# Patient Record
Sex: Female | Born: 1970
Health system: Southern US, Community
[De-identification: ages and names within clinical notes are randomized; demographics above are authoritative.]

## PROBLEM LIST (undated history)

## (undated) DIAGNOSIS — I10 Essential (primary) hypertension: Secondary | ICD-10-CM

## (undated) DIAGNOSIS — F329 Major depressive disorder, single episode, unspecified: Secondary | ICD-10-CM

## (undated) DIAGNOSIS — D649 Anemia, unspecified: Secondary | ICD-10-CM

## (undated) DIAGNOSIS — L732 Hidradenitis suppurativa: Secondary | ICD-10-CM

## (undated) DIAGNOSIS — J45909 Unspecified asthma, uncomplicated: Secondary | ICD-10-CM

## (undated) DIAGNOSIS — E785 Hyperlipidemia, unspecified: Secondary | ICD-10-CM

## (undated) DIAGNOSIS — R7303 Prediabetes: Secondary | ICD-10-CM

## (undated) DIAGNOSIS — N946 Dysmenorrhea, unspecified: Secondary | ICD-10-CM

## (undated) DIAGNOSIS — M1990 Unspecified osteoarthritis, unspecified site: Secondary | ICD-10-CM

## (undated) DIAGNOSIS — F419 Anxiety disorder, unspecified: Secondary | ICD-10-CM

## (undated) DIAGNOSIS — F32A Depression, unspecified: Secondary | ICD-10-CM

## (undated) DIAGNOSIS — K5790 Diverticulosis of intestine, part unspecified, without perforation or abscess without bleeding: Secondary | ICD-10-CM

## (undated) DIAGNOSIS — F410 Panic disorder [episodic paroxysmal anxiety] without agoraphobia: Secondary | ICD-10-CM

## (undated) HISTORY — PX: WISDOM TOOTH EXTRACTION: SHX21

## (undated) HISTORY — DX: Anxiety disorder, unspecified: F41.9

## (undated) HISTORY — DX: Major depressive disorder, single episode, unspecified: F32.9

## (undated) HISTORY — DX: Hyperlipidemia, unspecified: E78.5

## (undated) HISTORY — PX: PELVIC LAPAROSCOPY: SHX162

## (undated) HISTORY — PX: TOE SURGERY: SHX1073

## (undated) HISTORY — PX: OTHER SURGICAL HISTORY: SHX169

## (undated) HISTORY — DX: Dysmenorrhea, unspecified: N94.6

## (undated) HISTORY — DX: Depression, unspecified: F32.A

## (undated) HISTORY — DX: Unspecified osteoarthritis, unspecified site: M19.90

## (undated) HISTORY — DX: Diverticulosis of intestine, part unspecified, without perforation or abscess without bleeding: K57.90

## (undated) HISTORY — PX: FOOT SURGERY: SHX648

## (undated) HISTORY — DX: Hidradenitis suppurativa: L73.2

---
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
## 1998-08-01 ENCOUNTER — Inpatient Hospital Stay (HOSPITAL_COMMUNITY): Admission: AD | Admit: 1998-08-01 | Discharge: 1998-08-01 | Payer: Self-pay | Admitting: Gynecology

## 1998-11-04 ENCOUNTER — Inpatient Hospital Stay (HOSPITAL_COMMUNITY): Admission: AD | Admit: 1998-11-04 | Discharge: 1998-11-04 | Payer: Self-pay | Admitting: Obstetrics and Gynecology

## 1998-12-07 ENCOUNTER — Encounter: Admission: RE | Admit: 1998-12-07 | Discharge: 1998-12-07 | Payer: Self-pay | Admitting: *Deleted

## 1999-05-24 ENCOUNTER — Inpatient Hospital Stay (HOSPITAL_COMMUNITY): Admission: AD | Admit: 1999-05-24 | Discharge: 1999-05-24 | Payer: Self-pay | Admitting: Gynecology

## 1999-12-24 ENCOUNTER — Inpatient Hospital Stay (HOSPITAL_COMMUNITY): Admission: AD | Admit: 1999-12-24 | Discharge: 1999-12-24 | Payer: Self-pay | Admitting: *Deleted

## 2002-01-07 ENCOUNTER — Inpatient Hospital Stay (HOSPITAL_COMMUNITY): Admission: AD | Admit: 2002-01-07 | Discharge: 2002-01-07 | Payer: Self-pay | Admitting: Gynecology

## 2002-03-15 ENCOUNTER — Encounter: Payer: Self-pay | Admitting: Emergency Medicine

## 2002-03-15 ENCOUNTER — Emergency Department (HOSPITAL_COMMUNITY): Admission: EM | Admit: 2002-03-15 | Discharge: 2002-03-15 | Payer: Self-pay | Admitting: Emergency Medicine

## 2003-04-18 ENCOUNTER — Ambulatory Visit (HOSPITAL_COMMUNITY): Admission: RE | Admit: 2003-04-18 | Discharge: 2003-04-18 | Payer: Self-pay | Admitting: Obstetrics and Gynecology

## 2003-10-04 ENCOUNTER — Emergency Department (HOSPITAL_COMMUNITY): Admission: EM | Admit: 2003-10-04 | Discharge: 2003-10-04 | Payer: Self-pay

## 2003-12-08 ENCOUNTER — Inpatient Hospital Stay (HOSPITAL_COMMUNITY): Admission: AD | Admit: 2003-12-08 | Discharge: 2003-12-08 | Payer: Self-pay | Admitting: Family Medicine

## 2004-01-16 ENCOUNTER — Emergency Department (HOSPITAL_COMMUNITY): Admission: EM | Admit: 2004-01-16 | Discharge: 2004-01-16 | Payer: Self-pay

## 2004-05-05 ENCOUNTER — Inpatient Hospital Stay (HOSPITAL_COMMUNITY): Admission: AD | Admit: 2004-05-05 | Discharge: 2004-05-05 | Payer: Self-pay | Admitting: Obstetrics and Gynecology

## 2004-05-06 ENCOUNTER — Inpatient Hospital Stay (HOSPITAL_COMMUNITY): Admission: AD | Admit: 2004-05-06 | Discharge: 2004-05-06 | Payer: Self-pay | Admitting: *Deleted

## 2004-06-20 ENCOUNTER — Emergency Department (HOSPITAL_COMMUNITY): Admission: EM | Admit: 2004-06-20 | Discharge: 2004-06-20 | Payer: Self-pay | Admitting: Emergency Medicine

## 2005-04-09 ENCOUNTER — Inpatient Hospital Stay (HOSPITAL_COMMUNITY): Admission: AD | Admit: 2005-04-09 | Discharge: 2005-04-09 | Payer: Self-pay | Admitting: Obstetrics and Gynecology

## 2005-04-11 ENCOUNTER — Inpatient Hospital Stay (HOSPITAL_COMMUNITY): Admission: AD | Admit: 2005-04-11 | Discharge: 2005-04-11 | Payer: Self-pay | Admitting: Obstetrics and Gynecology

## 2005-09-24 ENCOUNTER — Inpatient Hospital Stay (HOSPITAL_COMMUNITY): Admission: AD | Admit: 2005-09-24 | Discharge: 2005-09-24 | Payer: Self-pay | Admitting: *Deleted

## 2005-09-28 ENCOUNTER — Ambulatory Visit: Payer: Self-pay | Admitting: Nurse Practitioner

## 2005-10-11 ENCOUNTER — Ambulatory Visit: Payer: Self-pay | Admitting: Obstetrics & Gynecology

## 2006-03-06 ENCOUNTER — Ambulatory Visit: Payer: Self-pay | Admitting: Nurse Practitioner

## 2006-03-07 ENCOUNTER — Ambulatory Visit: Payer: Self-pay | Admitting: *Deleted

## 2006-06-13 ENCOUNTER — Emergency Department (HOSPITAL_COMMUNITY): Admission: EM | Admit: 2006-06-13 | Discharge: 2006-06-14 | Payer: Self-pay | Admitting: Emergency Medicine

## 2006-10-05 ENCOUNTER — Other Ambulatory Visit: Admission: RE | Admit: 2006-10-05 | Discharge: 2006-10-05 | Payer: Self-pay | Admitting: Obstetrics & Gynecology

## 2008-02-24 ENCOUNTER — Inpatient Hospital Stay (HOSPITAL_COMMUNITY): Admission: AD | Admit: 2008-02-24 | Discharge: 2008-02-24 | Payer: Self-pay | Admitting: Obstetrics & Gynecology

## 2008-04-09 ENCOUNTER — Ambulatory Visit: Payer: Self-pay | Admitting: Internal Medicine

## 2008-09-03 ENCOUNTER — Ambulatory Visit: Payer: Self-pay | Admitting: Internal Medicine

## 2008-11-20 ENCOUNTER — Inpatient Hospital Stay (HOSPITAL_COMMUNITY): Admission: AD | Admit: 2008-11-20 | Discharge: 2008-11-20 | Payer: Self-pay | Admitting: Obstetrics and Gynecology

## 2008-11-20 ENCOUNTER — Ambulatory Visit: Payer: Self-pay | Admitting: Obstetrics and Gynecology

## 2008-11-25 ENCOUNTER — Ambulatory Visit: Payer: Self-pay | Admitting: Family Medicine

## 2008-11-25 ENCOUNTER — Encounter (INDEPENDENT_AMBULATORY_CARE_PROVIDER_SITE_OTHER): Payer: Self-pay | Admitting: Family Medicine

## 2008-11-25 ENCOUNTER — Encounter (INDEPENDENT_AMBULATORY_CARE_PROVIDER_SITE_OTHER): Payer: Self-pay | Admitting: Internal Medicine

## 2008-11-25 LAB — CONVERTED CEMR LAB
CO2: 25 meq/L (ref 19–32)
Chlamydia, DNA Probe: NEGATIVE
Chloride: 106 meq/L (ref 96–112)
Creatinine, Ser: 0.94 mg/dL (ref 0.40–1.20)
Glucose, Bld: 86 mg/dL (ref 70–99)
Mumps IgG: 1.54 — ABNORMAL HIGH
Potassium: 4.4 meq/L (ref 3.5–5.3)
Rubella: 24.6 intl units/mL — ABNORMAL HIGH
Sodium: 139 meq/L (ref 135–145)
Total Bilirubin: 0.5 mg/dL (ref 0.3–1.2)

## 2008-11-28 ENCOUNTER — Ambulatory Visit: Payer: Self-pay | Admitting: Internal Medicine

## 2009-01-01 ENCOUNTER — Ambulatory Visit: Payer: Self-pay | Admitting: Internal Medicine

## 2009-03-16 ENCOUNTER — Ambulatory Visit: Payer: Self-pay | Admitting: Family Medicine

## 2009-03-24 ENCOUNTER — Ambulatory Visit: Payer: Self-pay | Admitting: Family Medicine

## 2009-05-18 ENCOUNTER — Ambulatory Visit: Payer: Self-pay | Admitting: Internal Medicine

## 2009-08-26 ENCOUNTER — Ambulatory Visit: Payer: Self-pay | Admitting: Internal Medicine

## 2009-08-26 ENCOUNTER — Encounter (INDEPENDENT_AMBULATORY_CARE_PROVIDER_SITE_OTHER): Payer: Self-pay | Admitting: Internal Medicine

## 2009-08-26 LAB — CONVERTED CEMR LAB
Basophils Absolute: 0 10*3/uL (ref 0.0–0.1)
Basophils Relative: 0 % (ref 0–1)
Chlamydia, DNA Probe: NEGATIVE
Eosinophils Absolute: 0.2 10*3/uL (ref 0.0–0.7)
Eosinophils Relative: 4 % (ref 0–5)
GC Probe Amp, Genital: NEGATIVE
HCT: 39.8 % (ref 36.0–46.0)
Hep B E Ab: NEGATIVE
Lymphocytes Relative: 45 % (ref 12–46)
MCHC: 31.9 g/dL (ref 30.0–36.0)
Monocytes Absolute: 0.4 10*3/uL (ref 0.1–1.0)
Monocytes Relative: 8 % (ref 3–12)
Platelets: 212 10*3/uL (ref 150–400)

## 2009-09-02 ENCOUNTER — Ambulatory Visit: Payer: Self-pay | Admitting: Internal Medicine

## 2009-12-21 ENCOUNTER — Emergency Department (HOSPITAL_COMMUNITY): Admission: EM | Admit: 2009-12-21 | Discharge: 2009-12-21 | Payer: Self-pay | Admitting: Emergency Medicine

## 2010-07-02 ENCOUNTER — Ambulatory Visit: Payer: Self-pay | Admitting: Physician Assistant

## 2010-07-02 ENCOUNTER — Inpatient Hospital Stay (HOSPITAL_COMMUNITY): Admission: AD | Admit: 2010-07-02 | Discharge: 2010-07-03 | Payer: Self-pay | Admitting: Obstetrics & Gynecology

## 2010-12-07 ENCOUNTER — Encounter (INDEPENDENT_AMBULATORY_CARE_PROVIDER_SITE_OTHER): Payer: Self-pay | Admitting: *Deleted

## 2010-12-09 ENCOUNTER — Ambulatory Visit (HOSPITAL_COMMUNITY)
Admission: RE | Admit: 2010-12-09 | Discharge: 2010-12-09 | Payer: Self-pay | Source: Home / Self Care | Attending: Family Medicine | Admitting: Family Medicine

## 2011-01-15 ENCOUNTER — Encounter: Payer: Self-pay | Admitting: Internal Medicine

## 2011-03-13 LAB — URINE CULTURE: Colony Count: 10000

## 2011-03-13 LAB — URINALYSIS, ROUTINE W REFLEX MICROSCOPIC
Hgb urine dipstick: NEGATIVE
Urobilinogen, UA: 0.2 mg/dL (ref 0.0–1.0)
pH: 6.5 (ref 5.0–8.0)

## 2011-03-13 LAB — WET PREP, GENITAL

## 2011-05-13 NOTE — Group Therapy Note (Signed)
NAMEKAYREN, HOLCK             ACCOUNT NO.:  [id number]   MEDICAL RECORD NO.:  [id number]          PATIENT TYPE:  WOC   LOCATION:  [facility]                   FACILITY:  [facility]   PHYSICIAN:  Elsie Lincoln, MD      DATE OF BIRTH:  1971-02-01   DATE OF SERVICE:                                    CLINIC NOTE   CHIEF COMPLAINT:  Followup for incision and drainage of a Bartholin's cyst  in [facility] on September 24, 2005.   SUBJECTIVE:  This is a 40 year old who was evaluated and diagnosed with a  sebaceous bulbar abscess on September 24, 2005 with I&D, and patient to  complete Keflex 500 mg q.i.d. prescribed for 5 days' treatment who comes in  today to check on the area, and also complaining of white vaginal discharge.  No itching.  No odor.  No burning sensation to voiding.   ALLERGIES:  Codeine gives her shortness of breath.   IMMUNIZATIONS:  The patient has had chickenpox, and tetanus has been  updated.   GYNECOLOGICAL HISTORY:  Last menstrual period on September 06, 2005.  Menarche at 40 years old with regular cycles.  The patient is not using any  contraceptives at this moment.  She is not sexually active.  Last Pap smear  in 2004.  Patient has a scheduled appointment ____________ tomorrow for a  Pap smear, GC and chlamydia.  Last mammogram in 2004.   OBSTETRICAL HISTORY:  G2, para 0-2-0 and 1 stillborn at 28 weeks'.   FAMILY HISTORY:  Mother with diabetes mellitus.  Father with high blood  pressure.  Grandfather with cancer of pancreas and liver, and grandmother  with a history of blood clots.   OBJECTIVE:  VITAL SIGNS:  Listed on the chart and within normal limits.  GENERAL:  In no acute distress.  Pleasant, well dressed and kept lady.  GENITOURINARY:  External genitalia within normal limits.  There is a small  area on the left thigh lateral to major labia with scar tissue and point of  inundated area without any discharge, slightly tender to palpation, but no  erythema or  discharge.  No drainage.  Wet prep obtained.   ASSESSMENT:  78.  A 40 year old African-American female status post incision and drainage      of bulbar abscess, completing antibiotic treatment.  2.  Bacterial vaginosis.   PLAN:  MetroGel for bacterial vaginosis prescribed since the patient is  intolerant of Flagyl.  Cream to be used 1 application at q.h.s. per vagina  for 5-day course.  Patient instructed to start application tomorrow at night  after Pap smear obtained.  No followup required for cyst.   DICTATED BY:  This is ____________ dictating an office visit for Dr.  Penne Lash.           ______________________________  Elsie Lincoln, MD     KL/MEDQ  D:  10/11/2005  T:  10/11/2005  Job:  [id number]

## 2011-06-17 ENCOUNTER — Inpatient Hospital Stay (HOSPITAL_COMMUNITY)
Admission: AD | Admit: 2011-06-17 | Discharge: 2011-06-17 | Disposition: A | Discharge status: Home / Self Care | Payer: Self-pay | Source: Ambulatory Visit | Attending: Obstetrics & Gynecology | Admitting: Obstetrics & Gynecology

## 2011-06-17 DIAGNOSIS — L732 Hidradenitis suppurativa: Secondary | ICD-10-CM

## 2011-06-17 DIAGNOSIS — A599 Trichomoniasis, unspecified: Secondary | ICD-10-CM | POA: Insufficient documentation

## 2011-06-17 LAB — URINALYSIS, ROUTINE W REFLEX MICROSCOPIC
Hgb urine dipstick: NEGATIVE
Nitrite: NEGATIVE
Urobilinogen, UA: 0.2 mg/dL (ref 0.0–1.0)

## 2011-06-17 LAB — URINE MICROSCOPIC-ADD ON

## 2011-06-17 LAB — POCT PREGNANCY, URINE: Preg Test, Ur: NEGATIVE

## 2011-09-19 LAB — DIFFERENTIAL
Basophils Relative: 0
Eosinophils Relative: 3
Monocytes Absolute: 0.6
Monocytes Relative: 9
Neutro Abs: 3.8

## 2011-09-19 LAB — WET PREP, GENITAL: Yeast Wet Prep HPF POC: NONE SEEN

## 2011-09-19 LAB — URINALYSIS, ROUTINE W REFLEX MICROSCOPIC
Bilirubin Urine: NEGATIVE
Glucose, UA: NEGATIVE
Hgb urine dipstick: NEGATIVE
Specific Gravity, Urine: 1.015
Urobilinogen, UA: 0.2

## 2011-09-19 LAB — CBC
HCT: 35.3 — ABNORMAL LOW
Platelets: 226
RBC: 3.94
RDW: 13.4
WBC: 6.5

## 2011-09-19 LAB — POCT PREGNANCY, URINE
Operator id: 28886
Preg Test, Ur: NEGATIVE

## 2011-11-22 ENCOUNTER — Encounter: Payer: Self-pay | Admitting: *Deleted

## 2011-11-22 ENCOUNTER — Emergency Department (HOSPITAL_COMMUNITY): Payer: Self-pay

## 2011-11-22 ENCOUNTER — Emergency Department (HOSPITAL_COMMUNITY)
Admission: EM | Admit: 2011-11-22 | Discharge: 2011-11-22 | Disposition: A | Discharge status: Home / Self Care | Payer: Self-pay | Attending: Emergency Medicine | Admitting: Emergency Medicine

## 2011-11-22 DIAGNOSIS — R079 Chest pain, unspecified: Secondary | ICD-10-CM | POA: Insufficient documentation

## 2011-11-22 DIAGNOSIS — R0789 Other chest pain: Secondary | ICD-10-CM

## 2011-11-22 DIAGNOSIS — R071 Chest pain on breathing: Secondary | ICD-10-CM | POA: Insufficient documentation

## 2011-11-22 MED ORDER — CYCLOBENZAPRINE HCL 10 MG PO TABS: 5.0000 mg | ORAL_TABLET | Freq: Three times a day (TID) | ORAL | Status: AC | PRN

## 2011-11-22 NOTE — ED Notes (Signed)
Pt reports L side cp on and off x 1 year, was never seen for it.  Pt reports pain started today while driving.  Pt denies any SOB or nausea at present.  Pt reports pain is worse with inhalation and with palpation.

## 2011-11-22 NOTE — ED Provider Notes (Signed)
History     CSN: [id number] Arrival date & time: 11/22/2011  1:18 PM   First MD Initiated Contact with Patient 11/22/11 1425      Chief Complaint  Patient presents with  . Chest Pain    (Consider location/radiation/quality/duration/timing/severity/associated sxs/prior treatment) Patient is a 40 y.o. female presenting with chest pain. The history is provided by the patient.  Chest Pain Episode onset: 1 year ago, with most recent episode lasting for last 2 weeks. Chest pain occurs intermittently. The chest pain is worsening. Associated with: movement, deep breathing. The severity of the pain is severe. The quality of the pain is described as sharp. The pain does not radiate. Chest pain is worsened by certain positions and deep breathing. Pertinent negatives for primary symptoms include no fever, no fatigue, no syncope, no shortness of breath, no cough, no palpitations and no abdominal pain. Primary symptoms comment: a few episodes of N/V when pain is severe but none today  Pertinent negatives for associated symptoms include no claudication, no diaphoresis, no lower extremity edema, no near-syncope, no numbness, no orthopnea, no paroxysmal nocturnal dyspnea and no weakness. Treatments tried: acetaminophen. Risk factors include no known risk factors.  Pertinent negatives for past medical history include no anxiety/panic attacks, no CAD, no diabetes, no DVT, no hyperlipidemia, no hypertension, no PE and no sickle cell disease.  Pertinent negatives for family medical history include: family history of aortic dissection, no early MI in family and no PE in family.   Pt with no recent prolonged road trip, no OCP use, no recent swelling or pain to LE.  History reviewed. No pertinent past medical history.  Past Surgical History  Procedure Date  . Abdominal exploration surgery     No family history on file.  History  Substance Use Topics  . Smoking status: Not on file  . Smokeless tobacco:  Not on file  . Alcohol Use: No     Review of Systems  Constitutional: Negative for fever, diaphoresis and fatigue.  Respiratory: Negative for cough and shortness of breath.   Cardiovascular: Positive for chest pain. Negative for palpitations, orthopnea, claudication, syncope and near-syncope.  Gastrointestinal: Negative for abdominal pain.  Neurological: Negative for weakness and numbness.  All other systems reviewed and are negative.    Allergies  Codeine  Home Medications   Current Outpatient Rx  Name Route Sig Dispense Refill  . ACETAMINOPHEN 650 MG RE SUPP Rectal Place 650 mg rectally every 8 (eight) hours as needed. Pain      . CEPHALEXIN 500 MG PO CAPS Oral Take 500 mg by mouth 4 (four) times daily.      Marland Kitchen VALACYCLOVIR HCL 500 MG PO TABS Oral Take 500 mg by mouth 2 (two) times daily.        BP 118/68  Pulse 66  Temp(Src) 98.4 F (36.9 C) (Oral)  Resp 18  Wt 130 lb (58.968 kg)  SpO2 100%  LMP 11/01/2011  Physical Exam  Nursing note and vitals reviewed. Constitutional: She is oriented to person, place, and time. She appears well-developed and well-nourished. No distress.  HENT:  Head: Normocephalic and atraumatic.  Right Ear: External ear normal.  Left Ear: External ear normal.  Mouth/Throat: Oropharynx is clear and moist.  Eyes: Conjunctivae and EOM are normal. Pupils are equal, round, and reactive to light.  Neck: Normal range of motion. Neck supple. No JVD present.  Cardiovascular: Normal rate, regular rhythm, normal heart sounds and intact distal pulses.   Pulmonary/Chest: Effort normal and  breath sounds normal. No respiratory distress. She exhibits tenderness.  Abdominal: Soft. Bowel sounds are normal. She exhibits no distension. There is no tenderness.  Musculoskeletal: Normal range of motion. She exhibits no edema.       Arms: Neurological: She is alert and oriented to person, place, and time. No cranial nerve deficit. Coordination normal.  Skin: Skin  is warm and dry. No rash noted. She is not diaphoretic.  Psychiatric: She has a normal mood and affect. Her behavior is normal.    ED Course  Procedures (including critical care time)  Labs Reviewed - No data to display Dg Chest 2 View  11/22/2011  *RADIOLOGY REPORT*  Clinical Data: Chest pain.  CHEST - 2 VIEW  Comparison: Chest x-ray 06/13/2006.  Findings: The cardiac silhouette, mediastinal and hilar contours are within normal limits and stable. The lungs are clear.  No pleural effusions.  The bony thorax is intact.  IMPRESSION: Normal chest x-ray.  Original Report Authenticated By: P. Loralie Champagne, M.D.     Date: 11/22/2011  Rate: 65  Rhythm: sinus  QRS Axis: normal  Intervals: normal  ST/T Wave abnormalities: normal  Conduction Disutrbances:Possible RVH with RSR' in V2  Narrative Interpretation:   Old EKG Reviewed: none available    1. Left-sided chest wall pain       MDM  39 year old F with 1 year intermittent sharp left-sided CP. No known CAD risk factors. Pain is reproducible. No hypoxia, tachycardia, recent prolonged trip, or other factors to suggest PE. Suspect musculoskeletal pain, will give rx for muscle relaxer and advise PCP follow-up.        Elwyn Reach [place], [place] 11/22/11 [phone]

## 2011-11-22 NOTE — ED Notes (Signed)
Pt verbalized understanding of plan of care and was walked to the d/c window.

## 2011-11-22 NOTE — ED Notes (Signed)
Pt reports L side upper chest pain x 1 hour today.  Pt reports having this same pain on and off x 1 year and was never seen.  Pt describes pain as sharp shooting pain.  Denies SOB or nausea at this time.  Pt reports pain started while driving.  Pain reports pain is worse when taking a deep breath and with palpation.

## 2011-12-03 NOTE — ED Provider Notes (Signed)
Medical screening examination/treatment/procedure(s) were performed by non-physician practitioner and as supervising physician I was immediately available for consultation/collaboration.   Suzi Roots, MD 12/03/11 [phone]

## 2012-02-23 ENCOUNTER — Other Ambulatory Visit: Payer: Self-pay

## 2012-02-23 ENCOUNTER — Emergency Department (HOSPITAL_COMMUNITY): Payer: Self-pay

## 2012-02-23 ENCOUNTER — Encounter (HOSPITAL_COMMUNITY): Payer: Self-pay | Admitting: Emergency Medicine

## 2012-02-23 ENCOUNTER — Emergency Department (HOSPITAL_COMMUNITY)
Admission: EM | Admit: 2012-02-23 | Discharge: 2012-02-24 | Disposition: A | Discharge status: Home / Self Care | Payer: Self-pay | Attending: Emergency Medicine | Admitting: Emergency Medicine

## 2012-02-23 DIAGNOSIS — J029 Acute pharyngitis, unspecified: Secondary | ICD-10-CM | POA: Insufficient documentation

## 2012-02-23 DIAGNOSIS — R062 Wheezing: Secondary | ICD-10-CM | POA: Insufficient documentation

## 2012-02-23 DIAGNOSIS — R509 Fever, unspecified: Secondary | ICD-10-CM | POA: Insufficient documentation

## 2012-02-23 DIAGNOSIS — J40 Bronchitis, not specified as acute or chronic: Secondary | ICD-10-CM | POA: Insufficient documentation

## 2012-02-23 DIAGNOSIS — R0602 Shortness of breath: Secondary | ICD-10-CM | POA: Insufficient documentation

## 2012-02-23 LAB — DIFFERENTIAL
Basophils Absolute: 0 10*3/uL (ref 0.0–0.1)
Basophils Relative: 0 % (ref 0–1)
Eosinophils Relative: 3 % (ref 0–5)
Lymphocytes Relative: 44 % (ref 12–46)
Neutro Abs: 2.8 10*3/uL (ref 1.7–7.7)

## 2012-02-23 LAB — POCT I-STAT TROPONIN I: Troponin i, poc: 0 ng/mL (ref 0.00–0.08)

## 2012-02-23 LAB — CBC
MCHC: 32 g/dL (ref 30.0–36.0)
Platelets: 284 10*3/uL (ref 150–400)
RDW: 13.2 % (ref 11.5–15.5)
WBC: 6.2 10*3/uL (ref 4.0–10.5)

## 2012-02-23 LAB — POCT I-STAT, CHEM 8
Calcium, Ion: 1.2 mmol/L (ref 1.12–1.32)
Chloride: 108 mEq/L (ref 96–112)
Glucose, Bld: 92 mg/dL (ref 70–99)
HCT: 36 % (ref 36.0–46.0)
Hemoglobin: 12.2 g/dL (ref 12.0–15.0)
Potassium: 3.8 mEq/L (ref 3.5–5.1)

## 2012-02-23 MED ORDER — ALBUTEROL SULFATE (5 MG/ML) 0.5% IN NEBU
2.5000 mg | INHALATION_SOLUTION | Freq: Once | RESPIRATORY_TRACT | Status: AC
  Administered 2012-02-24: 2.5 mg via RESPIRATORY_TRACT
  Filled 2012-02-23: qty 0.5

## 2012-02-23 MED ORDER — IPRATROPIUM BROMIDE 0.02 % IN SOLN
0.5000 mg | Freq: Once | RESPIRATORY_TRACT | Status: AC
  Administered 2012-02-24: 0.5 mg via RESPIRATORY_TRACT
  Filled 2012-02-23: qty 2.5

## 2012-02-23 NOTE — ED Notes (Signed)
Pt alert, nad, c/o cough, sob, onset last week, resp even, unlabored, dry npc noted, exp wheezes noted

## 2012-02-23 NOTE — ED Provider Notes (Addendum)
History     CSN: [id number]  Arrival date & time 02/23/12  2038   First MD Initiated Contact with Patient 02/23/12 2303      Chief Complaint  Patient presents with  . Shortness of Breath  . Cough    (Consider location/radiation/quality/duration/timing/severity/associated sxs/prior treatment) Patient is a 41 y.o. female presenting with shortness of breath, cough, and wheezing. The history is provided by the patient. No language interpreter was used.  Shortness of Breath  The current episode started more than 1 week ago. The onset was gradual. The problem occurs continuously. The problem has been unchanged. The problem is moderate. The symptoms are relieved by nothing. The symptoms are aggravated by nothing. Associated symptoms include a fever, sore throat, cough, shortness of breath and wheezing. Pertinent negatives include no chest pain, no chest pressure and no stridor. There was no intake of a foreign body. She has not inhaled smoke recently. She has had no prior steroid use. She has had no prior hospitalizations. She has had no prior ICU admissions. Her past medical history does not include asthma. She has been behaving normally. Urine output has been normal. The last void occurred less than 6 hours ago. There were sick contacts at home. She has received no recent medical care.  Cough Associated symptoms include sore throat, shortness of breath and wheezing. Pertinent negatives include no chest pain. Her past medical history does not include asthma.  Wheezing  The current episode started more than 1 week ago. The onset was gradual. The problem occurs continuously. The problem has been unchanged. The problem is moderate. The symptoms are relieved by nothing. The symptoms are aggravated by nothing. Associated symptoms include a fever, sore throat, cough, shortness of breath and wheezing. Pertinent negatives include no chest pain, no chest pressure and no stridor. The cough has no precipitants.  The cough is non-productive. There is no color change associated with the cough. Nothing relieves the cough. Nothing worsens the cough. She has been experiencing a severe sore throat. Neither side is more painful than the other. The sore throat is characterized by pain only. There was no intake of a foreign body. She has not inhaled smoke recently. She has had no prior steroid use. She has had no prior hospitalizations. She has had no prior ICU admissions. Her past medical history does not include asthma. She has been behaving normally. Urine output has been normal. The last void occurred less than 6 hours ago. There were sick contacts at home. She has received no recent medical care.    History reviewed. No pertinent past medical history.  Past Surgical History  Procedure Date  . Abdominal exploration surgery     No family history on file.  History  Substance Use Topics  . Smoking status: Not on file  . Smokeless tobacco: Not on file  . Alcohol Use: No    OB History    Grav Para Term Preterm Abortions TAB SAB Ect Mult Living                  Review of Systems  Constitutional: Positive for fever.  HENT: Positive for sore throat.   Eyes: Negative.   Respiratory: Positive for cough, shortness of breath and wheezing. Negative for stridor.   Cardiovascular: Negative for chest pain.  Gastrointestinal: Negative.   Genitourinary: Negative.   Musculoskeletal: Negative.   Neurological: Negative.   Hematological: Negative.   Psychiatric/Behavioral: Negative.     Allergies  Codeine  Home Medications  Current Outpatient Rx  Name Route Sig Dispense Refill  . VALACYCLOVIR HCL 500 MG PO TABS Oral Take 500 mg by mouth 2 (two) times daily.      . CEPHALEXIN 500 MG PO CAPS Oral Take 500 mg by mouth 4 (four) times daily.        BP 118/75  Pulse 72  Temp 98 F (36.7 C)  Resp 16  SpO2 97%  LMP 02/23/2012  Physical Exam  Constitutional: She is oriented to person, place, and  time. She appears well-developed and well-nourished.  HENT:  Head: Normocephalic and atraumatic.  Mouth/Throat: Oropharynx is clear and moist. No oropharyngeal exudate.  Eyes: Conjunctivae are normal. Pupils are equal, round, and reactive to light.  Neck: Normal range of motion. Neck supple. No JVD present. No tracheal deviation present.  Cardiovascular: Normal rate and regular rhythm.   Pulmonary/Chest: No stridor. She has wheezes.  Abdominal: Soft. Bowel sounds are normal. There is no tenderness. There is no rebound and no guarding.  Musculoskeletal: Normal range of motion. She exhibits no edema.  Lymphadenopathy:    She has no cervical adenopathy.  Neurological: She is alert and oriented to person, place, and time.  Skin: Skin is warm and dry. She is not diaphoretic.  Psychiatric: She has a normal mood and affect.  voice quality normal no plummy voice   ED Course  Procedures (including critical care time)   Labs Reviewed  CBC  DIFFERENTIAL  RAPID STREP SCREEN   No results found.   No diagnosis found.   PERC negative MDM   Date: 02/23/2012  Rate:58  Rhythm: normal sinus rhythm  QRS Axis: normal  Intervals: normal  ST/T Wave abnormalities: normal  Conduction Disutrbances:none  Narrative Interpretation:   Old EKG Reviewed: none available    One troponin sufficient as symptoms > 8 hours.  Second I stat troponin and I stat 8 were from a patient across the hall and were entered in error on the patient's chart.  They will be credited and removed.  The patient does not have an elevated glucose    Return for chest pain shortness of breath, difficulty or pain with swallowing change in voice or any concerns.  Patient verbalizes understanding and agrees to follow up Jordan Pardini K Mat Stuard-Rasch, MD 02/24/12 0210  Kasidy Gianino K Iriel Nason-Rasch, MD 02/24/12 [phone]

## 2012-02-24 LAB — POCT I-STAT, CHEM 8
Creatinine, Ser: 1 mg/dL (ref 0.50–1.10)
Glucose, Bld: 262 mg/dL — ABNORMAL HIGH (ref 70–99)
HCT: 35 % — ABNORMAL LOW (ref 36.0–46.0)
Hemoglobin: 11.9 g/dL — ABNORMAL LOW (ref 12.0–15.0)
Potassium: 3.5 mEq/L (ref 3.5–5.1)
TCO2: 27 mmol/L (ref 0–100)

## 2012-02-24 LAB — POCT PREGNANCY, URINE: Preg Test, Ur: NEGATIVE

## 2012-02-24 LAB — RAPID STREP SCREEN (MED CTR MEBANE ONLY): Streptococcus, Group A Screen (Direct): NEGATIVE

## 2012-02-24 MED ORDER — ALBUTEROL SULFATE HFA 108 (90 BASE) MCG/ACT IN AERS: 1.0000 | INHALATION_SPRAY | Freq: Four times a day (QID) | RESPIRATORY_TRACT | Status: DC | PRN

## 2012-02-24 MED ORDER — GI COCKTAIL ~~LOC~~
30.0000 mL | Freq: Once | ORAL | Status: AC
  Administered 2012-02-24: 30 mL via ORAL
  Filled 2012-02-24: qty 30

## 2012-02-24 MED ORDER — AZITHROMYCIN 250 MG PO TABS: 250.0000 mg | ORAL_TABLET | Freq: Every day | ORAL | Status: AC

## 2012-02-24 MED ORDER — KETOROLAC TROMETHAMINE 30 MG/ML IJ SOLN
30.0000 mg | Freq: Once | INTRAMUSCULAR | Status: AC
  Administered 2012-02-24: 30 mg via INTRAVENOUS
  Filled 2012-02-24: qty 1

## 2012-02-24 NOTE — ED Notes (Signed)
CBG 108. 

## 2012-02-24 NOTE — Discharge Instructions (Signed)
Bronchitis     Bronchitis is a problem of the air tubes leading to your lungs. This problem makes it hard for air to get in and out of the lungs. You may cough a lot because your air tubes are narrow. Going without care can cause lasting (chronic) bronchitis.  HOME CARE   · Drink enough fluids to keep your pee (urine) clear or pale yellow.   · Use a cool mist humidifier.   · Quit smoking if you smoke. If you keep smoking, the bronchitis might not get better.   · Only take medicine as told by your doctor.   GET HELP RIGHT AWAY IF:   · Coughing keeps you awake.   · You start to wheeze.   · You become more sick or weak.   · You have a hard time breathing or get short of breath.   · You cough up blood.   · Coughing lasts more than 2 weeks.   · You have a fever.   · Your baby is older than 3 months with a rectal temperature of 102° F (38.9° C) or higher.   · Your baby is 3 months old or younger with a rectal temperature of 100.4° F (38° C) or higher.   MAKE SURE YOU:  · Understand these instructions.   · Will watch your condition.   · Will get help right away if you are not doing well or get worse.   Document Released: 05/30/2008 Document Revised: 08/24/2011 Document Reviewed: 11/13/2009  ExitCare® Patient Information ©2012 [employer].

## 2012-03-01 ENCOUNTER — Inpatient Hospital Stay (HOSPITAL_COMMUNITY)
Admission: AD | Admit: 2012-03-01 | Discharge: 2012-03-01 | Disposition: A | Discharge status: Home / Self Care | Payer: Self-pay | Source: Ambulatory Visit | Attending: Obstetrics & Gynecology | Admitting: Obstetrics & Gynecology

## 2012-03-01 ENCOUNTER — Encounter (HOSPITAL_COMMUNITY): Payer: Self-pay | Admitting: *Deleted

## 2012-03-01 DIAGNOSIS — L732 Hidradenitis suppurativa: Secondary | ICD-10-CM | POA: Insufficient documentation

## 2012-03-01 DIAGNOSIS — L03317 Cellulitis of buttock: Secondary | ICD-10-CM | POA: Insufficient documentation

## 2012-03-01 DIAGNOSIS — L0231 Cutaneous abscess of buttock: Secondary | ICD-10-CM | POA: Insufficient documentation

## 2012-03-01 HISTORY — DX: Panic disorder (episodic paroxysmal anxiety): F41.0

## 2012-03-01 HISTORY — DX: Anemia, unspecified: D64.9

## 2012-03-01 MED ORDER — HYDROMORPHONE HCL PF 1 MG/ML IJ SOLN
1.0000 mg | Freq: Once | INTRAMUSCULAR | Status: AC
  Administered 2012-03-01: 1 mg via INTRAMUSCULAR
  Filled 2012-03-01: qty 1

## 2012-03-01 MED ORDER — HYDROMORPHONE HCL 2 MG PO TABS: 2.0000 mg | ORAL_TABLET | ORAL | Status: AC | PRN

## 2012-03-01 MED ORDER — SULFAMETHOXAZOLE-TRIMETHOPRIM 800-160 MG PO TABS: 1.0000 | ORAL_TABLET | Freq: Two times a day (BID) | ORAL | Status: AC

## 2012-03-01 NOTE — ED Provider Notes (Signed)
Misty L Phifer40 y.o.[id number] @Unknown  Chief Complaint  Patient presents with  . Abscess    SUBJECTIVE  HPI: 5 day history  Past Medical History  Diagnosis Date  . Boils   . Anemia   . Panic attacks    Past Surgical History  Procedure Date  . Abdominal exploration surgery    History   Social History  . Marital Status: Single    Spouse Name: N/A    Number of Children: N/A  . Years of Education: N/A   Occupational History  . Not on file.   Social History Main Topics  . Smoking status: Former Games developer  . Smokeless tobacco: Not on file  . Alcohol Use: No  . Drug Use: No  . Sexually Active:    Other Topics Concern  . Not on file   Social History Narrative  . No narrative on file   No current facility-administered medications on file prior to encounter.   Current Outpatient Prescriptions on File Prior to Encounter  Medication Sig Dispense Refill  . albuterol (PROVENTIL HFA;VENTOLIN HFA) 108 (90 BASE) MCG/ACT inhaler Inhale 1-2 puffs into the lungs every 6 (six) hours as needed for wheezing.  1 Inhaler  0  . azithromycin (ZITHROMAX) 250 MG tablet Take 1 tablet (250 mg total) by mouth daily. Take first 2 tablets together, then 1 every day until finished.  6 tablet  0   Allergies  Allergen Reactions  . Codeine Shortness Of Breath    ROS: Pertinent items in HPI  OBJECTIVE Blood pressure 129/84, pulse 87, temperature 98 F (36.7 C), temperature source Oral, resp. rate 18, height 5\' 1"  (1.549 m), weight 58.968 kg (130 lb), last menstrual period 02/23/2012. GENERAL: Well-developed, well-nourished female in no acute distress.   RESULTS   IMAGING   ASSESSMENT  Rt buttock abscess Suppurative hydranitis PLAN    C/W Dr. Debroah Loop who saw pt.  Rx Bactrim and Rx Dilaudid for breakthrough pain. Continue Tylenol and soaks.

## 2012-03-01 NOTE — Progress Notes (Signed)
Patient states she has a history of recurrent abscesses. Started getting one about 5 days ago that will no start to leak. States she frequently stays at home and waits until they pop and go away. Has taken E-mycin but is out.

## 2012-03-01 NOTE — Discharge Instructions (Signed)
Abscess An abscess (boil or furuncle) is an infected area under your skin. This area is filled with yellowish white fluid (pus). HOME CARE   Only take medicine as told by your doctor.   Keep the skin clean around your abscess. Keep clothes that may touch the abscess clean.   Change any bandages (dressings) as told by your doctor.   Avoid direct skin contact with other people. The infection can spread by skin contact with others.   Practice good hygiene and do not share personal care items.   Do not share athletic equipment, towels, or whirlpools. Shower after every practice or work out session.   If a draining area cannot be covered:   Do not play sports.   Children should not go to daycare until the wound has healed or until fluid (drainage) stops coming out of the wound.   See your doctor for a follow-up visit as told.  GET HELP RIGHT AWAY IF:   There is more pain, puffiness (swelling), and redness in the wound site.   There is fluid or bleeding from the wound site.   You have muscle aches, chills, fever, or feel sick.   You or your child has a temperature by mouth above 102 F (38.9 C), not controlled by medicine.   Your baby is older than 3 months with a rectal temperature of 102 F (38.9 C) or higher.  MAKE SURE YOU:   Understand these instructions.   Will watch your condition.   Will get help right away if you are not doing well or get worse.  Document Released: 05/30/2008 Document Revised: 12/01/2011 Document Reviewed: 05/30/2008 ExitCare Patient Information 2012 [employer]. 

## 2012-09-12 ENCOUNTER — Encounter (HOSPITAL_COMMUNITY): Payer: Self-pay | Admitting: *Deleted

## 2012-09-12 ENCOUNTER — Emergency Department (HOSPITAL_COMMUNITY)
Admission: EM | Admit: 2012-09-12 | Discharge: 2012-09-13 | Disposition: A | Discharge status: Home / Self Care | Payer: Self-pay | Attending: Emergency Medicine | Admitting: Emergency Medicine

## 2012-09-12 ENCOUNTER — Emergency Department (HOSPITAL_COMMUNITY): Payer: Self-pay

## 2012-09-12 DIAGNOSIS — H9209 Otalgia, unspecified ear: Secondary | ICD-10-CM | POA: Insufficient documentation

## 2012-09-12 DIAGNOSIS — R51 Headache: Secondary | ICD-10-CM | POA: Insufficient documentation

## 2012-09-12 DIAGNOSIS — R05 Cough: Secondary | ICD-10-CM | POA: Insufficient documentation

## 2012-09-12 DIAGNOSIS — J029 Acute pharyngitis, unspecified: Secondary | ICD-10-CM | POA: Insufficient documentation

## 2012-09-12 DIAGNOSIS — J069 Acute upper respiratory infection, unspecified: Secondary | ICD-10-CM | POA: Insufficient documentation

## 2012-09-12 DIAGNOSIS — J209 Acute bronchitis, unspecified: Secondary | ICD-10-CM

## 2012-09-12 DIAGNOSIS — R059 Cough, unspecified: Secondary | ICD-10-CM | POA: Insufficient documentation

## 2012-09-12 DIAGNOSIS — J3489 Other specified disorders of nose and nasal sinuses: Secondary | ICD-10-CM | POA: Insufficient documentation

## 2012-09-12 MED ORDER — ALBUTEROL SULFATE (5 MG/ML) 0.5% IN NEBU
5.0000 mg | INHALATION_SOLUTION | Freq: Once | RESPIRATORY_TRACT | Status: AC
  Administered 2012-09-12: 5 mg via RESPIRATORY_TRACT
  Filled 2012-09-12: qty 1

## 2012-09-12 MED ORDER — IPRATROPIUM BROMIDE 0.02 % IN SOLN
0.5000 mg | Freq: Once | RESPIRATORY_TRACT | Status: AC
  Administered 2012-09-12: 0.5 mg via RESPIRATORY_TRACT
  Filled 2012-09-12: qty 2.5

## 2012-09-12 NOTE — ED Notes (Signed)
Pt c/o sore throat; ear ache; cough x 6 days

## 2012-09-12 NOTE — ED Provider Notes (Signed)
History     CSN: [id number]  Arrival date & time 09/12/12  2006   First MD Initiated Contact with Patient 09/12/12 2308      Chief Complaint  Patient presents with  . URI    (Consider location/radiation/quality/duration/timing/severity/associated sxs/prior treatment) HPI Comments: Misty Bailey presents for evaluation of cough, rhinorrhea, earache, and a "scratchy throat" that has persisted over the last week.  She states she has several coworkers with similar symptoms during the same time period.  She became concerned because the symptoms have not improved.  Patient is a 41 y.o. female presenting with cough. The history is provided by the patient. No language interpreter was used.  Cough This is a new problem. Episode onset: 7 days. The problem occurs constantly. The problem has not changed since onset.The cough is non-productive. There has been no fever. Associated symptoms include chills, sweats, ear pain, headaches, rhinorrhea and sore throat. Pertinent negatives include no chest pain, no weight loss, no ear congestion, no myalgias, no shortness of breath, no wheezing and no eye redness. She has tried nothing for the symptoms. She is not a smoker. Her past medical history does not include bronchitis, pneumonia, bronchiectasis, COPD, emphysema or asthma.    Past Medical History  Diagnosis Date  . Boils   . Anemia   . Panic attacks     Past Surgical History  Procedure Date  . Abdominal exploration surgery     Family History  Problem Relation Age of Onset  . Diabetes Mother   . Hypertension Mother   . Hyperlipidemia Mother   . Hypertension Father   . Stroke Father   . Hyperlipidemia Father   . Hypertension Maternal Grandmother     History  Substance Use Topics  . Smoking status: Former Games developer  . Smokeless tobacco: Not on file  . Alcohol Use: No    OB History    Grav Para Term Preterm Abortions TAB SAB Ect Mult Living   2 1  1 1  1    0      Review of Systems    Constitutional: Positive for chills. Negative for fever, weight loss, diaphoresis, activity change, appetite change and fatigue.  HENT: Positive for ear pain, congestion, sore throat, rhinorrhea and postnasal drip. Negative for hearing loss, nosebleeds, facial swelling, sneezing, drooling, mouth sores, trouble swallowing, neck pain, neck stiffness, sinus pressure, tinnitus and ear discharge.   Eyes: Negative for pain, discharge and redness.  Respiratory: Positive for cough. Negative for chest tightness, shortness of breath and wheezing.   Cardiovascular: Negative for chest pain, palpitations and leg swelling.  Gastrointestinal: Negative for nausea, vomiting, abdominal pain, diarrhea and abdominal distention.  Genitourinary: Negative for dysuria, urgency, frequency, hematuria, flank pain, difficulty urinating and pelvic pain.  Musculoskeletal: Negative for myalgias, back pain and arthralgias.  Skin: Negative for color change, pallor, rash and wound.  Neurological: Positive for headaches. Negative for dizziness, tremors, syncope, weakness and light-headedness.  Hematological: Negative.   Psychiatric/Behavioral: Negative.     Allergies  Codeine  Home Medications   Current Outpatient Rx  Name Route Sig Dispense Refill  . ALBUTEROL SULFATE HFA 108 (90 BASE) MCG/ACT IN AERS Inhalation Inhale 1-2 puffs into the lungs every 6 (six) hours as needed for wheezing. 1 Inhaler 0  . NYQUIL COLD & FLU PO Oral Take 1 tablet by mouth daily as needed. cough      BP 119/88  Pulse 99  Temp 98.4 F (36.9 C)  Resp 20  SpO2 99%  LMP 08/11/2012  Physical Exam  Constitutional: She is oriented to person, place, and time. She appears well-developed and well-nourished. No distress. She is not intubated.  HENT:  Head: Normocephalic and atraumatic.  Right Ear: External ear normal.  Left Ear: External ear normal.  Nose: Nose normal.  Mouth/Throat: Oropharynx is clear and moist. No oropharyngeal exudate.   Eyes: Conjunctivae normal and EOM are normal. Pupils are equal, round, and reactive to light. Right eye exhibits no discharge. Left eye exhibits no discharge. No scleral icterus.  Neck: Normal range of motion. Neck supple. No JVD present. No tracheal deviation present.  Cardiovascular: Normal rate, regular rhythm, S1 normal, S2 normal, normal heart sounds, intact distal pulses and normal pulses.   No extrasystoles are present. PMI is not displaced.  Exam reveals no gallop, no distant heart sounds, no friction rub and no decreased pulses.   No murmur heard. Pulmonary/Chest: Effort normal. No accessory muscle usage or stridor. No apnea, not tachypneic and not bradypneic. She is not intubated. No respiratory distress. She has no decreased breath sounds. She has no wheezes. She has no rhonchi. She has no rales.       = dry constant cough, coarse breath sounds without rales, rhonchi, or focal consolidation.  Abdominal: Soft. Bowel sounds are normal. She exhibits no distension and no mass. There is no tenderness. There is no rebound and no guarding.  Musculoskeletal: Normal range of motion. She exhibits no edema and no tenderness.  Lymphadenopathy:    She has no cervical adenopathy.  Neurological: She is alert and oriented to person, place, and time.  Skin: Skin is warm and dry. No rash noted. She is not diaphoretic. No erythema. No pallor.  Psychiatric: She has a normal mood and affect. Her behavior is normal.    ED Course  Procedures (including critical care time)  Labs Reviewed - No data to display No results found.   No diagnosis found.    MDM  Pt presents for evaluation of a nonproductive cough, chills, runny nose, and earache x 1 week.  Pt coughing constantly during exam but no respiratory insufficiency or disetress noted.  She appears nontoxic, NAD.  Plan u-preg, CXR, duoneb, reassess.  0245.  Pt stable, NAD.  Neg rapid strep.  No consolidation observed on CXR.  Plan symptomatic care.   Hx and exam consistent with a viral URI and acute bronchitis with bronchospasm.  Plan prn albuterol, tessalon, mucinex, and outpt f/u      Tobin Chad, MD 09/13/12 [phone]

## 2012-09-12 NOTE — ED Notes (Signed)
Pt sts she has been sick for one week, c/o cough, congestion, left ear ache, back ache and sore throat since Thursday and chills. Patient sts she has small amounts of mucous at times, colored yellow and clear. Patient sts she has been taking nyquil at night and nothing during the day due to it making her sleepy.

## 2012-09-13 LAB — RAPID STREP SCREEN (MED CTR MEBANE ONLY): Streptococcus, Group A Screen (Direct): NEGATIVE

## 2012-09-13 MED ORDER — ALBUTEROL SULFATE HFA 108 (90 BASE) MCG/ACT IN AERS
2.0000 | INHALATION_SPRAY | Freq: Once | RESPIRATORY_TRACT | Status: AC
  Administered 2012-09-13: 2 via RESPIRATORY_TRACT
  Filled 2012-09-13: qty 6.7

## 2012-09-13 MED ORDER — BENZONATATE 100 MG PO CAPS: 200.0000 mg | ORAL_CAPSULE | Freq: Two times a day (BID) | ORAL | Status: DC | PRN

## 2012-09-13 MED ORDER — GUAIFENESIN ER 600 MG PO TB12: 600.0000 mg | ORAL_TABLET | Freq: Two times a day (BID) | ORAL | Status: DC

## 2012-12-23 ENCOUNTER — Emergency Department (HOSPITAL_COMMUNITY)
Admission: EM | Admit: 2012-12-23 | Discharge: 2012-12-23 | Disposition: A | Discharge status: Home / Self Care | Payer: Self-pay | Attending: Emergency Medicine | Admitting: Emergency Medicine

## 2012-12-23 DIAGNOSIS — J111 Influenza due to unidentified influenza virus with other respiratory manifestations: Secondary | ICD-10-CM | POA: Insufficient documentation

## 2012-12-23 DIAGNOSIS — Z862 Personal history of diseases of the blood and blood-forming organs and certain disorders involving the immune mechanism: Secondary | ICD-10-CM | POA: Insufficient documentation

## 2012-12-23 DIAGNOSIS — B349 Viral infection, unspecified: Secondary | ICD-10-CM

## 2012-12-23 DIAGNOSIS — B9789 Other viral agents as the cause of diseases classified elsewhere: Secondary | ICD-10-CM | POA: Insufficient documentation

## 2012-12-23 DIAGNOSIS — Z8659 Personal history of other mental and behavioral disorders: Secondary | ICD-10-CM | POA: Insufficient documentation

## 2012-12-23 DIAGNOSIS — Z87891 Personal history of nicotine dependence: Secondary | ICD-10-CM | POA: Insufficient documentation

## 2012-12-23 MED ORDER — BENZONATATE 100 MG PO CAPS
200.0000 mg | ORAL_CAPSULE | Freq: Once | ORAL | Status: AC
  Administered 2012-12-23: 200 mg via ORAL
  Filled 2012-12-23: qty 2

## 2012-12-23 MED ORDER — BENZONATATE 200 MG PO CAPS: 200.0000 mg | ORAL_CAPSULE | Freq: Three times a day (TID) | ORAL | Status: DC | PRN

## 2012-12-23 MED ORDER — GUAIFENESIN 100 MG/5ML PO SOLN
5.0000 mL | Freq: Once | ORAL | Status: AC
  Administered 2012-12-23: 100 mg via ORAL
  Filled 2012-12-23: qty 5

## 2012-12-23 MED ORDER — GUAIFENESIN 100 MG/5ML PO LIQD: 100.0000 mg | ORAL | Status: DC | PRN

## 2012-12-23 NOTE — ED Provider Notes (Signed)
History     CSN: [id number]  Arrival date & time 12/23/12  1352   First MD Initiated Contact with Patient 12/23/12 1435      Chief Complaint  Patient presents with  . Cough  . Influenza    (Consider location/radiation/quality/duration/timing/severity/associated sxs/prior treatment) HPI  41 year old female presents with uri sxs.  Pt reports gradual onset of fever, chills, headache, runny nose, sore throat, sneeze, cough, myalgias, not relieved with nyquil.  Onset has been ongoing for 1 week.  She reports having diarrhea of >10 bouts for 2 days but that has resolved for several days. Her cough is persistent, and her sxs has not improved.  Pt is not a smoker.  Report no trouble swallowing, n/v, hemoptysis, abd pain, back pain, dysuria or rash. No sick contact.    Past Medical History  Diagnosis Date  . Boils   . Anemia   . Panic attacks     Past Surgical History  Procedure Date  . Abdominal exploration surgery     Family History  Problem Relation Age of Onset  . Diabetes Mother   . Hypertension Mother   . Hyperlipidemia Mother   . Hypertension Father   . Stroke Father   . Hyperlipidemia Father   . Hypertension Maternal Grandmother     History  Substance Use Topics  . Smoking status: Former Games developer  . Smokeless tobacco: Not on file  . Alcohol Use: No    OB History    Grav Para Term Preterm Abortions TAB SAB Ect Mult Living   2 1  1 1  1    0      Review of Systems  Constitutional: Negative for fever.  HENT: Positive for congestion, sore throat, rhinorrhea, sneezing, postnasal drip and sinus pressure. Negative for ear pain and trouble swallowing.   Respiratory: Positive for cough and shortness of breath. Negative for chest tightness.   Cardiovascular: Negative for chest pain.  Gastrointestinal: Negative for abdominal pain.  Skin: Negative for rash.  Neurological: Positive for headaches.    Allergies  Codeine  Home Medications  No current outpatient  prescriptions on file.  BP 134/91  Pulse 99  Temp 98.7 F (37.1 C) (Oral)  Resp 18  SpO2 96%  Physical Exam  Nursing note and vitals reviewed. Constitutional: She is oriented to person, place, and time. She appears well-developed and well-nourished. She appears distressed (uncomfortable, coughing.  ).  HENT:  Head: Normocephalic and atraumatic.  Right Ear: Tympanic membrane and external ear normal.  Left Ear: Tympanic membrane and external ear normal.  Nose: Mucosal edema and rhinorrhea present.  Mouth/Throat: Uvula is midline and mucous membranes are normal. Posterior oropharyngeal erythema present.  Eyes: Conjunctivae normal are normal.  Cardiovascular: Normal rate and regular rhythm.   Pulmonary/Chest: Effort normal and breath sounds normal. No respiratory distress. She has no wheezes.  Abdominal: Soft. There is no tenderness.  Musculoskeletal: Normal range of motion. She exhibits no edema.  Neurological: She is alert and oriented to person, place, and time.  Skin: Skin is warm. No rash noted.  Psychiatric: She has a normal mood and affect.    ED Course  Procedures (including critical care time)  Labs Reviewed - No data to display No results found.   No diagnosis found.  1. URI  MDM  URI sxs x 1 week.  Lung CTAB.  Is afebrile, VSS.  Will treat symptomatically.    BP 134/91  Pulse 99  Temp 98.7 F (37.1 C) (Oral)  Resp 18  SpO2 96%         Fayrene Helper, PA-C 12/23/12 1457

## 2012-12-23 NOTE — ED Provider Notes (Signed)
Medical screening examination/treatment/procedure(s) were performed by non-physician practitioner and as supervising physician I was immediately available for consultation/collaboration.  Christopher J. Pollina, MD 12/23/12 1547 

## 2012-12-23 NOTE — ED Notes (Signed)
Pt reports flu like symptoms x7 days. Reports fever, chills, body aches, nausea, diarrhea, chest pain from coughing. Hx of panic attacks.

## 2013-03-28 ENCOUNTER — Other Ambulatory Visit (HOSPITAL_COMMUNITY)
Admission: RE | Admit: 2013-03-28 | Discharge: 2013-03-28 | Disposition: A | Discharge status: Home / Self Care | Payer: Self-pay | Source: Ambulatory Visit | Attending: Internal Medicine | Admitting: Internal Medicine

## 2013-03-28 ENCOUNTER — Encounter (HOSPITAL_COMMUNITY): Payer: Self-pay

## 2013-03-28 ENCOUNTER — Emergency Department (HOSPITAL_COMMUNITY)
Admission: EM | Admit: 2013-03-28 | Discharge: 2013-03-28 | Disposition: A | Discharge status: Home Health | Payer: Self-pay | Source: Home / Self Care

## 2013-03-28 DIAGNOSIS — N760 Acute vaginitis: Secondary | ICD-10-CM | POA: Insufficient documentation

## 2013-03-28 DIAGNOSIS — Z113 Encounter for screening for infections with a predominantly sexual mode of transmission: Secondary | ICD-10-CM | POA: Insufficient documentation

## 2013-03-28 DIAGNOSIS — B373 Candidiasis of vulva and vagina: Secondary | ICD-10-CM

## 2013-03-28 LAB — URINALYSIS, ROUTINE W REFLEX MICROSCOPIC
Ketones, ur: NEGATIVE mg/dL
Leukocytes, UA: NEGATIVE
Nitrite: NEGATIVE
Specific Gravity, Urine: 1.025 (ref 1.005–1.030)
pH: 6.5 (ref 5.0–8.0)

## 2013-03-28 LAB — PREGNANCY, URINE: Preg Test, Ur: NEGATIVE

## 2013-03-28 MED ORDER — FLUCONAZOLE 200 MG PO TABS: 200.0000 mg | ORAL_TABLET | Freq: Every day | ORAL | Status: DC

## 2013-03-28 NOTE — ED Notes (Signed)
Patient Demographics  Misty Bailey, is a 42 y.o. female  BMW:[id number]  UVO:[id number]  DOB - Aug 06, 1971  Chief Complaint  Patient presents with  . Vaginitis        Subjective:   Misty Bailey today he is to follow for 6 month history of thick white vaginal discharge, no itching are full smell, she sexually active only with her husband, does not wear protection, no pelvic or abdominal pain or discomfort, no fever chills.  Objective:   Past Medical History  Diagnosis Date  . Boils   . Anemia   . Panic attacks       Past Surgical History  Procedure Laterality Date  . Abdominal exploration surgery       Filed Vitals:   03/28/13 1657  BP: 137/78  Pulse: 68  Temp: 98 F (36.7 C)  TempSrc: Oral  SpO2: 100%     Exam  Awake Alert, Oriented X 3, No new F.N deficits, Normal affect [place].AT,PERRAL Supple Neck,No JVD, No cervical lymphadenopathy appriciated.  Symmetrical Chest wall movement, Good air movement bilaterally, CTAB RRR,No Gallops,Rubs or new Murmurs, No Parasternal Heave +ve B.Sounds, Abd Soft, Non tender, No organomegaly appriciated, No rebound - guarding or rigidity. No Cyanosis, Clubbing or edema, No new Rash or bruise  Pelvic exam reveals thick white cheesy discharge, no foul smell, no cervical motion tenderness on pelvic exam, no abnormal masses palpated on bimanual exam.    Data Review   CBC No results found for this basename: WBC, HGB, HCT, PLT, MCV, MCH, MCHC, RDW, NEUTRABS, LYMPHSABS, MONOABS, EOSABS, BASOSABS, BANDABS, BANDSABD,  in the last 168 hours  Chemistries   No results found for this basename: NA, K, CL, CO2, GLUCOSE, BUN, CREATININE, GFRCGP, CALCIUM, MG, AST, ALT, ALKPHOS, BILITOT,  in the last 168 hours ------------------------------------------------------------------------------------------------------------------ No results found for this basename: HGBA1C,  in the last 72  hours ------------------------------------------------------------------------------------------------------------------ No results found for this basename: CHOL, HDL, LDLCALC, TRIG, CHOLHDL, LDLDIRECT,  in the last 72 hours ------------------------------------------------------------------------------------------------------------------ No results found for this basename: TSH, T4TOTAL, FREET3, T3FREE, THYROIDAB,  in the last 72 hours ------------------------------------------------------------------------------------------------------------------ No results found for this basename: VITAMINB12, FOLATE, FERRITIN, TIBC, IRON, RETICCTPCT,  in the last 72 hours  Coagulation profile  No results found for this basename: INR, PROTIME,  in the last 168 hours     Prior to Admission medications   Medication Sig Start Date End Date Taking? Authorizing Provider  benzonatate (TESSALON) 200 MG capsule Take 1 capsule (200 mg total) by mouth 3 (three) times daily as needed for cough. 12/23/12   Fayrene Helper, PA-C  guaiFENesin (ROBITUSSIN) 100 MG/5ML liquid Take 5-10 mLs (100-200 mg total) by mouth every 4 (four) hours as needed for cough. 12/23/12   Fayrene Helper, PA-C     Assessment & Plan   Symptoms of vaginitis for the last 6 months. On exam white cheesy discharge noted, no foul smell, likely candida vaginitis. Patient given Diflucan. Urine pregnancy is negative, UA, urine culture, GC, chlamydia, trich and Gardnerella pending patient requested to call back in couple of days to get results. Also her Pap smear is due she will follow with the health department in a few weeks to get her Pap smear done.    Follow-up Information   Follow up with Health department for PAP smear. Schedule an appointment as soon as possible for a visit in 1 week. (please set the appointement for the patient)       Follow up with this clinic. Schedule an  appointment as soon as possible for a visit in 1 week. (Call back in 2 days  to get your urine culture, cervical culture results)        Leroy Sea M.D on 03/28/2013 at 5:29 PM   Leroy Sea, MD 03/28/13 [phone]

## 2013-03-28 NOTE — ED Notes (Signed)
Patient complains of vaginal discharge that has gotten worse past couple of weeks

## 2013-03-30 LAB — URINE CULTURE

## 2013-06-07 NOTE — Progress Notes (Signed)
Quick Note:  Please hava patient come back for follow up on ? Candida vaginal infection ______

## 2013-06-13 ENCOUNTER — Telehealth: Payer: Self-pay

## 2013-06-13 NOTE — Telephone Encounter (Signed)
Left message with her mom to have her call office so we can schedule her an appt

## 2013-08-01 ENCOUNTER — Ambulatory Visit: Payer: Self-pay | Admitting: Family Medicine

## 2013-08-01 ENCOUNTER — Ambulatory Visit: Payer: Self-pay

## 2013-08-08 ENCOUNTER — Ambulatory Visit: Payer: Self-pay | Attending: Family Medicine

## 2013-08-09 ENCOUNTER — Encounter: Payer: Self-pay | Admitting: Internal Medicine

## 2013-08-09 ENCOUNTER — Ambulatory Visit: Payer: Self-pay | Attending: Internal Medicine | Admitting: Internal Medicine

## 2013-08-09 VITALS — BP 118/77 | HR 65 | Temp 98.7°F | Ht 61.0 in | Wt 140.2 lb

## 2013-08-09 DIAGNOSIS — R350 Frequency of micturition: Secondary | ICD-10-CM | POA: Insufficient documentation

## 2013-08-09 DIAGNOSIS — Z124 Encounter for screening for malignant neoplasm of cervix: Secondary | ICD-10-CM

## 2013-08-09 DIAGNOSIS — R3915 Urgency of urination: Secondary | ICD-10-CM | POA: Insufficient documentation

## 2013-08-09 DIAGNOSIS — Z0000 Encounter for general adult medical examination without abnormal findings: Secondary | ICD-10-CM | POA: Insufficient documentation

## 2013-08-09 DIAGNOSIS — N898 Other specified noninflammatory disorders of vagina: Secondary | ICD-10-CM | POA: Insufficient documentation

## 2013-08-09 LAB — COMPLETE METABOLIC PANEL WITH GFR
AST: 12 U/L (ref 0–37)
Alkaline Phosphatase: 67 U/L (ref 39–117)
GFR, Est Non African American: 70 mL/min
Glucose, Bld: 90 mg/dL (ref 70–99)
Sodium: 137 mEq/L (ref 135–145)
Total Bilirubin: 0.7 mg/dL (ref 0.3–1.2)
Total Protein: 6.5 g/dL (ref 6.0–8.3)

## 2013-08-09 LAB — CBC WITH DIFFERENTIAL/PLATELET
HCT: 39.5 % (ref 36.0–46.0)
Hemoglobin: 12.7 g/dL (ref 12.0–15.0)
Lymphocytes Relative: 39 % (ref 12–46)
MCHC: 32.2 g/dL (ref 30.0–36.0)
Monocytes Absolute: 0.5 10*3/uL (ref 0.1–1.0)
Monocytes Relative: 9 % (ref 3–12)
Neutro Abs: 2.5 10*3/uL (ref 1.7–7.7)
WBC: 5.2 10*3/uL (ref 4.0–10.5)

## 2013-08-09 MED ORDER — METRONIDAZOLE 0.75 % VA GEL: 1.0000 | Freq: Two times a day (BID) | VAGINAL | Status: DC

## 2013-08-09 MED ORDER — AZITHROMYCIN 500 MG PO TABS: 2000.0000 mg | ORAL_TABLET | Freq: Once | ORAL | Status: DC

## 2013-08-09 MED ORDER — FLUCONAZOLE 150 MG PO TABS: 150.0000 mg | ORAL_TABLET | Freq: Once | ORAL | Status: DC

## 2013-08-09 NOTE — Progress Notes (Signed)
Patient ID: Misty Bailey, female   DOB: August 23, 1971, 42 y.o.   MRN: [id number]  CC: Vaginal discharge  HPI: Misty Bailey is a 42 year old African American female with history of anemia and panic attacks presents to the clinic with c/o of excessive vaginal "milky" discharge and lower abdominal pressure for the past week.  She has associated urinary urgency and frequency.  Nothing aggravates or alleviates symptoms.   Allergies  Allergen Reactions  . Codeine Shortness Of Breath   Past Medical History  Diagnosis Date  . Boils   . Anemia   . Panic attacks    No current outpatient prescriptions on file prior to visit.   No current facility-administered medications on file prior to visit.   Family History  Problem Relation Age of Onset  . Diabetes Mother   . Hypertension Mother   . Hyperlipidemia Mother   . Hypertension Father   . Stroke Father   . Hyperlipidemia Father   . Hypertension Maternal Grandmother    History   Social History  . Marital Status: Single    Spouse Name: N/A    Number of Children: N/A  . Years of Education: N/A   Occupational History  . Not on file.   Social History Main Topics  . Smoking status: Former Games developer  . Smokeless tobacco: Not on file  . Alcohol Use: No  . Drug Use: No  . Sexual Activity:    Other Topics Concern  . Not on file   Social History Narrative  . No narrative on file    Review of Systems  Constitutional: Negative for fever, chills, diaphoresis, activity change, appetite change and fatigue.  HENT: Negative for ear pain, nosebleeds, congestion, facial swelling, rhinorrhea, neck pain, neck stiffness and ear discharge.   Eyes: Negative for pain, discharge, redness, itching and visual disturbance.  Respiratory: Negative for cough, choking, chest tightness, shortness of breath, wheezing and stridor.  Cardiovascular: Negative for chest pain, palpitations and leg swelling.  Gastrointestinal: Negative for abdominal  distention.  Genitourinary: She has urgency, frequency. Negative for dysuria, hematuria, flank pain, decreased urine volume, difficulty urinating and dyspareunia.  Musculoskeletal: Negative for back pain, joint swelling, arthralgias and gait problem. Neurological: Negative for dizziness, tremors, seizures, syncope, facial asymmetry, speech difficulty, weakness, light-headedness, numbness and headaches.  Hematological: Negative for adenopathy. Does not bruise/bleed easily.  Psychiatric/Behavioral: Negative for hallucinations, behavioral problems, confusion, dysphoric mood, decreased concentration and agitation.   Objective:   Filed Vitals:   08/09/13 0957  BP: 118/77  Pulse: 65  Temp: 98.7 F (37.1 C)    Physical Exam  Constitutional: Appears well-developed and well-nourished. No distress.  HENT: Normocephalic. External right and left ear normal. Oropharynx is clear and moist.  Eyes: Conjunctivae and EOM are normal. PERRLA, no scleral icterus.  Neck: Normal ROM. Neck supple. No JVD. No tracheal deviation. No thyromegaly.  CVS: RRR, S1/S2 +, no murmurs, no gallops, no carotid bruit.  Pulmonary: Effort and breath sounds normal, no stridor, rhonchi, wheezes, rales.  Abdominal: Soft. BS +,  no distension, tenderness, rebound or guarding Pelvic exam:  Normal external genitalia, white milky discharge, no cervical lesions. Biman: no adnexal tenderness, no CMT, and no masses palpated Musculoskeletal: Normal range of motion. No edema and no tenderness.  Lymphadenopathy: No lymphadenopathy noted, cervical, inguinal. Neuro: Alert. Normal reflexes, muscle tone coordination. No cranial nerve deficit. Skin: Skin is warm and dry. No rash noted. Not diaphoretic. No erythema. No pallor.  Psychiatric: Normal mood and affect. Behavior,  judgment, thought content normal.  Lab Results  Component Value Date   WBC 6.2 02/23/2012   HGB 11.9* 02/24/2012   HCT 35.0* 02/24/2012   MCV 89.5 02/23/2012   PLT 284  02/23/2012   Lab Results  Component Value Date   CREATININE 1.00 02/24/2012   BUN 10 02/24/2012   NA 139 02/24/2012   K 3.5 02/24/2012   CL 100 02/24/2012   CO2 25 11/25/2008    No results found for this basename: HGBA1C   Lipid Panel  No results found for this basename: chol, trig, hdl, cholhdl, vldl, ldlcalc       Assessment and plan:    Vaginal Discharge Plan:  Papsmear to include STD testing and [employer] to include CBCD, CMP, TSH, HbA1C  Prescribed Metronidazole gel as patient doesn't tolerate well oral metronidazole  Zithromax 1 g by mouth at once  Fluconazole 150 mg tab by mouth once  Misty Bailey was given clear instructions to go to ER or return to the clinic if symptoms don't improve, worsen or new problems develop.  Misty Bailey verbalized understanding.  Misty Bailey was told to call to get lab results if hasn't heard anything in the next week.       Jeanann Lewandowsky, MD

## 2013-08-10 LAB — URINALYSIS W MICROSCOPIC + REFLEX CULTURE
Casts: NONE SEEN
Crystals: NONE SEEN
Leukocytes, UA: NEGATIVE
Nitrite: NEGATIVE
Specific Gravity, Urine: 1.022 (ref 1.005–1.030)
pH: 7.5 (ref 5.0–8.0)

## 2013-08-13 LAB — T4 AND TSH: T4, Total: 7.8 ug/dL (ref 4.5–12.0)

## 2014-01-03 IMAGING — CR DG CHEST 2V
2 series · 2 of 2 positions shown · non-contrast
Comparison: PA and lateral chest 02/23/2012.

CLINICAL DATA: Shortness of breath and cough.

CHEST - 2 VIEW

[w chest pa]
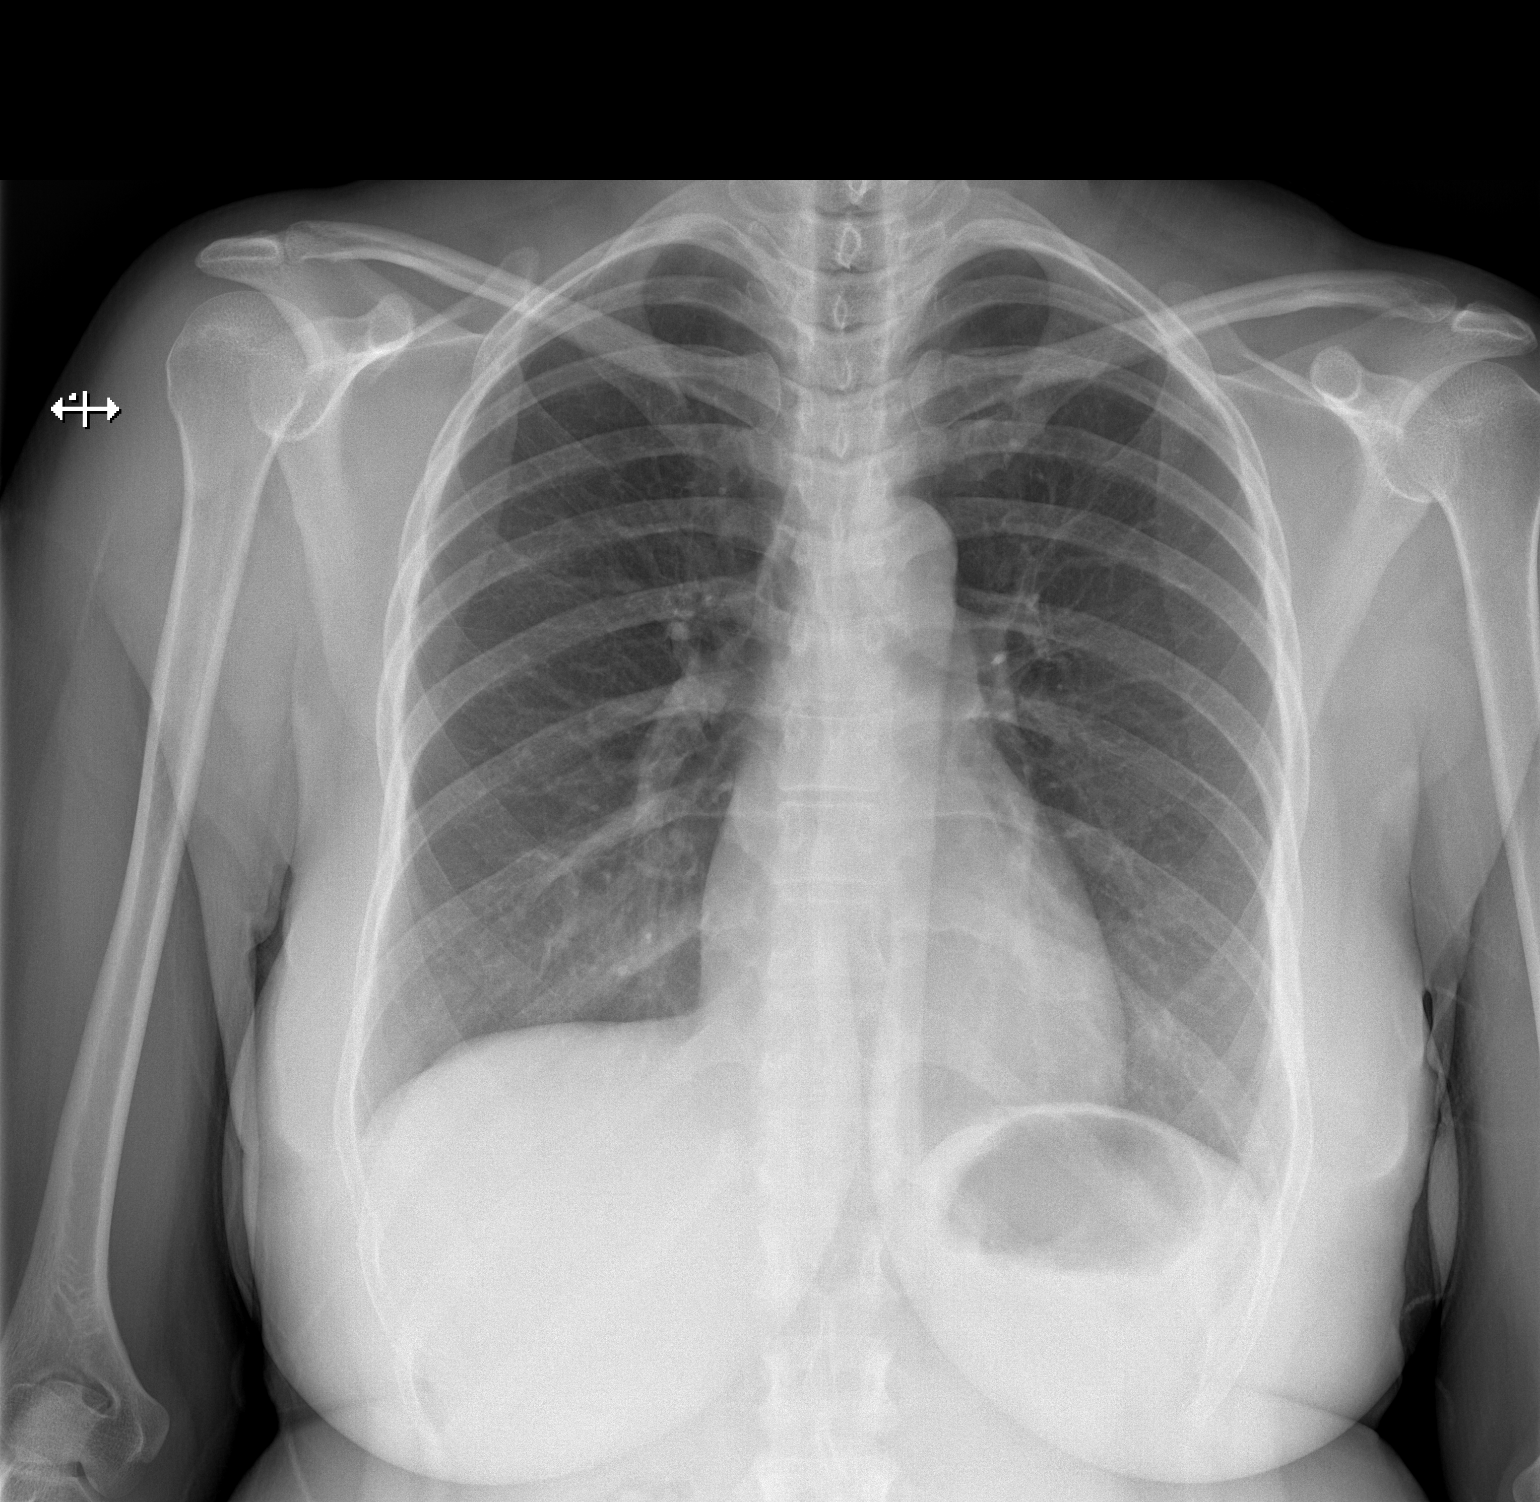

[w chest lat]
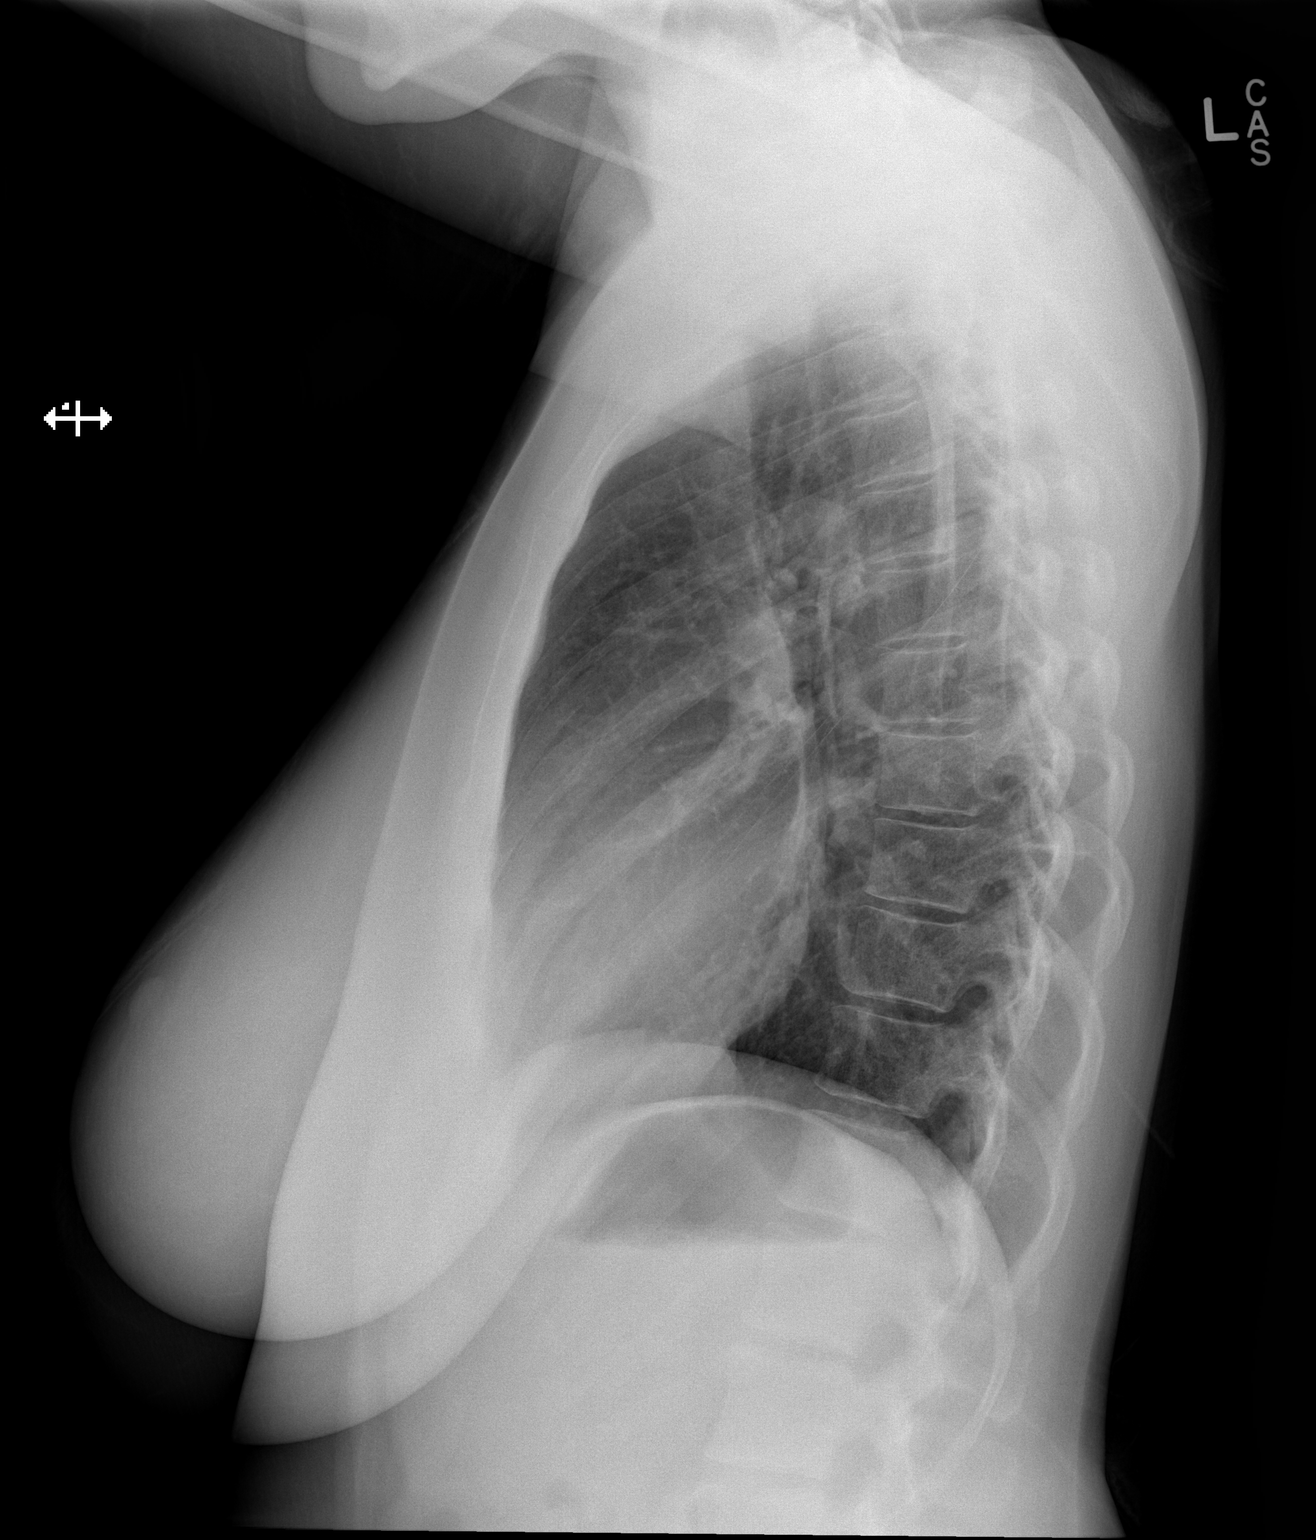

[2 of 2 positions shown; findings below may reference images not displayed]

FINDINGS: Lungs are clear.  Heart size is normal.  No pneumothorax
or pleural fluid.
IMPRESSION: Normal chest.

## 2014-02-20 ENCOUNTER — Encounter: Payer: Self-pay | Admitting: Internal Medicine

## 2014-02-21 ENCOUNTER — Encounter: Payer: Self-pay | Admitting: Gynecology

## 2014-02-23 DIAGNOSIS — M1990 Unspecified osteoarthritis, unspecified site: Secondary | ICD-10-CM

## 2014-02-23 HISTORY — DX: Unspecified osteoarthritis, unspecified site: M19.90

## 2014-03-20 ENCOUNTER — Encounter: Payer: Self-pay | Admitting: Family Medicine

## 2014-03-20 ENCOUNTER — Ambulatory Visit: Payer: BC Managed Care – PPO | Attending: Internal Medicine | Admitting: Family Medicine

## 2014-03-20 VITALS — BP 117/81 | HR 71 | Temp 98.7°F | Resp 16 | Ht 61.0 in | Wt 144.0 lb

## 2014-03-20 DIAGNOSIS — R51 Headache: Secondary | ICD-10-CM

## 2014-03-20 DIAGNOSIS — R079 Chest pain, unspecified: Secondary | ICD-10-CM

## 2014-03-20 LAB — COMPREHENSIVE METABOLIC PANEL
ALK PHOS: 61 U/L (ref 39–117)
ALT: 10 U/L (ref 0–35)
AST: 13 U/L (ref 0–37)
Albumin: 3.8 g/dL (ref 3.5–5.2)
BUN: 10 mg/dL (ref 6–23)
CO2: 26 mEq/L (ref 19–32)
Calcium: 8.8 mg/dL (ref 8.4–10.5)
Chloride: 108 mEq/L (ref 96–112)
Creat: 0.86 mg/dL (ref 0.50–1.10)
Glucose, Bld: 83 mg/dL (ref 70–99)
Potassium: 4.3 mEq/L (ref 3.5–5.3)
SODIUM: 140 meq/L (ref 135–145)
TOTAL PROTEIN: 6.3 g/dL (ref 6.0–8.3)
Total Bilirubin: 0.5 mg/dL (ref 0.2–1.2)

## 2014-03-20 LAB — POCT GLYCOSYLATED HEMOGLOBIN (HGB A1C): Hemoglobin A1C: 5.8

## 2014-03-20 LAB — TSH: TSH: 0.98 u[IU]/mL (ref 0.350–4.500)

## 2014-03-20 MED ORDER — BUTALBITAL-APAP-CAFFEINE 50-325-40 MG PO TABS: ORAL_TABLET | ORAL | Status: DC

## 2014-03-20 NOTE — Progress Notes (Signed)
Pt comes in with c/o left chest wall pain intermit radiating down left arm 5/10 pain. States she is experiencing dull,achy pain now EKG obtained No medical problems noted

## 2014-03-20 NOTE — Progress Notes (Signed)
Subjective:    Patient ID: Misty Bailey, female    DOB: [birth year], 43 y.o.   MRN: [id number]  HPI This patient is here today in followup.  She complains of history of chest pain today. She says this chest pain has been going on for several months up to possibly a year. It is intermittent. At times it gets to a 10 out of 10. It is left-sided. When she has the pain it hurts more to press on the area. The pain doesn't radiate to her left jaw as well as her left arm. She has experienced diaphoresis as well as nausea with this pain. This morning she had a mild chest discomfort at 5/10. There is no radiation, diaphoresis, nausea. She isn't sure if it's worse when she is exerting herself. Sometimes it is worse when she takes a deep breath or when she eats food. She's not sure what relieves the pain. She is a former smoker and does not have any close relatives with heart disease. Her father had a stroke later in life.  She complains of headaches that occur 2 times a week on average. They're occipital. They're associated with nausea without vomiting. She does have photophobia but not phonophobia. Tylenol and sleep help the headaches. She is worried about taking a little [id number] mg several times a week. She sleeps minimally, going to bed at 2 or 3 AM and sleeping only for a few hours. She also drinks a significant amount of caffeine. She drinks 2 cups of hot tea at work and between 4 and 6 cups of cold T. at home. She does not drink water. She does not do very much exercise.   Review of Systems A 12 point review of systems is negative except as per hpi.       Objective:   Physical Exam Nursing note and vitals reviewed. Constitutional: She is oriented to person, place, and time. She appears well-developed and well-nourished.  HENT:  Right Ear: External ear normal.  Left Ear: External ear normal.  Nose: Nose normal.  Mouth/Throat: Oropharynx is clear and moist. No oropharyngeal exudate.  Eyes:  Conjunctivae are normal. Pupils are equal, round, and reactive to light.  Neck: Normal range of motion. Neck supple. No thyromegaly present.  Cardiovascular: Normal rate, regular rhythm and normal heart sounds.   Pulmonary/Chest: Effort normal and breath sounds normal.  Abdominal: Soft. Bowel sounds are normal. She exhibits no distension. There is no tenderness. There is no rebound.  Lymphadenopathy:    She has no cervical adenopathy.  Neurological: She is alert and oriented to person, place, and time. She has normal reflexes.  Skin: Skin is warm and dry.  Psychiatric: She has a normal mood and affect. Her behavior is normal.         Assessment & Plan:  Markeeta was seen today for annual exam and chest pain.  Diagnoses and associated orders for this visit:  Chest pain, unspecified - EKG 12-Lead - Ambulatory referral to Cardiology - Comprehensive metabolic panel - TSH EKG today shows normal sinus rhythm. At this time she denies any pain. I am very concerned about her history and would like her to see cardiology, next week if at all possible. I have given the patient a very strict instructions that if she gets chest pain again for seeing cardiology she is to proceed to the emergency department for evaluation. She verbalizes understanding. Headache(784.0) - HgB A1c - butalbital-acetaminophen-caffeine (FIORICET) 50-325-40 MG per tablet; 1-2 tabs every 4  hours as needed for headache. Max 6 tabs in 24 hours. I've asked the patient to increase her water and slowly decreased the amount of caffeine that she drinks. I've cautioned her not to stop abruptly because she could get a caffeine withdrawal headache. I've also asked her to make sleep hygiene a priority and to work on getting 20 minutes of exercise at least 3 times a week. We will try Fioricet for her headaches for now is that has less Tylenol and. This has not been to be a long-term solution to her headaches.  We'll plan to see this  patient back in 3 months for followup, earlier if needed. With any unresolved or worsening symptoms the patient has been advised to go the emergency department. She will call with any concerns or questions.

## 2014-03-21 ENCOUNTER — Encounter: Payer: Self-pay | Admitting: Gynecology

## 2014-03-21 ENCOUNTER — Ambulatory Visit (INDEPENDENT_AMBULATORY_CARE_PROVIDER_SITE_OTHER): Payer: BC Managed Care – PPO | Admitting: Gynecology

## 2014-03-21 VITALS — BP 112/70 | HR 74 | Resp 16 | Ht 61.0 in | Wt 145.0 lb

## 2014-03-21 DIAGNOSIS — F3289 Other specified depressive episodes: Secondary | ICD-10-CM

## 2014-03-21 DIAGNOSIS — Z0000 Encounter for general adult medical examination without abnormal findings: Secondary | ICD-10-CM

## 2014-03-21 DIAGNOSIS — Z01419 Encounter for gynecological examination (general) (routine) without abnormal findings: Secondary | ICD-10-CM

## 2014-03-21 DIAGNOSIS — F32A Depression, unspecified: Secondary | ICD-10-CM

## 2014-03-21 DIAGNOSIS — F329 Major depressive disorder, single episode, unspecified: Secondary | ICD-10-CM

## 2014-03-21 DIAGNOSIS — B009 Herpesviral infection, unspecified: Secondary | ICD-10-CM

## 2014-03-21 DIAGNOSIS — L732 Hidradenitis suppurativa: Secondary | ICD-10-CM

## 2014-03-21 DIAGNOSIS — N898 Other specified noninflammatory disorders of vagina: Secondary | ICD-10-CM

## 2014-03-21 LAB — POCT WET PREP (WET MOUNT): Clue Cells Wet Prep Whiff POC: NEGATIVE

## 2014-03-21 LAB — POCT URINALYSIS DIPSTICK
Urobilinogen, UA: NEGATIVE
pH, UA: 5

## 2014-03-21 MED ORDER — VALACYCLOVIR HCL 500 MG PO TABS: 500.0000 mg | ORAL_TABLET | Freq: Two times a day (BID) | ORAL | Status: DC

## 2014-03-21 MED ORDER — DOXYCYCLINE HYCLATE 100 MG PO CAPS: 100.0000 mg | ORAL_CAPSULE | Freq: Two times a day (BID) | ORAL | Status: DC

## 2014-03-21 NOTE — Patient Instructions (Signed)
Hidradenitis Suppurativa, Sweat Gland Abscess Hidradenitis suppurativa is a long lasting (chronic), uncommon disease of the sweat glands. With this, boil-like lumps and scarring develop in the groin, some times under the arms (axillae), and under the breasts. It may also uncommonly occur behind the ears, in the crease of the buttocks, and around the genitals.  CAUSES  The cause is from a blocking of the sweat glands. They then become infected. It may cause drainage and odor. It is not contagious. So it cannot be given to someone else. It most often shows up in puberty (about 78 to 45 years of age). But it may happen much later. It is similar to acne which is a disease of the sweat glands. This condition is slightly more common in African-Americans and women. SYMPTOMS   Hidradenitis usually starts as one or more red, tender, swellings in the groin or under the arms (axilla).  Over a period of hours to days the lesions get larger. They often open to the skin surface, draining clear to yellow-colored fluid.  The infected area heals with scarring. DIAGNOSIS  Your caregiver makes this diagnosis by looking at you. Sometimes cultures (growing germs on plates in the lab) may be taken. This is to see what germ (bacterium) is causing the infection.  TREATMENT   Topical germ killing medicine applied to the skin (antibiotics) are the treatment of choice. Antibiotics taken by mouth (systemic) are sometimes needed when the condition is getting worse or is severe.  Avoid tight-fitting clothing which traps moisture in.  Dirt does not cause hidradenitis and it is not caused by poor hygiene.  Involved areas should be cleaned daily using an antibacterial soap. Some patients find that the liquid form of Lever 2000, applied to the involved areas as a lotion after bathing, can help reduce the odor related to this condition.  Sometimes surgery is needed to drain infected areas or remove scarred tissue. Removal of  large amounts of tissue is used only in severe cases.  Birth control pills may be helpful.  Oral retinoids (vitamin A derivatives) for 6 to 12 months which are effective for acne may also help this condition.  Weight loss will improve but not cure hidradenitis. It is made worse by being overweight. But the condition is not caused by being overweight.  This condition is more common in people who have had acne.  It may become worse under stress. There is no medical cure for hidradenitis. It can be controlled, but not cured. The condition usually continues for years with periods of getting worse and getting better (remission). Document Released: 07/26/2004 Document Revised: 03/05/2012 Document Reviewed: 08/11/2008 [facility] Patient Information 2014 [place].  hibiclens-dilute wash area of concern

## 2014-03-21 NOTE — Progress Notes (Signed)
43 y.o. Single African American female   [id number] here for annual exam. Pt is not currently sexually active, boyfriend incarcerated.  Cycles are now regular, were irregular in past.  Pt reports dysmenorrhea but cycles are getting shorter, now 3d, some clots.  Uses tylenol with good results.  Pt denies dyspareunia.  H/o recurrent vaginitis-yeast and Bv, no douching.   Pt with lifelong suppurative hidradenitis-may have tried ocp to control.  Never seen surgery.  Mostly in groin. Pt saw primary for annual and chest pain, normal EKG.   Patient's last menstrual period was 03/06/2014.          Sexually active: yes  The current method of family planning Partner has Vasectomy  Exercising: yes  walking, jumping jacks, squats, dance qod Last pap: 08/09/13 WNL Alcohol: Occasionally  Tobacco: Weed qod BSE: no  Mammogram: Age 25 Normal  Urine: Leuks Trace    Health Maintenance  Topic Date Due  . Tetanus/tdap  12/13/1988  . Influenza Vaccine  07/26/2013  . Pap Smear  08/09/2016    Family History  Problem Relation Age of Onset  . Diabetes Mother   . Hypertension Mother   . Hyperlipidemia Mother   . Hypertension Father   . Stroke Father   . Hyperlipidemia Father   . Hypertension Maternal Grandmother   . Diabetes Maternal Grandmother   . Heart murmur Mother   . Thyroid disease Paternal Aunt   . Osteoporosis Maternal Grandmother     Patient Active Problem List   Diagnosis Date Noted  . Pap smear for cervical cancer screening 08/09/2013  . PE (physical exam), annual 08/09/2013  . Vaginal discharge 08/09/2013    Past Medical History  Diagnosis Date  . Boils   . Anemia   . Panic attacks   . Depression     Past Surgical History  Procedure Laterality Date  . Abdominal exploration surgery  1998/1999 `    Allergies: Codeine  No current outpatient prescriptions on file.   No current facility-administered medications for this visit.    ROS: Pertinent items are noted in  HPI.  Exam:    BP 112/70  Pulse 74  Resp 16  Ht 5\' 1"  (1.549 m)  Wt 145 lb (65.772 kg)  BMI 27.41 kg/m2  LMP 03/06/2014 Weight change: @WEIGHTCHANGE @ Last 3 height recordings:  Ht Readings from Last 3 Encounters:  03/21/14 5\' 1"  (1.549 m)  03/20/14 5\' 1"  (1.549 m)  08/09/13 5\' 1"  (1.549 m)   General appearance: alert, cooperative and appears stated age Head: Normocephalic, without obvious abnormality, atraumatic Neck: no adenopathy, no carotid bruit, no JVD, supple, symmetrical, trachea midline and thyroid not enlarged, symmetric, no tenderness/mass/nodules Lungs: clear to auscultation bilaterally Breasts: normal appearance, no masses or tenderness Heart: regular rate and rhythm, S1, S2 normal, no murmur, click, rub or gallop Abdomen: soft, non-tender; bowel sounds normal; no masses,  no organomegaly Extremities: extremities normal, atraumatic, no cyanosis or edema Skin: Skin color, texture normal, multiple lesions c/w hidradenitis of various ages most at inguinal crease, buttock Lymph nodes: Cervical, supraclavicular, and axillary nodes normal. no inguinal nodes palpated Neurologic: Grossly normal   Pelvic: External genitalia:  no lesions              Urethra: not indicated and normal appearing urethra with no masses, tenderness or lesions              Bartholins and Skenes: Bartholin's, Urethra, Skene's normal  Vagina: normal appearing vagina with normal color, no lesions, white discharge noted              Cervix: normal appearance              Pap taken: no        Bimanual Exam:  Uterus:  uterus is normal size, shape, consistency and nontender                                      Adnexa:    no masses                                      Rectovaginal: Confirms                                      Anus:  normal sphincter tone, no lesions  A: well woman hidradenitis Vaginal dishcarge   P: mammogram pap smear next year Recommend avoiding undergarments  that cut in inguinal area, warm compress and trial of doxycycline, hibiclens, rto after 2w WP normal counseled on breast self exam, mammography screening, adequate intake of calcium and vitamin D, diet and exercise return annually or prn   An After Visit Summary was printed and given to the patient.

## 2014-03-26 ENCOUNTER — Ambulatory Visit: Payer: BC Managed Care – PPO | Attending: Cardiology | Admitting: Cardiology

## 2014-03-26 ENCOUNTER — Encounter: Payer: Self-pay | Admitting: Cardiology

## 2014-03-26 DIAGNOSIS — F17200 Nicotine dependence, unspecified, uncomplicated: Secondary | ICD-10-CM | POA: Insufficient documentation

## 2014-03-26 DIAGNOSIS — M940 Chondrocostal junction syndrome [Tietze]: Secondary | ICD-10-CM

## 2014-03-26 DIAGNOSIS — R079 Chest pain, unspecified: Secondary | ICD-10-CM | POA: Insufficient documentation

## 2014-03-26 MED ORDER — NAPROXEN 500 MG PO TABS: 500.0000 mg | ORAL_TABLET | Freq: Two times a day (BID) | ORAL | Status: DC

## 2014-03-26 NOTE — Assessment & Plan Note (Signed)
I have given her naproxen 500 mg twice a day for 2 weeks. She will use a heating pad at low to medium heat as much as possible. No further cardiac workup.

## 2014-03-26 NOTE — Progress Notes (Signed)
HPI Misty Bailey is a delightful 43 year old black female referred today for chest pain. Has been going off and on for several years. Has been particularly bad lately. Sometimes it can last all day. It is substernal and can radiate to her shoulder. It is worse with taking a deep breath or coughing. She denies any fever, chills, shortness of breath, hemoptysis.  She denies any chest trauma or injury.  EKG on March 26 was completely normal.  Past Medical History  Diagnosis Date  . Suppurative hidradenitis   . Anemia   . Panic attacks   . Depression     Current Outpatient Prescriptions  Medication Sig Dispense Refill  . doxycycline (VIBRAMYCIN) 100 MG capsule Take 1 capsule (100 mg total) by mouth 2 (two) times daily. Take BID for 14 days.  Take with food as can cause GI distress.  28 capsule  0  . naproxen (NAPROSYN) 500 MG tablet Take 1 tablet (500 mg total) by mouth 2 (two) times daily with a meal.  60 tablet  0  . valACYclovir (VALTREX) 500 MG tablet Take 1 tablet (500 mg total) by mouth 2 (two) times daily. Take one tablet BID at onset of symptoms for 3 days.  6 tablet  1   No current facility-administered medications for this visit.    Allergies  Allergen Reactions  . Codeine Shortness Of Breath    Family History  Problem Relation Age of Onset  . Diabetes Mother   . Hypertension Mother   . Hyperlipidemia Mother   . Hypertension Father   . Stroke Father   . Hyperlipidemia Father   . Hypertension Maternal Grandmother   . Diabetes Maternal Grandmother   . Heart murmur Mother   . Thyroid disease Paternal Aunt   . Osteoporosis Maternal Grandmother     History   Social History  . Marital Status: Single    Spouse Name: N/A    Number of Children: N/A  . Years of Education: N/A   Occupational History  . Not on file.   Social History Main Topics  . Smoking status: Current Some Day Smoker  . Smokeless tobacco: Current User     Comment: Weed  . Alcohol Use: No   Comment: Occassionally   . Drug Use: No  . Sexual Activity: Not Currently    Partners: Male    Birth Control/ Protection: Surgical     Comment: Partner has Vasectomy   Other Topics Concern  . Not on file   Social History Narrative   Boyfriend incarcerated 2014-2018    ROS ALL NEGATIVE EXCEPT THOSE NOTED IN HPI  PE  General Appearance: well developed, well nourished in no acute distress, obese HEENT: symmetrical face, PERRLA, good dentition  Neck: no JVD, thyromegaly, or adenopathy, trachea midline Chest: symmetric without deformity, exquisite costochondral tenderness at 2 and 3 particularly on the left side. Cardiac: PMI non-displaced, RRR, normal S1, S2, no gallop or murmur, no rub Lung: clear to ausculation and percussion Vascular: all pulses full without bruits  Abdominal: nondistended, nontender, good bowel sounds, no HSM, no bruits Extremities: no cyanosis, clubbing or edema, no sign of DVT, no varicosities  Skin: normal color, no rashes Neuro: alert and oriented x 3, non-focal Pysch: normal affect  EKG  BMET    Component Value Date/Time   NA 140 03/20/2014 1258   K 4.3 03/20/2014 1258   CL 108 03/20/2014 1258   CO2 26 03/20/2014 1258   GLUCOSE 83 03/20/2014 1258   BUN 10  03/20/2014 1258   CREATININE 0.86 03/20/2014 1258   CREATININE 1.00 02/24/2012 0043   CALCIUM 8.8 03/20/2014 1258   GFRNONAA 70 08/09/2013 1045   GFRAA 81 08/09/2013 1045    Lipid Panel  No results found for this basename: chol, trig, hdl, cholhdl, vldl, ldlcalc    CBC    Component Value Date/Time   WBC 5.2 08/09/2013 1045   RBC 4.44 08/09/2013 1045   HGB 12.7 08/09/2013 1045   HCT 39.5 08/09/2013 1045   PLT 300 08/09/2013 1045   MCV 89.0 08/09/2013 1045   MCH 28.6 08/09/2013 1045   MCHC 32.2 08/09/2013 1045   RDW 13.1 08/09/2013 1045   LYMPHSABS 2.0 08/09/2013 1045   MONOABS 0.5 08/09/2013 1045   EOSABS 0.2 08/09/2013 1045   BASOSABS 0.0 08/09/2013 1045

## 2014-03-26 NOTE — Patient Instructions (Signed)
Pt instructed to take prescribed Naprosyn 500 mg tab BID Apply heat to chest three times/day

## 2014-03-27 NOTE — Progress Notes (Signed)
Not a pt at this practice.  

## 2014-03-31 ENCOUNTER — Telehealth: Payer: Self-pay | Admitting: Gynecology

## 2014-03-31 NOTE — Telephone Encounter (Signed)
Routing to Dr. Lathrop.   

## 2014-03-31 NOTE — Telephone Encounter (Signed)
Patient called during lunch to see if there was an alternative medication for doxycycline (VIBRAMYCIN) 100 MG capsule daid it is going to cost her to much and cant afford it

## 2014-04-01 NOTE — Telephone Encounter (Signed)
Left message to call Kaitlyn at [phone]. 

## 2014-04-03 NOTE — Telephone Encounter (Signed)
Left message to call Misty Bailey at [phone]. 

## 2014-04-07 ENCOUNTER — Ambulatory Visit: Payer: BC Managed Care – PPO | Admitting: Gynecology

## 2014-04-07 NOTE — Telephone Encounter (Signed)
Per Dr.Lathrop no cancellation fee for patient due to not being able to take medication for 2 weeks prior to follow up.

## 2014-04-07 NOTE — Telephone Encounter (Signed)
Spoke with patient. Patient states that she was going to have to pay 65 dollars for her doxycycline prescription. Patient had 2 week follow up appointment today at 6:00 but had to cancel due to not being able to afford the medication which she was having the follow up for. New appointment made for 4/27 at 1800 with Dr.Lathrop. Patient agreeable to date and time. Advised would check with provider for further instructions for medication changes and call her back.

## 2014-04-17 NOTE — Telephone Encounter (Signed)
No fee charged to patient for cancelled appointment.

## 2014-04-21 ENCOUNTER — Ambulatory Visit (INDEPENDENT_AMBULATORY_CARE_PROVIDER_SITE_OTHER): Payer: BC Managed Care – PPO | Admitting: Gynecology

## 2014-04-21 ENCOUNTER — Encounter: Payer: Self-pay | Admitting: Gynecology

## 2014-04-21 VITALS — BP 138/80 | HR 74 | Resp 18 | Ht 61.0 in | Wt 144.0 lb

## 2014-04-21 DIAGNOSIS — N898 Other specified noninflammatory disorders of vagina: Secondary | ICD-10-CM

## 2014-04-21 DIAGNOSIS — L732 Hidradenitis suppurativa: Secondary | ICD-10-CM

## 2014-04-21 DIAGNOSIS — Z113 Encounter for screening for infections with a predominantly sexual mode of transmission: Secondary | ICD-10-CM

## 2014-04-21 DIAGNOSIS — Z309 Encounter for contraceptive management, unspecified: Secondary | ICD-10-CM

## 2014-04-21 LAB — POCT WET PREP (WET MOUNT): Clue Cells Wet Prep Whiff POC: NEGATIVE

## 2014-04-21 MED ORDER — LEVONORGESTREL-ETHINYL ESTRAD 0.1-20 MG-MCG PO TABS: 1.0000 | ORAL_TABLET | Freq: Every day | ORAL | Status: DC

## 2014-04-21 NOTE — Patient Instructions (Signed)
Start ocp the first day of upcoming cycle

## 2014-04-21 NOTE — Progress Notes (Signed)
Subjective:     Patient ID: Misty Bailey, female   DOB: Jan 14, 1971, 43 y.o.   MRN: [id number]  HPI Comments: Pt here interested in STD screen, her partner is currently incarcerated and will be there for another 4y, she has met with a former partner and would like to be assured she has no infection in case it becomes sexual. She states that her cycles are now normal.  She reports a regular discharge but no odor. She was not able to get the doxycycline due to cost    Review of Systems  Constitutional: Negative for fever and chills.  Genitourinary: Positive for vaginal discharge. Negative for vaginal bleeding and pelvic pain.       Objective:   Physical Exam  Nursing note and vitals reviewed. Constitutional: She is oriented to person, place, and time. She appears well-developed and well-nourished.  Neurological: She is alert and oriented to person, place, and time.   Pelvic: External genitalia:  Multiple lesions c/w various stages of supp. hydradenitis               Urethra:  normal appearing urethra with no masses, tenderness or lesions              Bartholins and Skenes: normal                 Vagina: normal appearing vagina with normal color, no lesions, whte thin discharge              Cervix: normal appearance                          Assessment:     STD screen   supp. Hydradenitis Contraceptive management  Plan:     WP negative GC/CTM from cervix Will rto for STD panel Will start ocp both for contraception and may benefit  supp. Hydradenitis Referral to dermatolgy

## 2014-04-24 LAB — IPS N GONORRHOEA AND CHLAMYDIA BY PCR

## 2014-04-25 ENCOUNTER — Telehealth: Payer: Self-pay | Admitting: Gynecology

## 2014-04-25 NOTE — Telephone Encounter (Signed)
#   listed as home/mobile is not a working #

## 2014-04-25 NOTE — Telephone Encounter (Signed)
Left message for patient to call back. Need to advised of dermatology appt 04/28/2014 @1030  w Rickard Rhymes PA, she need to arrive about 15 minutes early w/ insurance card, photo id, and copay/ded. [place]

## 2014-04-28 ENCOUNTER — Telehealth: Payer: Self-pay | Admitting: Gynecology

## 2014-04-28 NOTE — Telephone Encounter (Signed)
Patient did not keep dermatology referral appointment.

## 2014-04-28 NOTE — Telephone Encounter (Signed)
Error, will attach call to previous msg

## 2014-04-29 NOTE — Telephone Encounter (Signed)
Left message for patient to call back. Need to coordinate dermatologist appt.

## 2014-04-29 NOTE — Telephone Encounter (Signed)
Left message for patient to call back. Need to coordinate dermatologist appt.  °

## 2014-05-01 ENCOUNTER — Telehealth: Payer: Self-pay | Admitting: Orthopedic Surgery

## 2014-05-01 NOTE — Telephone Encounter (Signed)
LMTCB for results. 

## 2014-05-02 NOTE — Telephone Encounter (Signed)
PC to patient. Automated message at [phone] states pt is not accepting calls at this time. I will try again later. 

## 2014-05-08 NOTE — Telephone Encounter (Signed)
PC to patient. Automated message at [phone] states pt is not accepting calls at this time. I will try again later.

## 2014-05-13 ENCOUNTER — Encounter: Payer: Self-pay | Admitting: *Deleted

## 2014-05-14 NOTE — Telephone Encounter (Signed)
Letter mailed on 05/13/14.  Also see result note.

## 2014-06-19 ENCOUNTER — Encounter: Payer: Self-pay | Admitting: Gynecology

## 2014-08-13 ENCOUNTER — Ambulatory Visit: Payer: BC Managed Care – PPO | Attending: Internal Medicine | Admitting: Internal Medicine

## 2014-08-13 ENCOUNTER — Encounter: Payer: Self-pay | Admitting: Internal Medicine

## 2014-08-13 VITALS — BP 106/71 | HR 76 | Temp 98.0°F | Resp 16 | Ht 61.0 in | Wt 144.0 lb

## 2014-08-13 DIAGNOSIS — H9202 Otalgia, left ear: Secondary | ICD-10-CM

## 2014-08-13 DIAGNOSIS — F17200 Nicotine dependence, unspecified, uncomplicated: Secondary | ICD-10-CM

## 2014-08-13 DIAGNOSIS — H9209 Otalgia, unspecified ear: Secondary | ICD-10-CM

## 2014-08-13 DIAGNOSIS — M62838 Other muscle spasm: Secondary | ICD-10-CM | POA: Insufficient documentation

## 2014-08-13 DIAGNOSIS — R209 Unspecified disturbances of skin sensation: Secondary | ICD-10-CM | POA: Diagnosis not present

## 2014-08-13 MED ORDER — CIPROFLOXACIN-HYDROCORTISONE 0.2-1 % OT SUSP: 3.0000 [drp] | Freq: Two times a day (BID) | OTIC | Status: DC

## 2014-08-13 MED ORDER — CYCLOBENZAPRINE HCL 10 MG PO TABS: 10.0000 mg | ORAL_TABLET | Freq: Every day | ORAL | Status: DC

## 2014-08-13 NOTE — Progress Notes (Signed)
Patient ID: Misty Bailey, female   DOB: 04/11/1971, 43 y.o.   MRN: [id number]  CC: ear pain  HPI:  Patient presents today with left ear pain for one year.  Reports that last two weeks have been progressively worse. Feels as if something is stabbing the inside of her ear and painful to touch the tragus. She states that she noticed the pain [place]. C/o back pain below left shoulder blade.  She states that it has been causing her left arm to go numb, like a "muscle spasm".    Allergies  Allergen Reactions  . Codeine Shortness Of Breath   Past Medical History  Diagnosis Date  . Suppurative hidradenitis   . Anemia   . Panic attacks   . Depression   . Arthritis 02/2014    [place]   Current Outpatient Prescriptions on File Prior to Visit  Medication Sig Dispense Refill  . levonorgestrel-ethinyl estradiol (AVIANE,ALESSE,LESSINA) 0.1-20 MG-MCG tablet Take 1 tablet by mouth daily.  3 Package  3  . naproxen (NAPROSYN) 500 MG tablet Take 500 mg by mouth as needed.      . valACYclovir (VALTREX) 500 MG tablet Take 1 tablet (500 mg total) by mouth 2 (two) times daily. Take one tablet BID at onset of symptoms for 3 days.  6 tablet  1   No current facility-administered medications on file prior to visit.   Family History  Problem Relation Age of Onset  . Diabetes Mother   . Hypertension Mother   . Hyperlipidemia Mother   . Hypertension Father   . Stroke Father   . Hyperlipidemia Father   . Hypertension Maternal Grandmother   . Diabetes Maternal Grandmother   . Heart murmur Mother   . Thyroid disease Paternal Aunt   . Osteoporosis Maternal Grandmother    History   Social History  . Marital Status: Single    Spouse Name: N/A    Number of Children: N/A  . Years of Education: N/A   Occupational History  . Not on file.   Social History Main Topics  . Smoking status: Current Some Day Smoker  . Smokeless tobacco: Current User     Comment: Weed  .  Alcohol Use: No     Comment: Occassionally   . Drug Use: No  . Sexual Activity: Not Currently    Partners: Male    Birth Control/ Protection: Surgical     Comment: Partner has Vasectomy   Other Topics Concern  . Not on file   Social History Narrative   Boyfriend incarcerated 2014-2018    Review of Systems: See HPI   Objective:   Filed Vitals:   08/13/14 1204  BP: 106/71  Pulse: 76  Temp: 98 F (36.7 C)  Resp: 16   Physical Exam  Constitutional: She is oriented to person, place, and time.  HENT:  Mouth/Throat: Oropharynx is clear and moist.  Cerumen impaction of bilateral ears After irrigation, left ear slightly erythematous and tender to touch  Eyes: EOM are normal. Pupils are equal, round, and reactive to light.  Neck: Normal range of motion.  Cardiovascular: Normal rate, regular rhythm and normal heart sounds.   Pulmonary/Chest: Effort normal and breath sounds normal.  Abdominal: Soft. Bowel sounds are normal.  Musculoskeletal: She exhibits tenderness (mid upper back).  Neurological: She is alert and oriented to person, place, and time.     Lab Results  Component Value Date   WBC 5.2 08/09/2013  HGB 12.7 08/09/2013   HCT 39.5 08/09/2013   MCV 89.0 08/09/2013   PLT 300 08/09/2013   Lab Results  Component Value Date   CREATININE 0.86 03/20/2014   BUN 10 03/20/2014   NA 140 03/20/2014   K 4.3 03/20/2014   CL 108 03/20/2014   CO2 26 03/20/2014    Lab Results  Component Value Date   HGBA1C 5.8 03/20/2014   Lipid Panel  No results found for this basename: chol, trig, hdl, cholhdl, vldl, ldlcalc       Assessment and plan:   Mar was seen today for follow-up.  Diagnoses and associated orders for this visit:  Ear ache, left - ciprofloxacin-hydrocortisone (CIPRO HC) otic suspension; Place 3 drops into the left ear 2 (two) times daily.  Muscle spasm - cyclobenzaprine (FLEXERIL) 10 MG tablet; Take 1 tablet (10 mg total) by mouth at bedtime. May  continue to use Naproxen for inflammation  Tobacco use disorder Smoking cessation discussed  Return if symptoms worsen or fail to improve.        Chari Manning, NP-C [facility] [phone] 08/13/2014, 12:16 PM

## 2014-08-13 NOTE — Patient Instructions (Signed)
Smoking Cessation Quitting smoking is important to your health and has many advantages. However, it is not always easy to quit since nicotine is a very addictive drug. Oftentimes, people try 3 times or more before being able to quit. This document explains the best ways for you to prepare to quit smoking. Quitting takes hard work and a lot of effort, but you can do it. ADVANTAGES OF QUITTING SMOKING  You will live longer, feel better, and live better.  Your body will feel the impact of quitting smoking almost immediately.  Within 20 minutes, blood pressure decreases. Your pulse returns to its normal level.  After 8 hours, carbon monoxide levels in the blood return to normal. Your oxygen level increases.  After 24 hours, the chance of having a heart attack starts to decrease. Your breath, hair, and body stop smelling like smoke.  After 48 hours, damaged nerve endings begin to recover. Your sense of taste and smell improve.  After 72 hours, the body is virtually free of nicotine. Your bronchial tubes relax and breathing becomes easier.  After 2 to 12 weeks, lungs can hold more air. Exercise becomes easier and circulation improves.  The risk of having a heart attack, stroke, cancer, or lung disease is greatly reduced.  After 1 year, the risk of coronary heart disease is cut in half.  After 5 years, the risk of stroke falls to the same as a nonsmoker.  After 10 years, the risk of lung cancer is cut in half and the risk of other cancers decreases significantly.  After 15 years, the risk of coronary heart disease drops, usually to the level of a nonsmoker.  If you are pregnant, quitting smoking will improve your chances of having a healthy baby.  The people you live with, especially any children, will be healthier.  You will have extra money to spend on things other than cigarettes. QUESTIONS TO THINK ABOUT BEFORE ATTEMPTING TO QUIT You may want to talk about your answers with your  health care provider.  Why do you want to quit?  If you tried to quit in the past, what helped and what did not?  What will be the most difficult situations for you after you quit? How will you plan to handle them?  Who can help you through the tough times? Your family? Friends? A health care provider?  What pleasures do you get from smoking? What ways can you still get pleasure if you quit? Here are some questions to ask your health care provider:  How can you help me to be successful at quitting?  What medicine do you think would be best for me and how should I take it?  What should I do if I need more help?  What is smoking withdrawal like? How can I get information on withdrawal? GET READY  Set a quit date.  Change your environment by getting rid of all cigarettes, ashtrays, matches, and lighters in your home, car, or work. Do not let people smoke in your home.  Review your past attempts to quit. Think about what worked and what did not. GET SUPPORT AND ENCOURAGEMENT You have a better chance of being successful if you have help. You can get support in many ways.  Tell your family, friends, and coworkers that you are going to quit and need their support. Ask them not to smoke around you.  Get individual, group, or telephone counseling and support. Programs are available at local hospitals and health centers. Call   your local health department for information about programs in your area.  Spiritual beliefs and practices may help some smokers quit.  Download a "quit meter" on your computer to keep track of quit statistics, such as how long you have gone without smoking, cigarettes not smoked, and money saved.  Get a self-help book about quitting smoking and staying off tobacco. LEARN NEW SKILLS AND BEHAVIORS  Distract yourself from urges to smoke. Talk to someone, go for a walk, or occupy your time with a task.  Change your normal routine. Take a different route to work.  Drink tea instead of coffee. Eat breakfast in a different place.  Reduce your stress. Take a hot bath, exercise, or read a book.  Plan something enjoyable to do every day. Reward yourself for not smoking.  Explore interactive web-based programs that specialize in helping you quit. GET MEDICINE AND USE IT CORRECTLY Medicines can help you stop smoking and decrease the urge to smoke. Combining medicine with the above behavioral methods and support can greatly increase your chances of successfully quitting smoking.  Nicotine replacement therapy helps deliver nicotine to your body without the negative effects and risks of smoking. Nicotine replacement therapy includes nicotine gum, lozenges, inhalers, nasal sprays, and skin patches. Some may be available over-the-counter and others require a prescription.  Antidepressant medicine helps people abstain from smoking, but how this works is unknown. This medicine is available by prescription.  Nicotinic receptor partial agonist medicine simulates the effect of nicotine in your brain. This medicine is available by prescription. Ask your health care provider for advice about which medicines to use and how to use them based on your health history. Your health care provider will tell you what side effects to look out for if you choose to be on a medicine or therapy. Carefully read the information on the package. Do not use any other product containing nicotine while using a nicotine replacement product.  RELAPSE OR DIFFICULT SITUATIONS Most relapses occur within the first 3 months after quitting. Do not be discouraged if you start smoking again. Remember, most people try several times before finally quitting. You may have symptoms of withdrawal because your body is used to nicotine. You may crave cigarettes, be irritable, feel very hungry, cough often, get headaches, or have difficulty concentrating. The withdrawal symptoms are only temporary. They are strongest  when you first quit, but they will go away within 10-14 days. To reduce the chances of relapse, try to:  Avoid drinking alcohol. Drinking lowers your chances of successfully quitting.  Reduce the amount of caffeine you consume. Once you quit smoking, the amount of caffeine in your body increases and can give you symptoms, such as a rapid heartbeat, sweating, and anxiety.  Avoid smokers because they can make you want to smoke.  Do not let weight gain distract you. Many smokers will gain weight when they quit, usually less than 10 pounds. Eat a healthy diet and stay active. You can always lose the weight gained after you quit.  Find ways to improve your mood other than smoking. FOR MORE INFORMATION  www.smokefree.gov  Document Released: 12/06/2001 Document Revised: 04/28/2014 Document Reviewed: 03/22/2012 ExitCare Patient Information 2015 [employer]. This information is not intended to replace advice given to you by your health care provider. Make sure you discuss any questions you have with your health care provider.  

## 2014-08-13 NOTE — Progress Notes (Signed)
Pt is here today c/o mid back pain and pain in her left ear.

## 2014-09-03 ENCOUNTER — Telehealth: Payer: Self-pay | Admitting: Gynecology

## 2014-09-03 NOTE — Telephone Encounter (Signed)
Spoke with patient. She c/o increased white vaginal discharge x 3 days. Feels discharge is more than usual.  LMP 08/18/14.  Feels she has a yeast infection and is concerned.  Patient at work so is unable to discuss much further.Denies fevers and dysuria. She is off tomorrow and requests appointment tomorrow for evaluation. Dr. Charlies Constable not in office on Thursday, patient agreeable to first available provider.  Scheduled for Milford Cage, FNP, patient agreeable. 09/04/14.  Routing to provider for final review. Patient agreeable to disposition. Will close encounter

## 2014-09-03 NOTE — Telephone Encounter (Signed)
Pt is having excessive discharge and pain/pressure in abdomin has been going on for a few days

## 2014-09-04 ENCOUNTER — Ambulatory Visit (INDEPENDENT_AMBULATORY_CARE_PROVIDER_SITE_OTHER): Payer: BC Managed Care – PPO | Admitting: Nurse Practitioner

## 2014-09-04 ENCOUNTER — Encounter: Payer: Self-pay | Admitting: Nurse Practitioner

## 2014-09-04 VITALS — BP 122/76 | HR 64 | Resp 16 | Ht 61.0 in | Wt 140.6 lb

## 2014-09-04 DIAGNOSIS — N9489 Other specified conditions associated with female genital organs and menstrual cycle: Secondary | ICD-10-CM

## 2014-09-04 DIAGNOSIS — R102 Pelvic and perineal pain: Secondary | ICD-10-CM

## 2014-09-04 DIAGNOSIS — A499 Bacterial infection, unspecified: Secondary | ICD-10-CM

## 2014-09-04 DIAGNOSIS — N760 Acute vaginitis: Secondary | ICD-10-CM

## 2014-09-04 DIAGNOSIS — B9689 Other specified bacterial agents as the cause of diseases classified elsewhere: Secondary | ICD-10-CM

## 2014-09-04 LAB — POCT URINALYSIS DIPSTICK
Bilirubin, UA: NEGATIVE
Glucose, UA: NEGATIVE
Ketones, UA: NEGATIVE
Leukocytes, UA: NEGATIVE
NITRITE UA: NEGATIVE
PH UA: 5
Protein, UA: NEGATIVE
RBC UA: NEGATIVE
UROBILINOGEN UA: NEGATIVE

## 2014-09-04 MED ORDER — METRONIDAZOLE 0.75 % VA GEL: 1.0000 | Freq: Every day | VAGINAL | Status: DC

## 2014-09-04 NOTE — Progress Notes (Signed)
Subjective:     Patient ID: Misty Bailey, female   DOB: 1971-10-22, 43 y.o.   MRN: [id number]  HPI  This 43 yo AA Fe complains of vaginal discharge and lower abdominal pressure X 3 days. Mostly has vaginal discharge that is white and thin.  More than usual.  LMP 8/24  And uses condoms some times.  States she has no urinary symptoms or pelvic pain.  She has been with a new partner but both have been checked for STD's and was negative.  Last SA on Friday night without pain.  No fever or chills.   Review of Systems  Constitutional: Negative for fever, chills and fatigue.  Gastrointestinal: Negative for nausea, vomiting, abdominal pain, diarrhea, constipation and abdominal distention.  Genitourinary: Positive for vaginal discharge and pelvic pain. Negative for dysuria, urgency, frequency, hematuria, flank pain, vaginal bleeding, genital sores, vaginal pain and dyspareunia.       More like pelvic pressure  Musculoskeletal: Negative.   Skin: Negative.   Neurological: Negative.   Psychiatric/Behavioral: Negative.        Objective:   Physical Exam  Constitutional: She appears well-developed and well-nourished.  Abdominal: Soft. She exhibits no distension. There is no tenderness. There is no rebound and no guarding.  Genitourinary:  White thin vaginal discharge.  No pain or tenderness over the bladder, no adnexal pain.  Wet prep: PH: 5.5; KOH: negative. NSS: + clue cells.        Assessment:     BV R/O UTI secondary to pelvic pressure    Plan:     Metrogel Vaginal cream HS X 5 Will call with Urine C&S results If symptoms not improved to call back.

## 2014-09-04 NOTE — Patient Instructions (Signed)
Bacterial Vaginosis Bacterial vaginosis is a vaginal infection that occurs when the normal balance of bacteria in the vagina is disrupted. It results from an overgrowth of certain bacteria. This is the most common vaginal infection in women of childbearing age. Treatment is important to prevent complications, especially in pregnant women, as it can cause a premature delivery. CAUSES  Bacterial vaginosis is caused by an increase in harmful bacteria that are normally present in smaller amounts in the vagina. Several different kinds of bacteria can cause bacterial vaginosis. However, the reason that the condition develops is not fully understood. RISK FACTORS Certain activities or behaviors can put you at an increased risk of developing bacterial vaginosis, including:  Having a new sex partner or multiple sex partners.  Douching.  Using an intrauterine device (IUD) for contraception. Women do not get bacterial vaginosis from toilet seats, bedding, swimming pools, or contact with objects around them. SIGNS AND SYMPTOMS  Some women with bacterial vaginosis have no signs or symptoms. Common symptoms include:  Grey vaginal discharge.  A fishlike odor with discharge, especially after sexual intercourse.  Itching or burning of the vagina and vulva.  Burning or pain with urination. DIAGNOSIS  Your health care provider will take a medical history and examine the vagina for signs of bacterial vaginosis. A sample of vaginal fluid may be taken. Your health care provider will look at this sample under a microscope to check for bacteria and abnormal cells. A vaginal pH test may also be done.  TREATMENT  Bacterial vaginosis may be treated with antibiotic medicines. These may be given in the form of a pill or a vaginal cream. A second round of antibiotics may be prescribed if the condition comes back after treatment.  HOME CARE INSTRUCTIONS   Only take over-the-counter or prescription medicines as  directed by your health care provider.  If antibiotic medicine was prescribed, take it as directed. Make sure you finish it even if you start to feel better.  Do not have sex until treatment is completed.  Tell all sexual partners that you have a vaginal infection. They should see their health care provider and be treated if they have problems, such as a mild rash or itching.  Practice safe sex by using condoms and only having one sex partner. SEEK MEDICAL CARE IF:   Your symptoms are not improving after 3 days of treatment.  You have increased discharge or pain.  You have a fever. MAKE SURE YOU:   Understand these instructions.  Will watch your condition.  Will get help right away if you are not doing well or get worse. FOR MORE INFORMATION  Centers for Disease Control and Prevention, Division of STD Prevention: www.cdc.gov/std American Sexual Health Association (ASHA): www.ashastd.org  Document Released: 12/12/2005 Document Revised: 10/02/2013 Document Reviewed: 07/24/2013 ExitCare Patient Information 2015 [employer]. This information is not intended to replace advice given to you by your health care provider. Make sure you discuss any questions you have with your health care provider.  

## 2014-09-05 LAB — URINE CULTURE
Colony Count: NO GROWTH
Organism ID, Bacteria: NO GROWTH

## 2014-09-07 NOTE — Progress Notes (Signed)
Encounter reviewed by Dr. Brook Silva.  

## 2014-09-16 ENCOUNTER — Telehealth: Payer: Self-pay | Admitting: Nurse Practitioner

## 2014-09-16 NOTE — Telephone Encounter (Signed)
Patient is requesting a change in medication due to cost. Patient was seen recently by Edman Circle.

## 2014-09-16 NOTE — Telephone Encounter (Signed)
Left message to call Kaitlyn at [phone]. 

## 2014-09-17 NOTE — Telephone Encounter (Signed)
Left message to call office

## 2014-09-23 NOTE — Telephone Encounter (Signed)
Left message to call Lyle Niblett at [phone]. 

## 2014-09-24 ENCOUNTER — Encounter: Payer: Self-pay | Admitting: *Deleted

## 2014-09-25 NOTE — Telephone Encounter (Signed)
Misty Bailey, patient has been called x 3 with no response. Okay to close encounter?

## 2014-09-25 NOTE — Telephone Encounter (Signed)
Ok to close DL for [facility]

## 2014-10-27 ENCOUNTER — Encounter: Payer: Self-pay | Admitting: Nurse Practitioner

## 2014-11-25 ENCOUNTER — Telehealth: Payer: Self-pay

## 2014-11-25 NOTE — Telephone Encounter (Signed)
lmtcb to reschedule AEX with Dr. Lathrop 

## 2014-12-10 ENCOUNTER — Encounter: Payer: Self-pay | Admitting: Gynecology

## 2015-01-23 ENCOUNTER — Ambulatory Visit: Payer: Self-pay | Attending: Internal Medicine | Admitting: Internal Medicine

## 2015-01-23 ENCOUNTER — Encounter: Payer: Self-pay | Admitting: Internal Medicine

## 2015-01-23 VITALS — BP 138/84 | HR 62 | Temp 99.0°F | Resp 20 | Ht 61.0 in | Wt 142.0 lb

## 2015-01-23 DIAGNOSIS — Z79899 Other long term (current) drug therapy: Secondary | ICD-10-CM | POA: Insufficient documentation

## 2015-01-23 DIAGNOSIS — R110 Nausea: Secondary | ICD-10-CM | POA: Insufficient documentation

## 2015-01-23 DIAGNOSIS — F410 Panic disorder [episodic paroxysmal anxiety] without agoraphobia: Secondary | ICD-10-CM | POA: Insufficient documentation

## 2015-01-23 DIAGNOSIS — Z87891 Personal history of nicotine dependence: Secondary | ICD-10-CM | POA: Insufficient documentation

## 2015-01-23 DIAGNOSIS — K30 Functional dyspepsia: Secondary | ICD-10-CM

## 2015-01-23 DIAGNOSIS — R1013 Epigastric pain: Secondary | ICD-10-CM

## 2015-01-23 DIAGNOSIS — Z885 Allergy status to narcotic agent status: Secondary | ICD-10-CM | POA: Insufficient documentation

## 2015-01-23 DIAGNOSIS — F329 Major depressive disorder, single episode, unspecified: Secondary | ICD-10-CM | POA: Insufficient documentation

## 2015-01-23 MED ORDER — ONDANSETRON HCL 4 MG PO TABS: 4.0000 mg | ORAL_TABLET | Freq: Three times a day (TID) | ORAL | Status: DC | PRN

## 2015-01-23 MED ORDER — RANITIDINE HCL 150 MG PO TABS: 150.0000 mg | ORAL_TABLET | Freq: Two times a day (BID) | ORAL | Status: DC

## 2015-01-23 NOTE — Progress Notes (Signed)
Patient complains of indigestion x 2 months-using baking soda and water for treatment Patient also complains of diarrhea, nausea, and stomach pain-5/10 Patient not having normal bowel movements-indicates she either has diarrhea or soft, green stool Patient afraid to eat at regular intervals due to indigestion, diarrhea, nausea. Patient eating once every other day. Eating a soft, bland diet

## 2015-01-23 NOTE — Patient Instructions (Signed)
Gastroesophageal Reflux Disease, Adult Gastroesophageal reflux disease (GERD) happens when acid from your stomach flows up into the esophagus. When acid comes in contact with the esophagus, the acid causes soreness (inflammation) in the esophagus. Over time, GERD may create small holes (ulcers) in the lining of the esophagus. CAUSES   Increased body weight. This puts pressure on the stomach, making acid rise from the stomach into the esophagus.  Smoking. This increases acid production in the stomach.  Drinking alcohol. This causes decreased pressure in the lower esophageal sphincter (valve or ring of muscle between the esophagus and stomach), allowing acid from the stomach into the esophagus.  Late evening meals and a full stomach. This increases pressure and acid production in the stomach.  A malformed lower esophageal sphincter. Sometimes, no cause is found. SYMPTOMS   Burning pain in the lower part of the mid-chest behind the breastbone and in the mid-stomach area. This may occur twice a week or more often.  Trouble swallowing.  Sore throat.  Dry cough.  Asthma-like symptoms including chest tightness, shortness of breath, or wheezing. DIAGNOSIS  Your caregiver may be able to diagnose GERD based on your symptoms. In some cases, X-rays and other tests may be done to check for complications or to check the condition of your stomach and esophagus. TREATMENT  Your caregiver may recommend over-the-counter or prescription medicines to help decrease acid production. Ask your caregiver before starting or adding any new medicines.  HOME CARE INSTRUCTIONS   Change the factors that you can control. Ask your caregiver for guidance concerning weight loss, quitting smoking, and alcohol consumption.  Avoid foods and drinks that make your symptoms worse, such as:  Caffeine or alcoholic drinks.  Chocolate.  Peppermint or mint flavorings.  Garlic and onions.  Spicy foods.  Citrus fruits,  such as oranges, lemons, or limes.  Tomato-based foods such as sauce, chili, salsa, and pizza.  Fried and fatty foods.  Avoid lying down for the 3 hours prior to your bedtime or prior to taking a nap.  Eat small, frequent meals instead of large meals.  Wear loose-fitting clothing. Do not wear anything tight around your waist that causes pressure on your stomach.  Raise the head of your bed 6 to 8 inches with wood blocks to help you sleep. Extra pillows will not help.  Only take over-the-counter or prescription medicines for pain, discomfort, or fever as directed by your caregiver.  Do not take aspirin, ibuprofen, or other nonsteroidal anti-inflammatory drugs (NSAIDs). SEEK IMMEDIATE MEDICAL CARE IF:   You have pain in your arms, neck, jaw, teeth, or back.  Your pain increases or changes in intensity or duration.  You develop nausea, vomiting, or sweating (diaphoresis).  You develop shortness of breath, or you faint.  Your vomit is green, yellow, black, or looks like coffee grounds or blood.  Your stool is red, bloody, or black. These symptoms could be signs of other problems, such as heart disease, gastric bleeding, or esophageal bleeding. MAKE SURE YOU:   Understand these instructions.  Will watch your condition.  Will get help right away if you are not doing well or get worse. Document Released: 09/21/2005 Document Revised: 03/05/2012 Document Reviewed: 07/01/2011 ExitCare Patient Information 2015 [employer]. This information is not intended to replace advice given to you by your health care provider. Make sure you discuss any questions you have with your health care provider.  

## 2015-01-23 NOTE — Progress Notes (Signed)
Patient ID: Misty Bailey, female   DOB: [birth year], 44 y.o.   MRN: [id number]  CC: indigestion  HPI: Misty Bailey is a 45 y.o. female here today for a follow up visit.  Patient has past medical history of suppurative hidradenitis and depression.  Patient presents to clinic today with complaints of indigestion and upset stomach for the past two months.  She has been having symptoms of nausea and diarrhea.  Patient is concerned because she has only been able to eat every other day.  She reports that she has always had problems with indigestion but is becoming worse, sour burps, persistent nausea. Last normal BM was 2 months ago. Each time after using baking soda for indigestion she notices the diarrhea.   No blood in stool, vomit, fever, chills, dysuria, loss of weight. No past abdominal surgeries. LMP 1/28.    Allergies  Allergen Reactions  . Codeine Shortness Of Breath   Past Medical History  Diagnosis Date  . Suppurative hidradenitis   . Anemia   . Panic attacks   . Depression   . Arthritis 02/2014    Rib Cage   Current Outpatient Prescriptions on File Prior to Visit  Medication Sig Dispense Refill  . cyclobenzaprine (FLEXERIL) 10 MG tablet Take 1 tablet (10 mg total) by mouth at bedtime. 30 tablet 0  . metroNIDAZOLE (METROGEL) 0.75 % vaginal gel Place 1 Applicatorful vaginally at bedtime. 70 g 0  . naproxen (NAPROSYN) 500 MG tablet Take 500 mg by mouth as needed.     No current facility-administered medications on file prior to visit.   Family History  Problem Relation Age of Onset  . Diabetes Mother   . Hypertension Mother   . Hyperlipidemia Mother   . Hypertension Father   . Stroke Father   . Hyperlipidemia Father   . Hypertension Maternal Grandmother   . Diabetes Maternal Grandmother   . Heart murmur Mother   . Thyroid disease Paternal Aunt   . Osteoporosis Maternal Grandmother    History   Social History  . Marital Status: Single    Spouse Name: N/A   Number of Children: N/A  . Years of Education: N/A   Occupational History  . Not on file.   Social History Main Topics  . Smoking status: Former Research scientist (life sciences)  . Smokeless tobacco: Current User     Comment: Weed  . Alcohol Use: No     Comment: Occassionally   . Drug Use: No  . Sexual Activity:    Partners: Male    Birth Control/ Protection: None   Other Topics Concern  . Not on file   Social History Narrative   Boyfriend incarcerated 2014-2018    Review of Systems: See HPI   Objective:   Filed Vitals:   01/23/15 1539  BP: 150/89  Pulse: 77  Resp: 20    Physical Exam  Cardiovascular: Normal rate, regular rhythm and normal heart sounds.   Pulmonary/Chest: Effort normal and breath sounds normal.  Abdominal: Soft. Bowel sounds are normal. She exhibits no distension. There is no tenderness.     Lab Results  Component Value Date   WBC 5.2 08/09/2013   HGB 12.7 08/09/2013   HCT 39.5 08/09/2013   MCV 89.0 08/09/2013   PLT 300 08/09/2013   Lab Results  Component Value Date   CREATININE 0.86 03/20/2014   BUN 10 03/20/2014   NA 140 03/20/2014   K 4.3 03/20/2014   CL 108 03/20/2014   CO2 26 03/20/2014  Lab Results  Component Value Date   HGBA1C 5.8 03/20/2014   Lipid Panel  No results found for: CHOL, TRIG, HDL, CHOLHDL, VLDL, LDLCALC     Assessment and plan:   Misty Bailey was seen today for diarrhea and nausea.  Diagnoses and all orders for this visit:  Acid indigestion Orders: -     ranitidine (ZANTAC) 150 MG tablet; Take 1 tablet (150 mg total) by mouth 2 (two) times daily. -     ondansetron (ZOFRAN) 4 MG tablet; Take 1 tablet (4 mg total) by mouth every 8 (eight) hours as needed for nausea or vomiting.  Return if symptoms worsen or fail to improve.       Chari Manning, NP-C [facility] [phone] 01/23/2015, 3:46 PM

## 2015-02-03 ENCOUNTER — Encounter: Payer: Self-pay | Admitting: Nurse Practitioner

## 2015-03-06 ENCOUNTER — Ambulatory Visit: Payer: Self-pay | Attending: Internal Medicine | Admitting: Family Medicine

## 2015-03-06 VITALS — BP 130/83 | HR 104 | Temp 98.9°F | Resp 20

## 2015-03-06 DIAGNOSIS — R062 Wheezing: Secondary | ICD-10-CM

## 2015-03-06 DIAGNOSIS — J069 Acute upper respiratory infection, unspecified: Secondary | ICD-10-CM | POA: Insufficient documentation

## 2015-03-06 MED ORDER — ALBUTEROL SULFATE (2.5 MG/3ML) 0.083% IN NEBU
2.5000 mg | INHALATION_SOLUTION | Freq: Once | RESPIRATORY_TRACT | Status: AC
Start: 2015-03-06 — End: 2015-03-06
  Administered 2015-03-06: 2.5 mg via RESPIRATORY_TRACT

## 2015-03-06 MED ORDER — GUAIFENESIN-CODEINE 100-10 MG/5ML PO SYRP: 5.0000 mL | ORAL_SOLUTION | Freq: Three times a day (TID) | ORAL | Status: DC | PRN

## 2015-03-06 MED ORDER — ALBUTEROL SULFATE (2.5 MG/3ML) 0.083% IN NEBU: 2.5000 mg | INHALATION_SOLUTION | Freq: Once | RESPIRATORY_TRACT | Status: DC

## 2015-03-06 MED ORDER — ALBUTEROL SULFATE HFA 108 (90 BASE) MCG/ACT IN AERS: 2.0000 | INHALATION_SPRAY | Freq: Four times a day (QID) | RESPIRATORY_TRACT | Status: DC | PRN

## 2015-03-06 NOTE — Progress Notes (Signed)
Subjective:     Patient ID: Misty Bailey, female   DOB: 1971-12-11, 44 y.o.   MRN: [id number]  HPI  Patient with not history of asthma presents with wheezing and cough for 3 days. She also has ST and nasal congestion. She has had a history of bronchitis in the past and does have an inhaler but has not used. She has a history of suppurative hidandentis, costocondritis.She reports having difficult breathing especially with coughing.  Review of Systems Denies, fever, chills, others negative except for as in HPI     Objective:   Physical Exam   General:  Alert, oriented, appropriate, in no distress. HEENT:  TMS clear, nose minorly congested. Throat with mild erythema, neck is   Supple FROM w/o adenopathy or tenderness. Lungs:  With scattered faint wheezes worse over the upper airway anteriorly. Her pulse ox is 98%              Respiratory effort is slightly labored. Heart:   HS are regular w/o m,g,r.    Assessment:     1. Upper respiratory infection with cough and wheezing.    Plan:     1.  Nebulizer treatment here with slight improvement of wheezing. 2.  Albuterol inhaler for use qid as needed for next few days. 3.  Cheratussin for cough q 4-6 hours prn cough\ 4.  ED if necessary for respiratory distress.

## 2015-03-06 NOTE — Progress Notes (Signed)
Patient presents as a walk in this afternoon c/o sob,cough,sore throat,fever for two days.  Patient vitals entered into Epic and all wnl.  Patient has audible expiratory wheezes.

## 2015-03-06 NOTE — Patient Instructions (Signed)
Use cough syrup and inhaler as needed and prescribed. Drink plenty of fluids May need to sleep with head elevated Breath wam steam as needed.Acute Bronchitis Bronchitis is inflammation of the airways that extend from the windpipe into the lungs (bronchi). The inflammation often causes mucus to develop. This leads to a cough, which is the most common symptom of bronchitis.  In acute bronchitis, the condition usually develops suddenly and goes away over time, usually in a couple weeks. Smoking, allergies, and asthma can make bronchitis worse. Repeated episodes of bronchitis may cause further lung problems.  CAUSES Acute bronchitis is most often caused by the same virus that causes a cold. The virus can spread from person to person (contagious) through coughing, sneezing, and touching contaminated objects. SIGNS AND SYMPTOMS   Cough.   Fever.   Coughing up mucus.   Body aches.   Chest congestion.   Chills.   Shortness of breath.   Sore throat.  DIAGNOSIS  Acute bronchitis is usually diagnosed through a physical exam. Your health care provider will also ask you questions about your medical history. Tests, such as chest X-rays, are sometimes done to rule out other conditions.  TREATMENT  Acute bronchitis usually goes away in a couple weeks. Oftentimes, no medical treatment is necessary. Medicines are sometimes given for relief of fever or cough. Antibiotic medicines are usually not needed but may be prescribed in certain situations. In some cases, an inhaler may be recommended to help reduce shortness of breath and control the cough. A cool mist vaporizer may also be used to help thin bronchial secretions and make it easier to clear the chest.  HOME CARE INSTRUCTIONS  Get plenty of rest.   Drink enough fluids to keep your urine clear or pale yellow (unless you have a medical condition that requires fluid restriction). Increasing fluids may help thin your respiratory secretions  (sputum) and reduce chest congestion, and it will prevent dehydration.   Take medicines only as directed by your health care provider.  If you were prescribed an antibiotic medicine, finish it all even if you start to feel better.  Avoid smoking and secondhand smoke. Exposure to cigarette smoke or irritating chemicals will make bronchitis worse. If you are a smoker, consider using nicotine gum or skin patches to help control withdrawal symptoms. Quitting smoking will help your lungs heal faster.   Reduce the chances of another bout of acute bronchitis by washing your hands frequently, avoiding people with cold symptoms, and trying not to touch your hands to your mouth, nose, or eyes.   Keep all follow-up visits as directed by your health care provider.  SEEK MEDICAL CARE IF: Your symptoms do not improve after 1 week of treatment.  SEEK IMMEDIATE MEDICAL CARE IF:  You develop an increased fever or chills.   You have chest pain.   You have severe shortness of breath.  You have bloody sputum.   You develop dehydration.  You faint or repeatedly feel like you are going to pass out.  You develop repeated vomiting.  You develop a severe headache. MAKE SURE YOU:   Understand these instructions.  Will watch your condition.  Will get help right away if you are not doing well or get worse. Document Released: 01/19/2005 Document Revised: 04/28/2014 Document Reviewed: 06/04/2013 [facility] Patient Information 2015 [place], [place]. This information is not intended to replace advice given to you by your health care provider. Make sure you discuss any questions you have with your health care provider.

## 2015-03-25 ENCOUNTER — Ambulatory Visit: Payer: BC Managed Care – PPO | Admitting: Gynecology

## 2015-12-18 ENCOUNTER — Ambulatory Visit: Payer: Self-pay | Admitting: Internal Medicine

## 2016-02-23 ENCOUNTER — Other Ambulatory Visit: Payer: Self-pay | Admitting: Internal Medicine

## 2016-04-08 ENCOUNTER — Ambulatory Visit (HOSPITAL_COMMUNITY)
Admission: EM | Admit: 2016-04-08 | Discharge: 2016-04-08 | Disposition: A | Discharge status: Home / Self Care | Payer: 59 | Attending: Emergency Medicine | Admitting: Emergency Medicine

## 2016-04-08 ENCOUNTER — Encounter (HOSPITAL_COMMUNITY): Payer: Self-pay

## 2016-04-08 DIAGNOSIS — K0889 Other specified disorders of teeth and supporting structures: Secondary | ICD-10-CM

## 2016-04-08 DIAGNOSIS — K047 Periapical abscess without sinus: Secondary | ICD-10-CM | POA: Diagnosis not present

## 2016-04-08 MED ORDER — HYDROCODONE-ACETAMINOPHEN 5-325 MG PO TABS
1.0000 | ORAL_TABLET | Freq: Once | ORAL | Status: AC
  Administered 2016-04-08: 1 via ORAL

## 2016-04-08 MED ORDER — HYDROCODONE-ACETAMINOPHEN 5-325 MG PO TABS: 1.0000 | ORAL_TABLET | Freq: Four times a day (QID) | ORAL | Status: DC | PRN

## 2016-04-08 MED ORDER — HYDROCODONE-ACETAMINOPHEN 5-325 MG PO TABS
ORAL_TABLET | ORAL | Status: AC
  Filled 2016-04-08: qty 1

## 2016-04-08 MED ORDER — KETOROLAC TROMETHAMINE 60 MG/2ML IM SOLN
60.0000 mg | Freq: Once | INTRAMUSCULAR | Status: AC
  Administered 2016-04-08: 60 mg via INTRAMUSCULAR

## 2016-04-08 MED ORDER — KETOROLAC TROMETHAMINE 60 MG/2ML IM SOLN
INTRAMUSCULAR | Status: AC
  Filled 2016-04-08: qty 2

## 2016-04-08 MED ORDER — PENICILLIN V POTASSIUM 500 MG PO TABS: 500.0000 mg | ORAL_TABLET | Freq: Four times a day (QID) | ORAL | Status: AC

## 2016-04-08 MED ORDER — IBUPROFEN 600 MG PO TABS: 600.0000 mg | ORAL_TABLET | Freq: Four times a day (QID) | ORAL | Status: DC | PRN

## 2016-04-08 NOTE — ED Provider Notes (Signed)
CSN: [id number]     Arrival date & time 04/08/16  1348 History   First MD Initiated Contact with Patient 04/08/16 1557     Chief Complaint  Patient presents with  . Dental Pain   (Consider location/radiation/quality/duration/timing/severity/associated sxs/prior Treatment) HPI  She is a 45 year old woman here for evaluation of dental pain. Pain started acutely last night in the bottom right posterior molars. She states feelings have fallen out of these teeth due to worsening decay. She was told that the teeth would need to be pulled, but has been going back and forth with her insurance about this. She states they had cold and numb, but do not hurt. Last night she developed severe pain of these teeth. It is worse with chewing and cold. She has tried Tylenol and Orajel without improvement.  Past Medical History  Diagnosis Date  . Suppurative hidradenitis   . Anemia   . Panic attacks   . Depression   . Arthritis 02/2014    Rib Cage   Past Surgical History  Procedure Laterality Date  . Pelvic laparoscopy  1998/1999 `    pelvic adhesions and pain   Family History  Problem Relation Age of Onset  . Diabetes Mother   . Hypertension Mother   . Hyperlipidemia Mother   . Hypertension Father   . Stroke Father   . Hyperlipidemia Father   . Hypertension Maternal Grandmother   . Diabetes Maternal Grandmother   . Heart murmur Mother   . Thyroid disease Paternal Aunt   . Osteoporosis Maternal Grandmother    Social History  Substance Use Topics  . Smoking status: Former Research scientist (life sciences)  . Smokeless tobacco: Current User     Comment: Weed  . Alcohol Use: No     Comment: Occassionally    OB History    Gravida Para Term Preterm AB TAB SAB Ectopic Multiple Living   2 1  1 1  1    0     Review of Systems As in history of present illness Allergies  Codeine  Home Medications   Prior to Admission medications   Medication Sig Start Date End Date Taking? Authorizing Provider  albuterol  (PROVENTIL HFA;VENTOLIN HFA) 108 (90 BASE) MCG/ACT inhaler Inhale 2 puffs into the lungs every 6 (six) hours as needed for wheezing or shortness of breath. 03/06/15   Micheline Chapman, NP  cyclobenzaprine (FLEXERIL) 10 MG tablet Take 1 tablet (10 mg total) by mouth at bedtime. 08/13/14   Lance Bosch, NP  guaiFENesin-codeine (CHERATUSSIN AC) 100-10 MG/5ML syrup Take 5 mLs by mouth 3 (three) times daily as needed for cough. 03/06/15   Micheline Chapman, NP  HYDROcodone-acetaminophen (NORCO) 5-325 MG tablet Take 1 tablet by mouth every 6 (six) hours as needed for moderate pain. 04/08/16   Melony Overly, MD  ibuprofen (ADVIL,MOTRIN) 600 MG tablet Take 1 tablet (600 mg total) by mouth every 6 (six) hours as needed for moderate pain. 04/08/16   Melony Overly, MD  naproxen (NAPROSYN) 500 MG tablet Take 500 mg by mouth as needed. 03/26/14   Renella Cunas, MD  ondansetron (ZOFRAN) 4 MG tablet Take 1 tablet (4 mg total) by mouth every 8 (eight) hours as needed for nausea or vomiting. 01/23/15   Lance Bosch, NP  penicillin v potassium (VEETID) 500 MG tablet Take 1 tablet (500 mg total) by mouth 4 (four) times daily. 04/08/16 04/15/16  Melony Overly, MD  ranitidine (ZANTAC) 150 MG tablet Take 1 tablet (  150 mg total) by mouth 2 (two) times daily. Must have office visit for refills 02/24/16   Lance Bosch, NP   Meds Ordered and Administered this Visit   Medications  ketorolac (TORADOL) injection 60 mg (60 mg Intramuscular Given 04/08/16 1622)  HYDROcodone-acetaminophen (NORCO/VICODIN) 5-325 MG per tablet 1 tablet (1 tablet Oral Given 04/08/16 1622)    BP 164/87 mmHg  Pulse 75  Temp(Src) 98.3 F (36.8 C) (Oral)  Resp 16  SpO2 100%  LMP 03/26/2016 (Exact Date) No data found.   Physical Exam  Constitutional: She is oriented to person, place, and time. She appears well-developed and well-nourished. She appears distressed (tearful).  HENT:  Mouth/Throat:    Cardiovascular: Normal rate.   Pulmonary/Chest:  Effort normal.  Neurological: She is alert and oriented to person, place, and time.    ED Course  Procedures (including critical care time)  Labs Review Labs Reviewed - No data to display  Imaging Review No results found.    MDM   1. Pain, dental   2. Dental infection    We'll treat with penicillin, ibuprofen, and hydrocodone. Toradol and Norco given prior to discharge. She will call her dentist Monday morning for an appointment.    Melony Overly, MD 04/08/16 [phone]

## 2016-04-08 NOTE — Discharge Instructions (Signed)
You likely have an infection around her tooth. Take penicillin 4 times a day for 10 days. Take ibuprofen 600 mg every 6 hours for pain. Start this medicine tomorrow. Use hydrocodone every 4-6 hours as needed for severe pain. Do not drive while taking this medicine. Follow-up with your dentist as soon as possible.

## 2016-04-08 NOTE — ED Notes (Signed)
Patient presents with tooth pain right side bottom back two, she has taken tylenol 500 mg x4, has had pain since last night.

## 2017-01-13 ENCOUNTER — Other Ambulatory Visit: Payer: Self-pay | Admitting: Obstetrics & Gynecology

## 2017-01-13 DIAGNOSIS — Z1231 Encounter for screening mammogram for malignant neoplasm of breast: Secondary | ICD-10-CM

## 2017-01-27 ENCOUNTER — Ambulatory Visit: Payer: 59

## 2017-02-10 ENCOUNTER — Ambulatory Visit
Admission: RE | Admit: 2017-02-10 | Discharge: 2017-02-10 | Disposition: A | Discharge status: Home / Self Care | Payer: 59 | Source: Ambulatory Visit | Attending: Obstetrics & Gynecology | Admitting: Obstetrics & Gynecology

## 2017-02-10 DIAGNOSIS — Z1231 Encounter for screening mammogram for malignant neoplasm of breast: Secondary | ICD-10-CM

## 2017-05-26 ENCOUNTER — Ambulatory Visit (INDEPENDENT_AMBULATORY_CARE_PROVIDER_SITE_OTHER): Payer: 59 | Admitting: Podiatry

## 2017-05-26 ENCOUNTER — Encounter: Payer: Self-pay | Admitting: Podiatry

## 2017-05-26 ENCOUNTER — Ambulatory Visit (INDEPENDENT_AMBULATORY_CARE_PROVIDER_SITE_OTHER): Payer: 59

## 2017-05-26 VITALS — BP 139/82 | HR 67 | Resp 16 | Ht 61.0 in | Wt 140.0 lb

## 2017-05-26 DIAGNOSIS — M2022 Hallux rigidus, left foot: Secondary | ICD-10-CM | POA: Diagnosis not present

## 2017-05-26 DIAGNOSIS — M79672 Pain in left foot: Secondary | ICD-10-CM | POA: Diagnosis not present

## 2017-05-26 NOTE — Progress Notes (Signed)
   Subjective:    Patient ID: Misty Bailey, female    DOB: April 08, 1971, 46 y.o.   MRN: [id number]  HPI Chief Complaint  Patient presents with  . Foot Pain    Left foot; dorsal; x1 yr  . Toe Pain    Left foot; great toe-bottom of toe; pt stated, "Can't move toe; can't bend toe; throbs at night"; x1 yr      Review of Systems  All other systems reviewed and are negative.      Objective:   Physical Exam        Assessment & Plan:

## 2017-05-26 NOTE — Progress Notes (Signed)
Subjective:    Patient ID: Misty Bailey, female   DOB: 46 y.o.   MRN: [id number]   HPI patient states she's getting a lot of pain in the big toe joint left and that it's been going on for over a year getting worse and making increasingly difficult for her to walk. She's tried shoe gear modifications wider shoes soaks and anti-inflammatories without relief of symptoms    Review of Systems  All other systems reviewed and are negative.       Objective:  Physical Exam  Cardiovascular: Intact distal pulses.   Neurological: She is alert.  Skin: Skin is warm.  Nursing note and vitals reviewed.  neurovascular status intact muscle strength was adequate range of motion within normal limits with patient found to have significant inflammation pain around the first metatarsophalangeal joint left with fluid buildup diminished motion upon movement and mild to moderate crepitus within the joint. Patient's noted to have good digital perfusion and is well oriented 3 and had no other significant foot pathology     Assessment:    Inflammatory hallux limitus rigidus condition left with worsening of symptoms over a year and inability to walk on that side of her foot     Plan:    H&P conditions reviewed and discussed treatment options including injection with orthotics versus consideration for surgical intervention. Patient wants surgery and I've recommended a biplanar-type osteotomy with removal of spurring explained procedure and risk and she wants to get this done. She'll reappoint for consult in the next 2 weeks with surgery scheduled for 1 month  X-ray indicated there is narrowing of the joint surface first MPJ left with spur formation dorsally

## 2017-05-30 ENCOUNTER — Telehealth: Payer: Self-pay | Admitting: *Deleted

## 2017-05-30 NOTE — Telephone Encounter (Addendum)
Misty Bailey - [employer] is looking for the Attending Physicians Statement for short-term disability.06/01/2017-Wendy - [employer] request information concerning Attending Physician Statement.

## 2017-06-09 ENCOUNTER — Encounter: Payer: Self-pay | Admitting: Podiatry

## 2017-06-09 ENCOUNTER — Ambulatory Visit (INDEPENDENT_AMBULATORY_CARE_PROVIDER_SITE_OTHER): Payer: 59 | Admitting: Podiatry

## 2017-06-09 ENCOUNTER — Ambulatory Visit: Payer: 59 | Attending: Internal Medicine | Admitting: Internal Medicine

## 2017-06-09 ENCOUNTER — Ambulatory Visit (HOSPITAL_COMMUNITY)
Admission: RE | Admit: 2017-06-09 | Discharge: 2017-06-09 | Disposition: A | Discharge status: Home / Self Care | Payer: 59 | Source: Ambulatory Visit | Attending: Internal Medicine | Admitting: Internal Medicine

## 2017-06-09 ENCOUNTER — Other Ambulatory Visit: Payer: Self-pay

## 2017-06-09 ENCOUNTER — Encounter: Payer: Self-pay | Admitting: Internal Medicine

## 2017-06-09 VITALS — BP 126/83 | HR 73 | Temp 98.6°F | Resp 17 | Ht 62.75 in | Wt 136.0 lb

## 2017-06-09 DIAGNOSIS — N898 Other specified noninflammatory disorders of vagina: Secondary | ICD-10-CM | POA: Diagnosis not present

## 2017-06-09 DIAGNOSIS — R079 Chest pain, unspecified: Secondary | ICD-10-CM

## 2017-06-09 DIAGNOSIS — J302 Other seasonal allergic rhinitis: Secondary | ICD-10-CM | POA: Insufficient documentation

## 2017-06-09 DIAGNOSIS — L732 Hidradenitis suppurativa: Secondary | ICD-10-CM | POA: Insufficient documentation

## 2017-06-09 DIAGNOSIS — M2022 Hallux rigidus, left foot: Secondary | ICD-10-CM

## 2017-06-09 DIAGNOSIS — F329 Major depressive disorder, single episode, unspecified: Secondary | ICD-10-CM | POA: Diagnosis not present

## 2017-06-09 DIAGNOSIS — R05 Cough: Secondary | ICD-10-CM | POA: Insufficient documentation

## 2017-06-09 DIAGNOSIS — Z87891 Personal history of nicotine dependence: Secondary | ICD-10-CM | POA: Diagnosis not present

## 2017-06-09 DIAGNOSIS — H578 Other specified disorders of eye and adnexa: Secondary | ICD-10-CM | POA: Insufficient documentation

## 2017-06-09 DIAGNOSIS — M940 Chondrocostal junction syndrome [Tietze]: Secondary | ICD-10-CM | POA: Diagnosis not present

## 2017-06-09 DIAGNOSIS — B009 Herpesviral infection, unspecified: Secondary | ICD-10-CM | POA: Diagnosis not present

## 2017-06-09 DIAGNOSIS — R0602 Shortness of breath: Secondary | ICD-10-CM | POA: Insufficient documentation

## 2017-06-09 MED ORDER — ALBUTEROL SULFATE HFA 108 (90 BASE) MCG/ACT IN AERS
2.0000 | INHALATION_SPRAY | Freq: Four times a day (QID) | RESPIRATORY_TRACT | 4 refills | Status: DC | PRN
Start: 2017-06-09 — End: 2018-01-25

## 2017-06-09 MED ORDER — FLUTICASONE PROPIONATE 50 MCG/ACT NA SUSP: 2.0000 | Freq: Every day | NASAL | 6 refills | Status: DC

## 2017-06-09 MED ORDER — IBUPROFEN 800 MG PO TABS
800.0000 mg | ORAL_TABLET | Freq: Three times a day (TID) | ORAL | 1 refills | Status: DC | PRN
Start: 2017-06-09 — End: 2018-06-01

## 2017-06-09 NOTE — Patient Instructions (Addendum)
Use the Flonase Nasal spray with your allergy pill daily as discussed.  Use the Ibuprofen as needed for chest pain.  Take it with food.   I have referred you for lung function test to evaluate for asthma.   Allergic Rhinitis Allergic rhinitis is when the mucous membranes in the nose respond to allergens. Allergens are particles in the air that cause your body to have an allergic reaction. This causes you to release allergic antibodies. Through a chain of events, these eventually cause you to release histamine into the blood stream. Although meant to protect the body, it is this release of histamine that causes your discomfort, such as frequent sneezing, congestion, and an itchy, runny nose. What are the causes? Seasonal allergic rhinitis (hay fever) is caused by pollen allergens that may come from grasses, trees, and weeds. Year-round allergic rhinitis (perennial allergic rhinitis) is caused by allergens such as house dust mites, pet dander, and mold spores. What are the signs or symptoms?  Nasal stuffiness (congestion).  Itchy, runny nose with sneezing and tearing of the eyes. How is this diagnosed? Your health care provider can help you determine the allergen or allergens that trigger your symptoms. If you and your health care provider are unable to determine the allergen, skin or blood testing may be used. Your health care provider will diagnose your condition after taking your health history and performing a physical exam. Your health care provider may assess you for other related conditions, such as asthma, pink eye, or an ear infection. How is this treated? Allergic rhinitis does not have a cure, but it can be controlled by:  Medicines that block allergy symptoms. These may include allergy shots, nasal sprays, and oral antihistamines.  Avoiding the allergen.  Hay fever may often be treated with antihistamines in pill or nasal spray forms. Antihistamines block the effects of histamine.  There are over-the-counter medicines that may help with nasal congestion and swelling around the eyes. Check with your health care provider before taking or giving this medicine. If avoiding the allergen or the medicine prescribed do not work, there are many new medicines your health care provider can prescribe. Stronger medicine may be used if initial measures are ineffective. Desensitizing injections can be used if medicine and avoidance does not work. Desensitization is when a patient is given ongoing shots until the body becomes less sensitive to the allergen. Make sure you follow up with your health care provider if problems continue. Follow these instructions at home: It is not possible to completely avoid allergens, but you can reduce your symptoms by taking steps to limit your exposure to them. It helps to know exactly what you are allergic to so that you can avoid your specific triggers. Contact a health care provider if:  You have a fever.  You develop a cough that does not stop easily (persistent).  You have shortness of breath.  You start wheezing.  Symptoms interfere with normal daily activities. This information is not intended to replace advice given to you by your health care provider. Make sure you discuss any questions you have with your health care provider. Document Released: 09/06/2001 Document Revised: 08/12/2016 Document Reviewed: 08/19/2013 Elsevier Interactive Patient Education  2017 [employer].

## 2017-06-09 NOTE — Progress Notes (Signed)
Subjective:    Patient ID: Misty Bailey, female   DOB: 46 y.o.   MRN: [id number]   HPI patient presents for consult concerning correction of her left foot stating the big toe joint is increasingly sore    ROS      Objective:  Physical Exam neurovascular status intact negative Homans sign with patient noted to have exquisite discomfort first MPJ left with reduced range of motion and pain around the joint surface     Assessment:    Hallux limitus rigidus condition left that has not responded to shoe gear modification reduced activity     Plan:    H&P condition reviewed. I allowed her to read consent form going over alternative treatments complications and the fact that there is no long-term guarantees far success with procedure such as this. Patient wants surgery understanding all risk and at this time signs consent form and I also explained that there could be cartilage damage and that ultimately this could require joint implantation or fusion procedure. Air fracture walker dispensed and she'll begin wearing it preoperatively and was given instructions on how to balance with her right foot

## 2017-06-09 NOTE — Progress Notes (Signed)
Patient ID: Misty Bailey, female    DOB: 05/12/1971  MRN: [id number]  CC: Establish Care; Menstrual Problem; and Arthritis (In chest)   Subjective: Misty Bailey is a 46 y.o. female who presents to reestablish care. Her concerns today include:  1. C/o intermittent SOB and coughing  a few months.. Given albuterol inhaler 2 years ago at an urgent care visit for respiratory symptoms at which time she had notable wheezing on auscultation. She was given an albuterol inhaler which she has used intermittently over the past 2 years. She is currently out -No wheezing recently.  No fevers. -Reports she has shortness of breath mainly when going up the stairs at work. Takes Ventolin inhaler once she gets to the top of the stairs and "I can breath better." -has appt to see allergist end of this mth.  "Allergies really bad" - sneezing, face and eye itching and sinus congestion. taking an OTC 24 hr allergy pill with some improvement Took allergy shots as child  2. Reports being diagnosed with costochondritis several years ago. Feels costochodritis has flared up again -Chest feels sore and is very tender to touch on the left side. Soreness is worse at nights -Denies any chest pain on exertion   Patient Active Problem List   Diagnosis Date Noted  . Costochondritis 03/26/2014  . Herpes 03/21/2014  . Suppurative hidradenitis   . Depression   . Pap smear for cervical cancer screening 08/09/2013  . PE (physical exam), annual 08/09/2013  . Vaginal discharge 08/09/2013     No current outpatient prescriptions on file prior to visit.   No current facility-administered medications on file prior to visit.     Allergies  Allergen Reactions  . Codeine Shortness Of Breath    Social History   Social History  . Marital status: Single    Spouse name: N/A  . Number of children: N/A  . Years of education: N/A   Occupational History  . Not on file.   Social History Main Topics  . Smoking  status: Former Research scientist (life sciences)  . Smokeless tobacco: Current User     Comment: Weed  . Alcohol use No     Comment: Occassionally   . Drug use: No  . Sexual activity: Not Currently    Partners: Male    Birth control/ protection: None   Other Topics Concern  . Not on file   Social History Narrative   Boyfriend incarcerated 2014-2018    Family History  Problem Relation Age of Onset  . Diabetes Mother   . Hypertension Mother   . Hyperlipidemia Mother   . Heart murmur Mother   . Hypertension Father   . Stroke Father   . Hyperlipidemia Father   . Hypertension Maternal Grandmother   . Diabetes Maternal Grandmother   . Osteoporosis Maternal Grandmother   . Thyroid disease Paternal Aunt     Past Surgical History:  Procedure Laterality Date  . PELVIC LAPAROSCOPY  1998/1999 `   pelvic adhesions and pain    ROS: Review of Systems As stated above PHYSICAL EXAM: BP 126/83 (BP Location: Right Arm, Patient Position: Sitting, Cuff Size: Normal)   Pulse 73   Temp 98.6 F (37 C) (Oral)   Resp 17   Ht 5' 2.75" (1.594 m)   Wt 136 lb (61.7 kg)   LMP 05/20/2017   SpO2 99%   BMI 24.28 kg/m   Physical Exam  General appearance - alert, well appearing, and in no distress.  Patient  with noted nasal tone to her voice and sounds congested Mental status - alert, oriented to person, place, and time, normal mood, behavior, speech, dress, motor activity, and thought processes Nose - moderate to severe enlargement of nasal turbinates Mouth - mucous membranes moist, pharynx normal without lesions Neck - supple, no significant adenopathy Chest - clear to auscultation, no wheezes, rales or rhonchi, symmetric air entry Heart - normal rate, regular rhythm, normal S1, S2, no murmurs, rubs, clicks or gallops.  Moderate tenderness on palpation of the left anterior chest close to the sternum Extremities - peripheral pulses normal, no pedal edema, no clubbing or cyanosis  EKG: Normal EKG with sinus  rhythm Depression screen [facility] 2/9 06/09/2017  Decreased Interest 0  Down, Depressed, Hopeless 1  PHQ - 2 Score 1  Altered sleeping 1  Tired, decreased energy 1  Change in appetite 0  Feeling bad or failure about yourself  3  Trouble concentrating 0  Moving slowly or fidgety/restless 0  Suicidal thoughts 0  PHQ-9 Score 6    ASSESSMENT AND PLAN: 1. Chest pain, unspecified type -Consistent with costochondritis - ibuprofen (ADVIL,MOTRIN) 800 MG tablet; Take 1 tablet (800 mg total) by mouth every 8 (eight) hours as needed for moderate pain.  Dispense: 30 tablet; Refill: 1 - EKG 12-Lead  2. Seasonal allergic rhinitis, unspecified trigger -She will continue the over-the-counter allergy pill that she is taking. We'll add an allergy nasal spray. - fluticasone (FLONASE) 50 MCG/ACT nasal spray; Place 2 sprays into both nostrils daily.  Dispense: 16 g; Refill: 6  3. SOB (shortness of breath) -We will refill albuterol but will also get pulmonary function tests as she has not been formally diagnosed with asthma - albuterol (PROVENTIL HFA;VENTOLIN HFA) 108 (90 Base) MCG/ACT inhaler; Inhale 2 puffs into the lungs every 6 (six) hours as needed for wheezing or shortness of breath.  Dispense: 1 Inhaler; Refill: 4 - Pulmonary function test; Future  Patient was given the opportunity to ask questions.  Patient verbalized understanding of the plan and was able to repeat key elements of the plan.   Orders Placed This Encounter  Procedures  . EKG 12-Lead  . Pulmonary function test     Requested Prescriptions   Signed Prescriptions Disp Refills  . fluticasone (FLONASE) 50 MCG/ACT nasal spray 16 g 6    Sig: Place 2 sprays into both nostrils daily.  Marland Kitchen ibuprofen (ADVIL,MOTRIN) 800 MG tablet 30 tablet 1    Sig: Take 1 tablet (800 mg total) by mouth every 8 (eight) hours as needed for moderate pain.  Marland Kitchen albuterol (PROVENTIL HFA;VENTOLIN HFA) 108 (90 Base) MCG/ACT inhaler 1 Inhaler 4    Sig: Inhale 2  puffs into the lungs every 6 (six) hours as needed for wheezing or shortness of breath.    Return in about 6 weeks (around 07/21/2017) for physical/pap.  Karle Plumber, MD, FACP

## 2017-06-09 NOTE — Patient Instructions (Signed)
Pre-Operative Instructions  Congratulations, you have decided to take an important step to improving your quality of life.  You can be assured that the doctors of [facility] will be with you every step of the way.  1. Plan to be at the surgery center/hospital at least 1 (one) hour prior to your scheduled time unless otherwise directed by the surgical center/hospital staff.  You must have a responsible adult accompany you, remain during the surgery and drive you home.  Make sure you have directions to the surgical center/hospital and know how to get there on time. 2. For hospital based surgery you will need to obtain a history and physical form from your family physician within 1 month prior to the date of surgery- we will give you a form for you primary physician.  3. We make every effort to accommodate the date you request for surgery.  There are however, times where surgery dates or times have to be moved.  We will contact you as soon as possible if a change in schedule is required.   4. No Aspirin/Ibuprofen for one week before surgery.  If you are on aspirin, any non-steroidal anti-inflammatory medications (Mobic, Aleve, Ibuprofen) you should stop taking it 7 days prior to your surgery.  You make take Tylenol  For pain prior to surgery.  5. Medications- If you are taking daily heart and blood pressure medications, seizure, reflux, allergy, asthma, anxiety, pain or diabetes medications, make sure the surgery center/hospital is aware before the day of surgery so they may notify you which medications to take or avoid the day of surgery. 6. No food or drink after midnight the night before surgery unless directed otherwise by surgical center/hospital staff. 7. No alcoholic beverages 24 hours prior to surgery.  No smoking 24 hours prior to or 24 hours after surgery. 8. Wear loose pants or shorts- loose enough to fit over bandages, boots, and casts. 9. No slip on shoes, sneakers are best. 10. Bring  your boot with you to the surgery center/hospital.  Also bring crutches or a walker if your physician has prescribed it for you.  If you do not have this equipment, it will be provided for you after surgery. 11. If you have not been contracted by the surgery center/hospital by the day before your surgery, call to confirm the date and time of your surgery. 12. Leave-time from work may vary depending on the type of surgery you have.  Appropriate arrangements should be made prior to surgery with your employer. 13. Prescriptions will be provided immediately following surgery by your doctor.  Have these filled as soon as possible after surgery and take the medication as directed. 14. Remove nail polish on the operative foot. 15. Wash the night before surgery.  The night before surgery wash the foot and leg well with the antibacterial soap provided and water paying special attention to beneath the toenails and in between the toes.  Rinse thoroughly with water and dry well with a towel.  Perform this wash unless told not to do so by your physician.  Enclosed: 1 Ice pack (please put in freezer the night before surgery)   1 Hibiclens skin cleaner   Pre-op Instructions  If you have any questions regarding the instructions, do not hesitate to call our office.  [place]: [address] [place], [place] [address] [phone]  [place]: [address] [place], [place] [address] [phone]  [place]: [address]  [place], [place] [address] [phone]   Dr.   Norman Regal DPM, Dr. Matthew Wagoner DPM, Dr. M. Todd Hyatt DPM, Dr. Titorya Stover DPM 

## 2017-06-26 ENCOUNTER — Telehealth: Payer: Self-pay | Admitting: *Deleted

## 2017-06-26 NOTE — Telephone Encounter (Signed)
"  I am scheduled for surgery tomorrow, July 3rd.  I'm on my lunch break now.  I don't go back until 3 pm.  If you are available and can call me before 3 pm that would be great.  If not, my last break is at 4:30 pm.  Some of the answers you can leave on my answering machine.  Am I going to have stitches?  What's the exact name of the surgery?  Will I be able to take a shower?  What are the after care instructions?

## 2017-06-27 ENCOUNTER — Encounter: Payer: Self-pay | Admitting: Podiatry

## 2017-06-27 DIAGNOSIS — M2022 Hallux rigidus, left foot: Secondary | ICD-10-CM | POA: Diagnosis not present

## 2017-06-30 NOTE — Progress Notes (Signed)
DOS 07.03.2018 Bi-Planar [place] (cutting and moving bone) with pin fixation left.

## 2017-07-03 ENCOUNTER — Encounter: Payer: Self-pay | Admitting: Podiatry

## 2017-07-03 ENCOUNTER — Ambulatory Visit (INDEPENDENT_AMBULATORY_CARE_PROVIDER_SITE_OTHER): Payer: 59

## 2017-07-03 ENCOUNTER — Ambulatory Visit (INDEPENDENT_AMBULATORY_CARE_PROVIDER_SITE_OTHER): Payer: 59 | Admitting: Podiatry

## 2017-07-03 VITALS — Temp 98.0°F

## 2017-07-03 DIAGNOSIS — M2022 Hallux rigidus, left foot: Secondary | ICD-10-CM

## 2017-07-03 MED ORDER — OXYCODONE-ACETAMINOPHEN 5-325 MG PO TABS: 1.0000 | ORAL_TABLET | Freq: Three times a day (TID) | ORAL | 0 refills | Status: DC | PRN

## 2017-07-04 NOTE — Progress Notes (Signed)
Subjective:    Patient ID: Misty Bailey, female   DOB: 46 y.o.   MRN: [id number]   HPI patient states doing real well with left foot but states she still has some throbbing in hurting if she's on it a lot    ROS      Objective:  Physical Exam neurovascular status intact negative Homans sign was noted with well-healed surgical site first metatarsal left with wound edges well coapted and no drainage with good range of motion with approximate 2530 dorsiflexion 20 plantar flexion with no crepitus within the joint noted. Patient does still have moderate discomfort but gradually improving     Assessment:   Doing well post osteotomy left      Plan:    X-ray reviewed and patient was given instructions on continued physical therapy immobilization elevation and compression. Reappoint in 3 weeks and I did rewrite for Percocet 04/27/2024 and she will reduce this gradually over the next few days. Reappoint 3 weeks or earlier if needed  X-rays indicate osteotomy is healing excellent with joint open satisfactory position of the first metatarsal with good alignment noted. Convey to patient

## 2017-07-06 ENCOUNTER — Telehealth: Payer: Self-pay | Admitting: Podiatry

## 2017-07-06 NOTE — Telephone Encounter (Signed)
Left message informing pt she should contact [employer] [phone] concerning paperwork for her leave extension.

## 2017-07-06 NOTE — Telephone Encounter (Signed)
I just saw Dr. Paulla Dolly on Monday for my first post-op and he told me to call back to let him know how I am doing. I am still in a lot of pain and am still taking my percocet and ibuprofen. I am supposed to return to work on the 17 th (didn't specify which month) but I don't think I will be able to. I need to know who my job needs to send paperwork to in case I cannot return on the 17 th. Also, I think I will need a note saying that I need to keep my foot elevated when I do go back to work. If you could please call me back at [phone] and if I do not answer, you can leave a detailed message on the machine. Thank you.

## 2017-07-11 ENCOUNTER — Telehealth: Payer: Self-pay | Admitting: Podiatry

## 2017-07-11 MED ORDER — OXYCODONE-ACETAMINOPHEN 5-325 MG PO TABS
ORAL_TABLET | ORAL | 0 refills | Status: DC
Start: 2017-07-11 — End: 2017-09-14

## 2017-07-11 NOTE — Telephone Encounter (Signed)
I was wanting to know if I can get a refill of my pain medication. I ran out last night.

## 2017-07-11 NOTE — Telephone Encounter (Signed)
Dr. Mellody Drown LOV stated pt will be decreasing the use of the percocet 5/325. Dr. Paulla Dolly standing order to decrease percocet usage #14 one tablet bid prn severe pain. I informed pt and she states she is using a rx for Ibuprofen from another doctor in between the dosing of the percocet.

## 2017-07-24 ENCOUNTER — Ambulatory Visit (INDEPENDENT_AMBULATORY_CARE_PROVIDER_SITE_OTHER): Payer: 59 | Admitting: Podiatry

## 2017-07-24 ENCOUNTER — Encounter: Payer: Self-pay | Admitting: Podiatry

## 2017-07-24 ENCOUNTER — Ambulatory Visit (INDEPENDENT_AMBULATORY_CARE_PROVIDER_SITE_OTHER): Payer: 59

## 2017-07-24 VITALS — BP 126/79 | HR 69 | Temp 97.3°F

## 2017-07-24 DIAGNOSIS — M2022 Hallux rigidus, left foot: Secondary | ICD-10-CM

## 2017-07-24 MED ORDER — OXYCODONE-ACETAMINOPHEN 5-325 MG PO TABS: 1.0000 | ORAL_TABLET | Freq: Three times a day (TID) | ORAL | 0 refills | Status: DC | PRN

## 2017-07-25 NOTE — Progress Notes (Signed)
Subjective:    Patient ID: Misty Bailey, female   DOB: 46 y.o.   MRN: [id number]   HPI patient states she's doing well overall but she still gets some pain in the foot if she's been on it a lot and she still wears her boot most the time    ROS      Objective:  Physical Exam neurovascular status negative Homans sign was noted. There is some duskiness to the left foot but when questioned patient states that's fairly normal appearance that she has. Patient has good pulses have good digital perfusion and the wound edges are well coapted with range of motion showing currently 25 dorsiflexion 20 plantar flexion with no pain no crepitus within the joint but she does have moderate discomfort when palpated     Assessment:   I do believe patient is healing fine but she does have some duskiness to the foot and I want her to begin more aggressive mobilization exercises      Plan:    H&P condition reviewed and discussed warm water soaks at this time the aggressive beginning of range of motion exercises and gradual return soft shoe gear. Patient will continue oral anti-inflammatories and will take Vicodin at nighttime and will be seen back in 4 weeks for [place] to recheck  X-rays indicate the osteotomies healing well the pins are in place the joint congruence with good alignment

## 2017-07-30 ENCOUNTER — Emergency Department (HOSPITAL_COMMUNITY): Payer: 59

## 2017-07-30 ENCOUNTER — Emergency Department (HOSPITAL_COMMUNITY)
Admission: EM | Admit: 2017-07-30 | Discharge: 2017-07-30 | Disposition: A | Discharge status: Home / Self Care | Payer: 59 | Attending: Emergency Medicine | Admitting: Emergency Medicine

## 2017-07-30 ENCOUNTER — Encounter (HOSPITAL_COMMUNITY): Payer: Self-pay | Admitting: *Deleted

## 2017-07-30 DIAGNOSIS — Z79899 Other long term (current) drug therapy: Secondary | ICD-10-CM | POA: Diagnosis not present

## 2017-07-30 DIAGNOSIS — Z87891 Personal history of nicotine dependence: Secondary | ICD-10-CM | POA: Insufficient documentation

## 2017-07-30 DIAGNOSIS — R1031 Right lower quadrant pain: Secondary | ICD-10-CM | POA: Diagnosis present

## 2017-07-30 DIAGNOSIS — K529 Noninfective gastroenteritis and colitis, unspecified: Secondary | ICD-10-CM | POA: Diagnosis not present

## 2017-07-30 DIAGNOSIS — K5792 Diverticulitis of intestine, part unspecified, without perforation or abscess without bleeding: Secondary | ICD-10-CM | POA: Diagnosis not present

## 2017-07-30 LAB — URINALYSIS, ROUTINE W REFLEX MICROSCOPIC
Bilirubin Urine: NEGATIVE
Glucose, UA: NEGATIVE mg/dL
Hgb urine dipstick: NEGATIVE
KETONES UR: NEGATIVE mg/dL
Leukocytes, UA: NEGATIVE
Nitrite: NEGATIVE
PROTEIN: NEGATIVE mg/dL
Specific Gravity, Urine: 1.02 (ref 1.005–1.030)
pH: 7 (ref 5.0–8.0)

## 2017-07-30 LAB — I-STAT BETA HCG BLOOD, ED (MC, WL, AP ONLY): I-stat hCG, quantitative: <5 m[IU]/mL (ref <5)

## 2017-07-30 LAB — COMPREHENSIVE METABOLIC PANEL
ALK PHOS: 84 U/L (ref 38–126)
ALT: 10 U/L — AB (ref 14–54)
AST: 14 U/L — ABNORMAL LOW (ref 15–41)
Albumin: 3.6 g/dL (ref 3.5–5.0)
Anion gap: 7 (ref 5–15)
BUN: 6 mg/dL (ref 6–20)
CALCIUM: 9.1 mg/dL (ref 8.9–10.3)
CO2: 24 mmol/L (ref 22–32)
CREATININE: 1.07 mg/dL — AB (ref 0.44–1.00)
Chloride: 108 mmol/L (ref 101–111)
GFR calc non Af Amer: >60 mL/min (ref >60)
GLUCOSE: 114 mg/dL — AB (ref 65–99)
Potassium: 4.2 mmol/L (ref 3.5–5.1)
SODIUM: 139 mmol/L (ref 135–145)
Total Bilirubin: 0.5 mg/dL (ref 0.3–1.2)
Total Protein: 7.3 g/dL (ref 6.5–8.1)

## 2017-07-30 LAB — LIPASE, BLOOD: Lipase: 21 U/L (ref 11–51)

## 2017-07-30 LAB — WET PREP, GENITAL
Sperm: NONE SEEN
TRICH WET PREP: NONE SEEN
Yeast Wet Prep HPF POC: NONE SEEN

## 2017-07-30 LAB — I-STAT CG4 LACTIC ACID, ED: LACTIC ACID, VENOUS: 0.97 mmol/L (ref 0.5–1.9)

## 2017-07-30 LAB — CBC
HCT: 39 % (ref 36.0–46.0)
Hemoglobin: 12.6 g/dL (ref 12.0–15.0)
MCH: 28.6 pg (ref 26.0–34.0)
MCHC: 32.3 g/dL (ref 30.0–36.0)
MCV: 88.4 fL (ref 78.0–100.0)
PLATELETS: 400 10*3/uL (ref 150–400)
RBC: 4.41 MIL/uL (ref 3.87–5.11)
RDW: 13 % (ref 11.5–15.5)
WBC: 6.3 10*3/uL (ref 4.0–10.5)

## 2017-07-30 MED ORDER — ONDANSETRON HCL 4 MG/2ML IJ SOLN
4.0000 mg | Freq: Once | INTRAMUSCULAR | Status: AC
  Administered 2017-07-30: 4 mg via INTRAVENOUS
  Filled 2017-07-30: qty 2

## 2017-07-30 MED ORDER — CIPROFLOXACIN HCL 500 MG PO TABS: 500.0000 mg | ORAL_TABLET | Freq: Two times a day (BID) | ORAL | 0 refills | Status: AC

## 2017-07-30 MED ORDER — SODIUM CHLORIDE 0.9 % IV BOLUS (SEPSIS)
1000.0000 mL | Freq: Once | INTRAVENOUS | Status: AC
  Administered 2017-07-30: 1000 mL via INTRAVENOUS

## 2017-07-30 MED ORDER — METRONIDAZOLE 500 MG PO TABS: 500.0000 mg | ORAL_TABLET | Freq: Three times a day (TID) | ORAL | 0 refills | Status: DC

## 2017-07-30 MED ORDER — MORPHINE SULFATE (PF) 4 MG/ML IV SOLN
4.0000 mg | Freq: Once | INTRAVENOUS | Status: AC
  Administered 2017-07-30: 4 mg via INTRAVENOUS
  Filled 2017-07-30: qty 1

## 2017-07-30 MED ORDER — ONDANSETRON HCL 4 MG PO TABS: 4.0000 mg | ORAL_TABLET | Freq: Three times a day (TID) | ORAL | 0 refills | Status: DC | PRN

## 2017-07-30 MED ORDER — IOPAMIDOL (ISOVUE-300) INJECTION 61%
INTRAVENOUS | Status: AC
  Administered 2017-07-30: 100 mL via INTRAVENOUS
  Filled 2017-07-30: qty 100

## 2017-07-30 MED ORDER — METRONIDAZOLE 500 MG PO TABS: 500.0000 mg | ORAL_TABLET | Freq: Three times a day (TID) | ORAL | 0 refills | Status: AC

## 2017-07-30 MED ORDER — CIPROFLOXACIN HCL 500 MG PO TABS: 500.0000 mg | ORAL_TABLET | Freq: Two times a day (BID) | ORAL | 0 refills | Status: DC

## 2017-07-30 NOTE — ED Provider Notes (Signed)
I saw and evaluated the patient, reviewed the resident's note and I agree with the findings and plan.  Pertinent History: pain in the lower abdomen with diarrhea - she has had some whitish clay like stools.  No f/c and no urinary sx.    Pertinent Exam findings: mild tachycardia - equal bil lower abd ttp.  No RUQ ttp at all.  Mild guarding R > LLQ.  No CVA ttp, lungs clear.  No distress overall  Results for orders placed or performed during the hospital encounter of 07/30/17  Lipase, blood  Result Value Ref Range   Lipase 21 11 - 51 U/L  Comprehensive metabolic panel  Result Value Ref Range   Sodium 139 135 - 145 mmol/L   Potassium 4.2 3.5 - 5.1 mmol/L   Chloride 108 101 - 111 mmol/L   CO2 24 22 - 32 mmol/L   Glucose, Bld 114 (H) 65 - 99 mg/dL   BUN 6 6 - 20 mg/dL   Creatinine, Ser 1.07 (H) 0.44 - 1.00 mg/dL   Calcium 9.1 8.9 - 10.3 mg/dL   Total Protein 7.3 6.5 - 8.1 g/dL   Albumin 3.6 3.5 - 5.0 g/dL   AST 14 (L) 15 - 41 U/L   ALT 10 (L) 14 - 54 U/L   Alkaline Phosphatase 84 38 - 126 U/L   Total Bilirubin 0.5 0.3 - 1.2 mg/dL   GFR calc non Af Amer >60 >60 mL/min   GFR calc Af Amer >60 >60 mL/min   Anion gap 7 5 - 15  CBC  Result Value Ref Range   WBC 6.3 4.0 - 10.5 K/uL   RBC 4.41 3.87 - 5.11 MIL/uL   Hemoglobin 12.6 12.0 - 15.0 g/dL   HCT 39.0 36.0 - 46.0 %   MCV 88.4 78.0 - 100.0 fL   MCH 28.6 26.0 - 34.0 pg   MCHC 32.3 30.0 - 36.0 g/dL   RDW 13.0 11.5 - 15.5 %   Platelets 400 150 - 400 K/uL     Pelvic, CT, labs, UA, fluids and pain control    Final diagnoses:  Diverticulitis  Colitis      Noemi Chapel, MD 08/02/17 [phone]

## 2017-07-30 NOTE — ED Provider Notes (Signed)
[facility] DEPT Provider Note   CSN: [id number] Arrival date & time: 07/30/17  1302     History   Chief Complaint Chief Complaint  Patient presents with  . Abdominal Pain  . Diarrhea     HPI Misty Bailey is a 46 y.o. Female. History of arthritis and depression who presents with 2 weeks of intermittent right lower quadrant abdominal pain and diarrhea. She reports pain is described as dull, worse with diarrhea and passing gas. She has had up to 2-3 episodes of loose stools daily, the last 2 days she describes white colored loose stools. She denies any fevers or chills, nausea or vomiting, urinary complaints, or vaginal discharge. She denies any personal or family history of inflammatory bowel disease. She denies previous abdominal surgeries. She has not been seen by her primary care doctor for these symptoms.   Past Medical History:  Diagnosis Date  . Anemia   . Arthritis 02/2014   Rib Cage  . Depression   . Panic attacks   . Suppurative hidradenitis     Patient Active Problem List   Diagnosis Date Noted  . Costochondritis 03/26/2014  . Herpes 03/21/2014  . Suppurative hidradenitis   . Depression   . Pap smear for cervical cancer screening 08/09/2013  . PE (physical exam), annual 08/09/2013  . Vaginal discharge 08/09/2013    Past Surgical History:  Procedure Laterality Date  . PELVIC LAPAROSCOPY  1998/1999 `   pelvic adhesions and pain    OB History    Gravida Para Term Preterm AB Living   2 1   1 1  0   SAB TAB Ectopic Multiple Live Births   1               Home Medications    Prior to Admission medications   Medication Sig Start Date End Date Taking? Authorizing Provider  albuterol (PROVENTIL HFA;VENTOLIN HFA) 108 (90 Base) MCG/ACT inhaler Inhale 2 puffs into the lungs every 6 (six) hours as needed for wheezing or shortness of breath. 06/09/17   Ladell Pier, MD  ciprofloxacin (CIPRO) 500 MG tablet Take 1 tablet (500 mg total) by mouth 2 (two)  times daily. 07/30/17 08/06/17  Arnetha Massy, MD  fluticasone (FLONASE) 50 MCG/ACT nasal spray Place 2 sprays into both nostrils daily. 06/09/17   Ladell Pier, MD  ibuprofen (ADVIL,MOTRIN) 800 MG tablet Take 1 tablet (800 mg total) by mouth every 8 (eight) hours as needed for moderate pain. 06/09/17   Ladell Pier, MD  metroNIDAZOLE (FLAGYL) 500 MG tablet Take 1 tablet (500 mg total) by mouth 3 (three) times daily. 07/30/17 08/06/17  Arnetha Massy, MD  ondansetron (ZOFRAN) 4 MG tablet Take 4 mg by mouth every 8 (eight) hours as needed for nausea or vomiting (Take 1 tablet by mouth every eight hours as needed for nausea). 06/27/17   Wallene Huh, DPM  ondansetron (ZOFRAN) 4 MG tablet Take 1 tablet (4 mg total) by mouth every 8 (eight) hours as needed for nausea or vomiting. 07/30/17   Arnetha Massy, MD  oxyCODONE-acetaminophen (PERCOCET/ROXICET) 5-325 MG tablet Take by mouth every 4 (four) hours as needed for severe pain.    [provider]  oxyCODONE-acetaminophen (ROXICET) 5-325 MG tablet Take 1 tablet by mouth every 8 (eight) hours as needed for severe pain. 07/03/17   Wallene Huh, DPM  oxyCODONE-acetaminophen (ROXICET) 5-325 MG tablet Take one tablet every 12 hours prn severe pain. 07/11/17   Wallene Huh, DPM  oxyCODONE-acetaminophen (ROXICET) 5-325 MG tablet Take 1 tablet by mouth every 8 (eight) hours as needed for severe pain. 07/24/17   Wallene Huh, DPM  oxyCODONE-acetaminophen (ROXICET) 5-325 MG tablet Take 1 tablet by mouth every 8 (eight) hours as needed for severe pain. 07/24/17   Wallene Huh, DPM    Family History Family History  Problem Relation Age of Onset  . Diabetes Mother   . Hypertension Mother   . Hyperlipidemia Mother   . Heart murmur Mother   . Hypertension Father   . Stroke Father   . Hyperlipidemia Father   . Hypertension Maternal Grandmother   . Diabetes Maternal Grandmother   . Osteoporosis Maternal Grandmother   . Thyroid disease  Paternal Aunt     Social History Social History  Substance Use Topics  . Smoking status: Former Research scientist (life sciences)  . Smokeless tobacco: Current User     Comment: Weed  . Alcohol use No     Comment: Occassionally      Allergies   Codeine   Review of Systems Review of Systems  Constitutional: Negative for chills and fever.       Night sweats  HENT: Negative for ear pain and sore throat.   Eyes: Negative for pain and visual disturbance.  Respiratory: Negative for cough and shortness of breath.   Cardiovascular: Negative for chest pain and palpitations.  Gastrointestinal: Positive for abdominal pain, diarrhea and nausea. Negative for vomiting.  Genitourinary: Negative for dysuria and hematuria.  Musculoskeletal: Negative for arthralgias and back pain.  Skin: Negative for color change and rash.  Neurological: Negative for seizures and syncope.  All other systems reviewed and are negative.    Physical Exam Updated Vital Signs BP 138/75   Pulse (!) 54   Temp 98.8 F (37.1 C) (Oral)   Resp 16   SpO2 100%   Physical Exam  Constitutional: She appears well-developed and well-nourished. No distress.  HENT:  Head: Normocephalic and atraumatic.  Eyes: Conjunctivae are normal.  Neck: Neck supple.  Cardiovascular: Normal rate and regular rhythm.   No murmur heard. Pulmonary/Chest: Effort normal and breath sounds normal. No respiratory distress.  Abdominal: Soft. She exhibits no distension. There is tenderness (bilateral lower abdominal ttp). There is no rebound and no guarding.  Genitourinary: Cervix exhibits no motion tenderness. Right adnexum displays tenderness (mild right adnexal ttp). Right adnexum displays no mass and no fullness. Left adnexum displays no mass, no tenderness and no fullness. Vaginal discharge (minimal amount of white discharge) found.  Musculoskeletal: She exhibits no edema.  Neurological: She is alert.  Skin: Skin is warm and dry.  Psychiatric: She has a normal  mood and affect.  Nursing note and vitals reviewed.    ED Treatments / Results  Labs (all labs ordered are listed, but only abnormal results are displayed) Labs Reviewed  WET PREP, GENITAL - Abnormal; Notable for the following:       Result Value   Clue Cells Wet Prep HPF POC PRESENT (*)    WBC, Wet Prep HPF POC MANY (*)    All other components within normal limits  COMPREHENSIVE METABOLIC PANEL - Abnormal; Notable for the following:    Glucose, Bld 114 (*)    Creatinine, Ser 1.07 (*)    AST 14 (*)    ALT 10 (*)    All other components within normal limits  LIPASE, BLOOD  CBC  URINALYSIS, ROUTINE W REFLEX MICROSCOPIC  I-STAT BETA HCG BLOOD, ED ([facility], [facility], [facility] ONLY)  I-STAT CG4 LACTIC ACID, ED  GC/CHLAMYDIA PROBE AMP ([facility]) NOT AT [facility]    EKG  EKG Interpretation None       Radiology Ct Abdomen Pelvis W Contrast  Result Date: 07/30/2017 CLINICAL DATA:  46 year old female with right lower quadrant and suprapubic pain for 2 weeks. Diarrhea for 2 days. EXAM: CT ABDOMEN AND PELVIS WITH CONTRAST TECHNIQUE: Multidetector CT imaging of the abdomen and pelvis was performed using the standard protocol following bolus administration of intravenous contrast. CONTRAST:  167mL ISOVUE-300 IOPAMIDOL (ISOVUE-300) INJECTION 61% COMPARISON:  CT Abdomen and Pelvis 04/10/2005. FINDINGS: Lower chest: Visible lung bases are normal aside from mild dependent atelectasis. Stable cardiac size. No pericardial or pleural effusion. Hepatobiliary: Dependent Gallbladder sludge and/or cholelithiasis. No pericholecystic inflammation. 2 small hypodense lesions in the liver dome, the larger is 10 mm on series 3, image 3. These were not evident in 2006. No other discrete liver lesion. Pancreas: Negative. Spleen: Negative. Adrenals/Urinary Tract: Normal adrenal glands. Left lower pole 4 mm nephrolithiasis. Bilateral renal enhancement and contrast excretion is within normal limits. No other urologic calculus  identified. No left hydroureter. Diminutive and unremarkable urinary bladder. Stomach/Bowel: Decompressed rectum and distal sigmoid. Thickened and indistinct appearance of the proximal to mid sigmoid colon along a segment of about 4.5 cm. There are diverticula in the region. The lumen is distorted. Wall thickening is at least 12 mm. There is surrounding mesenteric stranding. See series 3, image 63 and coronal image 46. There is an increase conspicuity of small lymph nodes in the regional mesentery 8 (coronal image 43). Upstream of this finding mild to moderate diverticulosis continues into the distal left colon with no active inflammation. Splenic flexure, transverse colon, right colon, and appendix appear normal. Negative terminal ileum. No dilated small bowel. Stoma stomach and duodenum appear within normal limits. No abdominal free fluid or free air identified. Vascular/Lymphatic: There is mild soft plaque in the distal abdominal aorta. Major arterial structures in the abdomen and pelvis are patent. Portal venous system appears patent. New line no abnormally enlarged lymph nodes identified in the abdomen or pelvis. Small nodes in the sigmoid mesentery CED as stated above. Reproductive: Retroverted uterus. 2.6 cm low-density rim enhancing structure at the left adnexa is likely a physiologic cyst. There is a small volume of adjacent free fluid with mildly complex density. Other: Small volume of free fluid in the cul-de-sac and adjacent to the left adnexa. Musculoskeletal: No acute osseous abnormality identified. The pubic symphysis appears normal. IMPRESSION: 1. Recommend follow-up Colonoscopy: abnormal 4.5 cm segment of the sigmoid colon which may be due to an acute diverticulitis, but has a morphology such that a Colonic Adenocarcinoma is difficult to exclude. 2. Small volume of mildly complex appearing pelvic free fluid could be reactive due to #1 or physiologic (due to a left ovarian cyst). There is no abscess  or free air. 3. Small hypodense areas at the superior right hepatic lobe are new since 2006, and indeterminate in the setting of #1. If colon cancer is excluded by colonoscopy then these would most likely be benign hepatic cysts or hemangiomas. 4. Gallbladder sludge and/or stones. 5. Left nephrolithiasis. 6. Mild atherosclerosis of the distal abdominal aorta. 7. Salient findings on this exam discussed by telephone with Dr. Arnetha Massy on 07/30/2017 at 17:42 . Electronically Signed   By: Genevie Ann M.D.   On: 07/30/2017 17:45    Procedures Procedures (including critical care time)  Medications Ordered in ED Medications  sodium chloride 0.9 %  bolus 1,000 mL (1,000 mLs Intravenous New Bag/Given 07/30/17 1626)  morphine 4 MG/ML injection 4 mg (4 mg Intravenous Given 07/30/17 1626)  ondansetron (ZOFRAN) injection 4 mg (4 mg Intravenous Given 07/30/17 1626)  iopamidol (ISOVUE-300) 61 % injection (100 mLs Intravenous Contrast Given 07/30/17 1653)     Initial Impression / Assessment and Plan / ED Course  I have reviewed the triage vital signs and the nursing notes.  Pertinent labs & imaging results that were available during my care of the patient were reviewed by me and considered in my medical decision making (see chart for details).     Patient is a 46 year old female previously healthy presents with 2 weeks of lower abdominal pain and diarrhea. Patient arrived tachycardiac, otherwise hemodynamically stable, and no acute distress. Exam as above, significant for lower abdominal tenderness to palpation. No right upper quadrant tenderness.   Patient treated with IV fluids and pain control, with improvement in tachycardia. Labs significant for no leukocytosis or elevated lactate. CT abdomen and pelvis Significant for abnormal 4.5 cm segment of sigmoid colon which may be due to acute diverticulitis but cannot rule out colonic adenocarcinoma. Also noted small hypodense areas in the right hepatic lobe, could be  benign but would be more concerning if sigmoid mass is indeed colonic cancer. Additional findings include gallbladder sludge and/or stones, left nephrolithiasis.  Patient still able to tolerate po, and is hemodynamically stable and safe for outpatient management of diverticulitis. I discussed with patient at length bedside her CT findings of diverticulitis and possible colonic mass. I instructed her that she will need close follow-up once her diverticulitis is improved for colonoscopy to rule out colon cancer. I have provided her with gastroenterology contact information. I also advised one week follow-up with her primary care physician for abdominal check. Antibiotics and antiemetics prescribed. Return precautions discussed. Patient agreement with plan at time of discharge.  Patient and plan of care discussed with Attending physician, Dr. Sabra Heck.    Final Clinical Impressions(s) / ED Diagnoses   Final diagnoses:  Diverticulitis  Colitis    New Prescriptions New Prescriptions   CIPROFLOXACIN (CIPRO) 500 MG TABLET    Take 1 tablet (500 mg total) by mouth 2 (two) times daily.   METRONIDAZOLE (FLAGYL) 500 MG TABLET    Take 1 tablet (500 mg total) by mouth 3 (three) times daily.   ONDANSETRON (ZOFRAN) 4 MG TABLET    Take 1 tablet (4 mg total) by mouth every 8 (eight) hours as needed for nausea or vomiting.     Arnetha Massy, MD 07/30/17 Mariane Masters, MD 08/02/17 [phone]

## 2017-07-30 NOTE — ED Triage Notes (Signed)
Pt reports having right side lower abd pain with nausea, diarrhea and mucous in her stools x 2 weeks. Denies vomiting.

## 2017-07-30 NOTE — Discharge Instructions (Signed)
Your CT exam showed diverticulitis/colitis. Please take antibiotics as instructed. Once antibiotics are completed and your symptoms are improved, you will need a colonoscopy due to concern for possible mass in your sigmoid colon. This may be due to diverticulitis, but you will need a colonscopy to confirm this is not a mass.

## 2017-07-31 ENCOUNTER — Encounter: Payer: Self-pay | Admitting: Podiatry

## 2017-07-31 LAB — GC/CHLAMYDIA PROBE AMP (~~LOC~~) NOT AT ARMC
Chlamydia: NEGATIVE
NEISSERIA GONORRHEA: NEGATIVE

## 2017-08-01 ENCOUNTER — Telehealth: Payer: Self-pay | Admitting: Podiatry

## 2017-08-01 NOTE — Telephone Encounter (Signed)
I am calling to let Dr. Paulla Dolly know that I tried to make it through work today but I'm in so much pain I'm having to leave to go home and take one of my prescribed pain pills. I wanted to let him know that my employer will be faxing over paperwork to extend me out of work. I'm scheduled for a post-operative appointment on 14 August 2017 and as of right now, I will put that down as my return to work date. Please let Dr. Paulla Dolly know I tried really hard.

## 2017-08-14 ENCOUNTER — Ambulatory Visit (INDEPENDENT_AMBULATORY_CARE_PROVIDER_SITE_OTHER): Payer: 59 | Admitting: Podiatry

## 2017-08-14 ENCOUNTER — Ambulatory Visit (INDEPENDENT_AMBULATORY_CARE_PROVIDER_SITE_OTHER): Payer: 59

## 2017-08-14 DIAGNOSIS — M2022 Hallux rigidus, left foot: Secondary | ICD-10-CM | POA: Diagnosis not present

## 2017-08-14 NOTE — Progress Notes (Signed)
Subjective:    Patient ID: Misty Bailey, female   DOB: 46 y.o.   MRN: [id number]   HPI patient states she still has quite a bit of discomfort in the joint if she walks a lot and while it's improving it still present    ROS      Objective:  Physical Exam neurovascular status intact with negative Homans sign noted well coapted incision site right first metatarsal with skin that's and much better color at this time. The wound edges are coapted well and range of motion is moderately restricted with approximate 1520 dorsiflexion 15 of plantarflexion with no pain no crepitus within the joint surface.     Assessment:    Doing well but she's not working the joint as much as I was expecting     Plan:     H&P and x-ray reviewed with patient. I went ahead today and I advised this patient on range of motion exercises and the importance of this and showed her how to do them and to begin wearing soft shoe gear. Patient will be seen back for [place] to recheck in 5 weeks or earlier if needed  X-rays indicate the osteotomy is healing well pins are in place no signs of movement

## 2017-08-15 ENCOUNTER — Encounter: Payer: Self-pay | Admitting: Gastroenterology

## 2017-08-16 ENCOUNTER — Encounter: Payer: Self-pay | Admitting: Podiatry

## 2017-09-14 ENCOUNTER — Encounter: Payer: Self-pay | Admitting: Gastroenterology

## 2017-09-14 ENCOUNTER — Ambulatory Visit (INDEPENDENT_AMBULATORY_CARE_PROVIDER_SITE_OTHER): Payer: 59 | Admitting: Gastroenterology

## 2017-09-14 ENCOUNTER — Encounter (INDEPENDENT_AMBULATORY_CARE_PROVIDER_SITE_OTHER): Payer: Self-pay

## 2017-09-14 VITALS — BP 130/80 | HR 76 | Ht 61.75 in | Wt 130.0 lb

## 2017-09-14 DIAGNOSIS — K5732 Diverticulitis of large intestine without perforation or abscess without bleeding: Secondary | ICD-10-CM

## 2017-09-14 DIAGNOSIS — K8020 Calculus of gallbladder without cholecystitis without obstruction: Secondary | ICD-10-CM | POA: Diagnosis not present

## 2017-09-14 DIAGNOSIS — R1031 Right lower quadrant pain: Secondary | ICD-10-CM

## 2017-09-14 MED ORDER — CIPROFLOXACIN HCL 500 MG PO TABS: 500.0000 mg | ORAL_TABLET | Freq: Two times a day (BID) | ORAL | 0 refills | Status: DC

## 2017-09-14 MED ORDER — NA SULFATE-K SULFATE-MG SULF 17.5-3.13-1.6 GM/177ML PO SOLN: 1.0000 | Freq: Once | ORAL | 0 refills | Status: AC

## 2017-09-14 MED ORDER — METRONIDAZOLE 500 MG PO TABS: 500.0000 mg | ORAL_TABLET | Freq: Three times a day (TID) | ORAL | 0 refills | Status: DC

## 2017-09-14 NOTE — Progress Notes (Signed)
[facility] Consult Note:  History: Misty Bailey 09/14/2017  Referring physician: Ladell Pier, MD  Reason for consult/chief complaint: Diverticulitis (post ED; pt states she is feeling better, pain has improved)   Subjective  HPI:  This is a 46 year old woman referred by primary care after recent ED visit for lower abdominal pain and diverticulitis. She was seen in the ED in early August, and had had 2 weeks of bandlike lower abdominal pain that was slowly worsening. She had developed intermittent constipation and diarrhea during that time, but had no rectal bleeding. CT scan showed marked sigmoid inflammation with a rounded intraluminal density of uncertain significance. She was prescribed ciprofloxacin and Flagyl, but was only able to take the Flagyl. For some reason, her pharmacy would not dispense the ciprofloxacin out of concern that it would interact with her pain medication. She had been on Percocet since a recent foot surgery. She has steadily improved since then, but still has some intermittent bandlike pain with no clear triggers or relieving factors. Her bowel habits seem to have normalized and there is still no bleeding. She denies chronic upper digestive symptoms.  ROS:  Review of Systems  Constitutional: Negative for appetite change and unexpected weight change.  HENT: Negative for mouth sores and voice change.   Eyes: Negative for pain and redness.  Respiratory: Negative for cough and shortness of breath.   Cardiovascular: Positive for chest pain. Negative for palpitations.  Genitourinary: Negative for dysuria and hematuria.  Musculoskeletal: Negative for arthralgias and myalgias.       Foot pain after recent surgery   Skin: Negative for pallor and rash.  Neurological: Negative for weakness and headaches.  Hematological: Negative for adenopathy.   Arthritis chest pain (reports prior workup)  Past Medical History: Past Medical  History:  Diagnosis Date  . Anemia   . Arthritis 02/2014   Rib Cage  . Depression   . Panic attacks   . Suppurative hidradenitis      Past Surgical History: Past Surgical History:  Procedure Laterality Date  . FOOT SURGERY    . PELVIC LAPAROSCOPY  1998/1999 `   pelvic adhesions and pain     Family History: Family History  Problem Relation Age of Onset  . Diabetes Mother   . Hypertension Mother   . Hyperlipidemia Mother   . Heart murmur Mother   . Hypertension Father   . Stroke Father   . Hyperlipidemia Father   . Hypertension Maternal Grandmother   . Diabetes Maternal Grandmother   . Osteoporosis Maternal Grandmother   . Thyroid disease Paternal Aunt     Social History: Social History   Social History  . Marital status: Single    Spouse name: N/A  . Number of children: N/A  . Years of education: N/A   Social History Main Topics  . Smoking status: Former Research scientist (life sciences)  . Smokeless tobacco: Current User     Comment: Weed  . Alcohol use No     Comment: Occassionally   . Drug use: No  . Sexual activity: Not Currently    Partners: Male    Birth control/ protection: None   Other Topics Concern  . None   Social History Narrative   Boyfriend incarcerated 2014-2018    Allergies: Allergies  Allergen Reactions  . Codeine Shortness Of Breath    Outpatient Meds: Current Outpatient Prescriptions  Medication Sig Dispense Refill  . albuterol (PROVENTIL HFA;VENTOLIN HFA) 108 (90 Base) MCG/ACT inhaler Inhale 2 puffs into  the lungs every 6 (six) hours as needed for wheezing or shortness of breath. 1 Inhaler 4  . fluticasone (FLONASE) 50 MCG/ACT nasal spray Place 2 sprays into both nostrils daily. 16 g 6  . ibuprofen (ADVIL,MOTRIN) 800 MG tablet Take 1 tablet (800 mg total) by mouth every 8 (eight) hours as needed for moderate pain. 30 tablet 1  . ondansetron (ZOFRAN) 4 MG tablet Take 1 tablet (4 mg total) by mouth every 8 (eight) hours as needed for nausea or vomiting.  10 tablet 0  . oxyCODONE-acetaminophen (PERCOCET/ROXICET) 5-325 MG tablet Take by mouth every 4 (four) hours as needed for severe pain.    . ciprofloxacin (CIPRO) 500 MG tablet Take 1 tablet (500 mg total) by mouth 2 (two) times daily. 14 tablet 0  . metroNIDAZOLE (FLAGYL) 500 MG tablet Take 1 tablet (500 mg total) by mouth 3 (three) times daily. 21 tablet 0  . Na Sulfate-K Sulfate-Mg Sulf 17.5-3.13-1.6 GM/180ML SOLN Take 1 kit by mouth once. 354 mL 0   No current facility-administered medications for this visit.       ___________________________________________________________________ Objective   Exam:  BP 130/80   Pulse 76   Ht 5' 1.75" (1.568 m)   Wt 130 lb (59 kg)   BMI 23.97 kg/m    General: this is a(n) Well-appearing woman   Eyes: sclera anicteric, no redness  ENT: oral mucosa moist without lesions, no cervical or supraclavicular lymphadenopathy, good dentition  CV: RRR without murmur, S1/S2, no JVD, no peripheral edema. Left lower leg in an orthopedic boot  Resp: clear to auscultation bilaterally, normal RR and effort noted  GI: soft, moderate RLQ tenderness, with active bowel sounds. No hepatosplenomegaly noted. No palpable mass  Skin; warm and dry, no rash or jaundice noted  Neuro: awake, alert and oriented x 3. Normal gross motor function and fluent speech  Labs:  CBC Latest Ref Rng & Units 07/30/2017 08/09/2013 02/24/2012  WBC 4.0 - 10.5 K/uL 6.3 5.2 -  Hemoglobin 12.0 - 15.0 g/dL 12.6 12.7 11.9(L)  Hematocrit 36.0 - 46.0 % 39.0 39.5 35.0(L)  Platelets 150 - 400 K/uL 400 300 -   CMP Latest Ref Rng & Units 07/30/2017 03/20/2014 08/09/2013  Glucose 65 - 99 mg/dL 114(H) 83 90  BUN 6 - 20 mg/dL _0 Creatinine 0.44 - 1.00 mg/dL 1.07(H) 0.86 0.99  Sodium 135 - 145 mmol/L 139 140 137  Potassium 3.5 - 5.1 mmol/L 4.2 4.3 4.0  Chloride 101 - 111 mmol/L 108 108 104  CO2 22 - 32 mmol/L _1 Calcium 8.9 - 10.3 mg/dL 9.1 8.8 9.3  Total Protein 6.5 - 8.1 g/dL  7.3 6.3 6.5  Total Bilirubin 0.3 - 1.2 mg/dL 0.5 0.5 0.7  Alkaline Phos 38 - 126 U/L 84 61 67  AST 15 - 41 U/L 14(L) 13 12  ALT 14 - 54 U/L 10(L) 10 9     Radiologic Studies:  CTAP (personally reviewed): 07/30/17  Probable GB sludge or stones - definite dependent density Sig inflammation, rounded 4-5 cm density within it. Pelvic free fluid   Assessment: Encounter Diagnoses  Name Primary?  . Diverticulitis of colon Yes  . RLQ abdominal pain   . Gallstones     The gallstones appear to be asymptomatic. I described typical biliary colic so she can alert [place] if that develops. I wonder if she still may have some smoldering diverticulitis, even of the tenderness is on the right side. This was apparently  the case even during her ED visit. After personally reviewing the CT scan, I explained the uncertain significance of the rounded abnormality within the area of sigmoid inflammation. It is probably retained stool and/or inflammation, but given the persistent symptoms, I felt she should have a colonoscopy to rule out neoplasia. She is agreeable, the procedure is scheduled after discussion of risks and benefits.  The benefits and risks of the planned procedure were described in detail with the patient or (when appropriate) their health care proxy.  Risks were outlined as including, but not limited to, bleeding, infection, perforation, adverse medication reaction leading to cardiac or pulmonary decompensation, or pancreatitis (if ERCP).  The limitation of incomplete mucosal visualization was also discussed.  No guarantees or warranties were given.  I prescribed 7 more days of ciprofloxacin and metronidazole as well.  Thank you for the courtesy of this consult.  Please call me with any questions or concerns.  Nelida Meuse III  CC: Ladell Pier, MD

## 2017-09-14 NOTE — Patient Instructions (Signed)
If you are age 48 or older, your body mass index should be between 23-30. Your Body mass index is 23.97 kg/m. If this is out of the aforementioned range listed, please consider follow up with your Primary Care Provider.  If you are age 49 or younger, your body mass index should be between 19-25. Your Body mass index is 23.97 kg/m. If this is out of the aformentioned range listed, please consider follow up with your Primary Care Provider.   You have been scheduled for a colonoscopy. Please follow written instructions given to you at your visit today.  Please pick up your prep supplies at the pharmacy within the next 1-3 days. If you use inhalers (even only as needed), please bring them with you on the day of your procedure. Your physician has requested that you go to www.startemmi.com and enter the access code given to you at your visit today. This web site gives a general overview about your procedure. However, you should still follow specific instructions given to you by our office regarding your preparation for the procedure.  Thank you for choosing [facility]  Dr Wilfrid Lund III

## 2017-09-18 ENCOUNTER — Encounter: Payer: Self-pay | Admitting: Podiatry

## 2017-09-18 ENCOUNTER — Ambulatory Visit (INDEPENDENT_AMBULATORY_CARE_PROVIDER_SITE_OTHER): Payer: Self-pay | Admitting: Podiatry

## 2017-09-18 ENCOUNTER — Ambulatory Visit (INDEPENDENT_AMBULATORY_CARE_PROVIDER_SITE_OTHER): Payer: 59

## 2017-09-18 DIAGNOSIS — M2022 Hallux rigidus, left foot: Secondary | ICD-10-CM

## 2017-09-19 ENCOUNTER — Telehealth: Payer: Self-pay | Admitting: Gastroenterology

## 2017-09-19 NOTE — Progress Notes (Signed)
Subjective:    Patient ID: Misty Bailey, female   DOB: 46 y.o.   MRN: [id number]   HPI patient states she's doing better with her foot but she still not wearing shoes as we had instructed her    ROS      Objective:  Physical Exam neurovascular status intact negative Homans sign was noted with patient's left first MPJ improving as far as motion goes with approximate 25 of dorsiflexion 20 plantarflexion no crepitus within the joint with mild edema and moderate discomfort in the plantar aspect first metatarsal but no increased edema     Assessment:    Patient has been slow to get back into regular shoes that motion is improving with no crepitus     Plan:    Reviewed x-rays and instructed on the importance of return to shoe gear and I want her to take oral anti-inflammatories to help reduce any inflammation. I'm encouraging her to continue to do this and she promises me she will and she'll be seen back to recheck in 6 weeks or earlier if needed  X-rays indicate the osteotomy is healing well pins are in place joint congruence with no indications of spur formation

## 2017-09-20 ENCOUNTER — Other Ambulatory Visit: Payer: Self-pay

## 2017-09-20 MED ORDER — CIPROFLOXACIN HCL 500 MG PO TABS: 500.0000 mg | ORAL_TABLET | Freq: Two times a day (BID) | ORAL | 0 refills | Status: AC

## 2017-09-20 NOTE — Telephone Encounter (Signed)
Done

## 2017-09-20 NOTE — Telephone Encounter (Signed)
Yes, of course

## 2017-09-20 NOTE — Telephone Encounter (Signed)
OK to resend the cipro and flagy  to a new pharmacy?

## 2017-09-22 ENCOUNTER — Encounter: Payer: Self-pay | Admitting: Gastroenterology

## 2017-10-06 ENCOUNTER — Encounter: Payer: 59 | Admitting: Gastroenterology

## 2017-10-30 ENCOUNTER — Ambulatory Visit (INDEPENDENT_AMBULATORY_CARE_PROVIDER_SITE_OTHER): Payer: 59

## 2017-10-30 ENCOUNTER — Telehealth: Payer: Self-pay | Admitting: Gastroenterology

## 2017-10-30 ENCOUNTER — Encounter: Payer: Self-pay | Admitting: Podiatry

## 2017-10-30 ENCOUNTER — Ambulatory Visit: Payer: 59 | Admitting: Podiatry

## 2017-10-30 DIAGNOSIS — M2022 Hallux rigidus, left foot: Secondary | ICD-10-CM

## 2017-10-30 DIAGNOSIS — L309 Dermatitis, unspecified: Secondary | ICD-10-CM

## 2017-10-30 MED ORDER — TERBINAFINE HCL 250 MG PO TABS: 250.0000 mg | ORAL_TABLET | Freq: Every day | ORAL | 0 refills | Status: DC

## 2017-10-31 ENCOUNTER — Other Ambulatory Visit: Payer: Self-pay

## 2017-10-31 NOTE — Progress Notes (Signed)
Subjective:    Patient ID: Misty Bailey, female   DOB: 46 y.o.   MRN: [id number]   HPI patient states that she seems to be improving but she still has some discomfort in her big toe joint if she does a lot of walking and she's developed a significant irritation between her lesser digits left    ROS      Objective:  Physical Exam neurovascular status negative Homans sign and noted with patient found to have diminishment of issues around the first MPJ left with gradual increase in range of motion with no crepitus of the joint. Discoloration with damage to the lesser digits interspace third fourth fifth left     Assessment:    Doing well post osteotomy left with patient still needing work on range of motion with dermatitis with probable fungal infection left     Plan:   H&P x-ray reviewed discussed fungal infection at this time placed on Lamisil 250 mg daily for 60 days and she did have liver function studies done which were normal. She'll be seen back for me to recheck in 2 weeks or earlier if needed  X-rays indicate the ostomy site looks good joint congruence with no pathology

## 2017-10-31 NOTE — Telephone Encounter (Signed)
New instructions mailed to the pt as requested.

## 2017-11-07 ENCOUNTER — Other Ambulatory Visit (HOSPITAL_COMMUNITY)
Admission: RE | Admit: 2017-11-07 | Discharge: 2017-11-07 | Disposition: A | Discharge status: Home / Self Care | Payer: 59 | Source: Ambulatory Visit | Attending: Internal Medicine | Admitting: Internal Medicine

## 2017-11-07 ENCOUNTER — Encounter: Payer: Self-pay | Admitting: Internal Medicine

## 2017-11-07 ENCOUNTER — Ambulatory Visit: Payer: 59 | Attending: Internal Medicine | Admitting: Internal Medicine

## 2017-11-07 VITALS — BP 123/80 | HR 57 | Temp 98.6°F | Resp 16 | Wt 135.8 lb

## 2017-11-07 DIAGNOSIS — L0292 Furuncle, unspecified: Secondary | ICD-10-CM | POA: Diagnosis not present

## 2017-11-07 DIAGNOSIS — B372 Candidiasis of skin and nail: Secondary | ICD-10-CM | POA: Diagnosis not present

## 2017-11-07 DIAGNOSIS — Z124 Encounter for screening for malignant neoplasm of cervix: Secondary | ICD-10-CM

## 2017-11-07 DIAGNOSIS — L0293 Carbuncle, unspecified: Secondary | ICD-10-CM

## 2017-11-07 DIAGNOSIS — Z23 Encounter for immunization: Secondary | ICD-10-CM | POA: Diagnosis not present

## 2017-11-07 DIAGNOSIS — R935 Abnormal findings on diagnostic imaging of other abdominal regions, including retroperitoneum: Secondary | ICD-10-CM | POA: Diagnosis not present

## 2017-11-07 DIAGNOSIS — Z0000 Encounter for general adult medical examination without abnormal findings: Secondary | ICD-10-CM | POA: Diagnosis not present

## 2017-11-07 DIAGNOSIS — N946 Dysmenorrhea, unspecified: Secondary | ICD-10-CM

## 2017-11-07 DIAGNOSIS — Z01419 Encounter for gynecological examination (general) (routine) without abnormal findings: Secondary | ICD-10-CM | POA: Insufficient documentation

## 2017-11-07 MED ORDER — NYSTATIN 100000 UNIT/GM EX POWD: Freq: Four times a day (QID) | CUTANEOUS | 2 refills | Status: DC

## 2017-11-07 NOTE — Patient Instructions (Addendum)
Continue to use ibuprofen as needed for menstrual cramps.  Try to get in some exercise at least 3-4 days a week for 30 minutes.  Speak with your gastroenterologist about lesion seen on the liver.  Influenza Virus Vaccine injection (Fluarix) What is this medicine? INFLUENZA VIRUS VACCINE (in floo EN zuh VAHY ruhs vak SEEN) helps to reduce the risk of getting influenza also known as the flu. This medicine may be used for other purposes; ask your health care provider or pharmacist if you have questions. COMMON BRAND NAME(S): Fluarix, Fluzone What should I tell my health care provider before I take this medicine? They need to know if you have any of these conditions: -bleeding disorder like hemophilia -fever or infection -Guillain-Barre syndrome or other neurological problems -immune system problems -infection with the human immunodeficiency virus (HIV) or AIDS -low blood platelet counts -multiple sclerosis -an unusual or allergic reaction to influenza virus vaccine, eggs, chicken proteins, latex, gentamicin, other medicines, foods, dyes or preservatives -pregnant or trying to get pregnant -breast-feeding How should I use this medicine? This vaccine is for injection into a muscle. It is given by a health care professional. A copy of Vaccine Information Statements will be given before each vaccination. Read this sheet carefully each time. The sheet may change frequently. Talk to your pediatrician regarding the use of this medicine in children. Special care may be needed. Overdosage: If you think you have taken too much of this medicine contact a poison control center or emergency room at once. NOTE: This medicine is only for you. Do not share this medicine with others. What if I miss a dose? This does not apply. What may interact with this medicine? -chemotherapy or radiation therapy -medicines that lower your immune system like etanercept, anakinra, infliximab, and adalimumab -medicines  that treat or prevent blood clots like warfarin -phenytoin -steroid medicines like prednisone or cortisone -theophylline -vaccines This list may not describe all possible interactions. Give your health care provider a list of all the medicines, herbs, non-prescription drugs, or dietary supplements you use. Also tell them if you smoke, drink alcohol, or use illegal drugs. Some items may interact with your medicine. What should I watch for while using this medicine? Report any side effects that do not go away within 3 days to your doctor or health care professional. Call your health care provider if any unusual symptoms occur within 6 weeks of receiving this vaccine. You may still catch the flu, but the illness is not usually as bad. You cannot get the flu from the vaccine. The vaccine will not protect against colds or other illnesses that may cause fever. The vaccine is needed every year. What side effects may I notice from receiving this medicine? Side effects that you should report to your doctor or health care professional as soon as possible: -allergic reactions like skin rash, itching or hives, swelling of the face, lips, or tongue Side effects that usually do not require medical attention (report to your doctor or health care professional if they continue or are bothersome): -fever -headache -muscle aches and pains -pain, tenderness, redness, or swelling at site where injected -weak or tired This list may not describe all possible side effects. Call your doctor for medical advice about side effects. You may report side effects to FDA at 1-800-FDA-1088. Where should I keep my medicine? This vaccine is only given in a clinic, pharmacy, doctor's office, or other health care setting and will not be stored at home. NOTE: This sheet  is a summary. It may not cover all possible information. If you have questions about this medicine, talk to your doctor, pharmacist, or health care provider.  2018  Elsevier/Gold Standard (2008-07-09 09:30:40)   Td Vaccine (Tetanus and Diphtheria): What You Need to Know 1. Why get vaccinated? Tetanus  and diphtheria are very serious diseases. They are rare in the [place] today, but people who do become infected often have severe complications. Td vaccine is used to protect adolescents and adults from both of these diseases. Both tetanus and diphtheria are infections caused by bacteria. Diphtheria spreads from person to person through coughing or sneezing. Tetanus-causing bacteria enter the body through cuts, scratches, or wounds. TETANUS (lockjaw) causes painful muscle tightening and stiffness, usually all over the body.  It can lead to tightening of muscles in the head and neck so you can't open your mouth, swallow, or sometimes even breathe. Tetanus kills about 1 out of every 10 people who are infected even after receiving the best medical care.  DIPHTHERIA can cause a thick coating to form in the back of the throat.  It can lead to breathing problems, paralysis, heart failure, and death.  Before vaccines, as many as 200,000 cases of diphtheria and hundreds of cases of tetanus were reported in the [place] each year. Since vaccination began, reports of cases for both diseases have dropped by about 99%. 2. Td vaccine Td vaccine can protect adolescents and adults from tetanus and diphtheria. Td is usually given as a booster dose every 10 years but it can also be given earlier after a severe and dirty wound or burn. Another vaccine, called Tdap, which protects against pertussis in addition to tetanus and diphtheria, is sometimes recommended instead of Td vaccine. Your doctor or the person giving you the vaccine can give you more information. Td may safely be given at the same time as other vaccines. 3. Some people should not get this vaccine  A person who has ever had a life-threatening allergic reaction after a previous dose of any tetanus or  diphtheria containing vaccine, OR has a severe allergy to any part of this vaccine, should not get Td vaccine. Tell the person giving the vaccine about any severe allergies.  Talk to your doctor if you: ? had severe pain or swelling after any vaccine containing diphtheria or tetanus, ? ever had a condition called Guillain Barre Syndrome (GBS), ? aren't feeling well on the day the shot is scheduled. 4. What are the risks from Td vaccine? With any medicine, including vaccines, there is a chance of side effects. These are usually mild and go away on their own. Serious reactions are also possible but are rare. Most people who get Td vaccine do not have any problems with it. Mild problems following Td vaccine: (Did not interfere with activities)  Pain where the shot was given (about 8 people in 10)  Redness or swelling where the shot was given (about 1 person in 4)  Mild fever (rare)  Headache (about 1 person in 4)  Tiredness (about 1 person in 4)  Moderate problems following Td vaccine: (Interfered with activities, but did not require medical attention)  Fever over 102F (rare)  Severe problems following Td vaccine: (Unable to perform usual activities; required medical attention)  Swelling, severe pain, bleeding and/or redness in the arm where the shot was given (rare).  Problems that could happen after any vaccine:  People sometimes faint after a medical procedure, including vaccination. Sitting or lying  down for about 15 minutes can help prevent fainting, and injuries caused by a fall. Tell your doctor if you feel dizzy, or have vision changes or ringing in the ears.  Some people get severe pain in the shoulder and have difficulty moving the arm where a shot was given. This happens very rarely.  Any medication can cause a severe allergic reaction. Such reactions from a vaccine are very rare, estimated at fewer than 1 in a million doses, and would happen within a few minutes to a  few hours after the vaccination. As with any medicine, there is a very remote chance of a vaccine causing a serious injury or death. The safety of vaccines is always being monitored. For more information, visit: http://www.aguilar.org/ 5. What if there is a serious reaction? What should I look for? Look for anything that concerns you, such as signs of a severe allergic reaction, very high fever, or unusual behavior. Signs of a severe allergic reaction can include hives, swelling of the face and throat, difficulty breathing, a fast heartbeat, dizziness, and weakness. These would usually start a few minutes to a few hours after the vaccination. What should I do?  If you think it is a severe allergic reaction or other emergency that can't wait, call 9-1-1 or get the person to the nearest hospital. Otherwise, call your doctor.  Afterward, the reaction should be reported to the Vaccine Adverse Event Reporting System (VAERS). Your doctor might file this report, or you can do it yourself through the VAERS web site at www.vaers.SamedayNews.es, or by calling 2315167386. ? VAERS does not give medical advice. 6. The National Vaccine Injury Compensation Program The [employer] Vaccine Injury Compensation Program (VICP) is a federal program that was created to compensate people who may have been injured by certain vaccines. Persons who believe they may have been injured by a vaccine can learn about the program and about filing a claim by calling 361-042-7951 or visiting the [place] website at GoldCloset.com.ee. There is a time limit to file a claim for compensation. 7. How can I learn more?  Ask your doctor. He or she can give you the vaccine package insert or suggest other sources of information.  Call your local or state health department.  Contact the Centers for Disease Control and Prevention (CDC): ? Call (320) 342-5095 (1-800-CDC-INFO) ? Visit CDC's website at http://hunter.com/ CDC Td  Vaccine VIS (04/05/16) This information is not intended to replace advice given to you by your health care provider. Make sure you discuss any questions you have with your health care provider. Document Released: 10/09/2006 Document Revised: 09/01/2016 Document Reviewed: 09/01/2016 Elsevier Interactive Patient Education  2017 [employer].

## 2017-11-07 NOTE — Progress Notes (Signed)
Patient ID: Misty Bailey, female    DOB: [birth year]  MRN: [id number]  CC: Annual Exam and Gynecologic Exam   Subjective: Misty Bailey is a 46 y.o. female who presents for physical. Last seen 05/2017 Her concerns today include:  Hx of seasonal allergies  1. GYN: Pt is G2P0 (miscarriage and still born) -no abnormal paps -always has a slight discgh but nothing abnormal. -not sexually active -menses regular lasting 4-5 days. Heavy first 3 days. Clotting more with age. Bad cramps to the extent that she has to leave work every mth for 1 day. Takes Ibuprofen 800 mg, makes her sleepy and dizzy which is why she does not take it at work. Needs FMLA  HM: due for HIV, Tdap, Flu, lipid   Patient Active Problem List   Diagnosis Date Noted  . Costochondritis 03/26/2014  . Herpes 03/21/2014  . Suppurative hidradenitis   . Depression   . Pap smear for cervical cancer screening 08/09/2013  . PE (physical exam), annual 08/09/2013  . Vaginal discharge 08/09/2013     Current Outpatient Medications on File Prior to Visit  Medication Sig Dispense Refill  . albuterol (PROVENTIL HFA;VENTOLIN HFA) 108 (90 Base) MCG/ACT inhaler Inhale 2 puffs into the lungs every 6 (six) hours as needed for wheezing or shortness of breath. 1 Inhaler 4  . ciprofloxacin (CIPRO) 500 MG tablet     . fluticasone (FLONASE) 50 MCG/ACT nasal spray Place 2 sprays into both nostrils daily. 16 g 6  . ibuprofen (ADVIL,MOTRIN) 800 MG tablet Take 1 tablet (800 mg total) by mouth every 8 (eight) hours as needed for moderate pain. 30 tablet 1  . terbinafine (LAMISIL) 250 MG tablet Take 1 tablet (250 mg total) daily by mouth. (Patient not taking: Reported on 11/07/2017) 60 tablet 0   No current facility-administered medications on file prior to visit.     Allergies  Allergen Reactions  . Codeine Shortness Of Breath    Social History   Socioeconomic History  . Marital status: Single    Spouse name: Not on file  .  Number of children: 0  . Years of education: Associates  . Highest education level: Not on file  Social Needs  . Financial resource strain: Not on file  . Food insecurity - worry: Not on file  . Food insecurity - inability: Not on file  . Transportation needs - medical: Not on file  . Transportation needs - non-medical: Not on file  Occupational History  . Occupation: Animal nutritionist: [employer]  Tobacco Use  . Smoking status: Former Research scientist (life sciences)  . Smokeless tobacco: Current User  . Tobacco comment: Weed  Substance and Sexual Activity  . Alcohol use: Yes    Alcohol/week: 0.0 oz    Comment: Occassionally   . Drug use: No  . Sexual activity: Not Currently    Partners: Male    Birth control/protection: None  Other Topics Concern  . Not on file  Social History Narrative   Boyfriend incarcerated 2014-2018    Family History  Problem Relation Age of Onset  . Diabetes Mother   . Hypertension Mother   . Hyperlipidemia Mother   . Heart murmur Mother   . Hypertension Father   . Stroke Father   . Hyperlipidemia Father   . Hypertension Maternal Grandmother   . Diabetes Maternal Grandmother   . Osteoporosis Maternal Grandmother   . Thyroid disease Paternal Aunt     Past Surgical History:  Procedure  Laterality Date  . FOOT SURGERY    . PELVIC LAPAROSCOPY  1998/1999 `   pelvic adhesions and pain    ROS: Review of Systems  Constitutional: Positive for activity change (Had surgery on LT foot recently . So has not been as active since then). Negative for appetite change.  HENT: Negative for hearing loss and postnasal drip.   Eyes: Negative for visual disturbance.  Respiratory: Negative for chest tightness and shortness of breath.   Cardiovascular: Negative for chest pain.  Gastrointestinal:       Seen in ER for abdominal pain end of July. Told she has diverticulitis by CT. Saw GI. Has colonoscopy schedule.  Skin: Positive for rash (under breast x 2 wks. Itchy.  ).        Gets frequent boils in genital area or on buttock. Use to get as child but not as often as she gets them now. Wears cotton underwear.     PHYSICAL EXAM: BP 123/80   Pulse (!) 57   Temp 98.6 F (37 C) (Oral)   Resp 16   Wt 135 lb 12.8 oz (61.6 kg)   SpO2 97%   BMI 25.04 kg/m   Physical Exam General appearance - alert, well appearing, and in no distress Mental status - alert, oriented to person, place, and time, normal mood, behavior, speech, dress, motor activity, and thought processes Eyes - pupils equal and reactive, extraocular eye movements intact Ears -small amount of soft wax buildup in both ears  nose - normal and patent, no erythema, discharge or polyps Mouth - mucous membranes moist, pharynx normal without lesions.  Bony overgrowth of hard palate Neck - supple, no significant adenopathy Lymphatics - no palpable lymphadenopathy, no hepatosplenomegaly Chest - clear to auscultation, no wheezes, rales or rhonchi, symmetric air entry Heart - normal rate, regular rhythm, normal S1, S2, no murmurs, rubs, clicks or gallops Abdomen -soft.  Mild bilateral lower quadrant tenderness.  No organomegaly Breasts - breasts appear normal, no suspicious masses, no skin or nipple changes or axillary nodes Pelvic - normal external genitalia, vulva, vagina, cervix, uterus and adnexa Extremities - peripheral pulses normal, no pedal edema, no clubbing or cyanosis Skin -hyperpigmented shiny rash noted below both breasts.  Noted to have some scarring upper inner buttocks and and perineal area.  Small resolving boil right perineal area      Chemistry      Component Value Date/Time   NA 139 07/30/2017 1308   K 4.2 07/30/2017 1308   CL 108 07/30/2017 1308   CO2 24 07/30/2017 1308   BUN 6 07/30/2017 1308   CREATININE 1.07 (H) 07/30/2017 1308   CREATININE 0.86 03/20/2014 1258      Component Value Date/Time   CALCIUM 9.1 07/30/2017 1308   ALKPHOS 84 07/30/2017 1308   AST 14 (L) 07/30/2017  1308   ALT 10 (L) 07/30/2017 1308   BILITOT 0.5 07/30/2017 1308      Lab Results  Component Value Date   WBC 6.3 07/30/2017   HGB 12.6 07/30/2017   HCT 39.0 07/30/2017   MCV 88.4 07/30/2017   PLT 400 07/30/2017   CT scan abdomen 07/2017 IMPRESSION: 1. Recommend follow-up Colonoscopy: abnormal 4.5 cm segment of the sigmoid colon which may be due to an acute diverticulitis, but has a morphology such that a Colonic Adenocarcinoma is difficult to exclude. 2. Small volume of mildly complex appearing pelvic free fluid could be reactive due to #1 or physiologic (due to a left ovarian cyst).  There is no abscess or free air. 3. Small hypodense areas at the superior right hepatic lobe are new since 2006, and indeterminate in the setting of #1. If colon cancer is excluded by colonoscopy then these would most likely be benign hepatic cysts or hemangiomas. 4. Gallbladder sludge and/or stones. 5. Left nephrolithiasis. 6. Mild atherosclerosis of the distal abdominal aorta. 7. Salient findings on this exam discussed by telephone with Dr. Arnetha Massy on 07/30/2017 at 17:42 .  ASSESSMENT AND PLAN: 1. Annual physical exam - Lipid panel - Cytology - PAP  2. Pap smear for cervical cancer screening  3. Need for influenza vaccination given  4. Need for Tdap vaccination - Tdap vaccine greater than or equal to 7yo IM - Flu Vaccine QUAD 6+ mos PF IM (Fluarix Quad PF)  5. Intertriginous candidiasis Advised to keep skin under the breasts dry and clean.  Prescription given for nystatin powder  6. Recurrent boils -Patient told to avoid shaving in the groin area.  Wear cotton underwear.  7. Abnormal abdominal CT scan -Followed by GI.  Patient reports that she had spoken with the gastroenterologist about the lesion seen in the right lobe of the liver.  States she was told by him that these are most likely hepatic cysts.  She has a colonoscopy scheduled  8. Dysmenorrhea Continue ibuprofen as  needed. She will bring in FMLA form at a later date   Patient was given the opportunity to ask questions.  Patient verbalized understanding of the plan and was able to repeat key elements of the plan.   Orders Placed This Encounter  Procedures  . Tdap vaccine greater than or equal to 7yo IM  . Flu Vaccine QUAD 6+ mos PF IM (Fluarix Quad PF)  . Lipid panel     Requested Prescriptions   Signed Prescriptions Disp Refills  . nystatin (NYSTATIN) powder 15 g 2    Sig: Apply 4 (four) times daily topically.    Return in about 4 months (around 03/07/2018).  Karle Plumber, MD, FACP

## 2017-11-08 LAB — CYTOLOGY - PAP
DIAGNOSIS: NEGATIVE
HPV: NOT DETECTED

## 2017-11-08 LAB — LIPID PANEL
Chol/HDL Ratio: 4.4 ratio (ref 0.0–4.4)
Cholesterol, Total: 244 mg/dL — ABNORMAL HIGH (ref 100–199)
HDL: 56 mg/dL (ref >39)
LDL Calculated: 163 mg/dL — ABNORMAL HIGH (ref 0–99)
Triglycerides: 124 mg/dL (ref 0–149)
VLDL Cholesterol Cal: 25 mg/dL (ref 5–40)

## 2017-11-21 ENCOUNTER — Ambulatory Visit (AMBULATORY_SURGERY_CENTER): Payer: 59 | Admitting: Gastroenterology

## 2017-11-21 ENCOUNTER — Encounter: Payer: Self-pay | Admitting: Gastroenterology

## 2017-11-21 VITALS — BP 110/77 | HR 55 | Temp 97.1°F | Resp 14 | Ht 61.0 in | Wt 134.0 lb

## 2017-11-21 DIAGNOSIS — K5732 Diverticulitis of large intestine without perforation or abscess without bleeding: Secondary | ICD-10-CM | POA: Diagnosis present

## 2017-11-21 DIAGNOSIS — R933 Abnormal findings on diagnostic imaging of other parts of digestive tract: Secondary | ICD-10-CM

## 2017-11-21 MED ORDER — SODIUM CHLORIDE 0.9 % IV SOLN: 500.0000 mL | INTRAVENOUS | Status: DC

## 2017-11-21 NOTE — Progress Notes (Signed)
No changes in health history 

## 2017-11-21 NOTE — Progress Notes (Signed)
Report given to PACU, vss 

## 2017-11-21 NOTE — Op Note (Signed)
[place] Patient Name: Misty Bailey Procedure Date: 11/21/2017 9:47 AM MRN: [id number] Endoscopist: Crane. Loletha Carrow , MD Age: 46 Referring MD:  Date of Birth: July 11, 1971 Gender: Female Account #: [id number] Procedure:                Colonoscopy Indications:              Abnormal CT of the GI tract (sigmoid colon),                            Follow-up of diverticulitis Medicines:                Monitored Anesthesia Care Procedure:                Pre-Anesthesia Assessment:                           - Prior to the procedure, a History and Physical                            was performed, and patient medications and                            allergies were reviewed. The patient's tolerance of                            previous anesthesia was also reviewed. The risks                            and benefits of the procedure and the sedation                            options and risks were discussed with the patient.                            All questions were answered, and informed consent                            was obtained. Prior Anticoagulants: The patient has                            taken no previous anticoagulant or antiplatelet                            agents. ASA Grade Assessment: II - A patient with                            mild systemic disease. After reviewing the risks                            and benefits, the patient was deemed in                            satisfactory condition to undergo the procedure.  After obtaining informed consent, the colonoscope                            was passed under direct vision. Throughout the                            procedure, the patient's blood pressure, pulse, and                            oxygen saturations were monitored continuously. The                            Colonoscope was introduced through the anus and                            advanced to the the terminal ileum.  The colonoscopy                            was performed without difficulty. The patient                            tolerated the procedure well. The quality of the                            bowel preparation was excellent. The terminal                            ileum, ileocecal valve, appendiceal orifice, and                            rectum were photographed. The quality of the bowel                            preparation was evaluated using the BBPS [facility]                            Bowel Preparation Scale) with scores of: Right                            Colon = 3, Transverse Colon = 3 and Left Colon = 3                            (entire mucosa seen well with no residual staining,                            small fragments of stool or opaque liquid). The                            total BBPS score equals 9. The bowel preparation                            used was SUPREP. Scope In: 9:51:59 AM Scope Out: 10:03:19 AM Scope Withdrawal Time: 0  hours 7 minutes 16 seconds  Total Procedure Duration: 0 hours 11 minutes 20 seconds  Findings:                 The perianal and digital rectal examinations were                            normal.                           The terminal ileum appeared normal.                           Several diminutive erosions were found in the cecum                            and appendiceal orifice.                           Many small-mouthed diverticula were found in the                            left colon with associated tortuosity.                           Retroflexion in the rectum was not performed due to                            anatomy.                           The exam was otherwise without abnormality. Complications:            No immediate complications. Estimated Blood Loss:     Estimated blood loss: none. Impression:               - The examined portion of the ileum was normal.                           - Several erosions in the cecum and  at the                            appendiceal orifice. These do not appear to be of                            clinical significance, and are commonly the result                            of aspirin or NSAID use.                           - Diverticulosis in the left colon.                           - The examination was otherwise normal.                           -  No specimens collected. Recommendation:           - Patient has a contact number available for                            emergencies. The signs and symptoms of potential                            delayed complications were discussed with the                            patient. Return to normal activities tomorrow.                            Written discharge instructions were provided to the                            patient.                           - Resume previous diet.                           - Continue present medications.                           - Repeat colonoscopy in 10 years for screening                            purposes.  L. Loletha Carrow, MD 11/21/2017 10:13:46 AM This report has been signed electronically.

## 2017-11-21 NOTE — Patient Instructions (Signed)
YOU HAD AN ENDOSCOPIC PROCEDURE TODAY AT [facility]:   Refer to the procedure report that was given to you for any specific questions about what was found during the examination.  If the procedure report does not answer your questions, please call your gastroenterologist to clarify.  If you requested that your care partner not be given the details of your procedure findings, then the procedure report has been included in a sealed envelope for you to review at your convenience later.  YOU SHOULD EXPECT: Some feelings of bloating in the abdomen. Passage of more gas than usual.  Walking can help get rid of the air that was put into your GI tract during the procedure and reduce the bloating. If you had a lower endoscopy (such as a colonoscopy or flexible sigmoidoscopy) you may notice spotting of blood in your stool or on the toilet paper. If you underwent a bowel prep for your procedure, you may not have a normal bowel movement for a few days.  Please Note:  You might notice some irritation and congestion in your nose or some drainage.  This is from the oxygen used during your procedure.  There is no need for concern and it should clear up in a day or so.  SYMPTOMS TO REPORT IMMEDIATELY:   Following lower endoscopy (colonoscopy or flexible sigmoidoscopy):  Excessive amounts of blood in the stool  Significant tenderness or worsening of abdominal pains  Swelling of the abdomen that is new, acute  Fever of 100F or higher  For urgent or emergent issues, a gastroenterologist can be reached at any hour by calling [phone].   DIET:  We do recommend a small meal at first, but then you may proceed to your regular diet.  Drink plenty of fluids but you should avoid alcoholic beverages for 24 hours.  ACTIVITY:  You should plan to take it easy for the rest of today and you should NOT DRIVE or use heavy machinery until tomorrow (because of the sedation medicines used during the test).     FOLLOW UP: Our staff will call the number listed on your records the next business day following your procedure to check on you and address any questions or concerns that you may have regarding the information given to you following your procedure. If we do not reach you, we will leave a message.  However, if you are feeling well and you are not experiencing any problems, there is no need to return our call.  We will assume that you have returned to your regular daily activities without incident.  SIGNATURES/CONFIDENTIALITY: You and/or your care partner have signed paperwork which will be entered into your electronic medical record.  These signatures attest to the fact that that the information above on your After Visit Summary has been reviewed and is understood.  Full responsibility of the confidentiality of this discharge information lies with you and/or your care-partner.  Next colonoscopy- 10 years  Continue your normal medications  Please read over handout about diverticulosis

## 2017-11-22 ENCOUNTER — Telehealth: Payer: Self-pay

## 2017-11-22 NOTE — Telephone Encounter (Signed)
  Follow up Call-  Call back number 11/21/2017  Post procedure Call Back phone  # [phone]  Permission to leave phone message Yes  Some recent data might be hidden     Patient questions:  Do you have a fever, pain , or abdominal swelling? No. Pain Score  0 *  Have you tolerated food without any problems? Yes.    Have you been able to return to your normal activities? Yes.    Do you have any questions about your discharge instructions: Diet   No. Medications  No. Follow up visit  No.  Do you have questions or concerns about your Care? No.  Actions: * If pain score is 4 or above: No action needed, pain <4.

## 2017-11-29 ENCOUNTER — Encounter: Payer: 59 | Admitting: Podiatry

## 2017-12-06 ENCOUNTER — Ambulatory Visit: Payer: 59 | Admitting: Podiatry

## 2017-12-28 ENCOUNTER — Encounter: Payer: Self-pay | Admitting: Internal Medicine

## 2017-12-28 NOTE — Telephone Encounter (Signed)
Patient mychart response

## 2018-01-03 ENCOUNTER — Encounter: Payer: Self-pay | Admitting: Internal Medicine

## 2018-01-15 ENCOUNTER — Encounter: Payer: Self-pay | Admitting: Internal Medicine

## 2018-01-18 ENCOUNTER — Encounter: Payer: Self-pay | Admitting: Internal Medicine

## 2018-01-19 NOTE — Telephone Encounter (Signed)
Patient mychart response

## 2018-01-24 ENCOUNTER — Ambulatory Visit (INDEPENDENT_AMBULATORY_CARE_PROVIDER_SITE_OTHER): Payer: 59 | Admitting: Podiatry

## 2018-01-24 ENCOUNTER — Ambulatory Visit (INDEPENDENT_AMBULATORY_CARE_PROVIDER_SITE_OTHER): Payer: 59

## 2018-01-24 ENCOUNTER — Encounter: Payer: Self-pay | Admitting: Podiatry

## 2018-01-24 DIAGNOSIS — L309 Dermatitis, unspecified: Secondary | ICD-10-CM | POA: Diagnosis not present

## 2018-01-24 DIAGNOSIS — M2022 Hallux rigidus, left foot: Secondary | ICD-10-CM

## 2018-01-24 DIAGNOSIS — M779 Enthesopathy, unspecified: Secondary | ICD-10-CM

## 2018-01-24 NOTE — Progress Notes (Signed)
Subjective:   Patient ID: Misty Bailey, female   DOB: 47 y.o.   MRN: [id number]   HPI Patient presents stating the big toe joint seems better and she is concerned because she still has discoloration of the nails that she has questions about   ROS      Objective:  Physical Exam  Neurovascular status intact with improvement of motion of the first MPJ left with no crepitus of the joint and nails which are improving but are still present with mild discomfort still of the first MPJ upon deep palpation.     Assessment:  The motion has improved quite a bit with no crepitus of the joint but there is some low-grade inflammation of the first MPJ along with continued fungal infection more distal in nature     Plan:  X-rays reviewed with patient and at this point discussed careful injection which may be necessary in future for the big toe joint.  I did go ahead and discussed the antifungal and I do think it will be another 6 months before we know entirely how it has progressed  X-ray indicates that the osteotomy is healing well joint congruous pins in place

## 2018-01-24 NOTE — Progress Notes (Signed)
   Subjective:    Patient ID: Misty Bailey, female    DOB: 09-May-1971, 47 y.o.   MRN: [id number]  HPI    Review of Systems  All other systems reviewed and are negative.      Objective:   Physical Exam        Assessment & Plan:

## 2018-01-25 ENCOUNTER — Ambulatory Visit: Payer: 59 | Attending: Internal Medicine | Admitting: Physician Assistant

## 2018-01-25 ENCOUNTER — Ambulatory Visit (HOSPITAL_COMMUNITY)
Admission: RE | Admit: 2018-01-25 | Discharge: 2018-01-25 | Disposition: A | Discharge status: Home / Self Care | Payer: 59 | Source: Ambulatory Visit | Attending: Physician Assistant | Admitting: Physician Assistant

## 2018-01-25 VITALS — BP 122/82 | HR 68 | Temp 98.6°F | Resp 18 | Ht 61.0 in | Wt 131.2 lb

## 2018-01-25 DIAGNOSIS — J40 Bronchitis, not specified as acute or chronic: Secondary | ICD-10-CM | POA: Diagnosis not present

## 2018-01-25 DIAGNOSIS — R05 Cough: Secondary | ICD-10-CM | POA: Insufficient documentation

## 2018-01-25 DIAGNOSIS — R531 Weakness: Secondary | ICD-10-CM | POA: Diagnosis present

## 2018-01-25 DIAGNOSIS — R509 Fever, unspecified: Secondary | ICD-10-CM | POA: Insufficient documentation

## 2018-01-25 DIAGNOSIS — Z885 Allergy status to narcotic agent status: Secondary | ICD-10-CM | POA: Diagnosis not present

## 2018-01-25 DIAGNOSIS — J9801 Acute bronchospasm: Secondary | ICD-10-CM | POA: Diagnosis not present

## 2018-01-25 DIAGNOSIS — M1990 Unspecified osteoarthritis, unspecified site: Secondary | ICD-10-CM | POA: Diagnosis not present

## 2018-01-25 DIAGNOSIS — Z79899 Other long term (current) drug therapy: Secondary | ICD-10-CM | POA: Diagnosis not present

## 2018-01-25 MED ORDER — AZITHROMYCIN 250 MG PO TABS: ORAL_TABLET | ORAL | 0 refills | Status: DC

## 2018-01-25 MED ORDER — PREDNISONE 10 MG PO TABS: ORAL_TABLET | ORAL | 0 refills | Status: DC

## 2018-01-25 MED ORDER — ALBUTEROL SULFATE HFA 108 (90 BASE) MCG/ACT IN AERS: 2.0000 | INHALATION_SPRAY | Freq: Four times a day (QID) | RESPIRATORY_TRACT | 4 refills | Status: DC | PRN

## 2018-01-25 MED ORDER — BENZONATATE 100 MG PO CAPS: 200.0000 mg | ORAL_CAPSULE | Freq: Three times a day (TID) | ORAL | 0 refills | Status: DC | PRN

## 2018-01-25 MED ORDER — ALBUTEROL SULFATE (2.5 MG/3ML) 0.083% IN NEBU
2.5000 mg | INHALATION_SOLUTION | Freq: Once | RESPIRATORY_TRACT | Status: AC
  Administered 2018-01-25: 2.5 mg via RESPIRATORY_TRACT

## 2018-01-25 NOTE — Progress Notes (Signed)
Patient is here for cough with back pain, chills, and sore throat.   Patient stated she been sick for two weeks and tried OTC medication, but no help.   Patient stated she tried Nyquil and Severe cold and flu.

## 2018-01-25 NOTE — Progress Notes (Signed)
Patient ID: Misty Bailey, female   DOB: [birth year], 47 y.o.   MRN: [id number]     Misty Bailey, is a 47 y.o. female  SJG:[id number]  [id number]  DOB - 06-05-1971  Subjective:  Chief Complaint and HPI: Misty Bailey is a 47 y.o. female here today feeling sick. 2 week h/o [name], cold, chills, weakness that is getting progressively worse.  Mucus is thick and gree.  She thinks she has had low-grade fever vut hasn't been higher than 99 when she checked.  Mid-upper back pain started yesterday.  No N/V/D.  Feels like she is wheezing and is out of her inhaler.     ROS:   Constitutional:  No f/c, No night sweats, No unexplained weight loss. EENT:  No vision changes, No blurry vision, No hearing changes. +ST and nasal congestion, runny nose at times.   Respiratory: + cough, + SOB Cardiac: No CP, no palpitations GI:  No abd pain, No N/V/D. GU: No Urinary s/sx Musculoskeletal: +mid-upper back pain Neuro: No headache, no dizziness, no motor weakness.  Skin: No rash Endocrine:  No polydipsia. No polyuria.  Psych: Denies SI/HI  No problems updated.  ALLERGIES: Allergies  Allergen Reactions  . Codeine Shortness Of Breath    PAST MEDICAL HISTORY: Past Medical History:  Diagnosis Date  . Anemia   . Arthritis 02/2014   Rib Cage  . Depression   . Panic attacks   . Suppurative hidradenitis     MEDICATIONS AT HOME: Prior to Admission medications   Medication Sig Start Date End Date Taking? Authorizing Provider  albuterol (PROVENTIL HFA;VENTOLIN HFA) 108 (90 Base) MCG/ACT inhaler Inhale 2 puffs into the lungs every 6 (six) hours as needed for wheezing or shortness of breath. 01/25/18   Argentina Donovan, PA-C  azithromycin (ZITHROMAX) 250 MG tablet Take 2 today then 2 daily 01/25/18   Argentina Donovan, PA-C  benzonatate (TESSALON) 100 MG capsule Take 2 capsules (200 mg total) by mouth 3 (three) times daily as needed for cough. 01/25/18   Argentina Donovan, PA-C    fluticasone (FLONASE) 50 MCG/ACT nasal spray Place 2 sprays into both nostrils daily. Patient not taking: Reported on 01/25/2018 06/09/17   Ladell Pier, MD  ibuprofen (ADVIL,MOTRIN) 800 MG tablet Take 1 tablet (800 mg total) by mouth every 8 (eight) hours as needed for moderate pain. Patient not taking: Reported on 01/25/2018 06/09/17   Ladell Pier, MD  nystatin (NYSTATIN) powder Apply 4 (four) times daily topically. Patient not taking: Reported on 01/25/2018 11/07/17   Ladell Pier, MD  predniSONE (DELTASONE) 10 MG tablet 6,5,4,3,2,1 take each days dos in am with food 01/25/18   Freeman Caldron M, PA-C  terbinafine (LAMISIL) 250 MG tablet Take 1 tablet (250 mg total) daily by mouth. Patient not taking: Reported on 11/07/2017 10/30/17   Wallene Huh, DPM     Objective:  EXAM:   Vitals:   01/25/18 1005  BP: 122/82  Pulse: 68  Resp: 18  Temp: 98.6 F (37 C)  TempSrc: Oral  SpO2: 98%  Weight: 131 lb 3.2 oz (59.5 kg)  Height: 5\' 1"  (1.549 m)    General appearance : A&OX3. NAD. Non-toxic-appearing HEENT: Atraumatic and Normocephalic.  PERRLA. EOM intact.  TM full B. Mouth-MMM, post pharynx w/ erythema, + PND. Neck: supple, no JVD. No cervical lymphadenopathy. No thyromegaly Chest/Lungs:  Breathing-non-labored, fair air entry B, there is mild to moderate wheezing diffusely throughout.  No rales.  No rhonchi.  Extremities: Bilateral Lower Ext shows no edema, both legs are warm to touch with = pulse throughout Neurology:  CN II-XII grossly intact, Non focal.   Psych:  TP linear. J/I WNL. Normal speech. Appropriate eye contact and affect.  Skin:  No Rash  Data Review Lab Results  Component Value Date   HGBA1C 5.8 03/20/2014     Assessment & Plan   1. Bronchitis R/o pneumonia - DG Chest 2 View; Future - azithromycin (ZITHROMAX) 250 MG tablet; Take 2 today then 2 daily  Dispense: 6 tablet; Refill: 0 - CBC with Differential/Platelet - benzonatate (TESSALON)  100 MG capsule; Take 2 capsules (200 mg total) by mouth 3 (three) times daily as needed for cough.  Dispense: 40 capsule; Refill: 0  2. Bronchospasm - albuterol (PROVENTIL) (2.5 MG/3ML) 0.083% nebulizer solution 2.5 mg.  Some improvement after in office breathing treatment.   - predniSONE (DELTASONE) 10 MG tablet; 6,5,4,3,2,1 take each days dos in am with food  Dispense: 21 tablet; Refill: 0 - albuterol (PROVENTIL HFA;VENTOLIN HFA) 108 (90 Base) MCG/ACT inhaler; Inhale 2 puffs into the lungs every 6 (six) hours as needed for wheezing or shortness of breath.  Dispense: 1 Inhaler; Refill: 4   Patient have been counseled extensively about nutrition and exercise  Return in about 2 months (around 03/25/2018) for Dr Wynetta Emery for medical issues.  The patient was given clear instructions to go to ER or return to medical center if symptoms don't improve, worsen or new problems develop. The patient verbalized understanding. The patient was told to call to get lab results if they haven't heard anything in the next week.     Freeman Caldron, PA-C [facility] [place], [place]   01/25/2018, 10:32 AM

## 2018-01-26 ENCOUNTER — Telehealth: Payer: Self-pay

## 2018-01-26 LAB — CBC WITH DIFFERENTIAL/PLATELET
BASOS: 0 %
Basophils Absolute: 0 10*3/uL (ref 0.0–0.2)
EOS (ABSOLUTE): 0.2 10*3/uL (ref 0.0–0.4)
EOS: 4 %
HEMATOCRIT: 42.4 % (ref 34.0–46.6)
Hemoglobin: 13.4 g/dL (ref 11.1–15.9)
IMMATURE GRANULOCYTES: 0 %
Immature Grans (Abs): 0 10*3/uL (ref 0.0–0.1)
LYMPHS ABS: 2.7 10*3/uL (ref 0.7–3.1)
Lymphs: 48 %
MCH: 28.9 pg (ref 26.6–33.0)
MCHC: 31.6 g/dL (ref 31.5–35.7)
MCV: 91 fL (ref 79–97)
MONOS ABS: 0.3 10*3/uL (ref 0.1–0.9)
Monocytes: 6 %
NEUTROS ABS: 2.3 10*3/uL (ref 1.4–7.0)
NEUTROS PCT: 42 %
Platelets: 279 10*3/uL (ref 150–379)
RBC: 4.64 x10E6/uL (ref 3.77–5.28)
RDW: 13.9 % (ref 12.3–15.4)
WBC: 5.6 10*3/uL (ref 3.4–10.8)

## 2018-01-26 NOTE — Telephone Encounter (Signed)
-----   Message from Argentina Donovan, [place] sent at 01/25/2018  4:22 PM EST ----- Your chest xray was negative.  Take the medications as prescribed and return to the clinic if your do not start to improve in the next few days.  Go to the emergency room if your symptoms worsen. Thanks,  Freeman Caldron, PA-C

## 2018-01-26 NOTE — Telephone Encounter (Signed)
-----   Message from Argentina Donovan, [place] sent at 01/26/2018  4:07 PM EST ----- Your blood count was normal.  Take meds as prescribed. Thanks, Freeman Caldron, PA-C

## 2018-01-26 NOTE — Telephone Encounter (Signed)
CMA attempt to reach patient regarding results and advising.  Patient did not answer. Left a VM and callback number for patient.  If patient call please let her know:  Your blood count was normal.  Take meds as prescribed. Thanks, Freeman Caldron, PA-C

## 2018-01-26 NOTE — Telephone Encounter (Signed)
CMA attempt to call patient regarding lab result.  Patient did not answer.  Letter will be send out.   CMA left a voicemail on home phone and call back number.  If patient call please let her know:  Your chest xray was negative.  Take the medications as prescribed and return to the clinic if your do not start to improve in the next few days.  Go to the emergency room if your symptoms worsen. Thanks,  Freeman Caldron, PA-C

## 2018-01-29 ENCOUNTER — Telehealth: Payer: Self-pay

## 2018-01-29 NOTE — Telephone Encounter (Signed)
CMA attempt to call patient to inform on lab result and advising.   Patient did not answer and the line was busy.   If patient please let her know:  Your blood count was normal.  Take meds as prescribed. Thanks, Freeman Caldron, PA-C

## 2018-01-29 NOTE — Telephone Encounter (Signed)
-----   Message from Argentina Donovan, [place] sent at 01/26/2018  4:07 PM EST ----- Your blood count was normal.  Take meds as prescribed. Thanks, Freeman Caldron, PA-C

## 2018-01-30 ENCOUNTER — Telehealth: Payer: Self-pay

## 2018-01-30 ENCOUNTER — Encounter: Payer: Self-pay | Admitting: Internal Medicine

## 2018-01-30 NOTE — Telephone Encounter (Signed)
Patient came in for a work note revised.  CMA informed her lab result, and xrays.   Patient understood.

## 2018-03-22 ENCOUNTER — Telehealth: Payer: Self-pay | Admitting: *Deleted

## 2018-03-22 NOTE — Telephone Encounter (Signed)
Pt request handicap sticker due to continued pain in her big toe. Routed message to scheduler to complete handicap sticker for 6 months and contact pt to pick up.

## 2018-03-30 ENCOUNTER — Ambulatory Visit: Payer: 59 | Admitting: Internal Medicine

## 2018-03-30 ENCOUNTER — Telehealth: Payer: Self-pay | Admitting: Podiatry

## 2018-03-30 NOTE — Telephone Encounter (Signed)
Pt called and wanted to get another handicap sign because the one she has now has expired. Pt states she's still in a lot of pain. She always wanted to know if she needed to come and fill out any paperwork for the new handicap sign. Her phone number is [phone]

## 2018-03-30 NOTE — Telephone Encounter (Signed)
Misty Bailey, I saw Valery's previous message to complete handicap placard, can you please follow up on this and call the patient to let her know it is done

## 2018-04-04 ENCOUNTER — Other Ambulatory Visit: Payer: Self-pay | Admitting: Internal Medicine

## 2018-04-04 ENCOUNTER — Other Ambulatory Visit: Payer: Self-pay | Admitting: Obstetrics & Gynecology

## 2018-04-04 DIAGNOSIS — Z1231 Encounter for screening mammogram for malignant neoplasm of breast: Secondary | ICD-10-CM

## 2018-04-05 ENCOUNTER — Telehealth: Payer: Self-pay | Admitting: Podiatry

## 2018-04-05 NOTE — Telephone Encounter (Signed)
I've been trying to call for a while now. I'm needing another temporary handicap placard as the one I currently have by Dr. Paulla Dolly is expired. Can I just come in to get the paperwork or do I have to make an appointment? No ma'am you can just walk in. We are open until 5:00 pm today and then tomorrow we will be open from 7:30 am - 4:00 pm. I will be in around 8:00 am tomorrow to pick up the paperwork.

## 2018-04-27 ENCOUNTER — Ambulatory Visit: Payer: 59

## 2018-05-08 ENCOUNTER — Ambulatory Visit: Payer: 59 | Attending: Internal Medicine | Admitting: Internal Medicine

## 2018-05-08 ENCOUNTER — Encounter: Payer: Self-pay | Admitting: Internal Medicine

## 2018-05-08 VITALS — BP 137/86 | HR 61 | Temp 98.3°F | Resp 16 | Wt 139.2 lb

## 2018-05-08 DIAGNOSIS — N946 Dysmenorrhea, unspecified: Secondary | ICD-10-CM | POA: Diagnosis present

## 2018-05-08 DIAGNOSIS — Z79899 Other long term (current) drug therapy: Secondary | ICD-10-CM | POA: Insufficient documentation

## 2018-05-08 DIAGNOSIS — Z6826 Body mass index (BMI) 26.0-26.9, adult: Secondary | ICD-10-CM | POA: Diagnosis not present

## 2018-05-08 DIAGNOSIS — Z823 Family history of stroke: Secondary | ICD-10-CM | POA: Diagnosis not present

## 2018-05-08 DIAGNOSIS — Z8249 Family history of ischemic heart disease and other diseases of the circulatory system: Secondary | ICD-10-CM | POA: Insufficient documentation

## 2018-05-08 DIAGNOSIS — R030 Elevated blood-pressure reading, without diagnosis of hypertension: Secondary | ICD-10-CM | POA: Insufficient documentation

## 2018-05-08 DIAGNOSIS — Z833 Family history of diabetes mellitus: Secondary | ICD-10-CM | POA: Diagnosis not present

## 2018-05-08 DIAGNOSIS — Z87891 Personal history of nicotine dependence: Secondary | ICD-10-CM | POA: Diagnosis not present

## 2018-05-08 DIAGNOSIS — E663 Overweight: Secondary | ICD-10-CM

## 2018-05-08 HISTORY — DX: Dysmenorrhea, unspecified: N94.6

## 2018-05-08 NOTE — Progress Notes (Signed)
Patient ID: Misty Bailey, female    DOB: June 19, 1971  MRN: [id number]  CC: Follow-up   Subjective: Misty Bailey is a 47 y.o. female who presents for routine f/u visit Her concerns today include:   Reports menses have been a little more regular occurring once a mth except last mth when it came on twice and she had to use two FMLA days.  Last 2 menses were 4/3-05/2018 and 4/18-21/2019.  Menses usually heavy with severe cramps. Has to take Ibuprofen the first two days. -she plans to reschedule MMG  Since last visit, saw Dr. Loletha Carrow with [facility] and had colonoscopy.  Showed diverticulosis LT colon.   BP noted to be elevated on check today.  "I've been eating out a lot due to lack of time management."  Gained 8 lbs since last visit.  Not exercising.   Patient Active Problem List   Diagnosis Date Noted  . Costochondritis 03/26/2014  . Herpes 03/21/2014  . Suppurative hidradenitis   . Depression   . Pap smear for cervical cancer screening 08/09/2013  . PE (physical exam), annual 08/09/2013  . Vaginal discharge 08/09/2013     Current Outpatient Medications on File Prior to Visit  Medication Sig Dispense Refill  . albuterol (PROVENTIL HFA;VENTOLIN HFA) 108 (90 Base) MCG/ACT inhaler Inhale 2 puffs into the lungs every 6 (six) hours as needed for wheezing or shortness of breath. 1 Inhaler 4  . fluticasone (FLONASE) 50 MCG/ACT nasal spray Place 2 sprays into both nostrils daily. (Patient not taking: Reported on 01/25/2018) 16 g 6  . ibuprofen (ADVIL,MOTRIN) 800 MG tablet Take 1 tablet (800 mg total) by mouth every 8 (eight) hours as needed for moderate pain. (Patient not taking: Reported on 01/25/2018) 30 tablet 1   No current facility-administered medications on file prior to visit.     Allergies  Allergen Reactions  . Codeine Shortness Of Breath    Social History   Socioeconomic History  . Marital status: Single    Spouse name: Not on file  . Number of children: 0  .  Years of education: Associates  . Highest education level: Not on file  Occupational History  . Occupation: Animal nutritionist: [employer]  Social Needs  . Financial resource strain: Not on file  . Food insecurity:    Worry: Not on file    Inability: Not on file  . Transportation needs:    Medical: Not on file    Non-medical: Not on file  Tobacco Use  . Smoking status: Former Research scientist (life sciences)  . Smokeless tobacco: Current User  . Tobacco comment: Weed  Substance and Sexual Activity  . Alcohol use: Yes    Alcohol/week: 0.0 oz    Comment: Occassionally   . Drug use: No  . Sexual activity: Not Currently    Partners: Male    Birth control/protection: None  Lifestyle  . Physical activity:    Days per week: Not on file    Minutes per session: Not on file  . Stress: Not on file  Relationships  . Social connections:    Talks on phone: Not on file    Gets together: Not on file    Attends religious service: Not on file    Active member of club or organization: Not on file    Attends meetings of clubs or organizations: Not on file    Relationship status: Not on file  . Intimate partner violence:    Fear of  current or ex partner: Not on file    Emotionally abused: Not on file    Physically abused: Not on file    Forced sexual activity: Not on file  Other Topics Concern  . Not on file  Social History Narrative   Boyfriend incarcerated 2014-2018    Family History  Problem Relation Age of Onset  . Diabetes Mother   . Hypertension Mother   . Hyperlipidemia Mother   . Heart murmur Mother   . Hypertension Father   . Stroke Father   . Hyperlipidemia Father   . Hypertension Maternal Grandmother   . Diabetes Maternal Grandmother   . Osteoporosis Maternal Grandmother   . Thyroid disease Paternal Aunt     Past Surgical History:  Procedure Laterality Date  . FOOT SURGERY    . PELVIC LAPAROSCOPY  1998/1999 `   pelvic adhesions and pain    ROS: Review of Systems Neg  except as above PHYSICAL EXAM: BP 137/86   Pulse 61   Temp 98.3 F (36.8 C) (Oral)   Resp 16   Wt 139 lb 3.2 oz (63.1 kg)   SpO2 97%   BMI 26.30 kg/m   Wt Readings from Last 3 Encounters:  05/08/18 139 lb 3.2 oz (63.1 kg)  01/25/18 131 lb 3.2 oz (59.5 kg)  11/21/17 134 lb (60.8 kg)    Physical Exam  General appearance - alert, well appearing, and in no distress Mental status - normal mood, behavior, speech, dress, motor activity, and thought processes Neck - supple, no significant adenopathy Chest - clear to auscultation, no wheezes, rales or rhonchi, symmetric air entry Heart - normal rate, regular rhythm, normal S1, S2, no murmurs, rubs, clicks or gallops Extremities - peripheral pulses normal, no pedal edema, no clubbing or cyanosis   ASSESSMENT AND PLAN: 1. Dysmenorrhea -continue Ibuprofen PRN.  Continue to keep log of menses.  If she has another episode of two or more menstrual bleeding in 1 mth, will get pelvic U/S and refer to GYN.  2. Elevated blood pressure reading Discussed DASH diet and encouraged to follow Encourage regular exercise at least 4 times a week or more  for 30 mins Return in 4-5 wks for repeat BP check  3. Over weight -Dietary counseling given.  Encouraged to cut back on eating out and choosing wisely when she does.   Patient was given the opportunity to ask questions.  Patient verbalized understanding of the plan and was able to repeat key elements of the plan.   No orders of the defined types were placed in this encounter.    Requested Prescriptions    No prescriptions requested or ordered in this encounter    Return in about 5 weeks (around 06/12/2018) for BP check.  Karle Plumber, MD, FACP

## 2018-05-08 NOTE — Patient Instructions (Signed)
Please remember to schedule your mammogram. Cut back on the amount of salt in your foods.    Follow a Healthy Eating Plan - You can do it! Limit sugary drinks.  Avoid sodas, sweet tea, sport or energy drinks, or fruit drinks.  Drink water, lo-fat milk, or diet drinks. Limit snack foods.   Cut back on candy, cake, cookies, chips, ice cream.  These are a special treat, only in small amounts. Eat plenty of vegetables.  Especially dark green, red, and orange vegetables. Aim for at least 3 servings a day. More is better! Include fruit in your daily diet.  Whole fruit is much healthier than fruit juice! Limit "white" bread, "white" pasta, "white" rice.   Choose "100% whole grain" products, brown or wild rice. Avoid fatty meats. Try "Meatless Monday" and choose eggs or beans one day a week.  When eating meat, choose lean meats like chicken, [place], and fish.  Grill, broil, or bake meats instead of frying, and eat poultry without the skin. Eat less salt.  Avoid frozen pizzas, frozen dinners and salty foods.  Use seasonings other than salt in cooking.  This can help blood pressure and keep you from swelling Beer, wine and liquor have calories.  If you can safely drink alcohol, limit to 1 drink per day for women, 2 drinks for men

## 2018-05-10 NOTE — Progress Notes (Signed)
This encounter was created in error - please disregard.

## 2018-05-29 ENCOUNTER — Encounter: Payer: Self-pay | Admitting: Internal Medicine

## 2018-06-01 ENCOUNTER — Ambulatory Visit (HOSPITAL_COMMUNITY)
Admission: EM | Admit: 2018-06-01 | Discharge: 2018-06-01 | Disposition: A | Discharge status: Home / Self Care | Payer: 59 | Attending: Family Medicine | Admitting: Family Medicine

## 2018-06-01 ENCOUNTER — Encounter (HOSPITAL_COMMUNITY): Payer: Self-pay

## 2018-06-01 ENCOUNTER — Other Ambulatory Visit: Payer: Self-pay

## 2018-06-01 DIAGNOSIS — H6123 Impacted cerumen, bilateral: Secondary | ICD-10-CM | POA: Diagnosis not present

## 2018-06-01 DIAGNOSIS — J4521 Mild intermittent asthma with (acute) exacerbation: Secondary | ICD-10-CM

## 2018-06-01 DIAGNOSIS — J069 Acute upper respiratory infection, unspecified: Secondary | ICD-10-CM

## 2018-06-01 DIAGNOSIS — B9789 Other viral agents as the cause of diseases classified elsewhere: Secondary | ICD-10-CM

## 2018-06-01 MED ORDER — PREDNISONE 20 MG PO TABS: 20.0000 mg | ORAL_TABLET | Freq: Two times a day (BID) | ORAL | 0 refills | Status: DC

## 2018-06-01 MED ORDER — DOXYCYCLINE HYCLATE 100 MG PO CAPS: 100.0000 mg | ORAL_CAPSULE | Freq: Two times a day (BID) | ORAL | 0 refills | Status: DC

## 2018-06-01 MED ORDER — BENZONATATE 100 MG PO CAPS: 100.0000 mg | ORAL_CAPSULE | Freq: Three times a day (TID) | ORAL | 0 refills | Status: DC

## 2018-06-01 MED ORDER — IPRATROPIUM-ALBUTEROL 0.5-2.5 (3) MG/3ML IN SOLN
RESPIRATORY_TRACT | Status: AC
  Filled 2018-06-01: qty 3

## 2018-06-01 MED ORDER — IPRATROPIUM-ALBUTEROL 0.5-2.5 (3) MG/3ML IN SOLN
3.0000 mL | Freq: Once | RESPIRATORY_TRACT | Status: AC
  Administered 2018-06-01: 3 mL via RESPIRATORY_TRACT

## 2018-06-01 NOTE — Discharge Instructions (Addendum)
Push fluids Rest Use a humidifier if you have Take Tessalon 2-3 times a day for cough Continue inhaler as needed Take both the antibiotic and the prednisone twice a day for 5 days Return if not better in a few days, or follow-up with PCP

## 2018-06-01 NOTE — ED Provider Notes (Signed)
[facility]    CSN: [id number] Arrival date & time: 06/01/18  1000     History   Chief Complaint Chief Complaint  Patient presents with  . Sore Throat    HPI Misty Bailey is a 47 y.o. female.   HPI  Patient with underlying asthma and allergies.  She had a difficult time is here with her allergies.  She states currently she has had a cough for 1 week.  She is short of breath.  Her inhaler does not feel like it is working.  She is producing some sputum.  It is white with some yellow streaks.  She is very tired.  She is having difficulty sleeping because of the cough.  She also has some runny stuffy nose and sore throat.  Recent exposure to strep.  No fever.  She has chest pain from all the coughing.  Decreased appetite.  No nausea vomiting or diarrhea.  She states that both her ears have been having some pressure and pain.  Hearing is normal.  Past Medical History:  Diagnosis Date  . Anemia   . Arthritis 02/2014   [place]  . Depression   . Dysmenorrhea 05/08/2018  . Panic attacks   . Suppurative hidradenitis     Patient Active Problem List   Diagnosis Date Noted  . Elevated blood pressure reading 05/08/2018  . Dysmenorrhea 05/08/2018  . Herpes 03/21/2014  . Suppurative hidradenitis   . Depression   . Pap smear for cervical cancer screening 08/09/2013  . PE (physical exam), annual 08/09/2013    Past Surgical History:  Procedure Laterality Date  . FOOT SURGERY    . PELVIC LAPAROSCOPY  1998/1999 `   pelvic adhesions and pain    OB History    Gravida  2   Para  1   Term      Preterm  1   AB  1   Living  0     SAB  1   TAB      Ectopic      Multiple      Live Births               Home Medications    Prior to Admission medications   Medication Sig Start Date End Date Taking? Authorizing Provider  albuterol (PROVENTIL HFA;VENTOLIN HFA) 108 (90 Base) MCG/ACT inhaler Inhale 2 puffs into the lungs every 6 (six) hours as needed for  wheezing or shortness of breath. 01/25/18   Argentina Donovan, PA-C  benzonatate (TESSALON) 100 MG capsule Take 1 capsule (100 mg total) by mouth every 8 (eight) hours. 06/01/18   Raylene Everts, MD  doxycycline (VIBRAMYCIN) 100 MG capsule Take 1 capsule (100 mg total) by mouth 2 (two) times daily. 06/01/18   Raylene Everts, MD  predniSONE (DELTASONE) 20 MG tablet Take 1 tablet (20 mg total) by mouth 2 (two) times daily with a meal. 06/01/18   Raylene Everts, MD    Family History Family History  Problem Relation Age of Onset  . Diabetes Mother   . Hypertension Mother   . Hyperlipidemia Mother   . Heart murmur Mother   . Hypertension Father   . Stroke Father   . Hyperlipidemia Father   . Hypertension Maternal Grandmother   . Diabetes Maternal Grandmother   . Osteoporosis Maternal Grandmother   . Thyroid disease Paternal Aunt     Social History Social History   Tobacco Use  . Smoking status:  Former Smoker  . Smokeless tobacco: Current User  . Tobacco comment: Weed  Substance Use Topics  . Alcohol use: Yes    Alcohol/week: 0.0 oz    Comment: Occassionally   . Drug use: No     Allergies   Codeine   Review of Systems Review of Systems  Constitutional: Positive for appetite change. Negative for chills and fever.  HENT: Positive for congestion, ear pain, postnasal drip and rhinorrhea. Negative for sore throat.   Eyes: Negative for pain and visual disturbance.  Respiratory: Positive for cough, shortness of breath and wheezing.   Cardiovascular: Negative for chest pain and palpitations.  Gastrointestinal: Negative for abdominal pain and vomiting.  Genitourinary: Negative for dysuria and hematuria.  Musculoskeletal: Negative for arthralgias and back pain.  Skin: Negative for color change and rash.  Neurological: Negative for seizures and syncope.  All other systems reviewed and are negative.    Physical Exam Triage Vital Signs ED Triage Vitals [06/01/18 1017]    Enc Vitals Group     BP 136/81     Pulse Rate (!) 107     Resp 18     Temp 98.6 F (37 C)     Temp src      SpO2 94 %     Weight      Height      Head Circumference      Peak Flow      Pain Score      Pain Loc      Pain Edu?      Excl. in [place]?    No data found.  Updated Vital Signs BP 136/81   Pulse (!) 107   Temp 98.6 F (37 C)   Resp 18   SpO2 94%   Visual Acuity Right Eye Distance:   Left Eye Distance:   Bilateral Distance:    Right Eye Near:   Left Eye Near:    Bilateral Near:     Physical Exam  Constitutional: She appears well-developed and well-nourished. She appears ill. No distress.  HENT:  Head: Normocephalic and atraumatic.  Right Ear: Hearing normal.  Left Ear: Hearing normal.  Mouth/Throat: Oropharynx is clear and moist and mucous membranes are normal. No posterior oropharyngeal edema or posterior oropharyngeal erythema. Tonsils are 0 on the right. Tonsils are 0 on the left.  Both ear canals blocked by cerumen  Eyes: Pupils are equal, round, and reactive to light. Conjunctivae are normal.  Neck: Normal range of motion.  Cardiovascular: Regular rhythm and normal heart sounds.  Tachycardia.  Regular  Pulmonary/Chest: Effort normal. No respiratory distress. She has wheezes.  Abdominal: Soft. She exhibits no distension.  Musculoskeletal: Normal range of motion. She exhibits no edema.  Lymphadenopathy:    She has no cervical adenopathy.  Neurological: She is alert.  Skin: Skin is warm and dry.     UC Treatments / Results  Labs (all labs ordered are listed, but only abnormal results are displayed) Labs Reviewed - No data to display  EKG None  Radiology No results found.  Procedures Procedures (including critical care time)  Medications Ordered in UC Medications  ipratropium-albuterol (DUONEB) 0.5-2.5 (3) MG/3ML nebulizer solution 3 mL (3 mLs Nebulization Given 06/01/18 1032)    Initial Impression / Assessment and Plan / UC Course  I  have reviewed the triage vital signs and the nursing notes.  Pertinent labs & imaging results that were available during my care of the patient were reviewed by me and considered  in my medical decision making (see chart for details).      After the DuoNeb treatment the patient states her cough is improved and breathing was better.  Her wheezing was diminished. Final Clinical Impressions(s) / UC Diagnoses   Final diagnoses:  Mild intermittent asthma with acute exacerbation  Viral upper respiratory tract infection     Discharge Instructions     Push fluids Rest Use a humidifier if you have Take Tessalon 2-3 times a day for cough Continue inhaler as needed Take both the antibiotic and the prednisone twice a day for 5 days Return if not better in a few days, or follow-up with PCP    ED Prescriptions    Medication Sig Dispense Auth. Provider   benzonatate (TESSALON) 100 MG capsule Take 1 capsule (100 mg total) by mouth every 8 (eight) hours. 21 capsule Raylene Everts, MD   predniSONE (DELTASONE) 20 MG tablet Take 1 tablet (20 mg total) by mouth 2 (two) times daily with a meal. 10 tablet Raylene Everts, MD   doxycycline (VIBRAMYCIN) 100 MG capsule Take 1 capsule (100 mg total) by mouth 2 (two) times daily. 10 capsule Raylene Everts, MD     Controlled Substance Prescriptions [place] Controlled Substance Registry consulted? Not Applicable   Raylene Everts, MD 06/01/18 [phone]

## 2018-06-01 NOTE — ED Triage Notes (Signed)
Pt presents with a cough, sore throat and earache x 1 week.

## 2018-06-12 ENCOUNTER — Ambulatory Visit: Payer: 59 | Admitting: Internal Medicine

## 2018-06-22 ENCOUNTER — Ambulatory Visit: Payer: 59 | Admitting: Allergy

## 2018-06-25 ENCOUNTER — Ambulatory Visit: Payer: 59 | Attending: Internal Medicine | Admitting: *Deleted

## 2018-06-25 VITALS — BP 108/72 | HR 76

## 2018-06-25 DIAGNOSIS — I10 Essential (primary) hypertension: Secondary | ICD-10-CM | POA: Diagnosis present

## 2018-06-25 DIAGNOSIS — R030 Elevated blood-pressure reading, without diagnosis of hypertension: Secondary | ICD-10-CM

## 2018-06-25 DIAGNOSIS — Z79899 Other long term (current) drug therapy: Secondary | ICD-10-CM | POA: Diagnosis not present

## 2018-06-25 DIAGNOSIS — Z7689 Persons encountering health services in other specified circumstances: Secondary | ICD-10-CM | POA: Insufficient documentation

## 2018-06-25 NOTE — Progress Notes (Signed)
Misty Bailey arrived to [facility] alert and oriented in good spirits. Last OV 05/08/18 with Dr.Johnson she was advised to return in 4-5 weeks for blood pressure check.  Pt denies chest pain, SOB, HA, dizziness, or blurred vision.  Verified medication with patient. Blood pressure reading:108/72. She states she has been taking measures to discussed at last OV: DASH diet and encouraged regular exercise at least 4 times a week or more  for 30 mins.  Pt has not other concerns today.

## 2018-06-26 ENCOUNTER — Encounter: Payer: Self-pay | Admitting: Internal Medicine

## 2018-06-26 DIAGNOSIS — N921 Excessive and frequent menstruation with irregular cycle: Secondary | ICD-10-CM

## 2018-06-26 DIAGNOSIS — N946 Dysmenorrhea, unspecified: Secondary | ICD-10-CM

## 2018-06-27 ENCOUNTER — Telehealth: Payer: Self-pay

## 2018-06-27 NOTE — Telephone Encounter (Signed)
Contacted [employer] to make sure I was doing the prior auth right because I got a message that the cpt code I put in for imaging was not pulling up. I spoke with Truddie Crumble D an intake rep and she informed me that pt doesn't need a prior auth.  Contacted pt to see what day and time was best for her to get the [place] done pt states early morning because she has to be work at [employer].   Contacted the scheduling department and the earliest they can do the [place] is at 1230pm.   Scheduled pt [place] for July 03, 2018 @1230pm  @[facility]. Pt will need to arrive @1215pm . Pt is to drink 32oz of water 1 hour prior and will need to have a full bladder.  Contacted pt with appointment information and directions. Pt is aware and doesn't ave any questions or concerns  Pt states she will email Dr. Wynetta Emery informing her of her old GYN doctor to get an referral sent to them

## 2018-07-02 ENCOUNTER — Encounter: Payer: Self-pay | Admitting: Internal Medicine

## 2018-07-03 ENCOUNTER — Ambulatory Visit (HOSPITAL_COMMUNITY)
Admission: RE | Admit: 2018-07-03 | Discharge: 2018-07-03 | Disposition: A | Discharge status: Home / Self Care | Payer: 59 | Source: Ambulatory Visit | Attending: Internal Medicine | Admitting: Internal Medicine

## 2018-07-03 DIAGNOSIS — N921 Excessive and frequent menstruation with irregular cycle: Secondary | ICD-10-CM | POA: Diagnosis present

## 2018-07-03 DIAGNOSIS — N946 Dysmenorrhea, unspecified: Secondary | ICD-10-CM | POA: Insufficient documentation

## 2018-07-13 ENCOUNTER — Ambulatory Visit: Payer: 59

## 2018-07-26 ENCOUNTER — Ambulatory Visit: Payer: 59 | Admitting: Allergy

## 2018-07-30 ENCOUNTER — Encounter: Payer: Self-pay | Admitting: Internal Medicine

## 2018-08-02 ENCOUNTER — Encounter: Payer: Self-pay | Admitting: Internal Medicine

## 2018-08-07 ENCOUNTER — Ambulatory Visit
Admission: RE | Admit: 2018-08-07 | Discharge: 2018-08-07 | Disposition: A | Discharge status: Home / Self Care | Payer: 59 | Source: Ambulatory Visit | Attending: Internal Medicine | Admitting: Internal Medicine

## 2018-08-07 DIAGNOSIS — Z1231 Encounter for screening mammogram for malignant neoplasm of breast: Secondary | ICD-10-CM

## 2019-06-24 ENCOUNTER — Ambulatory Visit (HOSPITAL_COMMUNITY)
Admission: EM | Admit: 2019-06-24 | Discharge: 2019-06-24 | Disposition: A | Discharge status: Home / Self Care | Payer: 59 | Attending: Family Medicine | Admitting: Family Medicine

## 2019-06-24 ENCOUNTER — Encounter (HOSPITAL_COMMUNITY): Payer: Self-pay

## 2019-06-24 ENCOUNTER — Other Ambulatory Visit: Payer: Self-pay

## 2019-06-24 DIAGNOSIS — L0231 Cutaneous abscess of buttock: Secondary | ICD-10-CM | POA: Insufficient documentation

## 2019-06-24 DIAGNOSIS — B373 Candidiasis of vulva and vagina: Secondary | ICD-10-CM

## 2019-06-24 DIAGNOSIS — B3731 Acute candidiasis of vulva and vagina: Secondary | ICD-10-CM

## 2019-06-24 MED ORDER — SULFAMETHOXAZOLE-TRIMETHOPRIM 800-160 MG PO TABS: 1.0000 | ORAL_TABLET | Freq: Two times a day (BID) | ORAL | 0 refills | Status: AC

## 2019-06-24 MED ORDER — IBUPROFEN 800 MG PO TABS: 800.0000 mg | ORAL_TABLET | Freq: Three times a day (TID) | ORAL | 0 refills | Status: DC

## 2019-06-24 MED ORDER — FLUCONAZOLE 150 MG PO TABS: 150.0000 mg | ORAL_TABLET | Freq: Every day | ORAL | 0 refills | Status: DC

## 2019-06-24 NOTE — ED Provider Notes (Signed)
Gregory    CSN: [id number] Arrival date & time: 06/24/19  0901      History   Chief Complaint Chief Complaint  Patient presents with  . Appointment    910    HPI Misty Bailey Clerk is a 48 y.o. female.   HPI Patient states she has recurring abscesses.  She has one on her buttocks.  She states is been growing for about a week.  Increasingly painful.  She is tried some warm soaks.  She is here to have it drained.  She also has had vaginal itching and vaginal infection for the last week.  She thinks is a yeast infection.  She also has had BV in the past.  She does not have any odor.  She would like STD testing. Abdominal pain.  No fever.  No dysuria or frequency. Past Medical History:  Diagnosis Date  . Anemia   . Arthritis 02/2014   Rib Cage  . Depression   . Dysmenorrhea 05/08/2018  . Panic attacks   . Suppurative hidradenitis     Patient Active Problem List   Diagnosis Date Noted  . Elevated blood pressure reading 05/08/2018  . Dysmenorrhea 05/08/2018  . Herpes 03/21/2014  . Suppurative hidradenitis   . Depression   . Pap smear for cervical cancer screening 08/09/2013  . PE (physical exam), annual 08/09/2013    Past Surgical History:  Procedure Laterality Date  . FOOT SURGERY    . PELVIC LAPAROSCOPY  1998/1999 `   pelvic adhesions and pain    OB History    Gravida  2   Para  1   Term      Preterm  1   AB  1   Living  0     SAB  1   TAB      Ectopic      Multiple      Live Births               Home Medications    Prior to Admission medications   Medication Sig Start Date End Date Taking? Authorizing Provider  albuterol (PROVENTIL HFA;VENTOLIN HFA) 108 (90 Base) MCG/ACT inhaler Inhale 2 puffs into the lungs every 6 (six) hours as needed for wheezing or shortness of breath. 01/25/18   Argentina Donovan, PA-C  fluconazole (DIFLUCAN) 150 MG tablet Take 1 tablet (150 mg total) by mouth daily. Repeat in 1 week if needed  06/24/19   Raylene Everts, MD  ibuprofen (ADVIL) 800 MG tablet Take 1 tablet (800 mg total) by mouth 3 (three) times daily. 06/24/19   Raylene Everts, MD  sulfamethoxazole-trimethoprim (BACTRIM DS) 800-160 MG tablet Take 1 tablet by mouth 2 (two) times daily for 7 days. 06/24/19 07/01/19  Raylene Everts, MD    Family History Family History  Problem Relation Age of Onset  . Diabetes Mother   . Hypertension Mother   . Hyperlipidemia Mother   . Heart murmur Mother   . Hypertension Father   . Stroke Father   . Hyperlipidemia Father   . Hypertension Maternal Grandmother   . Diabetes Maternal Grandmother   . Osteoporosis Maternal Grandmother   . Thyroid disease Paternal Aunt   . Breast cancer Neg Hx     Social History Social History   Tobacco Use  . Smoking status: Former Research scientist (life sciences)  . Smokeless tobacco: Current User  . Tobacco comment: Weed  Substance Use Topics  . Alcohol use: Yes  Alcohol/week: 0.0 standard drinks    Comment: Occassionally   . Drug use: No     Allergies   Codeine   Review of Systems Review of Systems  Constitutional: Negative for chills and fever.  HENT: Negative for ear pain and sore throat.   Eyes: Negative for pain and visual disturbance.  Respiratory: Negative for cough and shortness of breath.   Cardiovascular: Negative for chest pain and palpitations.  Gastrointestinal: Negative for abdominal pain and vomiting.  Genitourinary: Positive for vaginal discharge. Negative for dysuria and hematuria.  Musculoskeletal: Negative for arthralgias and back pain.  Skin: Positive for wound. Negative for color change and rash.  Neurological: Negative for seizures and syncope.  All other systems reviewed and are negative.    Physical Exam Triage Vital Signs ED Triage Vitals  Enc Vitals Group     BP 06/24/19 0925 (!) 152/88     Pulse Rate 06/24/19 0925 67     Resp 06/24/19 0925 19     Temp 06/24/19 0925 98.9 F (37.2 C)     Temp src --       SpO2 06/24/19 0925 100 %     Weight --      Height --      Head Circumference --      Peak Flow --      Pain Score 06/24/19 0922 8     Pain Loc --      Pain Edu? --      Excl. in [place]? --    No data found.  Updated Vital Signs BP (!) 152/88   Pulse 67   Temp 98.9 F (37.2 C)   Resp 19   LMP 06/03/2019   SpO2 100%       Physical Exam Constitutional:      General: She is not in acute distress.    Appearance: She is well-developed.  HENT:     Head: Normocephalic and atraumatic.  Eyes:     Conjunctiva/sclera: Conjunctivae normal.     Pupils: Pupils are equal, round, and reactive to light.  Neck:     Musculoskeletal: Normal range of motion.  Cardiovascular:     Rate and Rhythm: Normal rate.  Pulmonary:     Effort: Pulmonary effort is normal. No respiratory distress.  Abdominal:     General: There is no distension.     Palpations: Abdomen is soft.  Genitourinary:   Musculoskeletal: Normal range of motion.  Skin:    General: Skin is warm and dry.  Neurological:     Mental Status: She is alert.      UC Treatments / Results  Labs (all labs ordered are listed, but only abnormal results are displayed) Labs Reviewed  CERVICOVAGINAL ANCILLARY ONLY    EKG None  Radiology No results found.  Procedures Incision and Drainage  Date/Time: 06/24/2019 1:37 PM Performed by: Raylene Everts, MD Authorized by: Raylene Everts, MD   Consent:    Consent obtained:  Verbal   Consent given by:  Patient   Risks discussed:  Bleeding and incomplete drainage   Alternatives discussed:  No treatment Location:    Type:  Abscess   Size:  4   Location:  Lower extremity   Lower extremity location:  Buttock   Buttock location:  R buttock Pre-procedure details:    Skin preparation:  Antiseptic wash Anesthesia (see MAR for exact dosages):    Anesthesia method:  Local infiltration   Local anesthetic:  Lidocaine 1% WITH epi  Procedure type:    Complexity:  Simple  Procedure details:    Needle aspiration: no     Incision types:  Stab incision   Incision depth:  Subcutaneous   Scalpel blade:  11   Wound management:  Probed and deloculated and irrigated with saline   Drainage:  Bloody and purulent   Drainage amount:  Moderate   Wound treatment:  Wound left open and drain placed Post-procedure details:    Patient tolerance of procedure:  Tolerated well, no immediate complications   (including critical care time)  Medications Ordered in UC Medications - No data to display  Initial Impression / Assessment and Plan / UC Course  I have reviewed the triage vital signs and the nursing notes.  Pertinent labs & imaging results that were available during my care of the patient were reviewed by me and considered in my medical decision making (see chart for details).     Wound care discussed. Final Clinical Impressions(s) / UC Diagnoses   Final diagnoses:  Abscess of right buttock  Yeast infection of the vagina     Discharge Instructions     Take the Septra antibiotic 2 times a day Take ibuprofen as needed for pain.  I am unable to provide stronger pain medication because of your allergy to codeine Take the Diflucan 1 pill now, then 1 pill in 1 week at the end of your antibiotics We did lab testing during this visit.  If there are any abnormal findings that require change in medicine or indicate a positive result, you will be notified.  If all of your tests are normal, you will not be called.      ED Prescriptions    Medication Sig Dispense Auth. Provider   sulfamethoxazole-trimethoprim (BACTRIM DS) 800-160 MG tablet Take 1 tablet by mouth 2 (two) times daily for 7 days. 14 tablet Raylene Everts, MD   fluconazole (DIFLUCAN) 150 MG tablet Take 1 tablet (150 mg total) by mouth daily. Repeat in 1 week if needed 2 tablet Raylene Everts, MD   ibuprofen (ADVIL) 800 MG tablet Take 1 tablet (800 mg total) by mouth 3 (three) times daily. 21  tablet Raylene Everts, MD     Controlled Substance Prescriptions [place] Controlled Substance Registry consulted? Not Applicable   Raylene Everts, MD 06/24/19 [phone]

## 2019-06-24 NOTE — ED Triage Notes (Addendum)
Pt presents with vaginal boil on the right side that has been present x 1 week. Pt reports that it is very painful. Pt also reports vaginal irritation x 2 weeks.

## 2019-06-24 NOTE — Discharge Instructions (Signed)
Take the Septra antibiotic 2 times a day Take ibuprofen as needed for pain.  I am unable to provide stronger pain medication because of your allergy to codeine Take the Diflucan 1 pill now, then 1 pill in 1 week at the end of your antibiotics We did lab testing during this visit.  If there are any abnormal findings that require change in medicine or indicate a positive result, you will be notified.  If all of your tests are normal, you will not be called.

## 2019-06-25 LAB — CERVICOVAGINAL ANCILLARY ONLY
Bacterial vaginitis: POSITIVE — AB
Chlamydia: NEGATIVE
Neisseria Gonorrhea: NEGATIVE
Trichomonas: POSITIVE — AB

## 2019-06-26 ENCOUNTER — Other Ambulatory Visit: Payer: Self-pay

## 2019-06-26 ENCOUNTER — Telehealth (HOSPITAL_COMMUNITY): Payer: Self-pay | Admitting: Emergency Medicine

## 2019-06-26 DIAGNOSIS — Z9189 Other specified personal risk factors, not elsewhere classified: Secondary | ICD-10-CM

## 2019-06-26 MED ORDER — METRONIDAZOLE 500 MG PO TABS: 500.0000 mg | ORAL_TABLET | Freq: Two times a day (BID) | ORAL | 0 refills | Status: AC

## 2019-06-26 NOTE — Telephone Encounter (Signed)
Bacterial vaginosis is positive. This was not treated at the urgent care visit.  Flagyl 500 mg BID x 7 days #14 no refills sent to patients pharmacy of choice.    Trichomonas is positive. Rx  for Flagyl was sent to the pharmacy of record. Pt needs education to refrain from sexual intercourse for 7 days to give the medicine time to work. Sexual partners need to be notified and tested/treated. Condoms may reduce risk of reinfection. Recheck for further evaluation if symptoms are not improving.   Attempted to reach patient. No answer at this time. Voicemail left.

## 2019-06-26 NOTE — Addendum Note (Signed)
Addended by: Ocie Bob R on: 06/26/2019 11:12 AM   Modules accepted: Orders

## 2019-06-27 ENCOUNTER — Telehealth (HOSPITAL_COMMUNITY): Payer: Self-pay | Admitting: Emergency Medicine

## 2019-06-27 NOTE — Telephone Encounter (Signed)
Attempted to reach patient x2. No answer at this time. No voicemail set up.

## 2019-07-01 ENCOUNTER — Encounter (HOSPITAL_COMMUNITY): Payer: Self-pay

## 2019-07-01 ENCOUNTER — Telehealth (HOSPITAL_COMMUNITY): Payer: Self-pay | Admitting: Emergency Medicine

## 2019-07-01 MED ORDER — ONDANSETRON 4 MG PO TBDP: 4.0000 mg | ORAL_TABLET | Freq: Three times a day (TID) | ORAL | 0 refills | Status: DC | PRN

## 2019-07-01 NOTE — Telephone Encounter (Signed)
Pt requesting nausea medicine. Okay to send per Augusto Gamble, NP.

## 2019-07-01 NOTE — Telephone Encounter (Signed)
Attempted to reach patient x3. No answer at this time. Voicemail left. Letter sent    

## 2019-07-02 LAB — NOVEL CORONAVIRUS, NAA: SARS-CoV-2, NAA: NOT DETECTED

## 2019-07-02 LAB — SPECIMEN STATUS REPORT

## 2019-07-03 ENCOUNTER — Telehealth (HOSPITAL_COMMUNITY): Payer: Self-pay | Admitting: Emergency Medicine

## 2019-07-03 MED ORDER — ONDANSETRON 4 MG PO TBDP: 4.0000 mg | ORAL_TABLET | Freq: Three times a day (TID) | ORAL | 0 refills | Status: DC | PRN

## 2019-07-03 NOTE — Telephone Encounter (Signed)
Pharmacy change

## 2019-07-29 ENCOUNTER — Ambulatory Visit: Payer: Self-pay | Attending: Internal Medicine | Admitting: Internal Medicine

## 2019-07-29 ENCOUNTER — Encounter: Payer: Self-pay | Admitting: Internal Medicine

## 2019-07-29 ENCOUNTER — Other Ambulatory Visit: Payer: Self-pay

## 2019-07-29 VITALS — BP 151/83 | HR 63 | Temp 98.0°F | Resp 16 | Ht 61.0 in | Wt 136.0 lb

## 2019-07-29 DIAGNOSIS — R55 Syncope and collapse: Secondary | ICD-10-CM | POA: Insufficient documentation

## 2019-07-29 DIAGNOSIS — A5901 Trichomonal vulvovaginitis: Secondary | ICD-10-CM

## 2019-07-29 DIAGNOSIS — I10 Essential (primary) hypertension: Secondary | ICD-10-CM

## 2019-07-29 DIAGNOSIS — R001 Bradycardia, unspecified: Secondary | ICD-10-CM | POA: Insufficient documentation

## 2019-07-29 HISTORY — DX: Essential (primary) hypertension: I10

## 2019-07-29 MED ORDER — AMLODIPINE BESYLATE 5 MG PO TABS: 5.0000 mg | ORAL_TABLET | Freq: Every day | ORAL | 3 refills | Status: DC

## 2019-07-29 NOTE — Patient Instructions (Signed)
Start taking amlodipine 5 mg daily for blood pressure.  The goal is to be 130/80 or lower.  Try to limit salt in the foods.  Eat more fresh fruits and vegetables.  If you have any recurrent fainting episodes please follow-up. Syncope Syncope is when you pass out (faint) for a short time. It is caused by a sudden decrease in blood flow to the brain. Signs that you may be about to pass out include:  Feeling dizzy or light-headed.  Feeling sick to your stomach (nauseous).  Seeing all white or all black.  Having cold, clammy skin. If you pass out, get help right away. Call your local emergency services (911 in the [place] Do not drive yourself to the hospital. Follow these instructions at home: Watch for any changes in your symptoms. Take these actions to stay safe and help with your symptoms: Lifestyle  Do not drive, use machinery, or play sports until your doctor says it is okay.  Do not drink alcohol.  Do not use any products that contain nicotine or tobacco, such as cigarettes and e-cigarettes. If you need help quitting, ask your doctor.  Drink enough fluid to keep your pee (urine) pale yellow. General instructions  Take over-the-counter and prescription medicines only as told by your doctor.  If you are taking blood pressure or heart medicine, sit up and stand up slowly. Spend a few minutes getting ready to sit and then stand. This can help you feel less dizzy.  Have someone stay with you until you feel stable.  If you start to feel like you might pass out, lie down right away and raise (elevate) your feet above the level of your heart. Breathe deeply and steadily. Wait until all of the symptoms are gone.  Keep all follow-up visits as told by your doctor. This is important. Get help right away if:  You have a very bad headache.  You pass out once or more than once.  You have pain in your chest, belly, or back.  You have a very fast or uneven heartbeat (palpitations).  It  hurts to breathe.  You are bleeding from your mouth or your bottom (rectum).  You have black or tarry poop (stool).  You have jerky movements that you cannot control (seizure).  You are confused.  You have trouble walking.  You are very weak.  You have vision problems. These symptoms may be an emergency. Do not wait to see if the symptoms will go away. Get medical help right away. Call your local emergency services (911 in the [place] Do not drive yourself to the hospital. Summary  Syncope is when you pass out (faint) for a short time. It is caused by a sudden decrease in blood flow to the brain.  Signs that you may be about to faint include feeling dizzy, light-headed, or sick to your stomach, seeing all white or all black, or having cold, clammy skin.  If you start to feel like you might pass out, lie down right away and raise (elevate) your feet above the level of your heart. Breathe deeply and steadily. Wait until all of the symptoms are gone. This information is not intended to replace advice given to you by your health care provider. Make sure you discuss any questions you have with your health care provider. Document Released: 05/30/2008 Document Revised: 01/24/2018 Document Reviewed: 01/24/2018 Elsevier Patient Education  2020 [employer].

## 2019-07-29 NOTE — Progress Notes (Signed)
Patient ID: Leonela Kivi Pondexter, female    DOB: Sep 24, 1971  MRN: [id number]  CC: Follow-up   Subjective: Misty Bailey is a 48 y.o. female who presents for chronic ds management.  Last seen 04/2018. Her concerns today include:  Dysmenorrhea, divertticulosis, HL  C/o having fainting episode 07/14/2019 Was at her [place], it was hot, she began got feeling like she was about to have a panic attack - had a tingly feeling all over,felt dizzy.  She was walking back to her car along with her boyfriend when she had a syncopal episode.  "I just drop and hit the ground." Out for a few minutes.  Felt dizzy and disoriented when she came to Taken back to the house and her boyfriend and goddaughter started spraying a hose on her and putting ice on the forehead along with some cool rags.  She did not have any incontinence of urine or bowel.  Family did not report any seizure-like activity.  She felt tired for the next few days afterwards.  She was on Flagyl at the time for BV and trichomonas and not sure whether the medication played any role in the episode. Fainted in past when she was having blood drawn  No family history of sudden death especially while playing sports  BP has been elev the last several times she saw physician Had work physical 2 wks and SBP was 145. BP was elevated on last visit with me.  DASH diet was discussed and encouraged  Test pos for Trichomonas a few weeks ago. Both she ahd her partner were treated.  She is requesting to be rechecked today.  Patient Active Problem List   Diagnosis Date Noted  . Syncope 07/29/2019  . Essential hypertension 07/29/2019  . Elevated blood pressure reading 05/08/2018  . Dysmenorrhea 05/08/2018  . Herpes 03/21/2014  . Suppurative hidradenitis   . Depression   . Pap smear for cervical cancer screening 08/09/2013  . PE (physical exam), annual 08/09/2013     Current Outpatient Medications on File Prior to Visit  Medication Sig  Dispense Refill  . albuterol (PROVENTIL HFA;VENTOLIN HFA) 108 (90 Base) MCG/ACT inhaler Inhale 2 puffs into the lungs every 6 (six) hours as needed for wheezing or shortness of breath. 1 Inhaler 4  . fluconazole (DIFLUCAN) 150 MG tablet Take 1 tablet (150 mg total) by mouth daily. Repeat in 1 week if needed (Patient not taking: Reported on 07/29/2019) 2 tablet 0  . ibuprofen (ADVIL) 800 MG tablet Take 1 tablet (800 mg total) by mouth 3 (three) times daily. (Patient not taking: Reported on 07/29/2019) 21 tablet 0  . ondansetron (ZOFRAN ODT) 4 MG disintegrating tablet Take 1 tablet (4 mg total) by mouth every 8 (eight) hours as needed for nausea or vomiting. (Patient not taking: Reported on 07/29/2019) 10 tablet 0   No current facility-administered medications on file prior to visit.     Allergies  Allergen Reactions  . Codeine Shortness Of Breath    Social History   Socioeconomic History  . Marital status: Single    Spouse name: Not on file  . Number of children: 0  . Years of education: Associates  . Highest education level: Not on file  Occupational History  . Occupation: Animal nutritionist: [employer]  Social Needs  . Financial resource strain: Not on file  . Food insecurity    Worry: Not on file    Inability: Not on file  . Transportation needs  Medical: Not on file    Non-medical: Not on file  Tobacco Use  . Smoking status: Former Research scientist (life sciences)  . Smokeless tobacco: Current User  . Tobacco comment: Weed  Substance and Sexual Activity  . Alcohol use: Yes    Alcohol/week: 0.0 standard drinks    Comment: Occassionally   . Drug use: No  . Sexual activity: Not Currently    Partners: Male    Birth control/protection: None  Lifestyle  . Physical activity    Days per week: Not on file    Minutes per session: Not on file  . Stress: Not on file  Relationships  . Social Herbalist on phone: Not on file    Gets together: Not on file    Attends religious service:  Not on file    Active member of club or organization: Not on file    Attends meetings of clubs or organizations: Not on file    Relationship status: Not on file  . Intimate partner violence    Fear of current or ex partner: Not on file    Emotionally abused: Not on file    Physically abused: Not on file    Forced sexual activity: Not on file  Other Topics Concern  . Not on file  Social History Narrative   Boyfriend incarcerated 2014-2018    Family History  Problem Relation Age of Onset  . Diabetes Mother   . Hypertension Mother   . Hyperlipidemia Mother   . Heart murmur Mother   . Hypertension Father   . Stroke Father   . Hyperlipidemia Father   . Hypertension Maternal Grandmother   . Diabetes Maternal Grandmother   . Osteoporosis Maternal Grandmother   . Thyroid disease Paternal Aunt   . Breast cancer Neg Hx     Past Surgical History:  Procedure Laterality Date  . FOOT SURGERY    . PELVIC LAPAROSCOPY  1998/1999 `   pelvic adhesions and pain    ROS: Review of Systems Negative except as stated above  PHYSICAL EXAM: BP (!) 151/83   Pulse 63   Temp 98 F (36.7 C) (Oral)   Resp 16   Ht 5\' 1"  (1.549 m)   Wt 136 lb (61.7 kg)   LMP 07/27/2019   SpO2 99%   BMI 25.70 kg/m   Physical Exam  BP sitting 136/82 pulse 60; BP standing 133/85 pulse 59 General appearance - alert, well appearing, and in no distress Mental status - normal mood, behavior, speech, dress, motor activity, and thought processes Mouth - mucous membranes moist, pharynx normal without lesions Neck - supple, no significant adenopathy.  No carotid bruits Chest - clear to auscultation, no wheezes, rales or rhonchi, symmetric air entry Heart - normal rate, regular rhythm, normal S1, S2, no murmurs, rubs, clicks or gallops Neurological - cranial nerves II through XII intact, motor and sensory grossly normal bilaterally Extremities - peripheral pulses normal, no pedal edema, no clubbing or cyanosis   EKG revealed sinus bradycardia with heart rate of 53 otherwise normal EKG and unchanged when compared to previous EKG done in 2018 CMP Latest Ref Rng & Units 07/30/2017 03/20/2014 08/09/2013  Glucose 65 - 99 mg/dL 114(H) 83 90  BUN 6 - 20 mg/dL 6 10 12   Creatinine 0.44 - 1.00 mg/dL 1.07(H) 0.86 0.99  Sodium 135 - 145 mmol/L 139 140 137  Potassium 3.5 - 5.1 mmol/L 4.2 4.3 4.0  Chloride 101 - 111 mmol/L 108 108 104  CO2  22 - 32 mmol/L 24 26 28   Calcium 8.9 - 10.3 mg/dL 9.1 8.8 9.3  Total Protein 6.5 - 8.1 g/dL 7.3 6.3 6.5  Total Bilirubin 0.3 - 1.2 mg/dL 0.5 0.5 0.7  Alkaline Phos 38 - 126 U/L 84 61 67  AST 15 - 41 U/L 14(L) 13 12  ALT 14 - 54 U/L 10(L) 10 9   Lipid Panel     Component Value Date/Time   CHOL 244 (H) 11/07/2017 1019   TRIG 124 11/07/2017 1019   HDL 56 11/07/2017 1019   CHOLHDL 4.4 11/07/2017 1019   LDLCALC 163 (H) 11/07/2017 1019    CBC    Component Value Date/Time   WBC 5.6 01/25/2018 1106   WBC 6.3 07/30/2017 1308   RBC 4.64 01/25/2018 1106   RBC 4.41 07/30/2017 1308   HGB 13.4 01/25/2018 1106   HCT 42.4 01/25/2018 1106   PLT 279 01/25/2018 1106   MCV 91 01/25/2018 1106   MCH 28.9 01/25/2018 1106   MCH 28.6 07/30/2017 1308   MCHC 31.6 01/25/2018 1106   MCHC 32.3 07/30/2017 1308   RDW 13.9 01/25/2018 1106   LYMPHSABS 2.7 01/25/2018 1106   MONOABS 0.5 08/09/2013 1045   EOSABS 0.2 01/25/2018 1106   BASOSABS 0.0 01/25/2018 1106    ASSESSMENT AND PLAN: 1. Syncope, unspecified syncope type Sounds vasovagal. Encourage patient to stay hydrated and to avoid overly hot environments Orthostatic check today was negative.  EKG except for mild bradycardia was okay.  Auscultation of the heart was normal.  We will check some baseline blood test today.  Advised to follow-up if she has any recurrent episodes - CBC - Comprehensive metabolic panel - EKG [id number]  2. Trichomonas vaginitis - Cervicovaginal ancillary only  3. Essential hypertension DASH diet  discussed and encouraged.  We will start her on a low-dose of amlodipine. - Lipid panel - amLODipine (NORVASC) 5 MG tablet; Take 1 tablet (5 mg total) by mouth daily.  Dispense: 30 tablet; Refill: 3    Patient was given the opportunity to ask questions.  Patient verbalized understanding of the plan and was able to repeat key elements of the plan.   Orders Placed This Encounter  Procedures  . CBC  . Comprehensive metabolic panel  . Lipid panel  . EKG 12-Lead     Requested Prescriptions   Signed Prescriptions Disp Refills  . amLODipine (NORVASC) 5 MG tablet 30 tablet 3    Sig: Take 1 tablet (5 mg total) by mouth daily.    Return in about 8 weeks (around 09/23/2019).  Karle Plumber, MD, FACP

## 2019-07-30 ENCOUNTER — Other Ambulatory Visit: Payer: Self-pay | Admitting: Internal Medicine

## 2019-07-30 DIAGNOSIS — E785 Hyperlipidemia, unspecified: Secondary | ICD-10-CM

## 2019-07-30 LAB — CBC
Hematocrit: 36.6 % (ref 34.0–46.6)
Hemoglobin: 12 g/dL (ref 11.1–15.9)
MCH: 29.5 pg (ref 26.6–33.0)
MCHC: 32.8 g/dL (ref 31.5–35.7)
MCV: 90 fL (ref 79–97)
Platelets: 324 10*3/uL (ref 150–450)
RBC: 4.07 x10E6/uL (ref 3.77–5.28)
RDW: 12.6 % (ref 11.7–15.4)
WBC: 5.2 10*3/uL (ref 3.4–10.8)

## 2019-07-30 LAB — LIPID PANEL
Chol/HDL Ratio: 5 ratio — ABNORMAL HIGH (ref 0.0–4.4)
Cholesterol, Total: 257 mg/dL — ABNORMAL HIGH (ref 100–199)
HDL: 51 mg/dL (ref >39)
LDL Calculated: 182 mg/dL — ABNORMAL HIGH (ref 0–99)
Triglycerides: 121 mg/dL (ref 0–149)
VLDL Cholesterol Cal: 24 mg/dL (ref 5–40)

## 2019-07-30 LAB — COMPREHENSIVE METABOLIC PANEL
ALT: 8 IU/L (ref 0–32)
AST: 13 IU/L (ref 0–40)
Albumin/Globulin Ratio: 1.8 (ref 1.2–2.2)
Albumin: 4 g/dL (ref 3.8–4.8)
Alkaline Phosphatase: 66 IU/L (ref 39–117)
BUN/Creatinine Ratio: 17 (ref 9–23)
BUN: 17 mg/dL (ref 6–24)
Bilirubin Total: <0.2 mg/dL (ref 0.0–1.2)
CO2: 26 mmol/L (ref 20–29)
Calcium: 9.5 mg/dL (ref 8.7–10.2)
Chloride: 107 mmol/L — ABNORMAL HIGH (ref 96–106)
Creatinine, Ser: 1.02 mg/dL — ABNORMAL HIGH (ref 0.57–1.00)
GFR calc Af Amer: 75 mL/min/{1.73_m2} (ref >59)
GFR calc non Af Amer: 65 mL/min/{1.73_m2} (ref >59)
Globulin, Total: 2.2 g/dL (ref 1.5–4.5)
Glucose: 82 mg/dL (ref 65–99)
Potassium: 4.7 mmol/L (ref 3.5–5.2)
Sodium: 145 mmol/L — ABNORMAL HIGH (ref 134–144)
Total Protein: 6.2 g/dL (ref 6.0–8.5)

## 2019-07-30 MED ORDER — ATORVASTATIN CALCIUM 10 MG PO TABS: 10.0000 mg | ORAL_TABLET | Freq: Every day | ORAL | 3 refills | Status: DC

## 2019-08-01 LAB — CERVICOVAGINAL ANCILLARY ONLY: Trichomonas: NEGATIVE

## 2019-08-27 ENCOUNTER — Ambulatory Visit (HOSPITAL_COMMUNITY)
Admission: EM | Admit: 2019-08-27 | Discharge: 2019-08-27 | Disposition: A | Discharge status: Home / Self Care | Payer: BC Managed Care – PPO | Attending: Family Medicine | Admitting: Family Medicine

## 2019-08-27 ENCOUNTER — Other Ambulatory Visit: Payer: Self-pay

## 2019-08-27 ENCOUNTER — Encounter (HOSPITAL_COMMUNITY): Payer: Self-pay

## 2019-08-27 DIAGNOSIS — Z202 Contact with and (suspected) exposure to infections with a predominantly sexual mode of transmission: Secondary | ICD-10-CM | POA: Diagnosis present

## 2019-08-27 DIAGNOSIS — N898 Other specified noninflammatory disorders of vagina: Secondary | ICD-10-CM | POA: Insufficient documentation

## 2019-08-27 DIAGNOSIS — B372 Candidiasis of skin and nail: Secondary | ICD-10-CM | POA: Insufficient documentation

## 2019-08-27 DIAGNOSIS — R030 Elevated blood-pressure reading, without diagnosis of hypertension: Secondary | ICD-10-CM | POA: Insufficient documentation

## 2019-08-27 MED ORDER — FLUCONAZOLE 200 MG PO TABS: 200.0000 mg | ORAL_TABLET | ORAL | 0 refills | Status: AC

## 2019-08-27 NOTE — ED Triage Notes (Signed)
Pt presents with vaginal itching; pt states she was recently treated for a yeast infection and symptoms partially resolved.  Pt also presents with chronic rash on both underarm areas.

## 2019-08-27 NOTE — Discharge Instructions (Addendum)
Take the prescribed Diflucan once a week for 4 weeks.    Your STD tests are pending.  Do not have sex until the test results come back.  If your test results are positive, we will call you.  You may need additional treatment and your partner may also need treatment.      Your blood pressure is elevated today at  166/82.  Please have this rechecked by your primary care provider in 2 weeks.

## 2019-08-27 NOTE — ED Provider Notes (Signed)
[place]    CSN: [id number] Arrival date & time: 08/27/19  [id number]      History   Chief Complaint Chief Complaint  Patient presents with  . Appointment  . (9:30  Rash , Vaginitis)    HPI Misty Bailey is a 48 y.o. female.   Patient presents with a pruritic rash in bilateral axilla x1 year.  She states she treated this with over-the-counter antibiotic cream and over-the-counter eczema cream without relief.  She also reports vaginal itching and states she has a "yeast infection"; she also reports white-tan vaginal discharge.  She is sexually active without condom use; and has a history of bacterial vaginosis and trichomonas in June 2020.  She requests STD testing today but not treatment today; she states she will take treatment if tests come back positive.  She also notes that she has not been taking her blood pressure medication but plans to restart these today as directed by her PCP.  LMP: 1 week.   The history is provided by the patient.    Past Medical History:  Diagnosis Date  . Anemia   . Arthritis 02/2014   Rib Cage  . Depression   . Dysmenorrhea 05/08/2018  . Essential hypertension 07/29/2019  . Panic attacks   . Suppurative hidradenitis     Patient Active Problem List   Diagnosis Date Noted  . Hyperlipidemia 07/30/2019  . Syncope 07/29/2019  . Essential hypertension 07/29/2019  . Elevated blood pressure reading 05/08/2018  . Dysmenorrhea 05/08/2018  . Herpes 03/21/2014  . Suppurative hidradenitis   . Depression   . Pap smear for cervical cancer screening 08/09/2013  . PE (physical exam), annual 08/09/2013    Past Surgical History:  Procedure Laterality Date  . FOOT SURGERY    . PELVIC LAPAROSCOPY  1998/1999 `   pelvic adhesions and pain    OB History    Gravida  2   Para  1   Term      Preterm  1   AB  1   Living  0     SAB  1   TAB      Ectopic      Multiple      Live Births               Home Medications     Prior to Admission medications   Medication Sig Start Date End Date Taking? Authorizing Provider  albuterol (PROVENTIL HFA;VENTOLIN HFA) 108 (90 Base) MCG/ACT inhaler Inhale 2 puffs into the lungs every 6 (six) hours as needed for wheezing or shortness of breath. 01/25/18   Argentina Donovan, PA-C  amLODipine (NORVASC) 5 MG tablet Take 1 tablet (5 mg total) by mouth daily. 07/29/19   Ladell Pier, MD  atorvastatin (LIPITOR) 10 MG tablet Take 1 tablet (10 mg total) by mouth daily. 07/30/19   Ladell Pier, MD  fluconazole (DIFLUCAN) 200 MG tablet Take 1 tablet (200 mg total) by mouth once a week for 4 doses. Take 1 tablet once a week for 4 weeks. 08/27/19 09/18/19  Sharion Balloon, NP  ibuprofen (ADVIL) 800 MG tablet Take 1 tablet (800 mg total) by mouth 3 (three) times daily. Patient not taking: Reported on 07/29/2019 06/24/19   Raylene Everts, MD  ondansetron (ZOFRAN ODT) 4 MG disintegrating tablet Take 1 tablet (4 mg total) by mouth every 8 (eight) hours as needed for nausea or vomiting. Patient not taking: Reported on 07/29/2019 07/03/19  Raylene Everts, MD    Family History Family History  Problem Relation Age of Onset  . Diabetes Mother   . Hypertension Mother   . Hyperlipidemia Mother   . Heart murmur Mother   . Hypertension Father   . Stroke Father   . Hyperlipidemia Father   . Hypertension Maternal Grandmother   . Diabetes Maternal Grandmother   . Osteoporosis Maternal Grandmother   . Thyroid disease Paternal Aunt   . Breast cancer Neg Hx     Social History Social History   Tobacco Use  . Smoking status: Former Research scientist (life sciences)  . Smokeless tobacco: Current User  . Tobacco comment: Weed  Substance Use Topics  . Alcohol use: Yes    Alcohol/week: 0.0 standard drinks    Comment: Occassionally   . Drug use: No     Allergies   Codeine   Review of Systems Review of Systems  Constitutional: Negative for chills and fever.  HENT: Negative for ear pain and sore throat.    Eyes: Negative for pain and visual disturbance.  Respiratory: Negative for cough and shortness of breath.   Cardiovascular: Negative for chest pain and palpitations.  Gastrointestinal: Negative for abdominal pain, diarrhea, nausea and vomiting.  Genitourinary: Positive for vaginal discharge. Negative for dysuria, flank pain, hematuria and pelvic pain.  Musculoskeletal: Negative for arthralgias and back pain.  Skin: Positive for rash. Negative for color change.  Neurological: Negative for seizures and syncope.  All other systems reviewed and are negative.    Physical Exam Triage Vital Signs ED Triage Vitals  Enc Vitals Group     BP 08/27/19 0941 (!) 166/82     Pulse Rate 08/27/19 0941 62     Resp 08/27/19 0941 18     Temp 08/27/19 0941 98.3 F (36.8 C)     Temp Source 08/27/19 0941 Oral     SpO2 08/27/19 0941 99 %     Weight --      Height --      Head Circumference --      Peak Flow --      Pain Score 08/27/19 0943 7     Pain Loc --      Pain Edu? --      Excl. in [place]? --    No data found.  Updated Vital Signs BP (!) 166/82 (BP Location: Right Arm)   Pulse 62   Temp 98.3 F (36.8 C) (Oral)   Resp 18   LMP 08/22/2019   SpO2 99%   Visual Acuity Right Eye Distance:   Left Eye Distance:   Bilateral Distance:    Right Eye Near:   Left Eye Near:    Bilateral Near:     Physical Exam Vitals signs and nursing note reviewed.  Constitutional:      General: She is not in acute distress.    Appearance: She is well-developed.  HENT:     Head: Normocephalic and atraumatic.     Mouth/Throat:     Mouth: Mucous membranes are moist.     Pharynx: Oropharynx is clear.  Eyes:     Conjunctiva/sclera: Conjunctivae normal.  Neck:     Musculoskeletal: Neck supple.  Cardiovascular:     Rate and Rhythm: Normal rate and regular rhythm.     Heart sounds: No murmur.  Pulmonary:     Effort: Pulmonary effort is normal. No respiratory distress.     Breath sounds: Normal breath  sounds.  Abdominal:     General: Bowel  sounds are normal.     Palpations: Abdomen is soft.     Tenderness: There is no abdominal tenderness. There is no right CVA tenderness, left CVA tenderness, guarding or rebound.  Skin:    General: Skin is warm and dry.     Findings: Rash present.     Comments: Erythematous patches in bilateral axilla; R>L.    Neurological:     General: No focal deficit present.     Mental Status: She is alert and oriented to person, place, and time.      UC Treatments / Results  Labs (all labs ordered are listed, but only abnormal results are displayed) Labs Reviewed  CERVICOVAGINAL ANCILLARY ONLY    EKG   Radiology No results found.  Procedures Procedures (including critical care time)  Medications Ordered in UC Medications - No data to display  Initial Impression / Assessment and Plan / UC Course  I have reviewed the triage vital signs and the nursing notes.  Pertinent labs & imaging results that were available during my care of the patient were reviewed by me and considered in my medical decision making (see chart for details).     Candidal dermatitis, vaginal discharge, potential exposure to STD, elevated blood pressure with known hypertension.  Treating with Diflucan once a week for 4 weeks.  STD testing done today; patient declines treatment at this time but agrees to return for treatment if needed.  Discussed that she should abstain from sexual activity until her test results are back and that she and her partner may need treatment at that time.  Discussed with patient that her blood pressure is elevated today and that she needs to have this checked by her PCP in 2 weeks.  Patient agrees to this treatment plan.     Final Clinical Impressions(s) / UC Diagnoses   Final diagnoses:  Candidal dermatitis  Vaginal discharge  Potential exposure to STD  Elevated blood-pressure reading without diagnosis of hypertension     Discharge  Instructions     Take the prescribed Diflucan once a week for 4 weeks.    Your STD tests are pending.  Do not have sex until the test results come back.  If your test results are positive, we will call you.  You may need additional treatment and your partner may also need treatment.      Your blood pressure is elevated today at  166/82.  Please have this rechecked by your primary care provider in 2 weeks.         ED Prescriptions    Medication Sig Dispense Auth. Provider   fluconazole (DIFLUCAN) 200 MG tablet Take 1 tablet (200 mg total) by mouth once a week for 4 doses. Take 1 tablet once a week for 4 weeks. 4 tablet Sharion Balloon, NP     Controlled Substance Prescriptions [place] Controlled Substance Registry consulted? Not Applicable   Sharion Balloon, NP 08/27/19 1108

## 2019-08-29 LAB — CERVICOVAGINAL ANCILLARY ONLY
Bacterial vaginitis: NEGATIVE
Candida vaginitis: POSITIVE — AB
Chlamydia: NEGATIVE
Neisseria Gonorrhea: NEGATIVE
Trichomonas: NEGATIVE

## 2019-09-02 ENCOUNTER — Other Ambulatory Visit: Payer: Self-pay

## 2019-09-02 ENCOUNTER — Ambulatory Visit (HOSPITAL_COMMUNITY)
Admission: EM | Admit: 2019-09-02 | Discharge: 2019-09-02 | Disposition: A | Discharge status: Home / Self Care | Payer: BC Managed Care – PPO | Attending: Family Medicine | Admitting: Family Medicine

## 2019-09-02 ENCOUNTER — Encounter (HOSPITAL_COMMUNITY): Payer: Self-pay

## 2019-09-02 DIAGNOSIS — L732 Hidradenitis suppurativa: Secondary | ICD-10-CM

## 2019-09-02 MED ORDER — TRAMADOL HCL 50 MG PO TABS: 50.0000 mg | ORAL_TABLET | Freq: Four times a day (QID) | ORAL | 0 refills | Status: DC | PRN

## 2019-09-02 MED ORDER — DOXYCYCLINE HYCLATE 100 MG PO CAPS: 100.0000 mg | ORAL_CAPSULE | Freq: Two times a day (BID) | ORAL | 0 refills | Status: DC

## 2019-09-02 NOTE — Discharge Instructions (Signed)
Treating you for hidradenitis flare Take the doxycycline for infection Tramadol for pain Warm compresses  Follow up as needed for continued or worsening symptoms

## 2019-09-02 NOTE — ED Triage Notes (Signed)
Pt present today with abscess she wants lance, on the right armpit, swollen, pain. Pt also have rash bilateral armpit.

## 2019-09-03 NOTE — ED Provider Notes (Signed)
[place]    CSN: [id number] Arrival date & time: 09/02/19  [id number]      History   Chief Complaint Chief Complaint  Patient presents with  . Appointment    8.30 am  . Rash  . Abscess    HPI Misty Bailey is a 48 y.o. female.   Patient is a 48 year old female with past medical history of anemia, arthritis, depression, hypertension, superlative hidradenitis.  She presents today with worsening abscesses to right axilla area.  No drainage.  Reporting very swollen and painful.  History of same in  the groin area.  Also has rash to left axilla which is currently being treated with fluconazole.  Reporting the rash in the left axilla very itchy.  Denies any fever, chills or body aches.  She has been taking ibuprofen for the pain without any relief.  ROS per HPI      Past Medical History:  Diagnosis Date  . Anemia   . Arthritis 02/2014   Rib Cage  . Depression   . Dysmenorrhea 05/08/2018  . Essential hypertension 07/29/2019  . Panic attacks   . Suppurative hidradenitis     Patient Active Problem List   Diagnosis Date Noted  . Hyperlipidemia 07/30/2019  . Syncope 07/29/2019  . Essential hypertension 07/29/2019  . Elevated blood pressure reading 05/08/2018  . Dysmenorrhea 05/08/2018  . Herpes 03/21/2014  . Suppurative hidradenitis   . Depression   . Pap smear for cervical cancer screening 08/09/2013  . PE (physical exam), annual 08/09/2013    Past Surgical History:  Procedure Laterality Date  . FOOT SURGERY    . PELVIC LAPAROSCOPY  1998/1999 `   pelvic adhesions and pain    OB History    Gravida  2   Para  1   Term      Preterm  1   AB  1   Living  0     SAB  1   TAB      Ectopic      Multiple      Live Births               Home Medications    Prior to Admission medications   Medication Sig Start Date End Date Taking? Authorizing Provider  albuterol (PROVENTIL HFA;VENTOLIN HFA) 108 (90 Base) MCG/ACT inhaler Inhale 2 puffs  into the lungs every 6 (six) hours as needed for wheezing or shortness of breath. 01/25/18   Argentina Donovan, PA-C  amLODipine (NORVASC) 5 MG tablet Take 1 tablet (5 mg total) by mouth daily. 07/29/19   Ladell Pier, MD  atorvastatin (LIPITOR) 10 MG tablet Take 1 tablet (10 mg total) by mouth daily. 07/30/19   Ladell Pier, MD  doxycycline (VIBRAMYCIN) 100 MG capsule Take 1 capsule (100 mg total) by mouth 2 (two) times daily. 09/02/19   Loura Halt A, NP  fluconazole (DIFLUCAN) 200 MG tablet Take 1 tablet (200 mg total) by mouth once a week for 4 doses. Take 1 tablet once a week for 4 weeks. 08/27/19 09/18/19  Sharion Balloon, NP  traMADol (ULTRAM) 50 MG tablet Take 1 tablet (50 mg total) by mouth every 6 (six) hours as needed. 09/02/19   Orvan July, NP    Family History Family History  Problem Relation Age of Onset  . Diabetes Mother   . Hypertension Mother   . Hyperlipidemia Mother   . Heart murmur Mother   . Hypertension Father   .  Stroke Father   . Hyperlipidemia Father   . Hypertension Maternal Grandmother   . Diabetes Maternal Grandmother   . Osteoporosis Maternal Grandmother   . Thyroid disease Paternal Aunt   . Breast cancer Neg Hx     Social History Social History   Tobacco Use  . Smoking status: Former Research scientist (life sciences)  . Smokeless tobacco: Current User  . Tobacco comment: Weed  Substance Use Topics  . Alcohol use: Yes    Alcohol/week: 0.0 standard drinks    Comment: Occassionally   . Drug use: No     Allergies   Patient has no known allergies.   Review of Systems Review of Systems   Physical Exam Triage Vital Signs ED Triage Vitals [09/02/19 0851]  Enc Vitals Group     BP 140/87     Pulse Rate 75     Resp 16     Temp 98.5 F (36.9 C)     Temp Source Oral     SpO2 99 %     Weight      Height      Head Circumference      Peak Flow      Pain Score 10     Pain Loc      Pain Edu?      Excl. in [place]?    No data found.  Updated Vital Signs BP 140/87  (BP Location: Right Arm)   Pulse 75   Temp 98.5 F (36.9 C) (Oral)   Resp 16   LMP 08/22/2019   SpO2 99%   Visual Acuity Right Eye Distance:   Left Eye Distance:   Bilateral Distance:    Right Eye Near:   Left Eye Near:    Bilateral Near:     Physical Exam Constitutional:      General: She is not in acute distress.    Appearance: Normal appearance. She is not ill-appearing, toxic-appearing or diaphoretic.     Comments: Appears in pain   HENT:     Head: Normocephalic and atraumatic.     Nose: Nose normal.  Eyes:     Conjunctiva/sclera: Conjunctivae normal.  Neck:     Musculoskeletal: Normal range of motion.  Pulmonary:     Effort: Pulmonary effort is normal.  Chest:       Comments: Multiple nodular abscesses to right axilla as many about half centimeter each.  No fluctuance.  Very tender to palpation. Musculoskeletal: Normal range of motion.  Skin:    General: Skin is warm and dry.  Neurological:     Mental Status: She is alert.  Psychiatric:        Mood and Affect: Mood normal.      UC Treatments / Results  Labs (all labs ordered are listed, but only abnormal results are displayed) Labs Reviewed - No data to display  EKG   Radiology No results found.  Procedures Procedures (including critical care time)  Medications Ordered in UC Medications - No data to display  Initial Impression / Assessment and Plan / UC Course  I have reviewed the triage vital signs and the nursing notes.  Pertinent labs & imaging results that were available during my care of the patient were reviewed by me and considered in my medical decision making (see chart for details).     Patient is a 41-year female past medical history of hidradenitis suppurative Treating the abscesses today with doxycycline. Tramadol for severe pain Warm compresses Recommended follow-up with a surgeon for any  continued or worsening problems Final Clinical Impressions(s) / UC Diagnoses    Final diagnoses:  Hidradenitis suppurativa of right axilla     Discharge Instructions     Treating you for hidradenitis flare Take the doxycycline for infection Tramadol for pain Warm compresses  Follow up as needed for continued or worsening symptoms     ED Prescriptions    Medication Sig Dispense Auth. Provider   doxycycline (VIBRAMYCIN) 100 MG capsule Take 1 capsule (100 mg total) by mouth 2 (two) times daily. 20 capsule Roman Sandall A, NP   traMADol (ULTRAM) 50 MG tablet Take 1 tablet (50 mg total) by mouth every 6 (six) hours as needed. 15 tablet Loura Halt A, NP     Controlled Substance Prescriptions [place] Controlled Substance Registry consulted? Not Applicable   Orvan July, NP 09/03/19 1013

## 2019-09-08 ENCOUNTER — Other Ambulatory Visit: Payer: Self-pay

## 2019-09-08 ENCOUNTER — Ambulatory Visit (HOSPITAL_COMMUNITY)
Admission: EM | Admit: 2019-09-08 | Discharge: 2019-09-08 | Disposition: A | Discharge status: Home / Self Care | Payer: BC Managed Care – PPO | Attending: Family Medicine | Admitting: Family Medicine

## 2019-09-08 ENCOUNTER — Encounter (HOSPITAL_COMMUNITY): Payer: Self-pay

## 2019-09-08 DIAGNOSIS — L732 Hidradenitis suppurativa: Secondary | ICD-10-CM

## 2019-09-08 MED ORDER — METHYLPREDNISOLONE 4 MG PO TBPK: ORAL_TABLET | ORAL | 0 refills | Status: DC

## 2019-09-08 NOTE — ED Provider Notes (Signed)
[place]    CSN: [id number] Arrival date & time: 09/08/19  1147      History   Chief Complaint Chief Complaint  Patient presents with  . Rash    HPI Misty Bailey is a 48 y.o. female.   HPI  Patient has a chronic medical condition - hidradenitis suppurativa and it appears to run in several members of her family.  She has had multiple visits here for exacerbations of her rash, but is not under ongoing care through her PCP or a dermatologist.  Is currently taking doxycycline and diflucan and tramadol and is here in tears because of the pain and burning.  Past Medical History:  Diagnosis Date  . Anemia   . Arthritis 02/2014   Rib Cage  . Depression   . Dysmenorrhea 05/08/2018  . Essential hypertension 07/29/2019  . Panic attacks   . Suppurative hidradenitis     Patient Active Problem List   Diagnosis Date Noted  . Hyperlipidemia 07/30/2019  . Syncope 07/29/2019  . Essential hypertension 07/29/2019  . Elevated blood pressure reading 05/08/2018  . Dysmenorrhea 05/08/2018  . Herpes 03/21/2014  . Suppurative hidradenitis   . Depression   . Pap smear for cervical cancer screening 08/09/2013  . PE (physical exam), annual 08/09/2013    Past Surgical History:  Procedure Laterality Date  . FOOT SURGERY    . PELVIC LAPAROSCOPY  1998/1999 `   pelvic adhesions and pain    OB History    Gravida  2   Para  1   Term      Preterm  1   AB  1   Living  0     SAB  1   TAB      Ectopic      Multiple      Live Births               Home Medications    Prior to Admission medications   Medication Sig Start Date End Date Taking? Authorizing Provider  doxycycline (VIBRAMYCIN) 100 MG capsule Take 1 capsule (100 mg total) by mouth 2 (two) times daily. 09/02/19  Yes Bast, Traci A, NP  fluconazole (DIFLUCAN) 200 MG tablet Take 1 tablet (200 mg total) by mouth once a week for 4 doses. Take 1 tablet once a week for 4 weeks. 08/27/19 09/18/19 Yes Sharion Balloon, NP  albuterol (PROVENTIL HFA;VENTOLIN HFA) 108 (90 Base) MCG/ACT inhaler Inhale 2 puffs into the lungs every 6 (six) hours as needed for wheezing or shortness of breath. 01/25/18   Argentina Donovan, PA-C  amLODipine (NORVASC) 5 MG tablet Take 1 tablet (5 mg total) by mouth daily. 07/29/19   Ladell Pier, MD  atorvastatin (LIPITOR) 10 MG tablet Take 1 tablet (10 mg total) by mouth daily. 07/30/19   Ladell Pier, MD  methylPREDNISolone (MEDROL DOSEPAK) 4 MG TBPK tablet TAD 09/08/19   Raylene Everts, MD  traMADol (ULTRAM) 50 MG tablet Take 1 tablet (50 mg total) by mouth every 6 (six) hours as needed. 09/02/19   Orvan July, NP    Family History Family History  Problem Relation Age of Onset  . Diabetes Mother   . Hypertension Mother   . Hyperlipidemia Mother   . Heart murmur Mother   . Hypertension Father   . Stroke Father   . Hyperlipidemia Father   . Hypertension Maternal Grandmother   . Diabetes Maternal Grandmother   . Osteoporosis Maternal Grandmother   .  Thyroid disease Paternal Aunt   . Breast cancer Neg Hx     Social History Social History   Tobacco Use  . Smoking status: Former Research scientist (life sciences)  . Smokeless tobacco: Current User  . Tobacco comment: Weed  Substance Use Topics  . Alcohol use: Yes    Alcohol/week: 0.0 standard drinks    Comment: Occassionally   . Drug use: No     Allergies   Patient has no known allergies.   Review of Systems Review of Systems  Constitutional: Negative for chills and fever.  HENT: Negative for ear pain and sore throat.   Eyes: Negative for pain and visual disturbance.  Respiratory: Negative for cough and shortness of breath.   Cardiovascular: Negative for chest pain and palpitations.  Gastrointestinal: Negative for abdominal pain and vomiting.  Genitourinary: Negative for dysuria and hematuria.  Musculoskeletal: Negative for arthralgias and back pain.  Skin: Positive for rash. Negative for color change.   Neurological: Negative for seizures and syncope.  All other systems reviewed and are negative.    Physical Exam Triage Vital Signs ED Triage Vitals  Enc Vitals Group     BP 09/08/19 1205 129/84     Pulse Rate 09/08/19 1205 87     Resp 09/08/19 1205 16     Temp 09/08/19 1205 98.2 F (36.8 C)     Temp Source 09/08/19 1205 Temporal     SpO2 09/08/19 1205 100 %     Weight --      Height --      Head Circumference --      Peak Flow --      Pain Score 09/08/19 1204 9     Pain Loc --      Pain Edu? --      Excl. in [place]? --    No data found.  Updated Vital Signs BP 129/84 (BP Location: Right Arm)   Pulse 87   Temp 98.2 F (36.8 C) (Temporal)   Resp 16   LMP 08/22/2019   SpO2 100%   Visual Acuity Right Eye Distance:   Left Eye Distance:   Bilateral Distance:    Right Eye Near:   Left Eye Near:    Bilateral Near:     Physical Exam Constitutional:      General: She is not in acute distress.    Appearance: She is well-developed.  HENT:     Head: Normocephalic and atraumatic.  Eyes:     Conjunctiva/sclera: Conjunctivae normal.     Pupils: Pupils are equal, round, and reactive to light.  Neck:     Musculoskeletal: Normal range of motion.  Cardiovascular:     Rate and Rhythm: Normal rate.  Pulmonary:     Effort: Pulmonary effort is normal. No respiratory distress.  Abdominal:     General: There is no distension.     Palpations: Abdomen is soft.  Musculoskeletal: Normal range of motion.  Skin:    General: Skin is warm and dry.     Comments: Both axilla streaked with thickened inflamed tissue with multiple small infections.  No large abscess.  I did anesthestized one of the larger areas and aspirated with no success.  Neurological:     Mental Status: She is alert.      UC Treatments / Results  Labs (all labs ordered are listed, but only abnormal results are displayed) Labs Reviewed - No data to display  EKG   Radiology No results found.  Procedures  Procedures (including critical  care time)  Medications Ordered in UC Medications - No data to display  Initial Impression / Assessment and Plan / UC Course  I have reviewed the triage vital signs and the nursing notes.  Pertinent labs & imaging results that were available during my care of the patient were reviewed by me and considered in my medical decision making (see chart for details).     I discussed with the patient that this is a chronic medical condition.  Needs to be managed on an ongoing basis by her primary care doctor or a dermatologist.  Going to add steroids just to take down the inflammation, she is already on antibiotics and antifungals.  Recommend warm compresses.  Gentle cleansing.  I did give her a written patient information gotten from up-to-date Final Clinical Impressions(s) / UC Diagnoses   Final diagnoses:  Suppurative hidradenitis     Discharge Instructions     Elizebeth Koller the antibiotics that were prescribed on 09/06/2019 Take tramadol as needed for pain Discontinue the antifungal cream Take the Medrol pack as prescribed.  Take all of day 1 today When you follow-up with your primary care doctor as you need to discuss referral to dermatology   ED Prescriptions    Medication Sig Dispense Auth. Provider   methylPREDNISolone (MEDROL DOSEPAK) 4 MG TBPK tablet TAD 21 tablet Raylene Everts, MD     Controlled Substance Prescriptions [place] Controlled Substance Registry consulted? Not Applicable   Raylene Everts, MD 09/08/19 1254

## 2019-09-08 NOTE — Discharge Instructions (Signed)
Finish the antibiotics that were prescribed on 09/06/2019 Take tramadol as needed for pain Discontinue the antifungal cream Take the Medrol pack as prescribed.  Take all of day 1 today When you follow-up with your primary care doctor as you need to discuss referral to dermatology

## 2019-09-08 NOTE — ED Triage Notes (Signed)
Patient presents to Urgent Care with complaints of painful and itchy rash under bilateral arms since about a year ago. Patient reports the rash has progressively gotten worse over the past 2-3 weeks, has been seen recently for same.

## 2019-10-15 ENCOUNTER — Ambulatory Visit: Payer: Self-pay | Admitting: Internal Medicine

## 2019-10-25 ENCOUNTER — Ambulatory Visit (HOSPITAL_COMMUNITY)
Admission: EM | Admit: 2019-10-25 | Discharge: 2019-10-25 | Disposition: A | Discharge status: Home / Self Care | Payer: BC Managed Care – PPO | Attending: Family Medicine | Admitting: Family Medicine

## 2019-10-25 ENCOUNTER — Encounter (HOSPITAL_COMMUNITY): Payer: Self-pay

## 2019-10-25 ENCOUNTER — Other Ambulatory Visit: Payer: Self-pay

## 2019-10-25 DIAGNOSIS — Z20828 Contact with and (suspected) exposure to other viral communicable diseases: Secondary | ICD-10-CM

## 2019-10-25 DIAGNOSIS — R05 Cough: Secondary | ICD-10-CM

## 2019-10-25 DIAGNOSIS — J069 Acute upper respiratory infection, unspecified: Secondary | ICD-10-CM | POA: Diagnosis not present

## 2019-10-25 DIAGNOSIS — Z20822 Contact with and (suspected) exposure to covid-19: Secondary | ICD-10-CM

## 2019-10-25 MED ORDER — GUAIFENESIN-CODEINE 100-10 MG/5ML PO SYRP: 5.0000 mL | ORAL_SOLUTION | Freq: Three times a day (TID) | ORAL | 0 refills | Status: DC | PRN

## 2019-10-25 NOTE — Discharge Instructions (Signed)
Rest.  Push fluids. Take Tylenol for pain fever You may take over-the-counter cough and cold medicines I am prescribing Robitussin with codeine for the cough, it may cause drowsiness Quarantine at home until your coronavirus test is available

## 2019-10-25 NOTE — ED Provider Notes (Signed)
[place]    CSN: [id number] Arrival date & time: 10/25/19  0830      History   Chief Complaint Chief Complaint  Patient presents with  . APPT (8:30) cough, congestion, sore throat    HPI Misty Bailey is a 48 y.o. female.   HPI  Patient is here for an upper respiratory infection.  She states that she has had sore throat, cough, cold, postnasal drip, and fatigue for the last 5 to 6 days.  She states that she is "cold natured" and does not know if she has had any chills.  She has not taken her temperature.  She says if she had any fever was low-grade.  She has not had body aches.  She is short of breath with her coughing.  She does not feel like she is wheezing and has not used her albuterol inhaler.  She does have pre-existing asthma.  No known exposure to coronavirus.  She states she is good about wearing her mask and handwashing.  No one else at home is sick.  Past Medical History:  Diagnosis Date  . Anemia   . Arthritis 02/2014   Rib Cage  . Depression   . Dysmenorrhea 05/08/2018  . Essential hypertension 07/29/2019  . Panic attacks   . Suppurative hidradenitis     Patient Active Problem List   Diagnosis Date Noted  . Hyperlipidemia 07/30/2019  . Syncope 07/29/2019  . Essential hypertension 07/29/2019  . Elevated blood pressure reading 05/08/2018  . Dysmenorrhea 05/08/2018  . Herpes 03/21/2014  . Suppurative hidradenitis   . Depression   . Pap smear for cervical cancer screening 08/09/2013  . PE (physical exam), annual 08/09/2013    Past Surgical History:  Procedure Laterality Date  . FOOT SURGERY    . PELVIC LAPAROSCOPY  1998/1999 `   pelvic adhesions and pain    OB History    Gravida  2   Para  1   Term      Preterm  1   AB  1   Living  0     SAB  1   TAB      Ectopic      Multiple      Live Births               Home Medications    Prior to Admission medications   Medication Sig Start Date End Date Taking?  Authorizing Provider  albuterol (PROVENTIL HFA;VENTOLIN HFA) 108 (90 Base) MCG/ACT inhaler Inhale 2 puffs into the lungs every 6 (six) hours as needed for wheezing or shortness of breath. 01/25/18   Argentina Donovan, PA-C  amLODipine (NORVASC) 5 MG tablet Take 1 tablet (5 mg total) by mouth daily. 07/29/19   Ladell Pier, MD  atorvastatin (LIPITOR) 10 MG tablet Take 1 tablet (10 mg total) by mouth daily. 07/30/19   Ladell Pier, MD  guaiFENesin-codeine (ROBITUSSIN AC) 100-10 MG/5ML syrup Take 5-10 mLs by mouth 3 (three) times daily as needed for cough. 10/25/19   Raylene Everts, MD    Family History Family History  Problem Relation Age of Onset  . Diabetes Mother   . Hypertension Mother   . Hyperlipidemia Mother   . Heart murmur Mother   . Hypertension Father   . Stroke Father   . Hyperlipidemia Father   . Hypertension Maternal Grandmother   . Diabetes Maternal Grandmother   . Osteoporosis Maternal Grandmother   . Thyroid disease Paternal Aunt   .  Breast cancer Neg Hx     Social History Social History   Tobacco Use  . Smoking status: Former Research scientist (life sciences)  . Smokeless tobacco: Current User  . Tobacco comment: Weed  Substance Use Topics  . Alcohol use: Yes    Alcohol/week: 0.0 standard drinks    Comment: Occassionally   . Drug use: No     Allergies   Patient has no known allergies.   Review of Systems Review of Systems  Constitutional: Positive for appetite change and fatigue. Negative for chills and fever.  HENT: Positive for congestion, postnasal drip, rhinorrhea and sore throat. Negative for ear pain.   Eyes: Negative for pain and visual disturbance.  Respiratory: Positive for cough and shortness of breath. Negative for wheezing.   Cardiovascular: Negative for chest pain and palpitations.  Gastrointestinal: Negative for abdominal pain and vomiting.  Genitourinary: Negative for dysuria and hematuria.  Musculoskeletal: Negative for arthralgias, back pain and  myalgias.  Skin: Negative for color change and rash.  Neurological: Positive for headaches. Negative for seizures and syncope.  Psychiatric/Behavioral: Positive for sleep disturbance.  All other systems reviewed and are negative.    Physical Exam Triage Vital Signs ED Triage Vitals  Enc Vitals Group     BP 10/25/19 0849 116/70     Pulse Rate 10/25/19 0849 78     Resp 10/25/19 0849 16     Temp 10/25/19 0849 99.2 F (37.3 C)     Temp Source 10/25/19 0849 Oral     SpO2 10/25/19 0849 100 %     Weight --      Height --      Head Circumference --      Peak Flow --      Pain Score 10/25/19 0851 0     Pain Loc --      Pain Edu? --      Excl. in [place]? --    No data found.  Updated Vital Signs BP 116/70 (BP Location: Left Arm)   Pulse 78   Temp 99.2 F (37.3 C) (Oral)   Resp 16   LMP 10/09/2019   SpO2 100%      Physical Exam Constitutional:      General: She is not in acute distress.    Appearance: She is well-developed.     Comments: Appears tired  HENT:     Head: Normocephalic and atraumatic.     Right Ear: There is impacted cerumen.     Left Ear: There is impacted cerumen.     Nose: Congestion and rhinorrhea present.     Mouth/Throat:     Mouth: Mucous membranes are moist.     Pharynx: Posterior oropharyngeal erythema present.  Eyes:     Conjunctiva/sclera: Conjunctivae normal.     Pupils: Pupils are equal, round, and reactive to light.  Neck:     Musculoskeletal: Normal range of motion.  Cardiovascular:     Rate and Rhythm: Normal rate and regular rhythm.     Heart sounds: Normal heart sounds.  Pulmonary:     Effort: Pulmonary effort is normal. No respiratory distress.     Breath sounds: Normal breath sounds. No wheezing or rales.  Abdominal:     General: There is no distension.     Palpations: Abdomen is soft.  Musculoskeletal: Normal range of motion.  Lymphadenopathy:     Cervical: No cervical adenopathy.  Skin:    General: Skin is warm and dry.   Neurological:     Mental  Status: She is alert.  Psychiatric:        Mood and Affect: Mood normal.        Behavior: Behavior normal.      UC Treatments / Results  Labs (all labs ordered are listed, but only abnormal results are displayed) Labs Reviewed  NOVEL CORONAVIRUS, NAA (HOSP ORDER, SEND-OUT TO REF LAB; TAT 18-24 HRS)    EKG   Radiology No results found.  Procedures Procedures (including critical care time)  Medications Ordered in UC Medications - No data to display  Initial Impression / Assessment and Plan / UC Course  I have reviewed the triage vital signs and the nursing notes.  Pertinent labs & imaging results that were available during my care of the patient were reviewed by me and considered in my medical decision making (see chart for details).     Discussed possible coronavirus.  Appropriate treatment.  Testing is done today.  She knows to quarantine until results are available.  She states that the cough is keeping her awake at night.  She has uncontrollable coughing spells.  We will give her Robitussin with codeine to use sparingly.  Return if worse at any time, increased shortness of breath, fatigue, dizziness Final Clinical Impressions(s) / UC Diagnoses   Final diagnoses:  Acute upper respiratory infection  Suspected COVID-19 virus infection     Discharge Instructions     Rest.  Push fluids. Take Tylenol for pain fever You may take over-the-counter cough and cold medicines I am prescribing Robitussin with codeine for the cough, it may cause drowsiness Quarantine at home until your coronavirus test is available   ED Prescriptions    Medication Sig Dispense Auth. Provider   guaiFENesin-codeine (ROBITUSSIN AC) 100-10 MG/5ML syrup Take 5-10 mLs by mouth 3 (three) times daily as needed for cough. 236 mL Raylene Everts, MD     PDMP not reviewed this encounter.   Raylene Everts, MD 10/25/19 [phone]

## 2019-10-25 NOTE — ED Triage Notes (Signed)
Pt presents to UC w/ c/o cough, sore throat, head congestion x6 days.

## 2019-10-27 LAB — NOVEL CORONAVIRUS, NAA (HOSP ORDER, SEND-OUT TO REF LAB; TAT 18-24 HRS): SARS-CoV-2, NAA: NOT DETECTED

## 2019-10-29 ENCOUNTER — Other Ambulatory Visit: Payer: Self-pay

## 2019-10-29 ENCOUNTER — Ambulatory Visit (HOSPITAL_COMMUNITY)
Admission: EM | Admit: 2019-10-29 | Discharge: 2019-10-29 | Disposition: A | Discharge status: Home / Self Care | Payer: BC Managed Care – PPO | Attending: Urgent Care | Admitting: Urgent Care

## 2019-10-29 ENCOUNTER — Encounter (HOSPITAL_COMMUNITY): Payer: Self-pay

## 2019-10-29 ENCOUNTER — Other Ambulatory Visit: Payer: Self-pay | Admitting: Internal Medicine

## 2019-10-29 DIAGNOSIS — R05 Cough: Secondary | ICD-10-CM

## 2019-10-29 DIAGNOSIS — R059 Cough, unspecified: Secondary | ICD-10-CM

## 2019-10-29 DIAGNOSIS — J019 Acute sinusitis, unspecified: Secondary | ICD-10-CM

## 2019-10-29 DIAGNOSIS — Z1231 Encounter for screening mammogram for malignant neoplasm of breast: Secondary | ICD-10-CM

## 2019-10-29 MED ORDER — AMOXICILLIN 875 MG PO TABS: 875.0000 mg | ORAL_TABLET | Freq: Two times a day (BID) | ORAL | 0 refills | Status: DC

## 2019-10-29 MED ORDER — BENZONATATE 100 MG PO CAPS: 100.0000 mg | ORAL_CAPSULE | Freq: Three times a day (TID) | ORAL | 0 refills | Status: DC | PRN

## 2019-10-29 NOTE — Discharge Instructions (Signed)
For sore throat or cough try using a honey-based tea. Use 3 teaspoons of honey with juice squeezed from half lemon. Place shaved pieces of ginger into 1/2-1 cup of water and warm over stove top. Then mix the ingredients and repeat every 4 hours as needed. Please take Tylenol 500mg  every 6 hours. Hydrate very well with at least 2 liters of water. Eat light meals such as soups to replenish electrolytes and soft fruits, veggies. Start an antihistamine like Zyrtec, Allegra or Claritin for postnasal drainage, sinus congestion.  You can take this together with pseudoephedrine (Sudafed) at a dose of 30-60 mg 3 times a day or twice daily as needed for the same kind of congestion.

## 2019-10-29 NOTE — ED Provider Notes (Signed)
MRN: [id number] DOB: 10-03-1971  Subjective:   Misty Bailey is a 48 y.o. female presenting for 2 week history of dry cough.  Still has sinus congestion, sinus headaches and pain, runny nose, throat pain.  Reports that she is not having sweats and chills.  COVID-19 testing was negative.  Has a history of asthma, uses albuterol inhaler as needed, ~2x per week, mostly after exercising.   No current facility-administered medications for this encounter.   Current Outpatient Medications:  .  albuterol (PROVENTIL HFA;VENTOLIN HFA) 108 (90 Base) MCG/ACT inhaler, Inhale 2 puffs into the lungs every 6 (six) hours as needed for wheezing or shortness of breath., Disp: 1 Inhaler, Rfl: 4 .  amLODipine (NORVASC) 5 MG tablet, Take 1 tablet (5 mg total) by mouth daily., Disp: 30 tablet, Rfl: 3 .  atorvastatin (LIPITOR) 10 MG tablet, Take 1 tablet (10 mg total) by mouth daily., Disp: 30 tablet, Rfl: 3 .  guaiFENesin-codeine (ROBITUSSIN AC) 100-10 MG/5ML syrup, Take 5-10 mLs by mouth 3 (three) times daily as needed for cough., Disp: 236 mL, Rfl: 0   No Known Allergies  Past Medical History:  Diagnosis Date  . Anemia   . Arthritis 02/2014   Rib Cage  . Depression   . Dysmenorrhea 05/08/2018  . Essential hypertension 07/29/2019  . Panic attacks   . Suppurative hidradenitis      Past Surgical History:  Procedure Laterality Date  . FOOT SURGERY    . PELVIC LAPAROSCOPY  1998/1999 `   pelvic adhesions and pain    Review of Systems  Constitutional: Positive for chills and malaise/fatigue. Negative for fever.  HENT: Positive for congestion and sore throat. Negative for ear pain and sinus pain.   Eyes: Negative for discharge and redness.  Respiratory: Positive for cough. Negative for hemoptysis, shortness of breath and wheezing.   Cardiovascular: Negative for chest pain.  Gastrointestinal: Negative for abdominal pain, diarrhea, nausea and vomiting.  Genitourinary: Negative for dysuria, flank pain and  hematuria.  Musculoskeletal: Negative for myalgias.  Skin: Negative for rash.  Neurological: Positive for headaches. Negative for dizziness and weakness.  Psychiatric/Behavioral: Negative for depression and substance abuse.   Social History   Tobacco Use  . Smoking status: Former Research scientist (life sciences)  . Smokeless tobacco: Current User  . Tobacco comment: Weed  Substance Use Topics  . Alcohol use: Yes    Alcohol/week: 0.0 standard drinks    Comment: Occassionally   . Drug use: No   Family History  Problem Relation Age of Onset  . Diabetes Mother   . Hypertension Mother   . Hyperlipidemia Mother   . Heart murmur Mother   . Hypertension Father   . Stroke Father   . Hyperlipidemia Father   . Hypertension Maternal Grandmother   . Diabetes Maternal Grandmother   . Osteoporosis Maternal Grandmother   . Thyroid disease Paternal Aunt   . Breast cancer Neg Hx     Objective:   Vitals: BP (!) 145/84 (BP Location: Right Arm)   Pulse 98   Temp 98.5 F (36.9 C) (Oral)   Resp 16   LMP 10/09/2019   SpO2 100%   Physical Exam Constitutional:      General: She is not in acute distress.    Appearance: Normal appearance. She is well-developed. She is not ill-appearing, toxic-appearing or diaphoretic.  HENT:     Head: Normocephalic and atraumatic.     Right Ear: Tympanic membrane and ear canal normal. No drainage or tenderness. No middle ear  effusion. Tympanic membrane is not erythematous.     Left Ear: Tympanic membrane and ear canal normal. No drainage or tenderness.  No middle ear effusion. Tympanic membrane is not erythematous.     Nose: Congestion and rhinorrhea present.     Comments: Nasal mucosa boggy and edematous.    Mouth/Throat:     Mouth: Mucous membranes are moist. No oral lesions.     Pharynx: Posterior oropharyngeal erythema present. No pharyngeal swelling, oropharyngeal exudate or uvula swelling.     Tonsils: No tonsillar exudate or tonsillar abscesses.     Comments: Significant  postnasal drainage and erythema. Eyes:     Extraocular Movements: Extraocular movements intact.     Right eye: Normal extraocular motion.     Left eye: Normal extraocular motion.     Conjunctiva/sclera: Conjunctivae normal.     Pupils: Pupils are equal, round, and reactive to light.  Neck:     Musculoskeletal: Normal range of motion and neck supple.  Cardiovascular:     Rate and Rhythm: Normal rate and regular rhythm.     Pulses: Normal pulses.     Heart sounds: Normal heart sounds. No murmur. No friction rub. No gallop.   Pulmonary:     Effort: Pulmonary effort is normal. No respiratory distress.     Breath sounds: Normal breath sounds. No stridor. No wheezing, rhonchi or rales.  Lymphadenopathy:     Cervical: No cervical adenopathy.  Skin:    General: Skin is warm and dry.     Findings: No rash.  Neurological:     General: No focal deficit present.     Mental Status: She is alert and oriented to person, place, and time.  Psychiatric:        Mood and Affect: Mood normal.        Behavior: Behavior normal.        Thought Content: Thought content normal.        Judgment: Judgment normal.      Assessment and Plan :   1. Acute sinusitis, recurrence not specified, unspecified location   2. Cough     Will cover for sinusitis given clear lung sounds.  Patient is to start amoxicillin, maintain cough suppression syrup.  We will have her use Zyrtec with lower dose of pseudoephedrine.  Counseled patient that if she has no improvement in 3 to 4 days, will need to use short steroid course and consider obtaining chest x-ray.  Will defer the x-ray for now given clear lung sounds and minimal lower respiratory symptoms, 100% oxygen saturation.  Counseled patient on potential for adverse effects with medications prescribed/recommended today, ER and return-to-clinic precautions discussed, patient verbalized understanding.   Jaynee Eagles, [place] 10/29/19 [id number]

## 2019-10-29 NOTE — ED Triage Notes (Signed)
Pt presents for follow up; pt had negative covid test a few days ago but is still experiencing chills, congestion, and non productive cough

## 2019-10-30 ENCOUNTER — Ambulatory Visit: Payer: BC Managed Care – PPO

## 2019-11-11 ENCOUNTER — Other Ambulatory Visit: Payer: Self-pay

## 2019-11-11 ENCOUNTER — Ambulatory Visit: Payer: BC Managed Care – PPO | Admitting: Internal Medicine

## 2019-11-29 ENCOUNTER — Encounter: Payer: Self-pay | Admitting: Internal Medicine

## 2019-11-29 ENCOUNTER — Other Ambulatory Visit: Payer: Self-pay

## 2019-11-29 ENCOUNTER — Ambulatory Visit: Payer: BC Managed Care – PPO | Attending: Internal Medicine | Admitting: Internal Medicine

## 2019-11-29 VITALS — BP 112/73 | HR 69 | Resp 16 | Ht 61.0 in | Wt 137.8 lb

## 2019-11-29 DIAGNOSIS — Z5321 Procedure and treatment not carried out due to patient leaving prior to being seen by health care provider: Secondary | ICD-10-CM

## 2019-11-29 NOTE — Progress Notes (Signed)
Patient left before being seen. I called patient this afternoon and left message on the voicemail letting her know that I was calling to touch base with her since she ended up having to leave before being seen.  I will send a MyChart message.

## 2019-12-06 ENCOUNTER — Encounter (HOSPITAL_COMMUNITY): Payer: Self-pay | Admitting: Emergency Medicine

## 2019-12-06 ENCOUNTER — Ambulatory Visit (HOSPITAL_COMMUNITY)
Admission: EM | Admit: 2019-12-06 | Discharge: 2019-12-06 | Disposition: A | Discharge status: Home / Self Care | Payer: BC Managed Care – PPO | Attending: Family Medicine | Admitting: Family Medicine

## 2019-12-06 ENCOUNTER — Other Ambulatory Visit: Payer: Self-pay

## 2019-12-06 DIAGNOSIS — J069 Acute upper respiratory infection, unspecified: Secondary | ICD-10-CM | POA: Diagnosis not present

## 2019-12-06 DIAGNOSIS — R1031 Right lower quadrant pain: Secondary | ICD-10-CM | POA: Diagnosis not present

## 2019-12-06 DIAGNOSIS — K5732 Diverticulitis of large intestine without perforation or abscess without bleeding: Secondary | ICD-10-CM | POA: Diagnosis present

## 2019-12-06 DIAGNOSIS — Z20822 Contact with and (suspected) exposure to covid-19: Secondary | ICD-10-CM

## 2019-12-06 DIAGNOSIS — Z20828 Contact with and (suspected) exposure to other viral communicable diseases: Secondary | ICD-10-CM | POA: Diagnosis not present

## 2019-12-06 DIAGNOSIS — R05 Cough: Secondary | ICD-10-CM

## 2019-12-06 MED ORDER — CIPROFLOXACIN HCL 500 MG PO TABS: 500.0000 mg | ORAL_TABLET | Freq: Two times a day (BID) | ORAL | 0 refills | Status: DC

## 2019-12-06 MED ORDER — METRONIDAZOLE 500 MG PO TABS: 500.0000 mg | ORAL_TABLET | Freq: Two times a day (BID) | ORAL | 0 refills | Status: DC

## 2019-12-06 NOTE — ED Provider Notes (Signed)
[facility]    CSN: [id number] Arrival date & time: 12/06/19  [id number]      History   Chief Complaint Chief Complaint  Patient presents with  . URI    HPI Misty Bailey is a 48 y.o. female.   HPI  Patient is here with 2 problems.  First she has had a cold for over a week.  She has runny nose, stuffy nose, sore throat, coughing.  She states it feels like a common cold.  She needs coronavirus testing in order to go back to work.  She does not think she is had exposure to coronavirus.  She is good about wearing a mask.  She tries to social distancing and hand wash. Her other symptom is abdominal pain and diarrhea.  This is been going on for about a week.  She states that she is has lower abdominal crampy pain.  Frequent bowel movements.  They are loose to watery.  Sometimes with mucus.  No blood.  She states one of her bowel movements look black.  She does endorse using Pepto-Bismol for her symptoms.  She states she had a colonoscopy last year.  They identified that she has diverticulosis.  She has never had diverticulitis.  She has had some sweats especially when her abdominal pain is severe but did not take her temperature.  Past Medical History:  Diagnosis Date  . Anemia   . Arthritis 02/2014   Rib Cage  . Depression   . Dysmenorrhea 05/08/2018  . Essential hypertension 07/29/2019  . Panic attacks   . Suppurative hidradenitis     Patient Active Problem List   Diagnosis Date Noted  . Hyperlipidemia 07/30/2019  . Syncope 07/29/2019  . Essential hypertension 07/29/2019  . Elevated blood pressure reading 05/08/2018  . Dysmenorrhea 05/08/2018  . Herpes 03/21/2014  . Suppurative hidradenitis   . Depression   . Pap smear for cervical cancer screening 08/09/2013  . PE (physical exam), annual 08/09/2013    Past Surgical History:  Procedure Laterality Date  . FOOT SURGERY    . PELVIC LAPAROSCOPY  1998/1999 `   pelvic adhesions and pain    OB History    Gravida   2   Para  1   Term      Preterm  1   AB  1   Living  0     SAB  1   TAB      Ectopic      Multiple      Live Births               Home Medications    Prior to Admission medications   Medication Sig Start Date End Date Taking? Authorizing Provider  albuterol (PROVENTIL HFA;VENTOLIN HFA) 108 (90 Base) MCG/ACT inhaler Inhale 2 puffs into the lungs every 6 (six) hours as needed for wheezing or shortness of breath. 01/25/18   Argentina Donovan, PA-C  amLODipine (NORVASC) 5 MG tablet Take 1 tablet (5 mg total) by mouth daily. 07/29/19   Ladell Pier, MD  atorvastatin (LIPITOR) 10 MG tablet Take 1 tablet (10 mg total) by mouth daily. 07/30/19   Ladell Pier, MD  ciprofloxacin (CIPRO) 500 MG tablet Take 1 tablet (500 mg total) by mouth 2 (two) times daily. 12/06/19   Raylene Everts, MD  metroNIDAZOLE (FLAGYL) 500 MG tablet Take 1 tablet (500 mg total) by mouth 2 (two) times daily. 12/06/19   Raylene Everts, MD  Family History Family History  Problem Relation Age of Onset  . Diabetes Mother   . Hypertension Mother   . Hyperlipidemia Mother   . Heart murmur Mother   . Hypertension Father   . Stroke Father   . Hyperlipidemia Father   . Hypertension Maternal Grandmother   . Diabetes Maternal Grandmother   . Osteoporosis Maternal Grandmother   . Thyroid disease Paternal Aunt   . Breast cancer Neg Hx     Social History Social History   Tobacco Use  . Smoking status: Former Research scientist (life sciences)  . Smokeless tobacco: Current User  . Tobacco comment: Weed  Substance Use Topics  . Alcohol use: Yes    Alcohol/week: 0.0 standard drinks    Comment: Occassionally   . Drug use: No     Allergies   Patient has no known allergies.   Review of Systems Review of Systems  Constitutional: Positive for appetite change, diaphoresis and fatigue. Negative for chills and fever.  HENT: Positive for congestion, postnasal drip, rhinorrhea and sore throat. Negative for ear  pain.   Eyes: Negative for pain and visual disturbance.  Respiratory: Negative for cough, chest tightness and shortness of breath.   Cardiovascular: Negative for chest pain and palpitations.  Gastrointestinal: Positive for abdominal pain, diarrhea and nausea. Negative for vomiting.  Genitourinary: Negative for dysuria and hematuria.  Musculoskeletal: Negative for arthralgias and back pain.  Skin: Negative for color change and rash.  Neurological: Negative for seizures, syncope and headaches.  Psychiatric/Behavioral: Negative for sleep disturbance.  All other systems reviewed and are negative.    Physical Exam Triage Vital Signs ED Triage Vitals  Enc Vitals Group     BP 12/06/19 0847 121/75     Pulse Rate 12/06/19 0847 85     Resp 12/06/19 0847 14     Temp 12/06/19 0847 99 F (37.2 C)     Temp Source 12/06/19 0847 Oral     SpO2 12/06/19 0847 100 %     Weight --      Height --      Head Circumference --      Peak Flow --      Pain Score 12/06/19 0848 8     Pain Loc --      Pain Edu? --      Excl. in [place]? --    No data found.  Updated Vital Signs BP 121/75 (BP Location: Left Arm)   Pulse 85   Temp 99 F (37.2 C) (Oral)   Resp 14   LMP 11/22/2019   SpO2 100%       Physical Exam Constitutional:      General: She is not in acute distress.    Appearance: She is well-developed and normal weight. She is ill-appearing.     Comments: Patient appears uncomfortable  HENT:     Head: Normocephalic and atraumatic.     Right Ear: Tympanic membrane and ear canal normal.     Left Ear: Tympanic membrane and ear canal normal.     Nose: Rhinorrhea present.     Mouth/Throat:     Mouth: Mucous membranes are moist.     Pharynx: Posterior oropharyngeal erythema present.     Comments: Clear rhinorrhea.  Posterior pharynx mildly injected without exudate Eyes:     Conjunctiva/sclera: Conjunctivae normal.     Pupils: Pupils are equal, round, and reactive to light.  Cardiovascular:      Rate and Rhythm: Normal rate and regular rhythm.  Heart sounds: Normal heart sounds.  Pulmonary:     Effort: Pulmonary effort is normal. No respiratory distress.     Breath sounds: Normal breath sounds. No wheezing, rhonchi or rales.  Abdominal:     General: Abdomen is flat. There is no distension.     Palpations: Abdomen is soft.     Tenderness: There is abdominal tenderness. There is no guarding or rebound.     Comments: Hyperactive bowel sounds.  Tenderness in the right greater than left lower quadrants to deep palpation.  No palpable mass or organomegaly.  No guarding or rebound  Musculoskeletal:        General: Normal range of motion.     Cervical back: Normal range of motion.  Lymphadenopathy:     Cervical: No cervical adenopathy.  Skin:    General: Skin is warm and dry.  Neurological:     Mental Status: She is alert.      UC Treatments / Results  Labs (all labs ordered are listed, but only abnormal results are displayed) Labs Reviewed  NOVEL CORONAVIRUS, NAA (HOSP ORDER, SEND-OUT TO REF LAB; TAT 18-24 HRS)    EKG   Radiology No results found.  Procedures Procedures (including critical care time)  Medications Ordered in UC Medications - No data to display  Initial Impression / Assessment and Plan / UC Course  I have reviewed the triage vital signs and the nursing notes.  Pertinent labs & imaging results that were available during my care of the patient were reviewed by me and considered in my medical decision making (see chart for details).     Concern for persistent diarrhea with mucus in bowels and abdominal pain.  This could be diverticulitis.  Will treat with antibiotics and follow Final Clinical Impressions(s) / UC Diagnoses   Final diagnoses:  Viral URI with cough  Encounter for screening laboratory testing for COVID-19 virus  Right lower quadrant abdominal pain  Diverticulitis of colon     Discharge Instructions     Lots of fluids so you  do not get dehydrated [place] diet as tolerated Take the antibiotics 2 x a day with a bit of food Quarantine until you get the COVID result Call for problems    ED Prescriptions    Medication Sig Dispense Auth. Provider   metroNIDAZOLE (FLAGYL) 500 MG tablet Take 1 tablet (500 mg total) by mouth 2 (two) times daily. 14 tablet Raylene Everts, MD   ciprofloxacin (CIPRO) 500 MG tablet Take 1 tablet (500 mg total) by mouth 2 (two) times daily. 14 tablet Raylene Everts, MD     PDMP not reviewed this encounter.   Raylene Everts, MD 12/06/19 [phone]

## 2019-12-06 NOTE — Discharge Instructions (Signed)
Lots of fluids so you do not get dehydrated Criss Rosales diet as tolerated Take the antibiotics 2 x a day with a bit of food Quarantine until you get the COVID result Call for problems

## 2019-12-06 NOTE — ED Triage Notes (Signed)
Pt c/o cold sx onset 10 days associated w/cough, nasal drainage, sore throat, abd pain, diarrhea  Denies fevers, SOB, dyspnea  A&O x4... NAD.Marland Kitchen. ambulatory

## 2019-12-08 LAB — NOVEL CORONAVIRUS, NAA (HOSP ORDER, SEND-OUT TO REF LAB; TAT 18-24 HRS): SARS-CoV-2, NAA: NOT DETECTED

## 2019-12-23 ENCOUNTER — Ambulatory Visit: Payer: BC Managed Care – PPO

## 2020-01-27 ENCOUNTER — Other Ambulatory Visit: Payer: Self-pay

## 2020-01-27 ENCOUNTER — Encounter (HOSPITAL_COMMUNITY): Payer: Self-pay

## 2020-01-27 ENCOUNTER — Ambulatory Visit (HOSPITAL_COMMUNITY)
Admission: EM | Admit: 2020-01-27 | Discharge: 2020-01-27 | Disposition: A | Discharge status: Home / Self Care | Payer: BC Managed Care – PPO | Attending: Family Medicine | Admitting: Family Medicine

## 2020-01-27 DIAGNOSIS — N760 Acute vaginitis: Secondary | ICD-10-CM

## 2020-01-27 DIAGNOSIS — J069 Acute upper respiratory infection, unspecified: Secondary | ICD-10-CM | POA: Diagnosis not present

## 2020-01-27 DIAGNOSIS — Z20822 Contact with and (suspected) exposure to covid-19: Secondary | ICD-10-CM | POA: Diagnosis not present

## 2020-01-27 MED ORDER — FLUCONAZOLE 150 MG PO TABS: 150.0000 mg | ORAL_TABLET | Freq: Once | ORAL | 0 refills | Status: AC

## 2020-01-27 MED ORDER — BENZONATATE 200 MG PO CAPS: 200.0000 mg | ORAL_CAPSULE | Freq: Three times a day (TID) | ORAL | 0 refills | Status: AC | PRN

## 2020-01-27 MED ORDER — METRONIDAZOLE 0.75 % EX GEL: 1.0000 "application " | Freq: Every day | CUTANEOUS | 0 refills | Status: AC

## 2020-01-27 MED ORDER — FLUTICASONE PROPIONATE 50 MCG/ACT NA SUSP: 1.0000 | Freq: Every day | NASAL | 0 refills | Status: DC

## 2020-01-27 MED ORDER — CETIRIZINE HCL 10 MG PO CAPS: 10.0000 mg | ORAL_CAPSULE | Freq: Every day | ORAL | 0 refills | Status: DC

## 2020-01-27 NOTE — ED Provider Notes (Signed)
[place]    CSN: [id number] Arrival date & time: 01/27/20  1246      History   Chief Complaint Chief Complaint  Patient presents with  . Appointment    1:00  . Sore Throat  . SEXUALLY TRANSMITTED DISEASE    HPI Misty Bailey is a 49 y.o. female history of hypertension, presenting today for evaluation of URI symptoms and vaginal discharge.  Patient states that over the past 2 to 3 days she has developed congestion and sore throat.  She has that her son recently tested positive and she was around him a few days before her symptoms began.  She has had a mild cough.  Denies chest pain or shortness of breath.  Has felt very fatigued.  She is also concerned about vaginal discharge which has been present over the past 2 weeks.  Also irritated and itchy.  Last menstrual cycle was approximately 2 weeks ago.  Similar to prior BV.  Denies concern for STDs, same partner for the past 30 years.  Denies rashes or lesions.  As noted urine is cloudy, but denies any dysuria, increased frequency or urgency.  Recently on antibiotics.  HPI  Past Medical History:  Diagnosis Date  . Anemia   . Arthritis 02/2014   Rib Cage  . Depression   . Dysmenorrhea 05/08/2018  . Essential hypertension 07/29/2019  . Panic attacks   . Suppurative hidradenitis     Patient Active Problem List   Diagnosis Date Noted  . Hyperlipidemia 07/30/2019  . Syncope 07/29/2019  . Essential hypertension 07/29/2019  . Elevated blood pressure reading 05/08/2018  . Dysmenorrhea 05/08/2018  . Herpes 03/21/2014  . Suppurative hidradenitis   . Depression   . Pap smear for cervical cancer screening 08/09/2013  . PE (physical exam), annual 08/09/2013    Past Surgical History:  Procedure Laterality Date  . FOOT SURGERY    . PELVIC LAPAROSCOPY  1998/1999 `   pelvic adhesions and pain    OB History    Gravida  2   Para  1   Term      Preterm  1   AB  1   Living  0     SAB  1   TAB      Ectopic      Multiple      Live Births               Home Medications    Prior to Admission medications   Medication Sig Start Date End Date Taking? Authorizing Provider  albuterol (PROVENTIL HFA;VENTOLIN HFA) 108 (90 Base) MCG/ACT inhaler Inhale 2 puffs into the lungs every 6 (six) hours as needed for wheezing or shortness of breath. 01/25/18   Argentina Donovan, PA-C  amLODipine (NORVASC) 5 MG tablet Take 1 tablet (5 mg total) by mouth daily. 07/29/19   Ladell Pier, MD  atorvastatin (LIPITOR) 10 MG tablet Take 1 tablet (10 mg total) by mouth daily. 07/30/19   Ladell Pier, MD  benzonatate (TESSALON) 200 MG capsule Take 1 capsule (200 mg total) by mouth 3 (three) times daily as needed for up to 7 days for cough. 01/27/20 02/03/20  Giavonni Fonder C, PA-C  Cetirizine HCl 10 MG CAPS Take 1 capsule (10 mg total) by mouth daily for 10 days. 01/27/20 02/06/20  Yamila Cragin C, PA-C  fluconazole (DIFLUCAN) 150 MG tablet Take 1 tablet (150 mg total) by mouth once for 1 dose. 01/27/20 01/27/20  Emmanuella Mirante,  Ermine Spofford C, PA-C  fluticasone (FLONASE) 50 MCG/ACT nasal spray Place 1-2 sprays into both nostrils daily for 7 days. 01/27/20 02/03/20  Estella Malatesta C, PA-C  metroNIDAZOLE (METROGEL) 0.75 % gel Apply 1 application topically at bedtime for 5 days. 01/27/20 02/01/20  Deleah Tison, Elesa Hacker, PA-C    Family History Family History  Problem Relation Age of Onset  . Diabetes Mother   . Hypertension Mother   . Hyperlipidemia Mother   . Heart murmur Mother   . Hypertension Father   . Stroke Father   . Hyperlipidemia Father   . Hypertension Maternal Grandmother   . Diabetes Maternal Grandmother   . Osteoporosis Maternal Grandmother   . Thyroid disease Paternal Aunt   . Breast cancer Neg Hx     Social History Social History   Tobacco Use  . Smoking status: Former Research scientist (life sciences)  . Smokeless tobacco: Current User  . Tobacco comment: Weed  Substance Use Topics  . Alcohol use: Yes    Alcohol/week: 0.0  standard drinks    Comment: Occassionally   . Drug use: No     Allergies   Patient has no known allergies.   Review of Systems Review of Systems  Constitutional: Positive for fatigue. Negative for activity change, appetite change, chills and fever.  HENT: Positive for congestion, rhinorrhea, sinus pressure and sore throat. Negative for ear pain and trouble swallowing.   Eyes: Negative for discharge and redness.  Respiratory: Positive for cough. Negative for chest tightness and shortness of breath.   Cardiovascular: Negative for chest pain.  Gastrointestinal: Negative for abdominal pain, diarrhea, nausea and vomiting.  Genitourinary: Positive for vaginal discharge. Negative for dysuria, flank pain, genital sores, hematuria, menstrual problem, vaginal bleeding and vaginal pain.  Musculoskeletal: Negative for back pain and myalgias.  Skin: Negative for rash.  Neurological: Negative for dizziness, light-headedness and headaches.     Physical Exam Triage Vital Signs ED Triage Vitals  Enc Vitals Group     BP 01/27/20 1306 139/77     Pulse Rate 01/27/20 1306 70     Resp 01/27/20 1306 16     Temp 01/27/20 1306 98.7 F (37.1 C)     Temp Source 01/27/20 1306 Oral     SpO2 01/27/20 1306 98 %     Weight --      Height --      Head Circumference --      Peak Flow --      Pain Score 01/27/20 1304 0     Pain Loc --      Pain Edu? --      Excl. in [place]? --    No data found.  Updated Vital Signs BP 139/77 (BP Location: Left Arm)   Pulse 70   Temp 98.7 F (37.1 C) (Oral)   Resp 16   SpO2 98%   Visual Acuity Right Eye Distance:   Left Eye Distance:   Bilateral Distance:    Right Eye Near:   Left Eye Near:    Bilateral Near:     Physical Exam Vitals and nursing note reviewed.  Constitutional:      General: She is not in acute distress.    Appearance: She is well-developed.  HENT:     Head: Normocephalic and atraumatic.     Ears:     Comments: Bilateral cerumen  impaction    Mouth/Throat:     Comments: Oral mucosa pink and moist, no tonsillar enlargement or exudate. Posterior pharynx patent and nonerythematous, no  uvula deviation or swelling. Normal phonation. Eyes:     Conjunctiva/sclera: Conjunctivae normal.  Cardiovascular:     Rate and Rhythm: Normal rate and regular rhythm.     Heart sounds: No murmur.  Pulmonary:     Effort: Pulmonary effort is normal. No respiratory distress.     Breath sounds: Normal breath sounds.     Comments: Breathing comfortably at rest, CTABL, no wheezing, rales or other adventitious sounds auscultated  Occasional coarse cough during visit Abdominal:     Palpations: Abdomen is soft.     Tenderness: There is no abdominal tenderness.  Genitourinary:    Comments: Deferred Musculoskeletal:     Cervical back: Neck supple.  Skin:    General: Skin is warm and dry.  Neurological:     General: No focal deficit present.     Mental Status: She is alert and oriented to person, place, and time.      UC Treatments / Results  Labs (all labs ordered are listed, but only abnormal results are displayed) Labs Reviewed  NOVEL CORONAVIRUS, NAA (HOSP ORDER, SEND-OUT TO REF LAB; TAT 18-24 HRS)    EKG   Radiology No results found.  Procedures Procedures (including critical care time)  Medications Ordered in UC Medications - No data to display  Initial Impression / Assessment and Plan / UC Course  I have reviewed the triage vital signs and the nursing notes.  Pertinent labs & imaging results that were available during my care of the patient were reviewed by me and considered in my medical decision making (see chart for details).    URI symptoms: 2 days, Covid exposure, Covid PCR pending, will treat as Covid and given symptomatic and supportive care with quarantining.  Rest and push fluids.  Providing Flonase, Zyrtec and Tessalon for symptoms.  Vaginitis: BV versus yeast, no concern for STD at this time.   Empirically treating for both with Diflucan given recent antibiotic use as well as MetroGel per patient preference.  Deferring swab.  Discussed strict return precautions. Patient verbalized understanding and is agreeable with plan.  Final Clinical Impressions(s) / UC Diagnoses   Final diagnoses:  Viral URI with cough  Exposure to COVID-19 virus  Vaginitis and vulvovaginitis     Discharge Instructions     Please quarantine while awaiting Covid test, recommending quarantining for 10 days from exposure regardless of test result Please begin Flonase and cetirizine daily to help with nasal congestion and drainage Rest and drink plenty of fluids Tessalon every 8 hours for cough  Please take 1 tablet of Diflucan today to treat for yeast, may repeat second tablet if still having symptoms after finishing course of MetroGel Begin MetroGel daily at bedtime for the next 5 days  Please follow-up if any symptoms not improving, worsening, developing difficulty breathing, shortness of breath, abdominal pain, pelvic pain, worsening symptoms   ED Prescriptions    Medication Sig Dispense Auth. Provider   metroNIDAZOLE (METROGEL) 0.75 % gel Apply 1 application topically at bedtime for 5 days. 45 g Trina Asch C, PA-C   fluconazole (DIFLUCAN) 150 MG tablet Take 1 tablet (150 mg total) by mouth once for 1 dose. 2 tablet Raylan Hanton C, PA-C   fluticasone (FLONASE) 50 MCG/ACT nasal spray Place 1-2 sprays into both nostrils daily for 7 days. 1 g Adante Courington C, PA-C   Cetirizine HCl 10 MG CAPS Take 1 capsule (10 mg total) by mouth daily for 10 days. 10 capsule Angelika Jerrett, [place] C, PA-C  benzonatate (TESSALON) 200 MG capsule Take 1 capsule (200 mg total) by mouth 3 (three) times daily as needed for up to 7 days for cough. 28 capsule Jacqulin Brandenburger, [place] C, PA-C     PDMP not reviewed this encounter.   Janith Lima, [place] 01/27/20 1336

## 2020-01-27 NOTE — Discharge Instructions (Signed)
Please quarantine while awaiting Covid test, recommending quarantining for 10 days from exposure regardless of test result Please begin Flonase and cetirizine daily to help with nasal congestion and drainage Rest and drink plenty of fluids Tessalon every 8 hours for cough  Please take 1 tablet of Diflucan today to treat for yeast, may repeat second tablet if still having symptoms after finishing course of MetroGel Begin MetroGel daily at bedtime for the next 5 days  Please follow-up if any symptoms not improving, worsening, developing difficulty breathing, shortness of breath, abdominal pain, pelvic pain, worsening symptoms

## 2020-01-27 NOTE — ED Triage Notes (Signed)
Patient presents to Urgent Care with complaints of vaginal discharge and pain since about 2 weeks ago. Patient reports she has also been having a sore throat and runny nose and her son just tested positive for covid.

## 2020-01-28 ENCOUNTER — Telehealth (HOSPITAL_COMMUNITY): Payer: Self-pay | Admitting: Emergency Medicine

## 2020-01-28 MED ORDER — METRONIDAZOLE 0.75 % VA GEL: 1.0000 | Freq: Every day | VAGINAL | 0 refills | Status: AC

## 2020-01-28 NOTE — Telephone Encounter (Signed)
Topical metrogel sent in yesterday instead of vaginal. Resending correct rx today.

## 2020-01-29 LAB — NOVEL CORONAVIRUS, NAA (HOSP ORDER, SEND-OUT TO REF LAB; TAT 18-24 HRS): SARS-CoV-2, NAA: NOT DETECTED

## 2020-03-05 ENCOUNTER — Encounter (HOSPITAL_COMMUNITY): Payer: Self-pay

## 2020-03-05 ENCOUNTER — Other Ambulatory Visit: Payer: Self-pay

## 2020-03-05 ENCOUNTER — Ambulatory Visit (HOSPITAL_COMMUNITY)
Admission: EM | Admit: 2020-03-05 | Discharge: 2020-03-05 | Disposition: A | Discharge status: Home / Self Care | Payer: BC Managed Care – PPO | Attending: Family Medicine | Admitting: Family Medicine

## 2020-03-05 DIAGNOSIS — B9789 Other viral agents as the cause of diseases classified elsewhere: Secondary | ICD-10-CM | POA: Insufficient documentation

## 2020-03-05 DIAGNOSIS — Z20822 Contact with and (suspected) exposure to covid-19: Secondary | ICD-10-CM | POA: Insufficient documentation

## 2020-03-05 DIAGNOSIS — J028 Acute pharyngitis due to other specified organisms: Secondary | ICD-10-CM | POA: Insufficient documentation

## 2020-03-05 NOTE — ED Triage Notes (Signed)
Pt states her granddaughter tested positive for Covid. Pt states she needs a Covid test. Pt states she has a sore throat x 3 days.

## 2020-03-05 NOTE — Discharge Instructions (Signed)
Go home to rest Drink plenty of fluids Salt water gargles for throat pain Take Tylenol for pain or fever You may take over-the-counter cough and cold medicines as needed You must quarantine at home until your test result is available You can check for your test result in MyChart

## 2020-03-05 NOTE — ED Provider Notes (Signed)
[place]    CSN: [id number] Arrival date & time: 03/05/20  0802      History   Chief Complaint Chief Complaint  Patient presents with  . covid test    HPI Misty Bailey is a 49 y.o. female.   HPI Patient states that both her son and her granddaughter have been positive for Covid in the previous week.  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  She states that she started developing a sore throat on Tuesday, was a bit worse on Wednesday.  Today it is painful sore throat.  No fever chills.  Mild body aches.  No headache or abdominal pain.  No known exposure to strep.  No cough, shortness of breath, difficulty breathing.  No nausea, diarrhea, change in taste or smell. Past Medical History:  Diagnosis Date  . Anemia   . Arthritis 02/2014   Rib Cage  . Depression   . Dysmenorrhea 05/08/2018  . Essential hypertension 07/29/2019  . Panic attacks   . Suppurative hidradenitis     Patient Active Problem List   Diagnosis Date Noted  . Hyperlipidemia 07/30/2019  . Syncope 07/29/2019  . Essential hypertension 07/29/2019  . Elevated blood pressure reading 05/08/2018  . Dysmenorrhea 05/08/2018  . Herpes 03/21/2014  . Suppurative hidradenitis   . Depression   . Pap smear for cervical cancer screening 08/09/2013  . PE (physical exam), annual 08/09/2013    Past Surgical History:  Procedure Laterality Date  . FOOT SURGERY    . PELVIC LAPAROSCOPY  1998/1999 `   pelvic adhesions and pain    OB History    Gravida  2   Para  1   Term      Preterm  1   AB  1   Living  0     SAB  1   TAB      Ectopic      Multiple      Live Births               Home Medications    Prior to Admission medications   Medication Sig Start Date End Date Taking? Authorizing Provider  albuterol (PROVENTIL HFA;VENTOLIN HFA) 108 (90 Base) MCG/ACT inhaler Inhale 2 puffs into the lungs every 6 (six) hours as needed for  wheezing or shortness of breath. 01/25/18   Argentina Donovan, PA-C  amLODipine (NORVASC) 5 MG tablet Take 1 tablet (5 mg total) by mouth daily. 07/29/19   Ladell Pier, MD  atorvastatin (LIPITOR) 10 MG tablet Take 1 tablet (10 mg total) by mouth daily. 07/30/19   Ladell Pier, MD  Cetirizine HCl 10 MG CAPS Take 1 capsule (10 mg total) by mouth daily for 10 days. Patient not taking: Reported on 03/05/2020 01/27/20 02/06/20  Wieters, Hallie C, PA-C  fluticasone (FLONASE) 50 MCG/ACT nasal spray Place 1-2 sprays into both nostrils daily for 7 days. 01/27/20 02/03/20  Wieters, Elesa Hacker, PA-C    Family History Family History  Problem Relation Age of Onset  . Diabetes Mother   . Hypertension Mother   . Hyperlipidemia Mother   . Heart murmur Mother   . Hypertension Father   . Stroke Father   . Hyperlipidemia Father   . Hypertension Maternal Grandmother   . Diabetes Maternal Grandmother   . Osteoporosis Maternal Grandmother   . Thyroid disease Paternal Aunt   . Breast cancer Neg Hx  Social History Social History   Tobacco Use  . Smoking status: Former Research scientist (life sciences)  . Smokeless tobacco: Current User  . Tobacco comment: Weed  Substance Use Topics  . Alcohol use: Yes    Alcohol/week: 0.0 standard drinks    Comment: Occassionally   . Drug use: No     Allergies   Patient has no known allergies.   Review of Systems Review of Systems  Constitutional: Negative for chills, fatigue and fever.  HENT: Positive for sore throat. Negative for congestion.   Respiratory: Negative for cough and shortness of breath.   Gastrointestinal: Negative for diarrhea and vomiting.  Musculoskeletal: Positive for myalgias.  Neurological: Negative for headaches.     Physical Exam Triage Vital Signs ED Triage Vitals  Enc Vitals Group     BP 03/05/20 0816 132/78     Pulse Rate 03/05/20 0816 68     Resp 03/05/20 0816 16     Temp 03/05/20 0816 98.9 F (37.2 C)     Temp Source 03/05/20 0816 Oral      SpO2 03/05/20 0816 100 %     Weight 03/05/20 0814 133 lb (60.3 kg)     Height --      Head Circumference --      Peak Flow --      Pain Score 03/05/20 0814 6     Pain Loc --      Pain Edu? --      Excl. in [place]? --    No data found.  Updated Vital Signs BP 132/78 (BP Location: Right Arm)   Pulse 68   Temp 98.9 F (37.2 C) (Oral)   Resp 16   Wt 60.3 kg   LMP 02/06/2020   SpO2 100%   BMI 25.13 kg/m      Physical Exam Constitutional:      General: She is not in acute distress.    Appearance: She is well-developed and normal weight.     Comments: Smiling.  Pleasant  HENT:     Head: Normocephalic and atraumatic.     Nose: Nose normal.     Mouth/Throat:     Mouth: Mucous membranes are moist.     Pharynx: Posterior oropharyngeal erythema present.     Comments: Posterior pharynx is mildly erythematous.  No exudate Eyes:     Conjunctiva/sclera: Conjunctivae normal.     Pupils: Pupils are equal, round, and reactive to light.  Cardiovascular:     Rate and Rhythm: Normal rate and regular rhythm.  Pulmonary:     Effort: Pulmonary effort is normal. No respiratory distress.  Musculoskeletal:        General: Normal range of motion.     Cervical back: Normal range of motion.  Lymphadenopathy:     Cervical: No cervical adenopathy.  Skin:    General: Skin is warm and dry.  Neurological:     Mental Status: She is alert.  Psychiatric:        Mood and Affect: Mood normal.        Behavior: Behavior normal.      UC Treatments / Results  Labs (all labs ordered are listed, but only abnormal results are displayed) Labs Reviewed  SARS CORONAVIRUS 2 (TAT 6-24 HRS)    EKG   Radiology No results found.  Procedures Procedures (including critical care time)  Medications Ordered in UC Medications - No data to display  Initial Impression / Assessment and Plan / UC Course  I have reviewed the triage  vital signs and the nursing notes.  Pertinent labs & imaging results that  were available during my care of the patient were reviewed by me and considered in my medical decision making (see chart for details).     Reviewed the need to quarantine until Covid test is available. Final Clinical Impressions(s) / UC Diagnoses   Final diagnoses:  Close exposure to COVID-19 virus  Sore throat (viral)     Discharge Instructions     Go home to rest Drink plenty of fluids Salt water gargles for throat pain Take Tylenol for pain or fever You may take over-the-counter cough and cold medicines as needed You must quarantine at home until your test result is available You can check for your test result in MyChart    ED Prescriptions    None     PDMP not reviewed this encounter.   Raylene Everts, MD 03/05/20 [phone]

## 2020-03-06 LAB — SARS CORONAVIRUS 2 (TAT 6-24 HRS): SARS Coronavirus 2: NEGATIVE

## 2020-03-31 ENCOUNTER — Other Ambulatory Visit: Payer: Self-pay | Admitting: Internal Medicine

## 2020-03-31 DIAGNOSIS — Z1231 Encounter for screening mammogram for malignant neoplasm of breast: Secondary | ICD-10-CM

## 2020-04-01 ENCOUNTER — Other Ambulatory Visit: Payer: Self-pay

## 2020-04-01 ENCOUNTER — Ambulatory Visit
Admission: RE | Admit: 2020-04-01 | Discharge: 2020-04-01 | Disposition: A | Discharge status: Home / Self Care | Payer: BC Managed Care – PPO | Source: Ambulatory Visit

## 2020-04-01 DIAGNOSIS — Z1231 Encounter for screening mammogram for malignant neoplasm of breast: Secondary | ICD-10-CM

## 2020-04-30 ENCOUNTER — Encounter: Payer: Self-pay | Admitting: Internal Medicine

## 2020-04-30 ENCOUNTER — Other Ambulatory Visit: Payer: Self-pay

## 2020-04-30 ENCOUNTER — Other Ambulatory Visit (HOSPITAL_COMMUNITY)
Admission: RE | Admit: 2020-04-30 | Discharge: 2020-04-30 | Disposition: A | Discharge status: Home / Self Care | Payer: BC Managed Care – PPO | Source: Ambulatory Visit | Attending: Internal Medicine | Admitting: Internal Medicine

## 2020-04-30 ENCOUNTER — Ambulatory Visit: Payer: BC Managed Care – PPO | Admitting: Internal Medicine

## 2020-04-30 VITALS — BP 148/83 | HR 65 | Temp 97.5°F | Resp 16 | Ht 61.0 in | Wt 134.8 lb

## 2020-04-30 DIAGNOSIS — R203 Hyperesthesia: Secondary | ICD-10-CM | POA: Diagnosis not present

## 2020-04-30 DIAGNOSIS — I10 Essential (primary) hypertension: Secondary | ICD-10-CM | POA: Diagnosis not present

## 2020-04-30 DIAGNOSIS — N920 Excessive and frequent menstruation with regular cycle: Secondary | ICD-10-CM

## 2020-04-30 DIAGNOSIS — N898 Other specified noninflammatory disorders of vagina: Secondary | ICD-10-CM | POA: Diagnosis not present

## 2020-04-30 DIAGNOSIS — J9801 Acute bronchospasm: Secondary | ICD-10-CM

## 2020-04-30 DIAGNOSIS — L0293 Carbuncle, unspecified: Secondary | ICD-10-CM

## 2020-04-30 DIAGNOSIS — K4090 Unilateral inguinal hernia, without obstruction or gangrene, not specified as recurrent: Secondary | ICD-10-CM | POA: Insufficient documentation

## 2020-04-30 DIAGNOSIS — E782 Mixed hyperlipidemia: Secondary | ICD-10-CM

## 2020-04-30 MED ORDER — ALBUTEROL SULFATE HFA 108 (90 BASE) MCG/ACT IN AERS: 2.0000 | INHALATION_SPRAY | Freq: Four times a day (QID) | RESPIRATORY_TRACT | 4 refills | Status: DC | PRN

## 2020-04-30 MED ORDER — MUPIROCIN 2 % EX OINT: TOPICAL_OINTMENT | CUTANEOUS | 0 refills | Status: DC

## 2020-04-30 NOTE — Progress Notes (Signed)
Patient ID: Misty Bailey, female    DOB: Oct 28, 1971  MRN: [id number]  CC: Hypertension and Referral   Subjective: Misty Bailey is a 49 y.o. female who presents for chronic ds management Her concerns today include:  Dysmenorrhea, divertticulosis, HL, HTN  Request referral to derm for sensitive skin in axilla. -Fungal infection in axilla intermittently which she states present as red raw blisters for which she has been seen at urgent care several times in the past. Not able to wear deodarant for over 2 yrs because of this.  She has tried using unscented rub on deodorant. -no rash at this time.  Feels a few dumps on left side at this time -also reports recurrent boils in vaginal area. Treated presumptively for vaginal yeast 2 wks ago through UC.  She feels it may not have completely resolved. C/o vaginal itching and discgh x 3 wks.  Sexually active with one partner.  Does not want STI screen; thinks BV or yeast  Mass LT inguinal area x >1 yr.  About size of an egg. Painful when she laughs or coughs.    Request GYN referral for painful cycles.  Regular and last 4 days.  Heavy and painful first 2 days. Have to change tampon every 4 hrs during the day.  + night sweats and mood swings. Uses Ibuprofen OTC PRN.  Can barely walk or work on first 2 days because menses so heavy Pelvic US 06/2018 revealed no fibroids.  HTN: not taking Norvasc daily. Off schedule due to change in work schedule.  She limits salt in the foods.  No chest pains or shortness of breath.  She is also on atorvastatin for cholesterol and states she has not been to consistent in taking that either for the same reason.   Patient Active Problem List   Diagnosis Date Noted  . Hyperlipidemia 07/30/2019  . Syncope 07/29/2019  . Essential hypertension 07/29/2019  . Elevated blood pressure reading 05/08/2018  . Dysmenorrhea 05/08/2018  . Herpes 03/21/2014  . Suppurative hidradenitis   . Depression   . Pap smear for  cervical cancer screening 08/09/2013  . PE (physical exam), annual 08/09/2013     Current Outpatient Medications on File Prior to Visit  Medication Sig Dispense Refill  . amLODipine (NORVASC) 5 MG tablet Take 1 tablet (5 mg total) by mouth daily. 30 tablet 3  . atorvastatin (LIPITOR) 10 MG tablet Take 1 tablet (10 mg total) by mouth daily. 30 tablet 3  . Cetirizine HCl 10 MG CAPS Take 1 capsule (10 mg total) by mouth daily for 10 days. (Patient not taking: Reported on 03/05/2020) 10 capsule 0  . fluticasone (FLONASE) 50 MCG/ACT nasal spray Place 1-2 sprays into both nostrils daily for 7 days. 1 g 0   No current facility-administered medications on file prior to visit.    No Known Allergies  Social History   Socioeconomic History  . Marital status: Single    Spouse name: Not on file  . Number of children: 0  . Years of education: Associates  . Highest education level: Not on file  Occupational History  . Occupation: Animal nutritionist: [employer]  Tobacco Use  . Smoking status: Former Research scientist (life sciences)  . Smokeless tobacco: Current User  . Tobacco comment: Weed  Substance and Sexual Activity  . Alcohol use: Yes    Alcohol/week: 0.0 standard drinks    Comment: Occassionally   . Drug use: No  . Sexual activity: Not Currently  Partners: Male    Birth control/protection: None  Other Topics Concern  . Not on file  Social History Narrative   Boyfriend incarcerated 2014-2018   Social Determinants of Health   Financial Resource Strain:   . Difficulty of Paying Living Expenses:   Food Insecurity:   . Worried About Charity fundraiser in the Last Year:   . Arboriculturist in the Last Year:   Transportation Needs:   . Film/video editor (Medical):   Marland Kitchen Lack of Transportation (Non-Medical):   Physical Activity:   . Days of Exercise per Week:   . Minutes of Exercise per Session:   Stress:   . Feeling of Stress :   Social Connections:   . Frequency of Communication with  Friends and Family:   . Frequency of Social Gatherings with Friends and Family:   . Attends Religious Services:   . Active Member of Clubs or Organizations:   . Attends Archivist Meetings:   Marland Kitchen Marital Status:   Intimate Partner Violence:   . Fear of Current or Ex-Partner:   . Emotionally Abused:   Marland Kitchen Physically Abused:   . Sexually Abused:     Family History  Problem Relation Age of Onset  . Diabetes Mother   . Hypertension Mother   . Hyperlipidemia Mother   . Heart murmur Mother   . Hypertension Father   . Stroke Father   . Hyperlipidemia Father   . Hypertension Maternal Grandmother   . Diabetes Maternal Grandmother   . Osteoporosis Maternal Grandmother   . Thyroid disease Paternal Aunt   . Breast cancer Neg Hx     Past Surgical History:  Procedure Laterality Date  . FOOT SURGERY    . PELVIC LAPAROSCOPY  1998/1999 `   pelvic adhesions and pain    ROS: Review of Systems Negative except as stated above  PHYSICAL EXAM: BP (!) 148/83   Pulse 65   Temp (!) 97.5 F (36.4 C)   Resp 16   Ht 5\' 1"  (1.549 m)   Wt 134 lb 12.8 oz (61.1 kg)   SpO2 98%   BMI 25.47 kg/m   Wt Readings from Last 3 Encounters:  04/30/20 134 lb 12.8 oz (61.1 kg)  03/05/20 133 lb (60.3 kg)  11/29/19 137 lb 12.8 oz (62.5 kg)    Physical Exam  General appearance - alert, well appearing, and in no distress Mental status - normal mood, behavior, speech, dress, motor activity, and thought processes Chest - clear to auscultation, no wheezes, rales or rhonchi, symmetric air entry Heart - normal rate, regular rhythm, normal S1, S2, no murmurs, rubs, clicks or gallops Abdomen: Nondistended and nontender.  She has an inguinal hernia about the size of an egg in the left inguinal area above the inguinal ligament Pelvic -CMA present: Patient noted to have shaved the mons pubis.  She has scarring noted from previous small boils.  Noted to have small nonfluctuant boil at this time on the mons  pubis more to the left side.  No external vaginal lesions.  Mild amount of watery white discharge within the vaginal vault. Skin -spares hair growth in the axilla.  No rash seen at this time in the axilla   CMP Latest Ref Rng & Units 07/29/2019 07/30/2017 03/20/2014  Glucose 65 - 99 mg/dL 82 114(H) 83  BUN 6 - 24 mg/dL 17 6 10   Creatinine 0.57 - 1.00 mg/dL 1.02(H) 1.07(H) 0.86  Sodium 134 - 144 mmol/L  145(H) 139 140  Potassium 3.5 - 5.2 mmol/L 4.7 4.2 4.3  Chloride 96 - 106 mmol/L 107(H) 108 108  CO2 20 - 29 mmol/L 26 24 26   Calcium 8.7 - 10.2 mg/dL 9.5 9.1 8.8  Total Protein 6.0 - 8.5 g/dL 6.2 7.3 6.3  Total Bilirubin 0.0 - 1.2 mg/dL <0.2 0.5 0.5  Alkaline Phos 39 - 117 IU/L 66 84 61  AST 0 - 40 IU/L 13 14(L) 13  ALT 0 - 32 IU/L 8 10(L) 10   Lipid Panel     Component Value Date/Time   CHOL 257 (H) 07/29/2019 1659   TRIG 121 07/29/2019 1659   HDL 51 07/29/2019 1659   CHOLHDL 5.0 (H) 07/29/2019 1659   LDLCALC 182 (H) 07/29/2019 1659    CBC    Component Value Date/Time   WBC 5.2 07/29/2019 1659   WBC 6.3 07/30/2017 1308   RBC 4.07 07/29/2019 1659   RBC 4.41 07/30/2017 1308   HGB 12.0 07/29/2019 1659   HCT 36.6 07/29/2019 1659   PLT 324 07/29/2019 1659   MCV 90 07/29/2019 1659   MCH 29.5 07/29/2019 1659   MCH 28.6 07/30/2017 1308   MCHC 32.8 07/29/2019 1659   MCHC 32.3 07/30/2017 1308   RDW 12.6 07/29/2019 1659   LYMPHSABS 2.7 01/25/2018 1106   MONOABS 0.5 08/09/2013 1045   EOSABS 0.2 01/25/2018 1106   BASOSABS 0.0 01/25/2018 1106    ASSESSMENT AND PLAN:  1. Essential hypertension Not at goal.  We discussed ways that can help her remember to take the amlodipine every day.  2. Vaginal irritation - Cervicovaginal ancillary only  3. Menorrhagia with regular cycle - CBC - Ambulatory referral to Gynecology  4. Sensitive skin - Ambulatory referral to Dermatology  5. Inguinal hernia of left side without obstruction or gangrene Patient with no obstructive symptoms  or gangrene at this time.  However she feels it is bothersome enough that she would like to see a surgeon about having it corrected. - Ambulatory referral to General Surgery  6. Recurrent boils Likely due to MRSA.  Good handwashing discussed. - mupirocin ointment (BACTROBAN) 2 %; Apply to affected area twice a day as needed  Dispense: 22 g; Refill: 0  7. Bronchospasm Patient requested refill on albuterol - albuterol (VENTOLIN HFA) 108 (90 Base) MCG/ACT inhaler; Inhale 2 puffs into the lungs every 6 (six) hours as needed for wheezing or shortness of breath.  Dispense: 8 g; Refill: 4  8. Mixed hyperlipidemia Continue on encourage use of Lipitor    Patient was given the opportunity to ask questions.  Patient verbalized understanding of the plan and was able to repeat key elements of the plan.   Orders Placed This Encounter  Procedures  . CBC  . Ambulatory referral to Dermatology  . Ambulatory referral to Gynecology  . Ambulatory referral to General Surgery     Requested Prescriptions   Signed Prescriptions Disp Refills  . albuterol (VENTOLIN HFA) 108 (90 Base) MCG/ACT inhaler 8 g 4    Sig: Inhale 2 puffs into the lungs every 6 (six) hours as needed for wheezing or shortness of breath.  . mupirocin ointment (BACTROBAN) 2 % 22 g 0    Sig: Apply to affected area twice a day as needed    Return in about 4 months (around 08/31/2020).  Karle Plumber, MD, FACP

## 2020-04-30 NOTE — Patient Instructions (Signed)

## 2020-05-01 ENCOUNTER — Other Ambulatory Visit: Payer: Self-pay | Admitting: Internal Medicine

## 2020-05-01 LAB — CERVICOVAGINAL ANCILLARY ONLY
Bacterial Vaginitis (gardnerella): POSITIVE — AB
Candida Glabrata: NEGATIVE
Candida Vaginitis: NEGATIVE
Comment: NEGATIVE
Comment: NEGATIVE
Comment: NEGATIVE

## 2020-05-01 LAB — CBC
Hematocrit: 41.1 % (ref 34.0–46.6)
Hemoglobin: 13.7 g/dL (ref 11.1–15.9)
MCH: 30.4 pg (ref 26.6–33.0)
MCHC: 33.3 g/dL (ref 31.5–35.7)
MCV: 91 fL (ref 79–97)
Platelets: 290 10*3/uL (ref 150–450)
RBC: 4.51 x10E6/uL (ref 3.77–5.28)
RDW: 12.2 % (ref 11.7–15.4)
WBC: 6.5 10*3/uL (ref 3.4–10.8)

## 2020-05-01 MED ORDER — METRONIDAZOLE 500 MG PO TABS: 500.0000 mg | ORAL_TABLET | Freq: Two times a day (BID) | ORAL | 0 refills | Status: DC

## 2020-05-04 ENCOUNTER — Encounter: Payer: Self-pay | Admitting: Internal Medicine

## 2020-05-04 MED ORDER — METRONIDAZOLE 0.75 % VA GEL: 1.0000 | Freq: Two times a day (BID) | VAGINAL | 0 refills | Status: DC

## 2020-05-13 ENCOUNTER — Encounter: Payer: Self-pay | Admitting: Internal Medicine

## 2020-05-13 NOTE — Progress Notes (Signed)
I received note from [facility] dermatology and skin care center.  Patient diagnosed with hidradenitis suppurativa.  Patient placed on clindamycin solution on doxycycline.

## 2020-05-27 ENCOUNTER — Other Ambulatory Visit: Payer: Self-pay

## 2020-05-27 DIAGNOSIS — I10 Essential (primary) hypertension: Secondary | ICD-10-CM

## 2020-05-27 MED ORDER — AMLODIPINE BESYLATE 5 MG PO TABS: 5.0000 mg | ORAL_TABLET | Freq: Every day | ORAL | 1 refills | Status: DC

## 2020-06-09 ENCOUNTER — Encounter: Payer: Self-pay | Admitting: Internal Medicine

## 2020-06-09 DIAGNOSIS — I10 Essential (primary) hypertension: Secondary | ICD-10-CM

## 2020-06-10 MED ORDER — AMLODIPINE BESYLATE 5 MG PO TABS: 5.0000 mg | ORAL_TABLET | Freq: Every day | ORAL | 1 refills | Status: DC

## 2020-06-10 MED ORDER — ATORVASTATIN CALCIUM 10 MG PO TABS: 10.0000 mg | ORAL_TABLET | Freq: Every day | ORAL | 1 refills | Status: DC

## 2020-06-16 ENCOUNTER — Ambulatory Visit: Payer: Self-pay | Admitting: Surgery

## 2020-06-16 NOTE — H&P (Signed)
Karlissa Kussman Appointment: 06/16/2020 11:00 AM Location: [facility] Patient #: [id number] DOB: 1971-11-22 Single / Language: Misty Bailey / Race: Black or African American Female  History of Present Illness Misty Bailey A. Misty Geier MD; 06/16/2020 12:28 PM) Patient words: Patient sent at the request of Dr. Wynetta Emery for left inguinal bulge. She's had it for at least a year. Location his left groin region and the bulge pops in and pops out. She has mild to moderate discomfort. No change in bowel or bladder function. No redness, fever or changes noted to the left groin. Does have a history of MRSA boils and she initially thought this was a boil.  The patient is a 49 year old female.   Past Surgical History Misty Bailey, [place]; 06/16/2020 10:53 AM) Foot Surgery Left. Oral Surgery  Diagnostic Studies History Misty Bailey, [place]; 06/16/2020 10:53 AM) Colonoscopy 1-5 years ago Mammogram within last year Pap Smear 1-5 years ago  Allergies Misty Bailey, RMA; 06/16/2020 11:10 AM) Codeine and Related  Medication History Misty Bailey, RMA; 06/16/2020 11:11 AM) amLODIPine Besylate (5MG  Tablet, Oral) Active. Atorvastatin Calcium (10MG  Tablet, Oral) Active. metroNIDAZOLE (0.75% Gel, External) Active. Albuterol Sulfate HFA (108 (90 Base)MCG/ACT Aerosol Soln, Inhalation) Active. Mupirocin (2% Ointment, External) Active.  Social History Misty Bailey, [place]; 06/16/2020 10:53 AM) Alcohol use Occasional alcohol use. Caffeine use Tea. Illicit drug use Prefer to discuss with provider. Tobacco use Former smoker.  Family History Misty Bailey, [place]; 06/16/2020 10:53 AM) Alcohol Abuse Father. Cerebrovascular Accident Father. Diabetes Mellitus Mother, Sister. Hypertension Father, Mother.  Pregnancy / Birth History Misty Bailey, [place]; 06/16/2020 10:53 AM) Age at menarche 79 years. Gravida 2 Maternal age 5-20 Para 0 Regular periods  Other Problems Misty Bailey, [place];  06/16/2020 10:53 AM) Anxiety Disorder Asthma Chest pain Depression Diverticulosis High blood pressure Hypercholesterolemia Inguinal Hernia     Review of Systems Misty Bailey RMA; 06/16/2020 10:54 AM) General Present- Night Sweats and Weight Gain. Not Present- Appetite Loss, Chills, Fatigue, Fever and Weight Loss. HEENT Present- Seasonal Allergies and Wears glasses/contact lenses. Not Present- Earache, Hearing Loss, Hoarseness, Nose Bleed, Oral Ulcers, Ringing in the Ears, Sinus Pain, Sore Throat, Visual Disturbances and Yellow Eyes. Cardiovascular Present- Chest Pain. Not Present- Difficulty Breathing Lying Down, Leg Cramps, Palpitations, Rapid Heart Rate, Shortness of Breath and Swelling of Extremities. Gastrointestinal Present- Change in Bowel Habits. Not Present- Abdominal Pain, Bloating, Bloody Stool, Chronic diarrhea, Constipation, Difficulty Swallowing, Excessive gas, Gets full quickly at meals, Hemorrhoids, Indigestion, Nausea, Rectal Pain and Vomiting. Female Genitourinary Not Present- Frequency, Nocturia, Painful Urination, Pelvic Pain and Urgency. Musculoskeletal Not Present- Back Pain, Joint Pain, Joint Stiffness, Muscle Pain, Muscle Weakness and Swelling of Extremities. Neurological Not Present- Decreased Memory, Fainting, Headaches, Numbness, Seizures, Tingling, Tremor, Trouble walking and Weakness. Psychiatric Present- Anxiety. Not Present- Bipolar, Change in Sleep Pattern, Depression, Fearful and Frequent crying. Endocrine Not Present- Cold Intolerance, Excessive Hunger, Hair Changes, Heat Intolerance, Hot flashes and New Diabetes. Hematology Not Present- Blood Thinners, Easy Bruising, Excessive bleeding, Gland problems, HIV and Persistent Infections.  Vitals Misty Bailey RMA; 06/16/2020 11:12 AM) 06/16/2020 11:11 AM Weight: 135.13 lb Height: 61in Body Surface Area: 1.6 m Body Mass Index: 25.53 kg/m  Temp.: 97.69F  Pulse: 74 (Regular)  BP:  140/82(Sitting, Left Arm, Standard)        Physical Exam (Cass Edinger A. Ivett Luebbe MD; 06/16/2020 12:29 PM)  General Mental Status-Alert. General Appearance-Consistent with stated age. Hydration-Well hydrated. Voice-Normal.  Chest and Lung Exam Chest and lung exam reveals -quiet, even and easy respiratory effort  with no use of accessory muscles and on auscultation, normal breath sounds, no adventitious sounds and normal vocal resonance. Inspection Chest Wall - Normal. Back - normal.  Cardiovascular Cardiovascular examination reveals -normal heart sounds, regular rate and rhythm with no murmurs and normal pedal pulses bilaterally.  Abdomen Inspection Inspection of the abdomen reveals - No Hernias. Skin - Scar - no surgical scars. Palpation/Percussion Palpation and Percussion of the abdomen reveal - Soft, Non Tender, No Rebound tenderness, No Rigidity (guarding) and No hepatosplenomegaly. Auscultation Auscultation of the abdomen reveals - Bowel sounds normal. Note: Reducible left inguinal hernia noted. No evidence of guarding or hernia.  Neurologic Neurologic evaluation reveals -alert and oriented x 3 with no impairment of recent or remote memory. Mental Status-Normal.  Musculoskeletal Normal Exam - Left-Upper Extremity Strength Normal and Lower Extremity Strength Normal. Normal Exam - Right-Upper Extremity Strength Normal and Lower Extremity Strength Normal.  Lymphatic Head & Neck  General Head & Neck Lymphatics: Bilateral - Description - Normal. Axillary  General Axillary Region: Bilateral - Description - Normal. Tenderness - Non Tender.    Assessment & Plan ([name] A. [name] MD; 06/16/2020 12:33 PM)  LEFT INGUINAL HERNIA (K40.90) Impression: Reducible left inguinal hernia. Discussed repair with the use of mesh. Discussed observation. She is agreed for repair of her left inguinal hernia with mesh. The risk of hernia repair include bleeding,  infection, organ injury, bowel injury, bladder injury, nerve injury recurrent hernia, blood clots, worsening of underlying condition, chronic pain, mesh use, open surgery, death, and the need for other operations. Pt agrees to proceed  total time 45 minutes  Current Plans Pt Education - Consent for inguinal hernia - [place]: discussed with patient and provided information. Pt Education - CCS Mesh education: discussed with patient and provided information. Pt Education - Pamphlet Given - Hernia Surgery: discussed with patient and provided information. You are being scheduled for surgery- Our schedulers will call you.  You should hear from our office's scheduling department within 5 working days about the location, date, and time of surgery. We try to make accommodations for patient's preferences in scheduling surgery, but sometimes the OR schedule or the surgeon's schedule prevents [place] from making those accommodations.  If you have not heard from our office [phone]) in 5 working days, call the office and ask for your surgeon's nurse.  If you have other questions about your diagnosis, plan, or surgery, call the office and ask for your surgeon's nurse.

## 2020-06-22 ENCOUNTER — Encounter: Payer: BC Managed Care – PPO | Admitting: Obstetrics and Gynecology

## 2020-07-30 ENCOUNTER — Encounter: Payer: Self-pay | Admitting: Obstetrics and Gynecology

## 2020-07-30 ENCOUNTER — Encounter: Payer: BC Managed Care – PPO | Admitting: Obstetrics and Gynecology

## 2020-08-01 NOTE — Progress Notes (Signed)
Patient did not keep her GYN referral appointment for 07/30/2020.  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

## 2020-09-03 ENCOUNTER — Ambulatory Visit: Payer: Self-pay | Attending: Internal Medicine | Admitting: Internal Medicine

## 2020-09-03 DIAGNOSIS — Z114 Encounter for screening for human immunodeficiency virus [HIV]: Secondary | ICD-10-CM

## 2020-09-03 DIAGNOSIS — J988 Other specified respiratory disorders: Secondary | ICD-10-CM

## 2020-09-03 DIAGNOSIS — L732 Hidradenitis suppurativa: Secondary | ICD-10-CM

## 2020-09-03 DIAGNOSIS — E782 Mixed hyperlipidemia: Secondary | ICD-10-CM

## 2020-09-03 DIAGNOSIS — Z23 Encounter for immunization: Secondary | ICD-10-CM

## 2020-09-03 DIAGNOSIS — J9801 Acute bronchospasm: Secondary | ICD-10-CM

## 2020-09-03 DIAGNOSIS — B9789 Other viral agents as the cause of diseases classified elsewhere: Secondary | ICD-10-CM

## 2020-09-03 DIAGNOSIS — I10 Essential (primary) hypertension: Secondary | ICD-10-CM

## 2020-09-03 MED ORDER — CETIRIZINE HCL 10 MG PO CAPS: 10.0000 mg | ORAL_CAPSULE | Freq: Every day | ORAL | 0 refills | Status: DC | PRN

## 2020-09-03 MED ORDER — ALBUTEROL SULFATE HFA 108 (90 BASE) MCG/ACT IN AERS: 2.0000 | INHALATION_SPRAY | Freq: Four times a day (QID) | RESPIRATORY_TRACT | 4 refills | Status: DC | PRN

## 2020-09-03 MED ORDER — FLUTICASONE PROPIONATE 50 MCG/ACT NA SUSP: 1.0000 | Freq: Every day | NASAL | 1 refills | Status: DC

## 2020-09-03 NOTE — Progress Notes (Signed)
Virtual Visit via Telephone Note Due to current restrictions/limitations of in-office visits due to the COVID-19 pandemic, this scheduled clinical appointment was converted to a telehealth visit  I connected with Misty Bailey on 09/03/20 at 8:36 a.m by telephone and verified that I am speaking with the correct person using two identifiers. I am in my office.  The patient is at home.  Only the patient and myself participated in this encounter.  I discussed the limitations, risks, security and privacy concerns of performing an evaluation and management service by telephone and the availability of in person appointments. I also discussed with the patient that there may be a patient responsible charge related to this service. The patient expressed understanding and agreed to proceed.   History of Present Illness: Dysmenorrhea, diverticulosis, HL, HTN, hidradenitis, bronchospasm. Patient last evaluated 04/2020. Purpose of today's visit is chronic disease management.  Referred to derm Lyndle Herrlich on last visit for recurrent boils.  Dx with Hidradenitis.  Started on [place] in June.  Had 2 doses but loss insurance.  Just started new job and will get insurance again soon. Starts new position  -no boils since [employer]  HTN:  Taking the Norvasc every morning.  Has a device but has not checked BP recently.  Limits salt intake.  No LE edema.  Pt noted to have spasms of coughing and wheezing.  States she has had symptoms for 3 wks.  Symptoms include persistent cough, chest congestion,tickle in throat, little drainage at back of throat and decrease appetite.  No fever, loss of taste or smell.  SOB only when she coughs. No sick contacts and no one else in the house sick.  Pfizer COVID vaccine series completed in April -using some left over [employer] which has not helped much.  Not using inhaler.  She misplaced it about 1 mth about.  Thinks she has some Zyrtec but not using.  Out of Flonase.  HL: taking  Lipitor consistently Outpatient Encounter Medications as of 09/03/2020  Medication Sig  . albuterol (VENTOLIN HFA) 108 (90 Base) MCG/ACT inhaler Inhale 2 puffs into the lungs every 6 (six) hours as needed for wheezing or shortness of breath.  Marland Kitchen amLODipine (NORVASC) 5 MG tablet Take 1 tablet (5 mg total) by mouth daily.  Marland Kitchen atorvastatin (LIPITOR) 10 MG tablet Take 1 tablet (10 mg total) by mouth daily.  . Cetirizine HCl 10 MG CAPS Take 1 capsule (10 mg total) by mouth daily for 10 days. (Patient not taking: Reported on 03/05/2020)  . fluticasone (FLONASE) 50 MCG/ACT nasal spray Place 1-2 sprays into both nostrils daily for 7 days.  . metroNIDAZOLE (FLAGYL) 500 MG tablet Take 1 tablet (500 mg total) by mouth 2 (two) times daily.  . metroNIDAZOLE (METROGEL) 0.75 % vaginal gel Place 1 Applicatorful vaginally 2 (two) times daily.  . mupirocin ointment (BACTROBAN) 2 % Apply to affected area twice a day as needed   No facility-administered encounter medications on file as of 09/03/2020.      Observations/Objective: No direct observation done as this was a telephone visit.  However patient was noted to have significant coughing spasms and some wheezing can be heard over the phone.  Assessment and Plan: 1. Viral respiratory illness Differential diagnosis include the common cold versus breakthrough case of Covid and patient who has been vaccinated plus/minus postnasal drip.  Advised that she has Covid testing done and remain in quarantine until resulted.  If negative she does not have to remain in quarantine.  Refill sent on Flonase nasal spray, Zyrtec and albuterol inhaler.  Advised to continue the [employer] or she can use Coricidin HBP over-the-counter. - Novel Coronavirus, NAA ([facility]) - fluticasone (FLONASE) 50 MCG/ACT nasal spray; Place 1 spray into both nostrils daily.  Dispense: 9.9 mL; Refill: 1 - Cetirizine HCl 10 MG CAPS; Take 1 capsule (10 mg total) by mouth daily as needed.  Dispense: 30  capsule; Refill: 0  2. Essential hypertension Continue amlodipine.  Advised to check blood pressure at least once a week with goal being 130/80 or lower. - CBC; Future - Comprehensive metabolic panel; Future  3. Hidradenitis suppurativa Followed by dermatology.  On Humira.  4. Mixed hyperlipidemia Continue atorvastatin - Lipid panel; Future  5. Bronchospasm Refill albuterol inhaler. - albuterol (VENTOLIN HFA) 108 (90 Base) MCG/ACT inhaler; Inhale 2 puffs into the lungs every 6 (six) hours as needed for wheezing or shortness of breath.  Dispense: 8 g; Refill: 4  6. Screening for HIV (human immunodeficiency virus) Showing up on her health maintenance is needing to be done.  Patient is agreeable to screening. - HIV Antibody (routine testing w rflx); Future  7. Need for influenza vaccination Follow-up with our clinical pharmacist in 2 weeks for flu shot.  She will stop at the lab at that time to have her blood test done.   Follow Up Instructions: 4 mths   I discussed the assessment and treatment plan with the patient. The patient was provided an opportunity to ask questions and all were answered. The patient agreed with the plan and demonstrated an understanding of the instructions.   The patient was advised to call back or seek an in-person evaluation if the symptoms worsen or if the condition fails to improve as anticipated.  I provided 17 minutes of non-face-to-face time during this encounter.   Karle Plumber, MD

## 2020-09-04 ENCOUNTER — Other Ambulatory Visit: Payer: Self-pay

## 2020-09-04 DIAGNOSIS — Z20822 Contact with and (suspected) exposure to covid-19: Secondary | ICD-10-CM

## 2020-09-07 LAB — NOVEL CORONAVIRUS, NAA: SARS-CoV-2, NAA: NOT DETECTED

## 2020-09-17 ENCOUNTER — Ambulatory Visit: Payer: Self-pay | Admitting: Pharmacist

## 2020-09-27 ENCOUNTER — Other Ambulatory Visit: Payer: Self-pay | Admitting: Internal Medicine

## 2020-09-27 DIAGNOSIS — J988 Other specified respiratory disorders: Secondary | ICD-10-CM

## 2020-09-27 DIAGNOSIS — B9789 Other viral agents as the cause of diseases classified elsewhere: Secondary | ICD-10-CM

## 2020-09-27 NOTE — Telephone Encounter (Signed)
Requested Prescriptions  Pending Prescriptions Disp Refills   cetirizine (ZYRTEC) 10 MG tablet [Pharmacy Med Name: CETIRIZINE HCL 10 MG TABLET] 90 tablet 3    Sig: TAKE 1 CAPSULE (10 MG TOTAL) BY MOUTH DAILY AS NEEDED.     Ear, Nose, and Throat:  Antihistamines Passed - 09/27/2020  3:48 AM      Passed - Valid encounter within last 12 months    Recent Outpatient Visits          3 weeks ago Viral respiratory illness   [place], MD   5 months ago Essential hypertension   [place], MD   10 months ago Patient left before evaluation by physician   [place], Deborah B, MD   1 year ago Syncope, unspecified syncope type   [place], Deborah B, MD   2 years ago [place], Deborah B, MD      Future Appointments            In 3 weeks Tresa Endo, [place]   In 3 months Wynetta Emery, Dalbert Batman, MD [place]

## 2020-09-30 ENCOUNTER — Other Ambulatory Visit: Payer: Self-pay | Admitting: Internal Medicine

## 2020-09-30 DIAGNOSIS — J988 Other specified respiratory disorders: Secondary | ICD-10-CM

## 2020-09-30 DIAGNOSIS — B9789 Other viral agents as the cause of diseases classified elsewhere: Secondary | ICD-10-CM

## 2020-10-23 ENCOUNTER — Ambulatory Visit: Payer: Self-pay | Admitting: Pharmacist

## 2020-11-12 ENCOUNTER — Ambulatory Visit (HOSPITAL_COMMUNITY)
Admission: RE | Admit: 2020-11-12 | Discharge: 2020-11-12 | Disposition: A | Discharge status: Home / Self Care | Payer: Self-pay | Source: Ambulatory Visit | Attending: Family Medicine | Admitting: Family Medicine

## 2020-11-12 ENCOUNTER — Other Ambulatory Visit: Payer: Self-pay

## 2020-11-12 ENCOUNTER — Encounter (HOSPITAL_COMMUNITY): Payer: Self-pay

## 2020-11-12 VITALS — BP 135/85 | HR 74 | Temp 99.3°F | Resp 17

## 2020-11-12 DIAGNOSIS — S39012A Strain of muscle, fascia and tendon of lower back, initial encounter: Secondary | ICD-10-CM | POA: Insufficient documentation

## 2020-11-12 DIAGNOSIS — R21 Rash and other nonspecific skin eruption: Secondary | ICD-10-CM | POA: Insufficient documentation

## 2020-11-12 DIAGNOSIS — L309 Dermatitis, unspecified: Secondary | ICD-10-CM | POA: Insufficient documentation

## 2020-11-12 LAB — HIV ANTIBODY (ROUTINE TESTING W REFLEX): HIV Screen 4th Generation wRfx: NONREACTIVE

## 2020-11-12 MED ORDER — CYCLOBENZAPRINE HCL 10 MG PO TABS: 10.0000 mg | ORAL_TABLET | Freq: Every day | ORAL | 0 refills | Status: DC

## 2020-11-12 MED ORDER — KETOROLAC TROMETHAMINE 60 MG/2ML IM SOLN
60.0000 mg | Freq: Once | INTRAMUSCULAR | Status: AC
  Administered 2020-11-12: 60 mg via INTRAMUSCULAR

## 2020-11-12 MED ORDER — METHYLPREDNISOLONE SODIUM SUCC 125 MG IJ SOLR
INTRAMUSCULAR | Status: AC
  Filled 2020-11-12: qty 2

## 2020-11-12 MED ORDER — PREDNISONE 20 MG PO TABS: 40.0000 mg | ORAL_TABLET | Freq: Every day | ORAL | 0 refills | Status: DC

## 2020-11-12 MED ORDER — KETOROLAC TROMETHAMINE 60 MG/2ML IM SOLN
INTRAMUSCULAR | Status: AC
  Filled 2020-11-12: qty 2

## 2020-11-12 MED ORDER — METHYLPREDNISOLONE SODIUM SUCC 125 MG IJ SOLR
125.0000 mg | Freq: Once | INTRAMUSCULAR | Status: AC
  Administered 2020-11-12: 125 mg via INTRAMUSCULAR

## 2020-11-12 MED ORDER — TRIAMCINOLONE ACETONIDE 0.1 % EX CREA: 1.0000 "application " | TOPICAL_CREAM | Freq: Three times a day (TID) | CUTANEOUS | 0 refills | Status: DC

## 2020-11-12 NOTE — ED Triage Notes (Signed)
Pt presents with multiple symptoms; pt complains of bilateral ear fullness, fatigue, rash in different areas of body from unknown source, back pain, and ongoing headache X 1 week.

## 2020-11-12 NOTE — Discharge Instructions (Signed)
Your results will be available via MyChart within the next 6 to 12 hours or less. We will notify you of any abnormal findings via phone. For now treating her back pain and rash with with a steroid injection given here in clinic today which is Solu-Medrol and Toradol. You will start oral prednisone tomorrow 40 mg once daily with breakfast. I have also sent over cyclobenzaprine which is a muscle relaxer for you to take at bedtime. Only take medication at bedtime as it causes severe drowsiness although it will help with back pain. Of also sent over the triamcinolone cream for you apply to areas of your body which the rash is present. Mix steroid cream with petroleum jelly to moisturize skin.

## 2020-11-12 NOTE — ED Provider Notes (Addendum)
Misty Bailey    CSN: [id number] Arrival date & time: 11/12/20  1700      History   Chief Complaint Chief Complaint  Patient presents with  . Appointment  . Headache  . Back Pain  . Rash    HPI Misty Bailey is a 49 y.o. female.   HPI                                                                                                                             presents with 2 weeks of a generalized pruritic rash that has now expanded to include the soles of her hands and feet.  Patient reports that some of the papules have filled with pus which she is able to express.  She has been afebrile and denies any recent illness.  She also has a macular papular-like eruption under both of her breast and in the area of her perineal region.  She has a history of hydradenitis   and was recently treated with Humira however medication was discontinued after she lost her health insurance. She reports his current rashes not similar to previous eruptions of hydradenitis.  She tried treating current rash with OTC hydrocortisone cream without any improvement of symptoms.                                                                                                                                             Past Medical History:  Diagnosis Date  . Anemia   . Arthritis 02/2014   Rib Cage  . Depression   . Dysmenorrhea 05/08/2018  . Essential hypertension 07/29/2019  . Panic attacks   . Suppurative hidradenitis     Patient Active Problem List   Diagnosis Date Noted  . Inguinal hernia of left side without obstruction or gangrene 04/30/2020  . Hyperlipidemia 07/30/2019  . Syncope 07/29/2019  . Essential hypertension 07/29/2019  . Dysmenorrhea 05/08/2018  . Herpes 03/21/2014  . Suppurative hidradenitis   . Depression     Past Surgical History:  Procedure Laterality Date  . FOOT SURGERY    . PELVIC LAPAROSCOPY  1998/1999 `   pelvic adhesions and pain    OB History    Gravida  2    Para  1   Term      Preterm  1  AB  1   Living  0     SAB  1   TAB      Ectopic      Multiple      Live Births               Home Medications    Prior to Admission medications   Medication Sig Start Date End Date Taking? Authorizing Provider  albuterol (VENTOLIN HFA) 108 (90 Base) MCG/ACT inhaler Inhale 2 puffs into the lungs every 6 (six) hours as needed for wheezing or shortness of breath. 09/03/20   Ladell Pier, MD  amLODipine (NORVASC) 5 MG tablet Take 1 tablet (5 mg total) by mouth daily. 06/10/20   Ladell Pier, MD  atorvastatin (LIPITOR) 10 MG tablet Take 1 tablet (10 mg total) by mouth daily. 06/10/20   Ladell Pier, MD  cetirizine (ZYRTEC) 10 MG tablet TAKE 1 CAPSULE (10 MG TOTAL) BY MOUTH DAILY AS NEEDED. 09/27/20   Ladell Pier, MD  fluticasone (FLONASE) 50 MCG/ACT nasal spray Place 1 spray into both nostrils daily. 09/03/20   Ladell Pier, MD  mupirocin ointment Drue Stager) 2 % Apply to affected area twice a day as needed 04/30/20   Ladell Pier, MD    Family History Family History  Problem Relation Age of Onset  . Diabetes Mother   . Hypertension Mother   . Hyperlipidemia Mother   . Heart murmur Mother   . Hypertension Father   . Stroke Father   . Hyperlipidemia Father   . Hypertension Maternal Grandmother   . Diabetes Maternal Grandmother   . Osteoporosis Maternal Grandmother   . Thyroid disease Paternal Aunt   . Breast cancer Neg Hx     Social History Social History   Tobacco Use  . Smoking status: Former Research scientist (life sciences)  . Smokeless tobacco: Current User  . Tobacco comment: Weed  Vaping Use  . Vaping Use: Never used  Substance Use Topics  . Alcohol use: Yes    Alcohol/week: 0.0 standard drinks    Comment: Occassionally   . Drug use: No     Allergies   Patient has no known allergies. Review of Systems Review of Systems Pertinent negatives listed in HPI Physical Exam Triage Vital Signs ED Triage  Vitals  Enc Vitals Group     BP 11/12/20 1730 135/85     Pulse Rate 11/12/20 1730 74     Resp 11/12/20 1730 17     Temp 11/12/20 1730 99.3 F (37.4 C)     Temp Source 11/12/20 1730 Oral     SpO2 11/12/20 1730 98 %     Weight --      Height --      Head Circumference --      Peak Flow --      Pain Score 11/12/20 1729 8     Pain Loc --      Pain Edu? --      Excl. in [place]? --    No data found.  Updated Vital Signs BP 135/85 (BP Location: Right Arm)   Pulse 74   Temp 99.3 F (37.4 C) (Oral)   Resp 17   SpO2 98%   Visual Acuity Right Eye Distance:   Left Eye Distance:   Bilateral Distance:    Right Eye Near:   Left Eye Near:    Bilateral Near:     Physical Exam Constitutional:      Appearance: She is not ill-appearing.  Cardiovascular:     Rate and Rhythm: Normal rate.  Musculoskeletal:     Thoracic back: No deformity. Decreased range of motion.     Lumbar back: No edema, deformity, tenderness or bony tenderness. Decreased range of motion. Positive right straight leg raise test and positive left straight leg raise test.  Skin:    General: Skin is warm.     Findings: Rash present. Rash is macular, papular and pustular.  Neurological:     Mental Status: She is alert.  Psychiatric:        Attention and Perception: Attention and perception normal.        Speech: Speech normal.        Behavior: Behavior normal.      UC Treatments / Results  Labs (all labs ordered are listed, but only abnormal results are displayed) Labs Reviewed - No data to display  EKG   Radiology No results found.  Procedures Procedures (including critical care time)  Medications Ordered in UC Medications - No data to display  Initial Impression / Assessment and Plan / UC Course  I have reviewed the triage vital signs and the nursing notes.  Pertinent labs & imaging results that were available during my care of the patient were reviewed by me and considered in my medical decision  making (see chart for details).     Rash appearance of the hands and feet are slightly different compared plaque/macular type rash affecting the breast peritoneal region thighs and legs.  Will check a RPR to ensure that palmar rash is not related to a secondary syphilis infection.  Also will checking for HIV. Trial topical steroid cream and prednisone. Patient is also having some back pain will prescribe cyclobenzaprine.  Recommend after eruption occurs again further blood work and work-up by PCP.  Patient verbalized understanding and agreement with plan.  Final Clinical Impressions(s) / UC Diagnoses   Final diagnoses:  Dermatitis  Rash of both hands, palmar  Rash of both feet  Strain of lumbar region, initial encounter     Discharge Instructions     Your results will be available via MyChart within the next 6 to 12 hours or less. We will notify you of any abnormal findings via phone. For now treating her back pain and rash with with a steroid injection given here in clinic today which is Solu-Medrol and Toradol. You will start oral prednisone tomorrow 40 mg once daily with breakfast. I have also sent over cyclobenzaprine which is a muscle relaxer for you to take at bedtime. Only take medication at bedtime as it causes severe drowsiness although it will help with back pain. Of also sent over the triamcinolone cream for you apply to areas of your body which the rash is present. Mix steroid cream with petroleum jelly to moisturize skin.     ED Prescriptions    Medication Sig Dispense Auth. Provider   predniSONE (DELTASONE) 20 MG tablet Take 2 tablets (40 mg total) by mouth daily with breakfast. 10 tablet Scot Jun, FNP   triamcinolone (KENALOG) 0.1 % Apply 1 application topically 3 (three) times daily. 454 g Scot Jun, FNP   cyclobenzaprine (FLEXERIL) 10 MG tablet Take 1 tablet (10 mg total) by mouth at bedtime. 30 tablet Scot Jun, FNP     PDMP not reviewed  this encounter.   Scot Jun, FNP 11/16/20 1855    Scot Jun, FNP 11/16/20 Einar Crow

## 2020-11-13 LAB — RPR: RPR Ser Ql: NONREACTIVE

## 2020-12-08 ENCOUNTER — Ambulatory Visit (HOSPITAL_COMMUNITY)
Admission: RE | Admit: 2020-12-08 | Discharge: 2020-12-08 | Disposition: A | Discharge status: Home / Self Care | Payer: Self-pay | Source: Ambulatory Visit | Attending: Urgent Care | Admitting: Urgent Care

## 2020-12-08 ENCOUNTER — Other Ambulatory Visit: Payer: Self-pay

## 2020-12-08 ENCOUNTER — Encounter (HOSPITAL_COMMUNITY): Payer: Self-pay

## 2020-12-08 VITALS — BP 142/63 | HR 67 | Temp 99.8°F | Resp 28

## 2020-12-08 DIAGNOSIS — Z20822 Contact with and (suspected) exposure to covid-19: Secondary | ICD-10-CM | POA: Insufficient documentation

## 2020-12-08 DIAGNOSIS — N898 Other specified noninflammatory disorders of vagina: Secondary | ICD-10-CM | POA: Insufficient documentation

## 2020-12-08 DIAGNOSIS — R102 Pelvic and perineal pain: Secondary | ICD-10-CM | POA: Insufficient documentation

## 2020-12-08 DIAGNOSIS — N730 Acute parametritis and pelvic cellulitis: Secondary | ICD-10-CM | POA: Insufficient documentation

## 2020-12-08 LAB — POCT URINALYSIS DIPSTICK, ED / UC
Glucose, UA: NEGATIVE mg/dL
Ketones, ur: 15 mg/dL — AB
Leukocytes,Ua: NEGATIVE
Nitrite: NEGATIVE
Protein, ur: >=300 mg/dL — AB
Specific Gravity, Urine: >=1.03 (ref 1.005–1.030)
Urobilinogen, UA: 0.2 mg/dL (ref 0.0–1.0)
pH: 6 (ref 5.0–8.0)

## 2020-12-08 MED ORDER — NAPROXEN 500 MG PO TABS: 500.0000 mg | ORAL_TABLET | Freq: Two times a day (BID) | ORAL | 0 refills | Status: DC

## 2020-12-08 MED ORDER — ONDANSETRON HCL 4 MG/2ML IJ SOLN
INTRAMUSCULAR | Status: AC
  Filled 2020-12-08: qty 2

## 2020-12-08 MED ORDER — CEFTRIAXONE SODIUM 500 MG IJ SOLR
500.0000 mg | Freq: Once | INTRAMUSCULAR | Status: AC
  Administered 2020-12-08: 500 mg via INTRAMUSCULAR

## 2020-12-08 MED ORDER — DOXYCYCLINE HYCLATE 100 MG PO CAPS: 100.0000 mg | ORAL_CAPSULE | Freq: Two times a day (BID) | ORAL | 0 refills | Status: DC

## 2020-12-08 MED ORDER — CEFTRIAXONE SODIUM 500 MG IJ SOLR
INTRAMUSCULAR | Status: AC
  Filled 2020-12-08: qty 500

## 2020-12-08 MED ORDER — ONDANSETRON HCL 4 MG/2ML IJ SOLN
4.0000 mg | Freq: Once | INTRAMUSCULAR | Status: AC
  Administered 2020-12-08: 4 mg via INTRAMUSCULAR

## 2020-12-08 MED ORDER — ONDANSETRON 8 MG PO TBDP: 8.0000 mg | ORAL_TABLET | Freq: Three times a day (TID) | ORAL | 0 refills | Status: DC | PRN

## 2020-12-08 MED ORDER — LIDOCAINE HCL (PF) 1 % IJ SOLN
INTRAMUSCULAR | Status: AC
  Filled 2020-12-08: qty 2

## 2020-12-08 MED ORDER — TINIDAZOLE 500 MG PO TABS: 2.0000 g | ORAL_TABLET | Freq: Every day | ORAL | 0 refills | Status: DC

## 2020-12-08 NOTE — ED Provider Notes (Signed)
[facility]   MRN: [id number] DOB: 09-07-1971  Subjective:   Misty Bailey is a 49 y.o. female presenting for 2-day history of acute onset severe pelvic pain across her entire pelvic region that is worsening and now causing vomiting.  She develops vaginal discharge this morning.  Has a history of PID, trichomonas in 2020.  She is still sexually active with the same partner, does not use condoms for protection.  Denies fever, cough, chest pain, dysuria, urinary frequency.  Has had a difficult time eating anything because of her pain.  She has taken sips of fluids, has drunk Ensure.  No current facility-administered medications for this encounter.  Current Outpatient Medications:  .  amLODipine (NORVASC) 5 MG tablet, Take 1 tablet (5 mg total) by mouth daily., Disp: 90 tablet, Rfl: 1 .  atorvastatin (LIPITOR) 10 MG tablet, Take 1 tablet (10 mg total) by mouth daily., Disp: 90 tablet, Rfl: 1 .  albuterol (VENTOLIN HFA) 108 (90 Base) MCG/ACT inhaler, Inhale 2 puffs into the lungs every 6 (six) hours as needed for wheezing or shortness of breath., Disp: 8 g, Rfl: 4 .  cetirizine (ZYRTEC) 10 MG tablet, TAKE 1 CAPSULE (10 MG TOTAL) BY MOUTH DAILY AS NEEDED., Disp: 90 tablet, Rfl: 3 .  cyclobenzaprine (FLEXERIL) 10 MG tablet, Take 1 tablet (10 mg total) by mouth at bedtime., Disp: 30 tablet, Rfl: 0 .  fluticasone (FLONASE) 50 MCG/ACT nasal spray, Place 1 spray into both nostrils daily., Disp: 9.9 mL, Rfl: 1 .  mupirocin ointment (BACTROBAN) 2 %, Apply to affected area twice a day as needed, Disp: 22 g, Rfl: 0 .  predniSONE (DELTASONE) 20 MG tablet, Take 2 tablets (40 mg total) by mouth daily with breakfast., Disp: 10 tablet, Rfl: 0 .  triamcinolone (KENALOG) 0.1 %, Apply 1 application topically 3 (three) times daily., Disp: 454 g, Rfl: 0   No Known Allergies  Past Medical History:  Diagnosis Date  . Anemia   . Arthritis 02/2014   Rib Cage  . Depression   . Dysmenorrhea  05/08/2018  . Essential hypertension 07/29/2019  . Panic attacks   . Suppurative hidradenitis      Past Surgical History:  Procedure Laterality Date  . FOOT SURGERY    . PELVIC LAPAROSCOPY  1998/1999 `   pelvic adhesions and pain    Family History  Problem Relation Age of Onset  . Diabetes Mother   . Hypertension Mother   . Hyperlipidemia Mother   . Heart murmur Mother   . Hypertension Father   . Stroke Father   . Hyperlipidemia Father   . Hypertension Maternal Grandmother   . Diabetes Maternal Grandmother   . Osteoporosis Maternal Grandmother   . Thyroid disease Paternal Aunt   . Breast cancer Neg Hx     Social History   Tobacco Use  . Smoking status: Former Research scientist (life sciences)  . Smokeless tobacco: Current User  . Tobacco comment: Weed  Vaping Use  . Vaping Use: Never used  Substance Use Topics  . Alcohol use: Yes    Alcohol/week: 0.0 standard drinks    Comment: Occassionally   . Drug use: No    ROS   Objective:   Vitals: BP (!) 142/63 (BP Location: Left Arm)   Pulse 67   Temp 99.8 F (37.7 C) (Oral)   Resp (!) 28   SpO2 100%   Physical Exam Exam conducted with a chaperone present Sport and exercise psychologist).  Constitutional:  General: She is not in acute distress.    Appearance: Normal appearance. She is well-developed and normal weight. She is not ill-appearing, toxic-appearing or diaphoretic.  HENT:     Head: Normocephalic and atraumatic.     Right Ear: External ear normal.     Left Ear: External ear normal.     Nose: Nose normal.     Mouth/Throat:     Mouth: Mucous membranes are moist.     Pharynx: Oropharynx is clear.  Eyes:     General: No scleral icterus.    Extraocular Movements: Extraocular movements intact.     Pupils: Pupils are equal, round, and reactive to light.  Cardiovascular:     Rate and Rhythm: Normal rate and regular rhythm.     Heart sounds: Normal heart sounds. No murmur heard. No friction rub. No gallop.   Pulmonary:     Effort: Pulmonary  effort is normal. No respiratory distress.     Breath sounds: Normal breath sounds. No stridor. No wheezing, rhonchi or rales.  Abdominal:     General: Bowel sounds are normal. There is no distension.     Palpations: Abdomen is soft. There is no mass.     Tenderness: There is abdominal tenderness in the right lower quadrant, periumbilical area, suprapubic area and left lower quadrant. There is guarding. There is no right CVA tenderness, left CVA tenderness or rebound.  Genitourinary:    Labia:        Right: No rash, tenderness, lesion or injury.        Left: No rash, tenderness, lesion or injury.      Cervix: Cervical motion tenderness, discharge, erythema and eversion present. No friability, lesion or cervical bleeding.  Skin:    General: Skin is warm and dry.     Coloration: Skin is not pale.     Findings: No rash.  Neurological:     General: No focal deficit present.     Mental Status: She is alert and oriented to person, place, and time.  Psychiatric:        Mood and Affect: Mood normal.        Behavior: Behavior normal.        Thought Content: Thought content normal.        Judgment: Judgment normal.     Results for orders placed or performed during the hospital encounter of 12/08/20 (from the past 24 hour(s))  POC Urinalysis dipstick     Status: Abnormal   Collection Time: 12/08/20  4:13 PM  Result Value Ref Range   Glucose, UA NEGATIVE NEGATIVE mg/dL   Bilirubin Urine SMALL (A) NEGATIVE   Ketones, ur 15 (A) NEGATIVE mg/dL   Specific Gravity, Urine >=1.030 1.005 - 1.030   Hgb urine dipstick MODERATE (A) NEGATIVE   pH 6.0 5.0 - 8.0   Protein, ur >=300 (A) NEGATIVE mg/dL   Urobilinogen, UA 0.2 0.0 - 1.0 mg/dL   Nitrite NEGATIVE NEGATIVE   Leukocytes,Ua NEGATIVE NEGATIVE    Assessment and Plan :   PDMP not reviewed this encounter.  1. PID (acute pelvic inflammatory disease)   2. Pelvic pain in female   3. Vaginal discharge     IM ceftriaxone in clinic, IM Zofran  as well for nausea and vomiting.  Doxycycline x14 days to cover for PID.  Patient is not willing to try metronidazole as it has made her really sick to her stomach previously.  Recommended tinidazole instead. Counseled patient on potential for adverse effects with  medications prescribed/recommended today, ER and return-to-clinic precautions discussed, patient verbalized understanding.    Jaynee Eagles, PA-C 12/08/20 1657

## 2020-12-08 NOTE — ED Triage Notes (Signed)
Patient started feeling bad Sunday. Patient has had fever, chills abdominal pain, vomited Sunday and Monday.  No diarrhea.

## 2020-12-08 NOTE — Discharge Instructions (Signed)
Please just use Tylenol at a dose of 500mg -650mg  once every 6 hours as needed for your aches, pains, fevers. It is okay to take this with naproxen for pain. Use Zofran (a dissolvable tablet) for nausea and vomiting. Take doxycycline for chlamydia and tinidazole for BV.

## 2020-12-09 LAB — URINE CULTURE: Culture: <10000 — AB

## 2020-12-09 LAB — CERVICOVAGINAL ANCILLARY ONLY
Bacterial Vaginitis (gardnerella): NEGATIVE
Candida Glabrata: NEGATIVE
Candida Vaginitis: NEGATIVE
Chlamydia: NEGATIVE
Comment: NEGATIVE
Comment: NEGATIVE
Comment: NEGATIVE
Comment: NEGATIVE
Comment: NEGATIVE
Comment: NORMAL
Neisseria Gonorrhea: NEGATIVE
Trichomonas: POSITIVE — AB

## 2020-12-09 LAB — SARS CORONAVIRUS 2 (TAT 6-24 HRS): SARS Coronavirus 2: NEGATIVE

## 2021-01-04 ENCOUNTER — Other Ambulatory Visit: Payer: Self-pay

## 2021-01-04 ENCOUNTER — Encounter: Payer: Self-pay | Admitting: Internal Medicine

## 2021-01-04 ENCOUNTER — Ambulatory Visit: Payer: Self-pay | Attending: Internal Medicine | Admitting: Internal Medicine

## 2021-01-04 VITALS — BP 112/70 | HR 84 | Temp 98.7°F | Resp 16 | Wt 136.6 lb

## 2021-01-04 DIAGNOSIS — A599 Trichomoniasis, unspecified: Secondary | ICD-10-CM

## 2021-01-04 DIAGNOSIS — J988 Other specified respiratory disorders: Secondary | ICD-10-CM

## 2021-01-04 DIAGNOSIS — E782 Mixed hyperlipidemia: Secondary | ICD-10-CM

## 2021-01-04 DIAGNOSIS — I10 Essential (primary) hypertension: Secondary | ICD-10-CM

## 2021-01-04 DIAGNOSIS — Z2821 Immunization not carried out because of patient refusal: Secondary | ICD-10-CM | POA: Insufficient documentation

## 2021-01-04 DIAGNOSIS — E663 Overweight: Secondary | ICD-10-CM

## 2021-01-04 DIAGNOSIS — H6123 Impacted cerumen, bilateral: Secondary | ICD-10-CM

## 2021-01-04 DIAGNOSIS — B9789 Other viral agents as the cause of diseases classified elsewhere: Secondary | ICD-10-CM

## 2021-01-04 MED ORDER — BENZONATATE 100 MG PO CAPS: 100.0000 mg | ORAL_CAPSULE | Freq: Two times a day (BID) | ORAL | 0 refills | Status: DC | PRN

## 2021-01-04 NOTE — Progress Notes (Signed)
Patient ID: Misty Bailey, female    DOB: 1971-08-26  MRN: [id number]  CC: Hospitalization Follow-up (Urgent care), Hypertension, and URI   Subjective: Misty Bailey is a 50 y.o. female who presents for chronic ds management Her concerns today include:  Dysmenorrhea, diverticulosis, HL, HTN, hidradenitis, bronchospasm.   HTN:  Compliant with med but has not taken as yet for the a.m Limits salt in foods  Over wgh: made commitment to eat less fast foods and drink more water.  Not a "big vegetable eater only likes corn, greens, cabbage."  Works from home and was walking on her 15 min breaks.  However, not lately due to cold weather.  Plans to start walking again.   HL:  Compliant with Lipitor  C/o intermittent nasal congestion since Christmas.  Currently has nasal congestion, scratchy throat.  Dry cough. No  Chest congestion, fever, loss taste/smell.  Little SOB when she coughs. No sick contacts. Using OTC cold remedy and drinks hot tea.  Takes cough drops.  Using Albuterol inhaler 1-2 x in last 2 wks.  Has had 2 shots of COVID vaccine  Tested positive for trichomonas last month.  She was treated.  States that she told her partner about it and urged him to get treated but she is not sure whether he has been treated or not.  She has not had intercourse with him since being treated.  She would like to have repeat testing to make sure that the infection has cleared.  Would like to have her ears checked.  She thinks she has significant amount of wax buildup.  If there is wax buildup she would like to have her ears flushed.  HM: no flu shot as yet.  Has been making appt for COVID booster but every time appt made she develops congestion  Patient Active Problem List   Diagnosis Date Noted  . Influenza vaccine refused 01/04/2021  . Inguinal hernia of left side without obstruction or gangrene 04/30/2020  . Hyperlipidemia 07/30/2019  . Syncope 07/29/2019  . Essential hypertension  07/29/2019  . Dysmenorrhea 05/08/2018  . Herpes 03/21/2014  . Suppurative hidradenitis   . Depression      Current Outpatient Medications on File Prior to Visit  Medication Sig Dispense Refill  . albuterol (VENTOLIN HFA) 108 (90 Base) MCG/ACT inhaler Inhale 2 puffs into the lungs every 6 (six) hours as needed for wheezing or shortness of breath. 8 g 4  . amLODipine (NORVASC) 5 MG tablet Take 1 tablet (5 mg total) by mouth daily. 90 tablet 1  . atorvastatin (LIPITOR) 10 MG tablet Take 1 tablet (10 mg total) by mouth daily. 90 tablet 1  . cetirizine (ZYRTEC) 10 MG tablet TAKE 1 CAPSULE (10 MG TOTAL) BY MOUTH DAILY AS NEEDED. 90 tablet 3  . cyclobenzaprine (FLEXERIL) 10 MG tablet Take 1 tablet (10 mg total) by mouth at bedtime. 30 tablet 0  . fluticasone (FLONASE) 50 MCG/ACT nasal spray Place 1 spray into both nostrils daily. 9.9 mL 1  . mupirocin ointment (BACTROBAN) 2 % Apply to affected area twice a day as needed 22 g 0  . naproxen (NAPROSYN) 500 MG tablet Take 1 tablet (500 mg total) by mouth 2 (two) times daily with a meal. 30 tablet 0  . ondansetron (ZOFRAN-ODT) 8 MG disintegrating tablet Take 1 tablet (8 mg total) by mouth every 8 (eight) hours as needed for nausea or vomiting. 20 tablet 0  . tinidazole (TINDAMAX) 500 MG tablet Take 4 tablets (  2,000 mg total) by mouth daily with breakfast. 28 tablet 0  . triamcinolone (KENALOG) 0.1 % Apply 1 application topically 3 (three) times daily. 454 g 0   No current facility-administered medications on file prior to visit.    No Known Allergies  Social History   Socioeconomic History  . Marital status: Single    Spouse name: Not on file  . Number of children: 0  . Years of education: Associates  . Highest education level: Not on file  Occupational History  . Occupation: Animal nutritionist: [employer]  Tobacco Use  . Smoking status: Former Research scientist (life sciences)  . Smokeless tobacco: Current User  . Tobacco comment: Weed  Vaping Use  .  Vaping Use: Never used  Substance and Sexual Activity  . Alcohol use: Yes    Alcohol/week: 0.0 standard drinks    Comment: Occassionally   . Drug use: No  . Sexual activity: Not Currently    Partners: Male    Birth control/protection: None  Other Topics Concern  . Not on file  Social History Narrative   Boyfriend incarcerated 2014-2018   Social Determinants of Health   Financial Resource Strain: Not on file  Food Insecurity: Not on file  Transportation Needs: Not on file  Physical Activity: Not on file  Stress: Not on file  Social Connections: Not on file  Intimate Partner Violence: Not on file    Family History  Problem Relation Age of Onset  . Diabetes Mother   . Hypertension Mother   . Hyperlipidemia Mother   . Heart murmur Mother   . Hypertension Father   . Stroke Father   . Hyperlipidemia Father   . Hypertension Maternal Grandmother   . Diabetes Maternal Grandmother   . Osteoporosis Maternal Grandmother   . Thyroid disease Paternal Aunt   . Breast cancer Neg Hx     Past Surgical History:  Procedure Laterality Date  . FOOT SURGERY    . PELVIC LAPAROSCOPY  1998/1999 `   pelvic adhesions and pain    ROS: Review of Systems Negative except as stated above  PHYSICAL EXAM: BP 112/70   Pulse 84   Temp 98.7 F (37.1 C)   Resp 16   Wt 136 lb 9.6 oz (62 kg)   SpO2 96%   BMI 25.81 kg/m   Wt Readings from Last 3 Encounters:  01/04/21 136 lb 9.6 oz (62 kg)  04/30/20 134 lb 12.8 oz (61.1 kg)  03/05/20 133 lb (60.3 kg)    Physical Exam  General appearance - alert, well appearing, and in no distress.  She has mild audible congestion with some coughing in my presence. Mental status - normal mood, behavior, speech, dress, motor activity, and thought processes Ears: Moderate amount of soft earwax in both ears obscuring view of the canal and tympanic membrane Nose -mild enlargement of nasal turbinate on the left.  Mouth -tonsils mildly enlarged without  erythema or exudates. Neck - supple, no significant adenopathy Chest - clear to auscultation, no wheezes, rales or rhonchi, symmetric air entry Heart - normal rate, regular rhythm, normal S1, S2, no murmurs, rubs, clicks or gallops Extremities - peripheral pulses normal, no pedal edema, no clubbing or cyanosis   CMP Latest Ref Rng & Units 07/29/2019 07/30/2017 03/20/2014  Glucose 65 - 99 mg/dL 82 114(H) 83  BUN 6 - 24 mg/dL 17 6 10   Creatinine 0.57 - 1.00 mg/dL 1.02(H) 1.07(H) 0.86  Sodium 134 - 144 mmol/L 145(H) 139 140  Potassium 3.5 - 5.2 mmol/L 4.7 4.2 4.3  Chloride 96 - 106 mmol/L 107(H) 108 108  CO2 20 - 29 mmol/L 26 24 26   Calcium 8.7 - 10.2 mg/dL 9.5 9.1 8.8  Total Protein 6.0 - 8.5 g/dL 6.2 7.3 6.3  Total Bilirubin 0.0 - 1.2 mg/dL <0.2 0.5 0.5  Alkaline Phos 39 - 117 IU/L 66 84 61  AST 0 - 40 IU/L 13 14(L) 13  ALT 0 - 32 IU/L 8 10(L) 10   Lipid Panel     Component Value Date/Time   CHOL 257 (H) 07/29/2019 1659   TRIG 121 07/29/2019 1659   HDL 51 07/29/2019 1659   CHOLHDL 5.0 (H) 07/29/2019 1659   LDLCALC 182 (H) 07/29/2019 1659    CBC    Component Value Date/Time   WBC 6.5 04/30/2020 1731   WBC 6.3 07/30/2017 1308   RBC 4.51 04/30/2020 1731   RBC 4.41 07/30/2017 1308   HGB 13.7 04/30/2020 1731   HCT 41.1 04/30/2020 1731   PLT 290 04/30/2020 1731   MCV 91 04/30/2020 1731   MCH 30.4 04/30/2020 1731   MCH 28.6 07/30/2017 1308   MCHC 33.3 04/30/2020 1731   MCHC 32.3 07/30/2017 1308   RDW 12.2 04/30/2020 1731   LYMPHSABS 2.7 01/25/2018 1106   MONOABS 0.5 08/09/2013 1045   EOSABS 0.2 01/25/2018 1106   BASOSABS 0.0 01/25/2018 1106    ASSESSMENT AND PLAN:  1. Essential hypertension At goal.  Continue amlodipine and low-salt diet - CBC; Future - Comprehensive metabolic panel; Future - Lipid panel; Future  2. Viral respiratory illness Recommend testing for COVID-19.  Recommend quarantine until results are back in about 48 hours.  She can use Coricidin HBP  over-the-counter for congestion.  Have given some cough tablets to use as needed.  She will return in 1 to 2 weeks for flu vaccine.  If Covid test is negative she will also get her COVID booster at an outside pharmacy. - Novel Coronavirus, NAA ([facility]) - benzonatate (TESSALON) 100 MG capsule; Take 1 capsule (100 mg total) by mouth 2 (two) times daily as needed for cough.  Dispense: 20 capsule; Refill: 0  3. Mixed hyperlipidemia Continue atorvastatin  4. 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  We postponed it today because of her upper respiratory symptoms.  5. Trichomonas infection Advised that her partner should also be treated prior to them having unprotected intercourse again - Cervicovaginal ancillary only  6. Overweight (BMI 25.0-29.9) Dietary counseling given.  Encouraged her to get in some form of moderate intensity exercise at least 3 to 4 days a week for 30 minutes.   Patient was given the opportunity to ask questions.  Patient verbalized understanding of the plan and was able to repeat key elements of the plan.   Orders Placed This Encounter  Procedures  . Novel Coronavirus, NAA ([facility])  . CBC  . Comprehensive metabolic panel  . Lipid panel     Requested Prescriptions   Signed Prescriptions Disp Refills  . benzonatate (TESSALON) 100 MG capsule 20 capsule 0    Sig: Take 1 capsule (100 mg total) by mouth 2 (two) times daily as needed for cough.    Return in about 4 months (around 05/04/2021) for Nurse only visit next Friday for ear irrigation and to receive flu shot.Karle Plumber, MD, FACP

## 2021-01-04 NOTE — Patient Instructions (Signed)
I have prescribed Tessalon Perles for you to use as needed for the cough. Gargle with warm water mixed with salt for the sore throat. You can use Coricidin HBP over-the-counter as needed for the congestion. You should self quarantine until we get the results back of the COVID-19 testing.  Please return in 1 to 2 weeks to get your flu shot and for blood draw as discussed.  If your COVID-19 testing is negative you should also get the COVID booster shot.

## 2021-01-05 LAB — CERVICOVAGINAL ANCILLARY ONLY
Chlamydia: NEGATIVE
Comment: NEGATIVE
Comment: NEGATIVE
Comment: NORMAL
Neisseria Gonorrhea: NEGATIVE
Trichomonas: NEGATIVE

## 2021-01-07 LAB — NOVEL CORONAVIRUS, NAA: SARS-CoV-2, NAA: NOT DETECTED

## 2021-01-07 LAB — SARS-COV-2, NAA 2 DAY TAT

## 2021-02-03 ENCOUNTER — Other Ambulatory Visit: Payer: Self-pay

## 2021-02-03 ENCOUNTER — Ambulatory Visit: Payer: Self-pay

## 2021-02-03 ENCOUNTER — Ambulatory Visit: Payer: Self-pay | Attending: Internal Medicine

## 2021-02-03 DIAGNOSIS — I10 Essential (primary) hypertension: Secondary | ICD-10-CM

## 2021-02-04 LAB — CBC
Hematocrit: 40.9 % (ref 34.0–46.6)
Hemoglobin: 13.6 g/dL (ref 11.1–15.9)
MCH: 29.2 pg (ref 26.6–33.0)
MCHC: 33.3 g/dL (ref 31.5–35.7)
MCV: 88 fL (ref 79–97)
Platelets: 296 10*3/uL (ref 150–450)
RBC: 4.65 x10E6/uL (ref 3.77–5.28)
RDW: 12.2 % (ref 11.7–15.4)
WBC: 5.3 10*3/uL (ref 3.4–10.8)

## 2021-02-04 LAB — COMPREHENSIVE METABOLIC PANEL
ALT: 11 IU/L (ref 0–32)
AST: 13 IU/L (ref 0–40)
Albumin/Globulin Ratio: 1.8 (ref 1.2–2.2)
Albumin: 4.2 g/dL (ref 3.8–4.9)
Alkaline Phosphatase: 81 IU/L (ref 44–121)
BUN/Creatinine Ratio: 13 (ref 9–23)
BUN: 13 mg/dL (ref 6–24)
Bilirubin Total: 0.4 mg/dL (ref 0.0–1.2)
CO2: 21 mmol/L (ref 20–29)
Calcium: 9.3 mg/dL (ref 8.7–10.2)
Chloride: 107 mmol/L — ABNORMAL HIGH (ref 96–106)
Creatinine, Ser: 0.97 mg/dL (ref 0.57–1.00)
GFR calc Af Amer: 78 mL/min/{1.73_m2} (ref >59)
GFR calc non Af Amer: 68 mL/min/{1.73_m2} (ref >59)
Globulin, Total: 2.3 g/dL (ref 1.5–4.5)
Glucose: 98 mg/dL (ref 65–99)
Potassium: 4.6 mmol/L (ref 3.5–5.2)
Sodium: 139 mmol/L (ref 134–144)
Total Protein: 6.5 g/dL (ref 6.0–8.5)

## 2021-02-04 LAB — LIPID PANEL
Chol/HDL Ratio: 3.8 ratio (ref 0.0–4.4)
Cholesterol, Total: 213 mg/dL — ABNORMAL HIGH (ref 100–199)
HDL: 56 mg/dL (ref >39)
LDL Chol Calc (NIH): 143 mg/dL — ABNORMAL HIGH (ref 0–99)
Triglycerides: 78 mg/dL (ref 0–149)
VLDL Cholesterol Cal: 14 mg/dL (ref 5–40)

## 2021-02-05 ENCOUNTER — Ambulatory Visit (HOSPITAL_COMMUNITY)
Admission: RE | Admit: 2021-02-05 | Discharge: 2021-02-05 | Disposition: A | Discharge status: Home / Self Care | Payer: Self-pay | Source: Ambulatory Visit | Attending: Student | Admitting: Student

## 2021-02-05 ENCOUNTER — Other Ambulatory Visit: Payer: Self-pay

## 2021-02-05 ENCOUNTER — Encounter (HOSPITAL_COMMUNITY): Payer: Self-pay

## 2021-02-05 VITALS — BP 138/85 | HR 72 | Temp 97.7°F | Resp 17

## 2021-02-05 DIAGNOSIS — Z3202 Encounter for pregnancy test, result negative: Secondary | ICD-10-CM

## 2021-02-05 DIAGNOSIS — Z8742 Personal history of other diseases of the female genital tract: Secondary | ICD-10-CM | POA: Insufficient documentation

## 2021-02-05 DIAGNOSIS — N898 Other specified noninflammatory disorders of vagina: Secondary | ICD-10-CM

## 2021-02-05 DIAGNOSIS — Z113 Encounter for screening for infections with a predominantly sexual mode of transmission: Secondary | ICD-10-CM | POA: Insufficient documentation

## 2021-02-05 DIAGNOSIS — A5901 Trichomonal vulvovaginitis: Secondary | ICD-10-CM | POA: Insufficient documentation

## 2021-02-05 LAB — POCT URINALYSIS DIPSTICK, ED / UC
Bilirubin Urine: NEGATIVE
Glucose, UA: NEGATIVE mg/dL
Hgb urine dipstick: NEGATIVE
Ketones, ur: NEGATIVE mg/dL
Leukocytes,Ua: NEGATIVE
Nitrite: NEGATIVE
Protein, ur: NEGATIVE mg/dL
Specific Gravity, Urine: >=1.03 (ref 1.005–1.030)
Urobilinogen, UA: 0.2 mg/dL (ref 0.0–1.0)
pH: 6.5 (ref 5.0–8.0)

## 2021-02-05 LAB — POC URINE PREG, ED: Preg Test, Ur: NEGATIVE

## 2021-02-05 MED ORDER — METRONIDAZOLE 500 MG PO TABS: 2000.0000 mg | ORAL_TABLET | Freq: Once | ORAL | 0 refills | Status: AC

## 2021-02-05 NOTE — Discharge Instructions (Addendum)
-  We're treating you for trichomonas today with Flagyl (Metronidazole). Take all four pills at once. Don't drink alcohol while taking this medication.  -We're also testing for BV, yeast, gonorrhea, chlamydia. We can send additional treatment for these if necessary.  -seek IMMEDIATE medical attention if worsening of abdominal pain, back pain, new fevers/chills, etc.

## 2021-02-05 NOTE — ED Triage Notes (Signed)
Pt presents with concerns of possible STD. She states she has been having lower abdominal pain x 1 week. She states she has been having soft, runny stool. She states she has had vaginal discharge x 2 days. Pt denies discomfort when urinating.

## 2021-02-05 NOTE — ED Provider Notes (Signed)
[facility]    CSN: [id number] Arrival date & time: 02/05/21  1856      History   Chief Complaint Chief Complaint  Patient presents with  . Abdominal Pain  . SEXUALLY TRANSMITTED DISEASE    HPI Misty Bailey is a 50 y.o. female presenting with abdominal pain x7 days and vaginal discharge x1 day. Of note, this patient has a history of PID due to trichomonas that was diagnosed 2 months ago. She has been sexually active with the same partner since then, and states he was only treated for trichomonas 1 week ago.  She endorses upper abdominal pain and right-sided back pain for 1 week.  Also vaginal discharge for 1 day.  She has not seen a discharge but states she can feel it. Denies external itching or irritation. Denies hematuria, dysuria, frequency, urgency, back pain, n/v/d, fevers/chills. Also endorses 2 episodes of loose stool a day for the last 3 days.  HPI  Past Medical History:  Diagnosis Date  . Anemia   . Arthritis 02/2014   Rib Cage  . Depression   . Dysmenorrhea 05/08/2018  . Essential hypertension 07/29/2019  . Panic attacks   . Suppurative hidradenitis     Patient Active Problem List   Diagnosis Date Noted  . Influenza vaccine refused 01/04/2021  . Inguinal hernia of left side without obstruction or gangrene 04/30/2020  . Hyperlipidemia 07/30/2019  . Syncope 07/29/2019  . Essential hypertension 07/29/2019  . Dysmenorrhea 05/08/2018  . Herpes 03/21/2014  . Suppurative hidradenitis   . Depression     Past Surgical History:  Procedure Laterality Date  . FOOT SURGERY    . PELVIC LAPAROSCOPY  1998/1999 `   pelvic adhesions and pain    OB History    Gravida  2   Para  1   Term      Preterm  1   AB  1   Living  0     SAB  1   IAB      Ectopic      Multiple      Live Births               Home Medications    Prior to Admission medications   Medication Sig Start Date End Date Taking? Authorizing Provider  albuterol  (VENTOLIN HFA) 108 (90 Base) MCG/ACT inhaler Inhale 2 puffs into the lungs every 6 (six) hours as needed for wheezing or shortness of breath. 09/03/20   Ladell Pier, MD  amLODipine (NORVASC) 5 MG tablet Take 1 tablet (5 mg total) by mouth daily. 06/10/20   Ladell Pier, MD  atorvastatin (LIPITOR) 10 MG tablet Take 1 tablet (10 mg total) by mouth daily. 06/10/20   Ladell Pier, MD  benzonatate (TESSALON) 100 MG capsule Take 1 capsule (100 mg total) by mouth 2 (two) times daily as needed for cough. 01/04/21   Ladell Pier, MD  cetirizine (ZYRTEC) 10 MG tablet TAKE 1 CAPSULE (10 MG TOTAL) BY MOUTH DAILY AS NEEDED. 09/27/20   Ladell Pier, MD  cyclobenzaprine (FLEXERIL) 10 MG tablet Take 1 tablet (10 mg total) by mouth at bedtime. 11/12/20   Scot Jun, FNP  fluticasone (FLONASE) 50 MCG/ACT nasal spray Place 1 spray into both nostrils daily. 09/03/20   Ladell Pier, MD  mupirocin ointment Drue Stager) 2 % Apply to affected area twice a day as needed 04/30/20   Ladell Pier, MD  naproxen (NAPROSYN) 500 MG  tablet Take 1 tablet (500 mg total) by mouth 2 (two) times daily with a meal. 12/08/20   Jaynee Eagles, PA-C  ondansetron (ZOFRAN-ODT) 8 MG disintegrating tablet Take 1 tablet (8 mg total) by mouth every 8 (eight) hours as needed for nausea or vomiting. 12/08/20   Jaynee Eagles, PA-C  tinidazole (TINDAMAX) 500 MG tablet Take 4 tablets (2,000 mg total) by mouth daily with breakfast. 12/08/20   Jaynee Eagles, PA-C  triamcinolone (KENALOG) 0.1 % Apply 1 application topically 3 (three) times daily. 11/12/20   Scot Jun, FNP    Family History Family History  Problem Relation Age of Onset  . Diabetes Mother   . Hypertension Mother   . Hyperlipidemia Mother   . Heart murmur Mother   . Hypertension Father   . Stroke Father   . Hyperlipidemia Father   . Hypertension Maternal Grandmother   . Diabetes Maternal Grandmother   . Osteoporosis Maternal Grandmother    . Thyroid disease Paternal Aunt   . Breast cancer Neg Hx     Social History Social History   Tobacco Use  . Smoking status: Former Research scientist (life sciences)  . Smokeless tobacco: Current User  . Tobacco comment: Weed  Vaping Use  . Vaping Use: Never used  Substance Use Topics  . Alcohol use: Yes    Alcohol/week: 0.0 standard drinks    Comment: Occassionally   . Drug use: No     Allergies   Patient has no known allergies.   Review of Systems Review of Systems  Constitutional: Negative for chills and fever.  HENT: Negative for sore throat.   Eyes: Negative for pain and redness.  Respiratory: Negative for shortness of breath.   Cardiovascular: Negative for chest pain.  Gastrointestinal: Positive for abdominal pain. Negative for diarrhea, nausea and vomiting.  Genitourinary: Positive for vaginal discharge. Negative for decreased urine volume, difficulty urinating, dysuria, flank pain, frequency, genital sores, hematuria, menstrual problem, pelvic pain, urgency, vaginal bleeding and vaginal pain.  Musculoskeletal: Positive for back pain.  Skin: Negative for rash.     Physical Exam Triage Vital Signs ED Triage Vitals  Enc Vitals Group     BP 02/05/21 1906 138/85     Pulse Rate 02/05/21 1906 72     Resp 02/05/21 1906 17     Temp 02/05/21 1906 97.7 F (36.5 C)     Temp Source 02/05/21 1906 Oral     SpO2 02/05/21 1906 100 %     Weight --      Height --      Head Circumference --      Peak Flow --      Pain Score 02/05/21 1905 5     Pain Loc --      Pain Edu? --      Excl. in [place]? --    No data found.  Updated Vital Signs BP 138/85 (BP Location: Right Arm)   Pulse 72   Temp 97.7 F (36.5 C) (Oral)   Resp 17   SpO2 100%   Visual Acuity Right Eye Distance:   Left Eye Distance:   Bilateral Distance:    Right Eye Near:   Left Eye Near:    Bilateral Near:     Physical Exam Vitals reviewed. Exam conducted with a chaperone present.  Constitutional:      General: She is  not in acute distress.    Appearance: Normal appearance. She is not ill-appearing.  HENT:     Head: Normocephalic and atraumatic.  Cardiovascular:     Rate and Rhythm: Normal rate and regular rhythm.     Heart sounds: Normal heart sounds.  Pulmonary:     Effort: Pulmonary effort is normal.     Breath sounds: Normal breath sounds. No wheezing, rhonchi or rales.  Abdominal:     General: Bowel sounds are normal. There is no distension.     Palpations: Abdomen is soft. There is no mass.     Tenderness: There is abdominal tenderness in the epigastric area. There is right CVA tenderness. There is no left CVA tenderness, guarding or rebound. Negative signs include Murphy's sign, Rovsing's sign and McBurney's sign.  Genitourinary:    General: Normal vulva.     Labia:        Right: No rash, tenderness, lesion or injury.        Left: No rash, tenderness, lesion or injury.      Vagina: No signs of injury and foreign body. Vaginal discharge present. No erythema, tenderness, bleeding, lesions or prolapsed vaginal walls.     Cervix: Cervical motion tenderness and discharge present. No friability, lesion, erythema, cervical bleeding or eversion.     Uterus: Normal. Not enlarged and not tender.      Adnexa: Right adnexa normal.       Right: No mass, tenderness or fullness.         Left: No mass, tenderness or fullness.       Comments: Scant thick white discharge visible in vaginal vault. Mild CMT.  Neurological:     General: No focal deficit present.     Mental Status: She is alert and oriented to person, place, and time.  Psychiatric:        Mood and Affect: Mood normal.        Behavior: Behavior normal.      UC Treatments / Results  Labs (all labs ordered are listed, but only abnormal results are displayed) Labs Reviewed  POCT URINALYSIS DIPSTICK, ED / UC  POC URINE PREG, ED  CERVICOVAGINAL ANCILLARY ONLY    EKG   Radiology No results found.  Procedures Procedures (including  critical care time)  Medications Ordered in UC Medications - No data to display  Initial Impression / Assessment and Plan / UC Course  I have reviewed the triage vital signs and the nursing notes.  Pertinent labs & imaging results that were available during my care of the patient were reviewed by me and considered in my medical decision making (see chart for details).    50 year old female presenting with abdominal pain, CVAT, vaginal discharge for few days.     This patient has a history of PID due to trichomonas diagnosed 11/2021. She has been with the same partner since then and he was only treated for trichomonas in the last week. On exam, scant white vaginal discharge and mild cervical motion tenderness. R CVAT.   Will send for G/C, trich, yeast, BV testing. Given history, plan to treat for trichomonas with flagyl as below. She is currently afebrile nontachycardic with epigastric abdominal pain only. STRICT return precautions- worsening of abd pain, new abd pain, worsening of back pain, fevers/chills, dizziness, chest pain- seek immediate medical attention.   We have sent testing for sexually transmitted infections. We will notify you of any positive results once they are received. If required, we will prescribe any medications you might need.   Please refrain from all sexual activity for at least the next seven days.  Seek additional medical  attention if you develop fevers/chills, new/worsening abdominal pain, new/worsening vaginal discomfort/discharge, etc. Patient verbalizes understanding and agreement.  Spent over 40 minutes obtaining H&P, performing physical, discussing results, treatment plan and plan for follow-up with patient. Patient agrees with plan.   This chart was dictated using voice recognition software, Dragon. Despite the best efforts of this provider to proofread and correct errors, errors may still occur which can change documentation meaning.   Final Clinical  Impressions(s) / UC Diagnoses   Final diagnoses:  Trichomonas vaginitis  Screen for STD (sexually transmitted disease)  History of PID     Discharge Instructions     -We're treating you for trichomonas today with Flagyl (Metronidazole). Take all four pills at once. Don't drink alcohol while taking this medication.  -We're also testing for BV, yeast, gonorrhea, chlamydia. We can send additional treatment for these if necessary.  -seek IMMEDIATE medical attention if worsening of abdominal pain, back pain, new fevers/chills, etc.     ED Prescriptions    Medication Sig Dispense Auth. Provider   metroNIDAZOLE (FLAGYL) 500 MG tablet Take 4 tablets (2,000 mg total) by mouth once for 1 dose. 4 tablet Hazel Sams, PA-C     PDMP not reviewed this encounter.   Hazel Sams, PA-C 02/06/21 1015

## 2021-02-08 ENCOUNTER — Telehealth (HOSPITAL_COMMUNITY): Payer: Self-pay | Admitting: Emergency Medicine

## 2021-02-08 LAB — CERVICOVAGINAL ANCILLARY ONLY
Bacterial Vaginitis (gardnerella): NEGATIVE
Candida Glabrata: NEGATIVE
Candida Vaginitis: POSITIVE — AB
Chlamydia: NEGATIVE
Comment: NEGATIVE
Comment: NEGATIVE
Comment: NEGATIVE
Comment: NEGATIVE
Comment: NEGATIVE
Comment: NORMAL
Neisseria Gonorrhea: NEGATIVE
Trichomonas: NEGATIVE

## 2021-02-08 MED ORDER — FLUCONAZOLE 150 MG PO TABS: 150.0000 mg | ORAL_TABLET | Freq: Once | ORAL | 0 refills | Status: AC

## 2021-08-04 ENCOUNTER — Encounter: Payer: Self-pay | Admitting: Family Medicine

## 2021-08-04 ENCOUNTER — Telehealth: Payer: Self-pay | Admitting: Family Medicine

## 2021-08-04 DIAGNOSIS — R079 Chest pain, unspecified: Secondary | ICD-10-CM

## 2021-08-04 DIAGNOSIS — R1013 Epigastric pain: Secondary | ICD-10-CM

## 2021-08-04 NOTE — Progress Notes (Signed)
Ms. Misty Bailey, brockschmidt are scheduled for a virtual visit with your provider today.    Just as we do with appointments in the office, we must obtain your consent to participate.  Your consent will be active for this visit and any virtual visit you may have with one of our providers in the next 365 days.    If you have a MyChart account, I can also send a copy of this consent to you electronically.  All virtual visits are billed to your insurance company just like a traditional visit in the office.  As this is a virtual visit, video technology does not allow for your provider to perform a traditional examination.  This may limit your provider's ability to fully assess your condition.  If your provider identifies any concerns that need to be evaluated in person or the need to arrange testing such as labs, EKG, etc, we will make arrangements to do so.    Although advances in technology are sophisticated, we cannot ensure that it will always work on either your end or our end.  If the connection with a video visit is poor, we may have to switch to a telephone visit.  With either a video or telephone visit, we are not always able to ensure that we have a secure connection.   I need to obtain your verbal consent now.   Are you willing to proceed with your visit today?   Misty Bailey has provided verbal consent on 08/04/2021 for a virtual visit (video or telephone).   Misty Mayo, NP 08/04/2021  2:57 PM   Date:  08/04/2021   ID:  Clarene Critchley Bailey, DOB 26-Nov-1971, MRN [id number]  Patient Location: Home Provider Location: Home Office   Participants: Patient and Provider for Visit and Wrap up  Method of visit: Video  Location of Patient: Home Location of Provider: Home Office Consent was obtain for visit over the video. Services rendered by provider: Visit was performed via video  A video enabled telemedicine application was used and I verified that I am speaking with the correct person using two  identifiers.  PCP:  Ladell Pier, MD   Chief Complaint:  chest pain  History of Present Illness:    Misty Bailey is a 50 y.o. female with history as stated below. Presents video telehealth for an acute care visit related to newer onset chest pain. She reports that she started having this nagging/achy- (difficult to describe pain) about 3 months back. It was not as common when it first started, but now it is happening regularly every 2 days to daily.  She is so uncomfortable cant sleep well. She reports sweating at times and nausea, no vomiting. She denies radiation with the pain, but reports at times in psat pain with eating but that aspect has stopped.  Antiacids- helps with nausea at times and sour stomach feeling, but the sensation comes right back. Has tried tylenol PM for night time pain. She has had an increase in diarrhea- does still have gallbladder-no pain reported over the RUQ.She does have pain over the lower sternum and LUQ Denies racing heart rate, shortness of breath, leg swelling, or dizziness. Reports taking HTN, HLD medications as ordered.   Pain level is 8/10  She is willing to go be seen in person.  Past Medical, Surgical, Social History, Allergies, and Medications have been Reviewed.  Past Medical History:  Diagnosis Date   Anemia    Arthritis 02/2014   Rib Cage  Depression    Dysmenorrhea 05/08/2018   Essential hypertension 07/29/2019   Panic attacks    Suppurative hidradenitis     No outpatient medications have been marked as taking for the 08/04/21 encounter (Appointment) with Misty Mayo, NP.     Allergies:   Patient has no known allergies.   ROS See HPI for history of present illness.  Physical Exam Constitutional:      Appearance: Normal appearance.  HENT:     Head: Normocephalic.     Nose: Nose normal.  Eyes:     Conjunctiva/sclera: Conjunctivae normal.  Pulmonary:     Comments: No shortness of breath, no cough Abdominal:      Tenderness: There is abdominal tenderness in the epigastric area and left upper quadrant.     Comments: Had patient palpitate on video RUQ and LUQ  Epigastric tenderness and LUQ + tenderness With mild pain going up into sternum.  Musculoskeletal:        General: Normal range of motion.     Cervical back: Normal range of motion.  Skin:    Coloration: Skin is not jaundiced.  Neurological:     General: No focal deficit present.     Mental Status: She is alert. Mental status is at baseline.  Psychiatric:        Mood and Affect: Mood normal.        Thought Content: Thought content normal.             A&P  1. Epigastric pain S&S could be related to GER, Pancreatitis, Gallstones, or Cardiac In person eval recommended, pt agrees.    2. Chest pain of unknown etiology S&S could be related to GER, Pancreatits, Gallstones, or Cardiac In person eval recommended, pt agrees.    I discussed the assessment and treatment plan with the patient. The patient was provided an opportunity to ask questions and all were answered. The patient agreed with the plan and demonstrated an understanding of the instructions.   The patient was advised to call back or seek an in-person evaluation if the symptoms worsen or if the condition fails to improve as anticipated.   The above assessment and management plan was discussed with the patient. The patient verbalized understanding of and has agreed to the management plan. Patient is aware to call the clinic if symptoms persist or worsen. Patient is aware when to return to the clinic for a follow-up visit. Patient educated on when it is appropriate to go to the emergency department.   Time:   Today, I have spent 20 minutes with the patient with telehealth technology discussing the above problems, reviewing the chart, previous notes, medications and orders.   Medication Changes: No orders of the defined types were placed in this encounter.    Disposition:   Follow up tonight at [facility]  Signed, Misty Mayo, NP  08/04/2021 2:57 PM

## 2021-08-04 NOTE — Patient Instructions (Signed)
As discussed please go to your nearest Urgent Care to be seen for need of an EKG  If the pain in your chest worsens consider calling 911 as we discussed the various pain you can feel And or please chew 4 of the '81mg'$  asa prior to going to be seen.  You might need to have additional test performed based off findings.  I hope you feel better soon.  I appreciate the opportunity to provide you with care for your health and wellness.  Please continue to practice social distancing to keep you, your family, and our community safe.  If you must go out, please wear a mask and practice good handwashing.  Have a wonderful day. With Gratitude, Cherly Beach, DNP, AGNP-BC

## 2021-08-05 ENCOUNTER — Encounter (HOSPITAL_COMMUNITY): Payer: Self-pay

## 2021-08-05 ENCOUNTER — Ambulatory Visit (HOSPITAL_COMMUNITY)
Admission: RE | Admit: 2021-08-05 | Discharge: 2021-08-05 | Disposition: A | Discharge status: Home / Self Care | Payer: Self-pay | Source: Ambulatory Visit | Attending: Emergency Medicine | Admitting: Emergency Medicine

## 2021-08-05 ENCOUNTER — Other Ambulatory Visit: Payer: Self-pay

## 2021-08-05 DIAGNOSIS — Z751 Person awaiting admission to adequate facility elsewhere: Secondary | ICD-10-CM

## 2021-08-05 DIAGNOSIS — R079 Chest pain, unspecified: Secondary | ICD-10-CM

## 2021-08-05 NOTE — ED Triage Notes (Signed)
Chest pain intermittent for 3 months and has worsened over the last month.  Patient had a virtual visit yesterday and was told to come to ucc or ed.  Pain is between breasts, radiating into back.reports sob.  No diaphoresis.  Described as nagging, dull pain, not pressure.   Patient has run out of daily medicines 2 weeks ago.

## 2021-08-05 NOTE — ED Provider Notes (Signed)
Pt here for chest pain intermit for a few months. States that she had a virtual yesterday and was told if it becomes worse to go to there ER. Pt states the pain cont to get worse. EKG was normal. When I spoke to pt in triage I explained that we are glad to see her. We are not able to r/o cardiac vs epigastric. SHe has diverticulitis and wants to r/o this out.  She understands that we do not have a ct scan and that she would go to the er for further testing and r/o any cardiac concerns.  Pt will have family drive her. Stable at this time.    Marney Setting, NP 08/05/21 1127

## 2021-08-05 NOTE — ED Notes (Signed)
Patient is being discharged from the Urgent Care and sent to the Emergency Department via pov . Per Threasa Beards, NP, patient is in need of higher level of care due to cardiac complaint. Patient is aware and verbalizes understanding of plan of care.  Vitals:   08/05/21 1101  BP: (!) 144/86  Pulse: 72  Resp: 18  Temp: 99.1 F (37.3 C)  SpO2: 100%

## 2021-08-09 ENCOUNTER — Ambulatory Visit: Payer: Self-pay | Admitting: Internal Medicine

## 2021-08-17 ENCOUNTER — Ambulatory Visit: Payer: Self-pay | Attending: Internal Medicine | Admitting: Internal Medicine

## 2021-08-17 ENCOUNTER — Other Ambulatory Visit: Payer: Self-pay

## 2021-08-17 ENCOUNTER — Encounter: Payer: Self-pay | Admitting: Internal Medicine

## 2021-08-17 ENCOUNTER — Other Ambulatory Visit (HOSPITAL_COMMUNITY)
Admission: RE | Admit: 2021-08-17 | Discharge: 2021-08-17 | Disposition: A | Discharge status: Home / Self Care | Payer: Self-pay | Source: Ambulatory Visit | Attending: Internal Medicine | Admitting: Internal Medicine

## 2021-08-17 VITALS — BP 140/86 | HR 97 | Resp 16 | Wt 141.0 lb

## 2021-08-17 DIAGNOSIS — Z8249 Family history of ischemic heart disease and other diseases of the circulatory system: Secondary | ICD-10-CM | POA: Insufficient documentation

## 2021-08-17 DIAGNOSIS — Z8349 Family history of other endocrine, nutritional and metabolic diseases: Secondary | ICD-10-CM | POA: Insufficient documentation

## 2021-08-17 DIAGNOSIS — Z79899 Other long term (current) drug therapy: Secondary | ICD-10-CM | POA: Insufficient documentation

## 2021-08-17 DIAGNOSIS — E782 Mixed hyperlipidemia: Secondary | ICD-10-CM | POA: Insufficient documentation

## 2021-08-17 DIAGNOSIS — I10 Essential (primary) hypertension: Secondary | ICD-10-CM | POA: Insufficient documentation

## 2021-08-17 DIAGNOSIS — N761 Subacute and chronic vaginitis: Secondary | ICD-10-CM | POA: Insufficient documentation

## 2021-08-17 DIAGNOSIS — Z885 Allergy status to narcotic agent status: Secondary | ICD-10-CM | POA: Insufficient documentation

## 2021-08-17 DIAGNOSIS — R079 Chest pain, unspecified: Secondary | ICD-10-CM | POA: Insufficient documentation

## 2021-08-17 DIAGNOSIS — F17290 Nicotine dependence, other tobacco product, uncomplicated: Secondary | ICD-10-CM | POA: Insufficient documentation

## 2021-08-17 MED ORDER — OMEPRAZOLE 20 MG PO CPDR
20.0000 mg | DELAYED_RELEASE_CAPSULE | Freq: Every day | ORAL | 3 refills | Status: DC
Start: 2021-08-17 — End: 2021-09-17
  Filled 2021-08-17: qty 30, 30d supply, fill #0

## 2021-08-17 MED ORDER — ATORVASTATIN CALCIUM 10 MG PO TABS
10.0000 mg | ORAL_TABLET | Freq: Every day | ORAL | 4 refills | Status: DC
  Filled 2021-08-17: qty 30, 30d supply, fill #0
  Filled 2022-08-17 (×2): qty 90, 90d supply, fill #0

## 2021-08-17 MED ORDER — AMLODIPINE BESYLATE 5 MG PO TABS
5.0000 mg | ORAL_TABLET | Freq: Every day | ORAL | 4 refills | Status: DC
  Filled 2021-08-17 – 2022-06-03 (×2): qty 30, 30d supply, fill #0
  Filled 2022-08-17 (×2): qty 90, 90d supply, fill #1

## 2021-08-17 NOTE — Progress Notes (Signed)
Patient ID: Misty Bailey, female    DOB: 1971-10-25  MRN: [id number]  CC: Chest Pain   Subjective: Misty Bailey is a 50 y.o. female who presents for eval chest pain Her concerns today include:  Dysmenorrhea, diverticulosis, HL, HTN, hidradenitis, bronchospasm.    Pt c/o intermittent CP x 3 mths. Pain is located in the middle of the chest over the lower sternum.  The pain does not radiate down the arm or up the neck.  About 50% of the times it is associated with nausea and "a sour stomach."  She describes the feeling of the sour stomach as having to burp and burp has a foul odor.  Sometimes she gets diarrhea the following day. No wgh loss. -Pain can occur at rest or with ambulation. -She does not related to eating or any particular types of foods. -Pain episodes can last 24 to 72 hours. -She takes an over-the-counter antacid that has 325 mg of aspirin in it.  Sometimes she takes Tylenol 500 mg.  She states that these help the pain but does not take it away completely when she has the episodes.  She had a virtual visit with the nurse practitioner on the 10th of this month for the symptoms.  In person evaluation was recommended.  Seen in urgent care the following day.  Reports having had an EKG and was told that it was normal.  States she was told that she would need a CAT scan and stress test.  2.  Thinks she has vaginal bacterial infection White dischg intermittently for 1 mth She has been doing self treatment with Boric Acid suppository - uses it for 1-3 days at a time.  Same partner for yrws.   3.  Htn:  Out of med, Norvasc, x 1 mth due to limited finances.  She is currently unemployed.  She has been searching out pharmacy online that would be cheaper for her.  She has been out of her cholesterol-lowering agent atorvastatin for the same reason. Current Outpatient Medications on File Prior to Visit  Medication Sig Dispense Refill   albuterol (VENTOLIN HFA) 108 (90 Base) MCG/ACT  inhaler Inhale 2 puffs into the lungs every 6 (six) hours as needed for wheezing or shortness of breath. 8 g 4   fluticasone (FLONASE) 50 MCG/ACT nasal spray Place 1 spray into both nostrils daily. 9.9 mL 1   No current facility-administered medications on file prior to visit.    Allergies  Allergen Reactions   Codeine Shortness Of Breath    Social History   Socioeconomic History   Marital status: Single    Spouse name: Not on file   Number of children: 0   Years of education: Associates   Highest education level: Not on file  Occupational History   Occupation: Animal nutritionist: [employer]  Tobacco Use   Smoking status: Former   Smokeless tobacco: Current   Tobacco comments:    Optometrist Use: Never used  Substance and Sexual Activity   Alcohol use: Not Currently    Comment: Occassionally    Drug use: Yes    Types: Marijuana   Sexual activity: Not Currently    Partners: Male    Birth control/protection: None  Other Topics Concern   Not on file  Social History Narrative   Boyfriend incarcerated 2014-2018   Social Determinants of Health   Financial Resource Strain: Not on file  Food Insecurity: Not on  file  Transportation Needs: Not on file  Physical Activity: Not on file  Stress: Not on file  Social Connections: Not on file  Intimate Partner Violence: Not on file    Family History  Problem Relation Age of Onset   Diabetes Mother    Hypertension Mother    Hyperlipidemia Mother    Heart murmur Mother    Hypertension Father    Stroke Father    Hyperlipidemia Father    Hypertension Maternal Grandmother    Diabetes Maternal Grandmother    Osteoporosis Maternal Grandmother    Thyroid disease Paternal Aunt    Breast cancer Neg Hx     Past Surgical History:  Procedure Laterality Date   FOOT SURGERY     PELVIC LAPAROSCOPY  1998/1999 `   pelvic adhesions and pain    ROS: Review of Systems Negative except as stated  above  PHYSICAL EXAM: BP 140/86   Pulse 97   Resp 16   Wt 141 lb (64 kg)   SpO2 98%   BMI 26.64 kg/m   Wt Readings from Last 3 Encounters:  08/17/21 141 lb (64 kg)  01/04/21 136 lb 9.6 oz (62 kg)  04/30/20 134 lb 12.8 oz (61.1 kg)    Physical Exam  General appearance - alert, well appearing, middle-aged African-American female and in no distress Mental status - normal mood, behavior, speech, dress, motor activity, and thought processes Chest - clear to auscultation, no wheezes, rales or rhonchi, symmetric air entry Heart -patient has moderate tenderness on palpation over the lower sternum.  Regular rate rhythm, no gallops or murmurs heard. Abdomen -normal bowel sounds, nondistended, soft.  Mild epigastric tenderness without guarding or rebound.  No organomegaly.  Murphy sign negative. Extremities - peripheral pulses normal, no pedal edema, no clubbing or cyanosis   EKG: Sinus bradycardia with rate of 57.  Good R wave progression.  Some T inversions in the inferior leads III and aVF which seems new compared to previous EKG done in 2020.  I was unable to pull up the images of the recent ECG that she had done in urgent care earlier this month. CMP Latest Ref Rng & Units 02/03/2021 07/29/2019 07/30/2017  Glucose 65 - 99 mg/dL 98 82 114(H)  BUN 6 - 24 mg/dL '13 17 6  '$ Creatinine 0.57 - 1.00 mg/dL 0.97 1.02(H) 1.07(H)  Sodium 134 - 144 mmol/L 139 145(H) 139  Potassium 3.5 - 5.2 mmol/L 4.6 4.7 4.2  Chloride 96 - 106 mmol/L 107(H) 107(H) 108  CO2 20 - 29 mmol/L '21 26 24  '$ Calcium 8.7 - 10.2 mg/dL 9.3 9.5 9.1  Total Protein 6.0 - 8.5 g/dL 6.5 6.2 7.3  Total Bilirubin 0.0 - 1.2 mg/dL 0.4 <0.2 0.5  Alkaline Phos 44 - 121 IU/L 81 66 84  AST 0 - 40 IU/L 13 13 14(L)  ALT 0 - 32 IU/L 11 8 10(L)   Lipid Panel     Component Value Date/Time   CHOL 213 (H) 02/03/2021 1005   TRIG 78 02/03/2021 1005   HDL 56 02/03/2021 1005   CHOLHDL 3.8 02/03/2021 1005   LDLCALC 143 (H) 02/03/2021 1005    CBC     Component Value Date/Time   WBC 5.3 02/03/2021 1005   WBC 6.3 07/30/2017 1308   RBC 4.65 02/03/2021 1005   RBC 4.41 07/30/2017 1308   HGB 13.6 02/03/2021 1005   HCT 40.9 02/03/2021 1005   PLT 296 02/03/2021 1005   MCV 88 02/03/2021 1005  MCH 29.2 02/03/2021 1005   MCH 28.6 07/30/2017 1308   MCHC 33.3 02/03/2021 1005   MCHC 32.3 07/30/2017 1308   RDW 12.2 02/03/2021 1005   LYMPHSABS 2.7 01/25/2018 1106   MONOABS 0.5 08/09/2013 1045   EOSABS 0.2 01/25/2018 1106   BASOSABS 0.0 01/25/2018 1106    ASSESSMENT AND PLAN: 1. Chest pain in adult Differential diagnoses include GERD, pancreatitis, CAD. She definitely has a GI component to her symptoms that may be acid reflux.  I recommend stopping the antacid that contains aspirin and any NSAID that she may be taking.  Start omeprazole twice a day.  GERD precautions given.  I will have her follow-up with me in 1 month to see if symptoms have improved with the omeprazole. -She also has reproducible tenderness over the lower sternum which may suggest a component of costochondritis.  I recommend Tylenol as needed. -She does have risk factors for coronary artery disease.  Women sometimes can present with atypical symptoms compared to men.  Therefore we will refer her to cardiology - omeprazole (PRILOSEC) 20 MG capsule; Take 1 capsule (20 mg total) by mouth daily.  Dispense: 30 capsule; Refill: 3 - Ambulatory referral to Cardiology - EKG 12-Lead - CBC - Comprehensive metabolic panel - Lipase  2. Essential hypertension Not at goal.  She will try getting her prescriptions through our pharmacy which may be cheaper for her. - amLODipine (NORVASC) 5 MG tablet; Take 1 tablet (5 mg total) by mouth daily.  Dispense: 30 tablet; Refill: 4  3. Mixed hyperlipidemia - atorvastatin (LIPITOR) 10 MG tablet; Take 1 tablet (10 mg total) by mouth daily.  Dispense: 30 tablet; Refill: 4  4. Chronic vaginitis - Cervicovaginal ancillary only    Patient was  given the opportunity to ask questions.  Patient verbalized understanding of the plan and was able to repeat key elements of the plan.   Orders Placed This Encounter  Procedures   CBC   Comprehensive metabolic panel   Lipase   Ambulatory referral to Cardiology   EKG 12-Lead     Requested Prescriptions   Signed Prescriptions Disp Refills   amLODipine (NORVASC) 5 MG tablet 30 tablet 4    Sig: Take 1 tablet (5 mg total) by mouth daily.   atorvastatin (LIPITOR) 10 MG tablet 30 tablet 4    Sig: Take 1 tablet (10 mg total) by mouth daily.   omeprazole (PRILOSEC) 20 MG capsule 30 capsule 3    Sig: Take 1 capsule (20 mg total) by mouth daily.    Return in about 1 month (around 09/17/2021).  Karle Plumber, MD, FACP

## 2021-08-18 ENCOUNTER — Other Ambulatory Visit: Payer: Self-pay | Admitting: Internal Medicine

## 2021-08-18 ENCOUNTER — Other Ambulatory Visit: Payer: Self-pay

## 2021-08-18 LAB — CBC
Hematocrit: 42.3 % (ref 34.0–46.6)
Hemoglobin: 13.6 g/dL (ref 11.1–15.9)
MCH: 28.5 pg (ref 26.6–33.0)
MCHC: 32.2 g/dL (ref 31.5–35.7)
MCV: 89 fL (ref 79–97)
Platelets: 312 10*3/uL (ref 150–450)
RBC: 4.77 x10E6/uL (ref 3.77–5.28)
RDW: 12.6 % (ref 11.7–15.4)
WBC: 5.7 10*3/uL (ref 3.4–10.8)

## 2021-08-18 LAB — COMPREHENSIVE METABOLIC PANEL
ALT: 10 IU/L (ref 0–32)
AST: 14 IU/L (ref 0–40)
Albumin/Globulin Ratio: 1.7 (ref 1.2–2.2)
Albumin: 4.2 g/dL (ref 3.8–4.9)
Alkaline Phosphatase: 87 IU/L (ref 44–121)
BUN/Creatinine Ratio: 10 (ref 9–23)
BUN: 11 mg/dL (ref 6–24)
Bilirubin Total: 0.4 mg/dL (ref 0.0–1.2)
CO2: 20 mmol/L (ref 20–29)
Calcium: 9.5 mg/dL (ref 8.7–10.2)
Chloride: 105 mmol/L (ref 96–106)
Creatinine, Ser: 1.08 mg/dL — ABNORMAL HIGH (ref 0.57–1.00)
Globulin, Total: 2.5 g/dL (ref 1.5–4.5)
Glucose: 87 mg/dL (ref 65–99)
Potassium: 4 mmol/L (ref 3.5–5.2)
Sodium: 142 mmol/L (ref 134–144)
Total Protein: 6.7 g/dL (ref 6.0–8.5)
eGFR: 62 mL/min/{1.73_m2} (ref >59)

## 2021-08-18 LAB — CERVICOVAGINAL ANCILLARY ONLY
Bacterial Vaginitis (gardnerella): POSITIVE — AB
Candida Glabrata: NEGATIVE
Candida Vaginitis: NEGATIVE
Chlamydia: NEGATIVE
Comment: NEGATIVE
Comment: NEGATIVE
Comment: NEGATIVE
Comment: NEGATIVE
Comment: NEGATIVE
Comment: NORMAL
Neisseria Gonorrhea: NEGATIVE
Trichomonas: NEGATIVE

## 2021-08-18 LAB — LIPASE: Lipase: 17 U/L (ref 14–72)

## 2021-08-18 MED ORDER — METRONIDAZOLE 500 MG PO TABS
500.0000 mg | ORAL_TABLET | Freq: Two times a day (BID) | ORAL | 0 refills | Status: DC
  Filled 2021-08-18: qty 14, 7d supply, fill #0

## 2021-08-19 ENCOUNTER — Other Ambulatory Visit: Payer: Self-pay

## 2021-08-19 ENCOUNTER — Encounter: Payer: Self-pay | Admitting: Internal Medicine

## 2021-08-19 ENCOUNTER — Other Ambulatory Visit: Payer: Self-pay | Admitting: Internal Medicine

## 2021-08-19 MED ORDER — METRONIDAZOLE 0.75 % VA GEL
1.0000 | Freq: Every day | VAGINAL | 0 refills | Status: DC
  Filled 2021-08-19: qty 70, 5d supply, fill #0

## 2021-08-20 ENCOUNTER — Ambulatory Visit: Payer: Self-pay | Attending: Internal Medicine

## 2021-08-20 ENCOUNTER — Other Ambulatory Visit: Payer: Self-pay

## 2021-09-17 ENCOUNTER — Encounter: Payer: Self-pay | Admitting: Internal Medicine

## 2021-09-17 ENCOUNTER — Other Ambulatory Visit: Payer: Self-pay

## 2021-09-17 ENCOUNTER — Ambulatory Visit: Payer: Self-pay | Attending: Internal Medicine | Admitting: Internal Medicine

## 2021-09-17 VITALS — BP 135/88 | HR 63 | Resp 16 | Wt 142.2 lb

## 2021-09-17 DIAGNOSIS — R079 Chest pain, unspecified: Secondary | ICD-10-CM

## 2021-09-17 MED ORDER — OMEPRAZOLE 20 MG PO CPDR
20.0000 mg | DELAYED_RELEASE_CAPSULE | Freq: Two times a day (BID) | ORAL | 3 refills | Status: DC
  Filled 2021-09-17 – 2021-10-08 (×3): qty 60, 30d supply, fill #0

## 2021-09-17 NOTE — Progress Notes (Signed)
Patient ID: Misty Bailey, female    DOB: June 07, 1971  MRN: [id number]  CC: Follow-up and Chest Pain   Subjective: Misty Bailey is a 50 y.o. female who presents for 1 mth f/u CP Her concerns today include:  Dysmenorrhea, diverticulosis, HL, HTN, hidradenitis, bronchospasm.   Patient seen 1 month ago with complaints of chest pain episodes that seemed to have GI component.  EKG was abnormal.  She was started on omeprazole and referred to cardiology.  The cardiologist did try to reach her to schedule an appointment unsuccessfully. Pt reports the Omeprazole helped a little but she still has the pain in the center lower chest.  Goes and comes and can last for 2-3 days.  Flare last night and have not eaten anything for the day because of it.  No vomiting with it but some nausea at times.   Has applied for [facility] and awaiting decision.  Turned in form 09/04/2021   HTN:  was able to get Norvasc from our pharmacy at a reduce cost.  Has not taken med as yet for today  Patient Active Problem List   Diagnosis Date Noted   Influenza vaccine refused 01/04/2021   Inguinal hernia of left side without obstruction or gangrene 04/30/2020   Hyperlipidemia 07/30/2019   Syncope 07/29/2019   Essential hypertension 07/29/2019   Dysmenorrhea 05/08/2018   Herpes 03/21/2014   Suppurative hidradenitis    Depression      Current Outpatient Medications on File Prior to Visit  Medication Sig Dispense Refill   albuterol (VENTOLIN HFA) 108 (90 Base) MCG/ACT inhaler Inhale 2 puffs into the lungs every 6 (six) hours as needed for wheezing or shortness of breath. 8 g 4   amLODipine (NORVASC) 5 MG tablet Take 1 tablet (5 mg total) by mouth daily. 30 tablet 4   atorvastatin (LIPITOR) 10 MG tablet Take 1 tablet (10 mg total) by mouth daily. 30 tablet 4   fluticasone (FLONASE) 50 MCG/ACT nasal spray Place 1 spray into both nostrils daily. 9.9 mL 1   metroNIDAZOLE (METROGEL VAGINAL) 0.75 % vaginal gel Place 1  Applicatorful vaginally at bedtime. Use nightly at bedtime for 5 days. 70 g 0   omeprazole (PRILOSEC) 20 MG capsule Take 1 capsule (20 mg total) by mouth daily. 30 capsule 3   No current facility-administered medications on file prior to visit.    Allergies  Allergen Reactions   Codeine Shortness Of Breath    Social History   Socioeconomic History   Marital status: Single    Spouse name: Not on file   Number of children: 0   Years of education: Associates   Highest education level: Not on file  Occupational History   Occupation: Animal nutritionist: [employer]  Tobacco Use   Smoking status: Former   Smokeless tobacco: Current   Tobacco comments:    Optometrist Use: Never used  Substance and Sexual Activity   Alcohol use: Not Currently    Comment: Occassionally    Drug use: Yes    Types: Marijuana   Sexual activity: Not Currently    Partners: Male    Birth control/protection: None  Other Topics Concern   Not on file  Social History Narrative   Boyfriend incarcerated 2014-2018   Social Determinants of Health   Financial Resource Strain: Not on file  Food Insecurity: Not on file  Transportation Needs: Not on file  Physical Activity: Not on file  Stress: Not on file  Social Connections: Not on file  Intimate Partner Violence: Not on file    Family History  Problem Relation Age of Onset   Diabetes Mother    Hypertension Mother    Hyperlipidemia Mother    Heart murmur Mother    Hypertension Father    Stroke Father    Hyperlipidemia Father    Hypertension Maternal Grandmother    Diabetes Maternal Grandmother    Osteoporosis Maternal Grandmother    Thyroid disease Paternal Aunt    Breast cancer Neg Hx     Past Surgical History:  Procedure Laterality Date   FOOT SURGERY     PELVIC LAPAROSCOPY  1998/1999 `   pelvic adhesions and pain    ROS: Review of Systems Negative except as stated above  PHYSICAL EXAM: BP 135/88   Pulse  63   Resp 16   Wt 142 lb 3.2 oz (64.5 kg)   SpO2 96%   BMI 26.87 kg/m   Wt Readings from Last 3 Encounters:  09/17/21 142 lb 3.2 oz (64.5 kg)  08/17/21 141 lb (64 kg)  01/04/21 136 lb 9.6 oz (62 kg)    Physical Exam  General appearance - alert, well appearing, and in no distress Mental status - normal mood, behavior, speech, dress, motor activity, and thought processes Chest - clear to auscultation, no wheezes, rales or rhonchi, symmetric air entry Heart -she has reproducible tenderness over the lower sternum.  Normal rate, regular rhythm, normal S1, S2, no murmurs, rubs, clicks or gallops Abdomen - soft, nontender, nondistended, no masses or organomegaly  Depression screen [facility] 2/9 09/17/2021 08/17/2021 01/04/2021  Decreased Interest - 2 -  Down, Depressed, Hopeless 2 2 2   PHQ - 2 Score 2 4 2   Altered sleeping 3 3 2   Tired, decreased energy 2 2 -  Change in appetite 1 1 -  Feeling bad or failure about yourself  2 2 2   Trouble concentrating 1 1 -  Moving slowly or fidgety/restless 1 2 -  Suicidal thoughts 0 1 -  PHQ-9 Score 12 16 6     CMP Latest Ref Rng & Units 08/17/2021 02/03/2021 07/29/2019  Glucose 65 - 99 mg/dL 87 98 82  BUN 6 - 24 mg/dL 11 13 17   Creatinine 0.57 - 1.00 mg/dL 1.08(H) 0.97 1.02(H)  Sodium 134 - 144 mmol/L 142 139 145(H)  Potassium 3.5 - 5.2 mmol/L 4.0 4.6 4.7  Chloride 96 - 106 mmol/L 105 107(H) 107(H)  CO2 20 - 29 mmol/L 20 21 26   Calcium 8.7 - 10.2 mg/dL 9.5 9.3 9.5  Total Protein 6.0 - 8.5 g/dL 6.7 6.5 6.2  Total Bilirubin 0.0 - 1.2 mg/dL 0.4 0.4 <0.2  Alkaline Phos 44 - 121 IU/L 87 81 66  AST 0 - 40 IU/L 14 13 13   ALT 0 - 32 IU/L 10 11 8    Lipid Panel     Component Value Date/Time   CHOL 213 (H) 02/03/2021 1005   TRIG 78 02/03/2021 1005   HDL 56 02/03/2021 1005   CHOLHDL 3.8 02/03/2021 1005   LDLCALC 143 (H) 02/03/2021 1005    CBC    Component Value Date/Time   WBC 5.7 08/17/2021 1037   WBC 6.3 07/30/2017 1308   RBC 4.77 08/17/2021 1037    RBC 4.41 07/30/2017 1308   HGB 13.6 08/17/2021 1037   HCT 42.3 08/17/2021 1037   PLT 312 08/17/2021 1037   MCV 89 08/17/2021 1037   MCH 28.5 08/17/2021 1037  MCH 28.6 07/30/2017 1308   MCHC 32.2 08/17/2021 1037   MCHC 32.3 07/30/2017 1308   RDW 12.6 08/17/2021 1037   LYMPHSABS 2.7 01/25/2018 1106   MONOABS 0.5 08/09/2013 1045   EOSABS 0.2 01/25/2018 1106   BASOSABS 0.0 01/25/2018 1106    ASSESSMENT AND PLAN:  1. Chest pain in adult Increase omeprazole to 20 mg twice a day. I will try to get her in with the gastroenterologist as well as the cardiologist. No change made in the dose of amlodipine as she has not taken medication as yet for today. - omeprazole (PRILOSEC) 20 MG capsule; Take 1 capsule (20 mg total) by mouth 2 (two) times daily before a meal.  Dispense: 60 capsule; Refill: 3 - Ambulatory referral to Gastroenterology - Ambulatory referral to Cardiology   Patient was given the opportunity to ask questions.  Patient verbalized understanding of the plan and was able to repeat key elements of the plan.   No orders of the defined types were placed in this encounter.    Requested Prescriptions    No prescriptions requested or ordered in this encounter    No follow-ups on file.  Karle Plumber, MD, FACP

## 2021-09-17 NOTE — Patient Instructions (Addendum)
Increase Omeprazole to 20 mg twice a day. I have referred you to the gastroenterologist and the cardiologist.

## 2021-09-24 ENCOUNTER — Other Ambulatory Visit: Payer: Self-pay

## 2021-10-01 ENCOUNTER — Other Ambulatory Visit: Payer: Self-pay

## 2021-10-01 ENCOUNTER — Ambulatory Visit (INDEPENDENT_AMBULATORY_CARE_PROVIDER_SITE_OTHER): Payer: Self-pay | Admitting: Internal Medicine

## 2021-10-01 ENCOUNTER — Encounter: Payer: Self-pay | Admitting: Internal Medicine

## 2021-10-01 VITALS — BP 138/82 | HR 79 | Ht 61.0 in | Wt 136.0 lb

## 2021-10-01 DIAGNOSIS — R0789 Other chest pain: Secondary | ICD-10-CM

## 2021-10-01 DIAGNOSIS — I10 Essential (primary) hypertension: Secondary | ICD-10-CM

## 2021-10-01 DIAGNOSIS — E785 Hyperlipidemia, unspecified: Secondary | ICD-10-CM

## 2021-10-01 DIAGNOSIS — R079 Chest pain, unspecified: Secondary | ICD-10-CM

## 2021-10-01 LAB — BASIC METABOLIC PANEL
BUN/Creatinine Ratio: 16 (ref 9–23)
BUN: 18 mg/dL (ref 6–24)
CO2: 21 mmol/L (ref 20–29)
Calcium: 9.7 mg/dL (ref 8.7–10.2)
Chloride: 107 mmol/L — ABNORMAL HIGH (ref 96–106)
Creatinine, Ser: 1.16 mg/dL — ABNORMAL HIGH (ref 0.57–1.00)
Glucose: 98 mg/dL (ref 70–99)
Potassium: 4.2 mmol/L (ref 3.5–5.2)
Sodium: 142 mmol/L (ref 134–144)
eGFR: 57 mL/min/{1.73_m2} — ABNORMAL LOW (ref >59)

## 2021-10-01 MED ORDER — NITROGLYCERIN 0.4 MG SL SUBL: 0.4000 mg | SUBLINGUAL_TABLET | SUBLINGUAL | 0 refills | Status: DC | PRN

## 2021-10-01 MED ORDER — METOPROLOL TARTRATE 50 MG PO TABS: 50.0000 mg | ORAL_TABLET | ORAL | 0 refills | Status: DC

## 2021-10-01 NOTE — Patient Instructions (Addendum)
Medication Instructions:  Start Nitroglycerin 0.4 mg every 5 minutes as needed for chest pain   *If you need a refill on your cardiac medications before your next appointment, please call your pharmacy*   Lab Work: Bmp - Today   If you have labs (blood work) drawn today and your tests are completely normal, you will receive your results only by: [place] (if you have MyChart) OR A paper copy in the mail If you have any lab test that is abnormal or we need to change your treatment, we will call you to review the results.   Testing/Procedures: Your physician has requested that you have an echocardiogram. Echocardiography is a painless test that uses sound waves to create images of your heart. It provides your doctor with information about the size and shape of your heart and how well your heart's chambers and valves are working. This procedure takes approximately one hour. There are no restrictions for this procedure.  Your physician has ordered for you to have a cardiac CTA *Instructions below*   Follow-Up: Follow up as needed    Other Instructions   Your cardiac CT will be scheduled at one of the below locations:   [facility] [address] [place], [place] [address] [phone]   If scheduled at [facility], please arrive at the [facility] main entrance (entrance A) of [facility] 30 minutes prior to test start time. Proceed to the [facility] Radiology Department (first floor) to check-in and test prep.  Please follow these instructions carefully (unless otherwise directed):  On the Night Before the Test: Be sure to Drink plenty of water. Do not consume any caffeinated/decaffeinated beverages or chocolate 12 hours prior to your test. Do not take any antihistamines 12 hours prior to your test.   On the Day of the Test: Drink plenty of water until 1 hour prior to the test. Do not eat any food 4 hours prior to the test. You may  take your regular medications prior to the test.  Take metoprolol (Lopressor) two hours prior to test. FEMALES- please wear underwire-free bra if available, avoid dresses & tight clothing       After the Test: Drink plenty of water. After receiving IV contrast, you may experience a mild flushed feeling. This is normal. On occasion, you may experience a mild rash up to 24 hours after the test. This is not dangerous. If this occurs, you can take Benadryl 25 mg and increase your fluid intake. If you experience trouble breathing, this can be serious. If it is severe call 911 IMMEDIATELY. If it is mild, please call our office.  Please allow 2-4 weeks for scheduling of routine cardiac CTs. Some insurance companies require a pre-authorization which may delay scheduling of this test.   For non-scheduling related questions, please contact the cardiac imaging nurse navigator should you have any questions/concerns: Marchia Bond, Cardiac Imaging Nurse Navigator Gordy Clement, Cardiac Imaging Nurse Navigator [facility] Direct Office Dial: [phone]   For scheduling needs, including cancellations and rescheduling, please call [place], [phone].

## 2021-10-01 NOTE — Progress Notes (Signed)
Cardiology Office Note:    Date:  10/01/2021   ID:  Misty Bailey, DOB 1971-01-04, MRN [id number]  PCP:  Ladell Pier, MD   [facility] Providers Cardiologist:  None     Referring MD: Ladell Pier, MD   CC:  Chest pain admit 6  History of Present Illness:    PROBLEM LIST: 1.  Hyperlipidemia 2.  Hypertension 3.  Diverticulosis  Misty Bailey is a 50 y.o. female with a hx of with the above listed medical history who was referred by Dr. Wynetta Emery for recommendations regarding the patient's chest pain.  The patient had presented to urgent care with complaints of chest pain over the last several months.  The EKG demonstrated no acute findings.  They could not conduct a thorough cardiac or GI work-up at that point in time and since she was doing well she was discharged home.  She saw her PCP recently believe this was likely due to a GI issue given her other associate symptoms.  She was started on omeprazole.  However given her cardiac risk factors she was referred for a cardiology consultation.  The patient tells me that she has developed central dull chest discomfort.  Will occur suddenly usually when she is resting.  It will affect her for 3 or 4 straight days and she is relatively incapacitated.  Then it would go away.  Does not seem to occur with exertion.  It is occasionally associated with shortness of breath, nausea, and burping.  She has required no hospitalizations or emergency room visits.    Past Surgical History:  Procedure Laterality Date   FOOT SURGERY     PELVIC LAPAROSCOPY  1998/1999 `   pelvic adhesions and pain    Current Medications: No outpatient medications have been marked as taking for the 10/01/21 encounter (Appointment) with Early Osmond, MD.     Allergies:   Codeine   Social History   Socioeconomic History   Marital status: Single    Spouse name: Not on file   Number of children: 0   Years of education: Associates   Highest  education level: Not on file  Occupational History   Occupation: Animal nutritionist: [employer]  Tobacco Use   Smoking status: Former   Smokeless tobacco: Current   Tobacco comments:    Optometrist Use: Never used  Substance and Sexual Activity   Alcohol use: Not Currently    Comment: Occassionally    Drug use: Yes    Types: Marijuana   Sexual activity: Not Currently    Partners: Male    Birth control/protection: None  Other Topics Concern   Not on file  Social History Narrative   Boyfriend incarcerated 2014-2018   Social Determinants of Health   Financial Resource Strain: Not on file  Food Insecurity: Not on file  Transportation Needs: Not on file  Physical Activity: Not on file  Stress: Not on file  Social Connections: Not on file     Family History: The patient's family history includes Diabetes in her maternal grandmother and mother; Heart murmur in her mother; Hyperlipidemia in her father and mother; Hypertension in her father, maternal grandmother, and mother; Osteoporosis in her maternal grandmother; Stroke in her father; Thyroid disease in her paternal aunt. There is no history of Breast cancer.  ROS:   Please see the history of present illness.    All other systems reviewed and are negative.  EKGs/Labs/Other Studies Reviewed:    The following studies were reviewed today:  EKG: EKG was done last month which demonstrates sinus rhythm bradycardia with nonspecific T wave inversions in the inferior leads.  EKG today demonstrates normal sinus rhythm with nonspecific inferior ST changes and occasional PVCs.  Recent Labs: 08/17/2021: ALT 10; BUN 11; Creatinine, Ser 1.08; Hemoglobin 13.6; Platelets 312; Potassium 4.0; Sodium 142  Recent Lipid Panel    Component Value Date/Time   CHOL 213 (H) 02/03/2021 1005   TRIG 78 02/03/2021 1005   HDL 56 02/03/2021 1005   CHOLHDL 3.8 02/03/2021 1005   LDLCALC 143 (H) 02/03/2021 1005     Risk  Assessment/Calculations:           Physical Exam:    VS:  There were no vitals taken for this visit.    Wt Readings from Last 3 Encounters:  09/17/21 142 lb 3.2 oz (64.5 kg)  08/17/21 141 lb (64 kg)  01/04/21 136 lb 9.6 oz (62 kg)     GEN: Well nourished, well developed in no acute distress HEENT: Normal NECK: No JVD; No carotid bruits LYMPHATICS: No lymphadenopathy CARDIAC: RRR, no murmurs, rubs, gallops RESPIRATORY:  Clear to auscultation without rales, wheezing or rhonchi  ABDOMEN: Soft, non-tender, non-distended MUSCULOSKELETAL:  No edema; No deformity  SKIN: Warm and dry NEUROLOGIC:  Alert and oriented x 3 PSYCHIATRIC:  Normal affect   ASSESSMENT:    1. Hyperlipidemia, unspecified hyperlipidemia type   2. Chest pain of uncertain etiology   3. Essential hypertension    PLAN:    1. Hyperlipidemia, unspecified hyperlipidemia type   Patient's repeat recently with panel demonstrated a total cholesterol over 200.  She is on a statin and this is being followed by her primary care provider.   2. Chest pain of uncertain etiology   The patient has developed chest discomfort which has some atypical features.  However she has some cardiovascular risk factors including hypertension hyperlipidemia.  For this reason we will conduct an evaluation.  I will obtain an echocardiogram and a cardiac CTA with FFR assessment.  We will give her as needed nitroglycerin without refills.  My sense is that this likely is due to either an esophageal issue such as esophageal spasm or due to stress as the patient is under a high amount of this given her social situation.  Certainly if her cardiac evaluation is negative and the nitroglycerin provides relief this may suggest esophageal spasm as the etiology.  I will keep follow-up with her open ended for now but will adjust this based on the results of cardiac testing.   3. Essential hypertension   She stopped taking her amlodipine over the last  couple days and of asked her to restart this and her atorvastatin as well.          Medication Adjustments/Labs and Tests Ordered: Current medicines are reviewed at length with the patient today.  Concerns regarding medicines are outlined above.  No orders of the defined types were placed in this encounter.  No orders of the defined types were placed in this encounter.   There are no Patient Instructions on file for this visit.   Signed, Early Osmond, MD  10/01/2021 7:38 AM    [place]

## 2021-10-08 ENCOUNTER — Other Ambulatory Visit: Payer: Self-pay

## 2021-10-11 ENCOUNTER — Encounter (HOSPITAL_COMMUNITY): Payer: Self-pay

## 2021-10-15 ENCOUNTER — Ambulatory Visit (HOSPITAL_COMMUNITY): Payer: Self-pay | Attending: Cardiology

## 2021-10-15 ENCOUNTER — Other Ambulatory Visit: Payer: Self-pay

## 2021-10-15 ENCOUNTER — Telehealth: Payer: Self-pay | Admitting: Internal Medicine

## 2021-10-15 DIAGNOSIS — R079 Chest pain, unspecified: Secondary | ICD-10-CM | POA: Insufficient documentation

## 2021-10-15 LAB — ECHOCARDIOGRAM COMPLETE
Area-P 1/2: 4.41 cm2
S' Lateral: 2.7 cm

## 2021-10-15 NOTE — Telephone Encounter (Signed)
I called the Pt since her [employer] and [employer] card came back from mail, LVM to come to the office to pick her cards

## 2021-10-20 ENCOUNTER — Other Ambulatory Visit: Payer: Self-pay

## 2021-10-20 ENCOUNTER — Encounter: Payer: Self-pay | Admitting: Internal Medicine

## 2021-10-21 MED ORDER — METOPROLOL TARTRATE 50 MG PO TABS: 50.0000 mg | ORAL_TABLET | ORAL | 0 refills | Status: DC

## 2021-10-22 ENCOUNTER — Telehealth (HOSPITAL_COMMUNITY): Payer: Self-pay | Admitting: *Deleted

## 2021-10-22 ENCOUNTER — Telehealth: Payer: Self-pay | Admitting: Internal Medicine

## 2021-10-22 ENCOUNTER — Encounter (HOSPITAL_COMMUNITY): Payer: Self-pay

## 2021-10-22 NOTE — Telephone Encounter (Signed)
Attempted to call patient regarding upcoming cardiac CT appointment. Left message on voicemail with name and callback number. MyChart message also sent with instructions.  Gordy Clement RN Navigator Cardiac Imaging [facility] [phone] Office [phone] Cell

## 2021-10-22 NOTE — Telephone Encounter (Signed)
-----   Message from Ena Dawley sent at 10/21/2021  9:10 AM EDT ----- Regarding: RE: GI referral Good Morning  Referral still pending by  Rudolpho Sevin   They are behind in referrals   ----- Message ----- From: Ladell Pier, MD Sent: 10/20/2021   7:33 PM EDT To: Ena Dawley Subject: GI referral                                    Could you please check on the GI referral.  Pt now has the discount card.

## 2021-10-25 ENCOUNTER — Ambulatory Visit (HOSPITAL_COMMUNITY)
Admission: RE | Admit: 2021-10-25 | Discharge: 2021-10-25 | Disposition: A | Discharge status: Home / Self Care | Payer: Self-pay | Source: Ambulatory Visit | Attending: Internal Medicine | Admitting: Internal Medicine

## 2021-10-25 ENCOUNTER — Other Ambulatory Visit: Payer: Self-pay

## 2021-10-25 DIAGNOSIS — R0789 Other chest pain: Secondary | ICD-10-CM

## 2021-10-25 MED ORDER — NITROGLYCERIN 0.4 MG SL SUBL
SUBLINGUAL_TABLET | SUBLINGUAL | Status: AC
  Filled 2021-10-25: qty 2

## 2021-10-25 MED ORDER — IOHEXOL 350 MG/ML SOLN
95.0000 mL | Freq: Once | INTRAVENOUS | Status: AC | PRN
  Administered 2021-10-25: 95 mL via INTRAVENOUS

## 2021-10-25 MED ORDER — NITROGLYCERIN 0.4 MG SL SUBL
0.8000 mg | SUBLINGUAL_TABLET | Freq: Once | SUBLINGUAL | Status: AC
  Administered 2021-10-25: 0.8 mg via SUBLINGUAL

## 2021-11-10 ENCOUNTER — Encounter: Payer: Self-pay | Admitting: Internal Medicine

## 2021-11-16 ENCOUNTER — Encounter: Payer: Self-pay | Admitting: *Deleted

## 2021-11-25 ENCOUNTER — Encounter: Payer: Self-pay | Admitting: Gastroenterology

## 2022-01-03 ENCOUNTER — Ambulatory Visit: Payer: Self-pay | Admitting: Gastroenterology

## 2022-01-03 NOTE — Progress Notes (Deleted)
[facility] Consult Note:  History: Misty Bailey 01/03/2022  Referring provider: Ladell Pier, MD  Reason for consult/chief complaint: No chief complaint on file.   Subjective  HPI: Referred by primary care for chest pain occurring for the last 6 to 9 months.  Urgent care visit in August with nonischemic EKG. Had a cardiology consult, then cardiac CT with calcium score of 0.  Echocardiogram with normal LVEF and valvular function.  Then referred to our office.  Patient has been taking various antacids, Tylenol and NSAIDs for this pain. ***   ROS:  Review of Systems   Past Medical History: Past Medical History:  Diagnosis Date   Anemia    Arthritis 02/2014   Rib Cage   Depression    Dysmenorrhea 05/08/2018   Essential hypertension 07/29/2019   Panic attacks    Suppurative hidradenitis      Past Surgical History: Past Surgical History:  Procedure Laterality Date   FOOT SURGERY     PELVIC LAPAROSCOPY  1998/1999 `   pelvic adhesions and pain     Family History: Family History  Problem Relation Age of Onset   Diabetes Mother    Hypertension Mother    Hyperlipidemia Mother    Heart murmur Mother    Hypertension Father    Stroke Father    Hyperlipidemia Father    Hypertension Maternal Grandmother    Diabetes Maternal Grandmother    Osteoporosis Maternal Grandmother    Thyroid disease Paternal Aunt    Breast cancer Neg Hx     Social History: Social History   Socioeconomic History   Marital status: Single    Spouse name: Not on file   Number of children: 0   Years of education: Associates   Highest education level: Not on file  Occupational History   Occupation: Animal nutritionist: [employer]  Tobacco Use   Smoking status: Former   Smokeless tobacco: Current   Tobacco comments:    Optometrist Use: Never used  Substance and Sexual Activity   Alcohol use: Not Currently    Comment: Occassionally     Drug use: Yes    Types: Marijuana   Sexual activity: Not Currently    Partners: Male    Birth control/protection: None  Other Topics Concern   Not on file  Social History Narrative   Boyfriend incarcerated 2014-2018   Social Determinants of Health   Financial Resource Strain: Not on file  Food Insecurity: Not on file  Transportation Needs: Not on file  Physical Activity: Not on file  Stress: Not on file  Social Connections: Not on file    Allergies: Allergies  Allergen Reactions   Codeine Shortness Of Breath    Outpatient Meds: Current Outpatient Medications  Medication Sig Dispense Refill   albuterol (VENTOLIN HFA) 108 (90 Base) MCG/ACT inhaler Inhale 2 puffs into the lungs every 6 (six) hours as needed for wheezing or shortness of breath. 8 g 4   amLODipine (NORVASC) 5 MG tablet Take 1 tablet (5 mg total) by mouth daily. 30 tablet 4   atorvastatin (LIPITOR) 10 MG tablet Take 1 tablet (10 mg total) by mouth daily. 30 tablet 4   fluticasone (FLONASE) 50 MCG/ACT nasal spray Place 1 spray into both nostrils daily. 9.9 mL 1   metoprolol tartrate (LOPRESSOR) 50 MG tablet Take 1 tablet (50 mg total) by mouth as directed. 1 tablet 0   metroNIDAZOLE (METROGEL VAGINAL)  0.75 % vaginal gel Place 1 Applicatorful vaginally at bedtime. Use nightly at bedtime for 5 days. (Patient not taking: Reported on 10/01/2021) 70 g 0   nitroGLYCERIN (NITROSTAT) 0.4 MG SL tablet Place 1 tablet (0.4 mg total) under the tongue every 5 (five) minutes as needed for chest pain. 30 tablet 0   omeprazole (PRILOSEC) 20 MG capsule Take 1 capsule (20 mg total) by mouth 2 (two) times daily before a meal. 60 capsule 3   No current facility-administered medications for this visit.      ___________________________________________________________________ Objective   Exam:  There were no vitals taken for this visit. Wt Readings from Last 3 Encounters:  10/01/21 136 lb (61.7 kg)  09/17/21 142 lb 3.2 oz  (64.5 kg)  08/17/21 141 lb (64 kg)    General: ***  Eyes: sclera anicteric, no redness ENT: oral mucosa moist without lesions, no cervical or supraclavicular lymphadenopathy CV: RRR without murmur, S1/S2, no JVD, no peripheral edema Resp: clear to auscultation bilaterally, normal RR and effort noted GI: soft, *** tenderness, with active bowel sounds. No guarding or palpable organomegaly noted. Skin; warm and dry, no rash or jaundice noted Neuro: awake, alert and oriented x 3. Normal gross motor function and fluent speech  Labs:  ***  Radiologic Studies: Normal echocardiogram October 2022  Assessment: No diagnosis found.  ***  Plan:  ***  Thank you for the courtesy of this consult.  Please call me with any questions or concerns.  Nelida Meuse III  CC: Referring provider noted above

## 2022-01-25 ENCOUNTER — Encounter: Payer: Self-pay | Admitting: Gastroenterology

## 2022-01-25 ENCOUNTER — Ambulatory Visit (INDEPENDENT_AMBULATORY_CARE_PROVIDER_SITE_OTHER): Payer: Self-pay | Admitting: Gastroenterology

## 2022-01-25 VITALS — BP 104/60 | HR 76 | Ht 62.25 in | Wt 137.2 lb

## 2022-01-25 DIAGNOSIS — R0789 Other chest pain: Secondary | ICD-10-CM

## 2022-01-25 NOTE — Progress Notes (Signed)
[facility] Consult Note:  History: Misty Bailey 01/25/2022  Referring provider: Ladell Pier, MD  Reason for consult/chief complaint: Chest Pain (Consistent for 6 months, Already had cardiac workup, haven't had any pain in this month), Abdominal Pain (Esophageal/upper abd pains was having this every week, would wake pt up out of sleep), and Diarrhea (For the last 2 days)   Subjective  HPI: Misty Bailey was seen in 2018 after an episode of sigmoid diverticulitis as well as asymptomatic gallstones.  Colonoscopy November 2018 with left-sided diverticulosis, a few diminutive erosions around the AO and ICV, otherwise normal exam.  About 6 months intermittent epigastric/chest pain.  Normal 2D echocardiogram.  Cardiac CT calcium score 0  Misty Bailey tells me she had about 6 months of intermittent nonexertional sharp lower chest pain, often occurring overnight and waking her from sleep.  It was not clearly associated with eating or position, pain did not radiate anywhere, no associated dyspnea.  Did not seem to be triggered by movement or breathing, when it would occur it was sometimes so intense it made her feel nauseated.  Last episode was about a month ago, she is not sure why it lately stopped. Occasional loose stools that she attributes to something she eats, denies rectal bleeding.  (Over 15 minutes late to today's visit) ROS:  Review of Systems  Constitutional:  Negative for appetite change and unexpected weight change.  HENT:  Negative for mouth sores and voice change.   Eyes:  Negative for pain and redness.  Respiratory:  Negative for cough and shortness of breath.   Cardiovascular:  Negative for chest pain and palpitations.  Genitourinary:  Negative for dysuria and hematuria.  Musculoskeletal:  Negative for arthralgias and myalgias.  Skin:  Negative for pallor and rash.  Neurological:  Negative for weakness and headaches.  Hematological:  Negative for  adenopathy.    Past Medical History: Past Medical History:  Diagnosis Date   Anemia    Anxiety    Arthritis 02/23/2014   Rib Cage   Depression    Diverticulosis    Dysmenorrhea 05/08/2018   Essential hypertension 07/29/2019   HLD (hyperlipidemia)    Panic attacks    Suppurative hidradenitis      Past Surgical History: Past Surgical History:  Procedure Laterality Date   oral surgery     PELVIC LAPAROSCOPY  1998/1999 `   pelvic adhesions and pain   TOE SURGERY Left    great toe   WISDOM TOOTH EXTRACTION       Family History: Family History  Problem Relation Age of Onset   Diabetes Mother    Hypertension Mother    Hyperlipidemia Mother    Heart murmur Mother    Crohn's disease Mother    Hypertension Father    Stroke Father    Hyperlipidemia Father    Diabetes Sister    Hypertension Sister    Hyperlipidemia Sister    Hypertension Maternal Grandmother    Diabetes Maternal Grandmother    Osteoporosis Maternal Grandmother    Cirrhosis Maternal Grandfather    Thyroid disease Paternal Aunt    Breast cancer Neg Hx     Social History: Social History   Socioeconomic History   Marital status: Single    Spouse name: Not on file   Number of children: 0   Years of education: Associates   Highest education level: Not on file  Occupational History   Not on file  Tobacco Use   Smoking status: Former  Types: Cigarettes    Quit date: 76    Years since quitting: 27.1   Smokeless tobacco: Never   Tobacco comments:    Weed  Scientific laboratory technician Use: Never used  Substance and Sexual Activity   Alcohol use: Yes    Comment: social   Drug use: Yes    Types: Marijuana   Sexual activity: Not Currently    Partners: Male    Birth control/protection: None  Other Topics Concern   Not on file  Social History Narrative   Boyfriend incarcerated 2014-2018   Social Determinants of Health   Financial Resource Strain: Not on file  Food Insecurity: Not on file   Transportation Needs: Not on file  Physical Activity: Not on file  Stress: Not on file  Social Connections: Not on file    Allergies: Allergies  Allergen Reactions   Codeine Shortness Of Breath    Outpatient Meds: Current Outpatient Medications  Medication Sig Dispense Refill   albuterol (VENTOLIN HFA) 108 (90 Base) MCG/ACT inhaler Inhale 2 puffs into the lungs every 6 (six) hours as needed for wheezing or shortness of breath. 8 g 4   amLODipine (NORVASC) 5 MG tablet Take 1 tablet (5 mg total) by mouth daily. 30 tablet 4   atorvastatin (LIPITOR) 10 MG tablet Take 1 tablet (10 mg total) by mouth daily. 30 tablet 4   fluticasone (FLONASE) 50 MCG/ACT nasal spray Place 1 spray into both nostrils daily. 9.9 mL 1   omeprazole (PRILOSEC) 20 MG capsule Take 1 capsule (20 mg total) by mouth 2 (two) times daily before a meal. 60 capsule 3   nitroGLYCERIN (NITROSTAT) 0.4 MG SL tablet Place 1 tablet (0.4 mg total) under the tongue every 5 (five) minutes as needed for chest pain. (Patient not taking: Reported on 01/25/2022) 30 tablet 0   No current facility-administered medications for this visit.      ___________________________________________________________________ Objective   Exam:  BP 104/60 (BP Location: Left Arm, Patient Position: Sitting, Cuff Size: Normal)    Pulse 76    Ht 5' 2.25" (1.581 m) Comment: height measured without shoes   Wt 137 lb 4 oz (62.3 kg)    BMI 24.90 kg/m  Wt Readings from Last 3 Encounters:  01/25/22 137 lb 4 oz (62.3 kg)  10/01/21 136 lb (61.7 kg)  09/17/21 142 lb 3.2 oz (64.5 kg)    General: Well-appearing Eyes: sclera anicteric, no redness ENT: oral mucosa moist without lesions, no cervical or supraclavicular lymphadenopathy CV: RRR without murmur, S1/S2, no JVD, no peripheral edema She points to a focal area over the xiphoid process where the pain has occurred, and said during episodes he was often tender in that area, as it was on exam today.  No  palpable fullness or skin changes in that area Resp: clear to auscultation bilaterally, normal RR and effort noted GI: soft, no tenderness, with active bowel sounds. No guarding or palpable organomegaly noted. Skin; warm and dry, no rash or jaundice noted Neuro: awake, alert and oriented x 3. Normal gross motor function and fluent speech  Labs:  CBC Latest Ref Rng & Units 08/17/2021 02/03/2021 04/30/2020  WBC 3.4 - 10.8 x10E3/uL 5.7 5.3 6.5  Hemoglobin 11.1 - 15.9 g/dL 13.6 13.6 13.7  Hematocrit 34.0 - 46.6 % 42.3 40.9 41.1  Platelets 150 - 450 x10E3/uL 312 296 290   CMP Latest Ref Rng & Units 10/01/2021 08/17/2021 02/03/2021  Glucose 70 - 99 mg/dL 98 87 98  BUN  6 - 24 mg/dL 18 11 13   Creatinine 0.57 - 1.00 mg/dL 1.16(H) 1.08(H) 0.97  Sodium 134 - 144 mmol/L 142 142 139  Potassium 3.5 - 5.2 mmol/L 4.2 4.0 4.6  Chloride 96 - 106 mmol/L 107(H) 105 107(H)  CO2 20 - 29 mmol/L 21 20 21   Calcium 8.7 - 10.2 mg/dL 9.7 9.5 9.3  Total Protein 6.0 - 8.5 g/dL - 6.7 6.5  Total Bilirubin 0.0 - 1.2 mg/dL - 0.4 0.4  Alkaline Phos 44 - 121 IU/L - 87 81  AST 0 - 40 IU/L - 14 13  ALT 0 - 32 IU/L - 10 11     Assessment: Encounter Diagnosis  Name Primary?   Chest wall pain Yes    Chest wall pain based on history and examination.  It does not sound digestive in nature.  When I mention that, she seem to recall having had a similar problem in the past. Recommended primary care advice regarding treatment, she will return to see me as needed.   Thank you for the courtesy of this consult.  Please call me with any questions or concerns.  Nelida Meuse III  CC: Referring provider noted above

## 2022-01-25 NOTE — Patient Instructions (Signed)
If you are age 53 or older, your body mass index should be between 23-30. Your Body mass index is 24.9 kg/m. If this is out of the aforementioned range listed, please consider follow up with your Primary Care Provider.  If you are age 70 or younger, your body mass index should be between 19-25. Your Body mass index is 24.9 kg/m. If this is out of the aformentioned range listed, please consider follow up with your Primary Care Provider.   ________________________________________________________  The [facility] providers would like to encourage you to use [facility] to communicate with providers for non-urgent requests or questions.  Due to long hold times on the telephone, sending your provider a message by [facility] may be a faster and more efficient way to get a response.  Please allow 48 business hours for a response.  Please remember that this is for non-urgent requests.  _______________________________________________________  It was a pleasure to see you today!  Thank you for trusting me with your gastrointestinal care!

## 2022-04-22 ENCOUNTER — Ambulatory Visit: Payer: Self-pay

## 2022-04-22 NOTE — Telephone Encounter (Addendum)
Chief Complaint: HTN ?Symptoms: HTN - mild HA ?Frequency: ongoing ?Pertinent Negatives: Patient denies SOB ?Disposition: '[]'$ ED /'[x]'$ Urgent Care (no appt availability in office) / '[]'$ Appointment(In office/virtual)/ '[]'$  [facility]/ '[]'$ Home Care/ '[]'$ Refused Recommended Disposition /'[]'$ [place] [facility]/ '[]'$  Follow-up with PCP ?Additional Notes: Pt has a history of HTN and until this last week was not taking her medication regularly. She was also not measuring her blood pressure regularly. Pt has been doing both this past week and has found her blood pressures to be elevated. Most seem to be I the 140 - 150/  88-102. Pt will go to UC for care. Made follow up appt for perimenopause complaints, and HTN. ?  ?  ?  ?  ?   ?Summary: BP concerns  ?  Patient experiencing  BP of 152/88, fatigue, unable to sleep, hot flashes, hormonal change seeking clinical advice.   ?  ?  ?  ? ?Blood Pressure - High -A - AH ? ?Blood pressure: What was the blood pressure?  Pt has been taking BP this week. Today was 152/88. Other pressures have been 140-150/88-102. ?When did you take your blood pressure? This morning upon waking. ?How: How did you obtain BP? Automatic wrist cuff. ?History: Do you have a history of HTN? Yes - Has recently started taking medication regularly and measuring BP. Not missed any this week. ?Medications : Are you taking any medications for BP? Yes Have you missed any does? No ?6. Other symptoms: Do you have a any symptoms? (E.g. HA, chest pain, blurred vision, difficulty breathing weakness)? Mild HA ?Pregnancy: IS there any chance you are pregnant?no ?

## 2022-04-22 NOTE — Telephone Encounter (Signed)
Fyi.

## 2022-04-30 ENCOUNTER — Ambulatory Visit (HOSPITAL_COMMUNITY)
Admission: RE | Admit: 2022-04-30 | Discharge: 2022-04-30 | Disposition: A | Discharge status: Home / Self Care | Payer: Commercial Managed Care - HMO | Source: Ambulatory Visit | Attending: Internal Medicine | Admitting: Internal Medicine

## 2022-04-30 ENCOUNTER — Encounter (HOSPITAL_COMMUNITY): Payer: Self-pay

## 2022-04-30 VITALS — BP 148/92 | HR 87 | Temp 98.4°F | Resp 17

## 2022-04-30 DIAGNOSIS — I10 Essential (primary) hypertension: Secondary | ICD-10-CM | POA: Diagnosis not present

## 2022-04-30 NOTE — ED Provider Notes (Signed)
?[facility] ? ? ? ?CSN: [id number] ?Arrival date & time: 04/30/22  1017 ? ? ?  ? ?History   ?Chief Complaint ?Chief Complaint  ?Patient presents with  ? appt 1030  ? Hypertension  ? ? ?HPI ?Misty Bailey is a 51 y.o. female who presents with concern of elevated BP since 4/23. The reading she has been recording are mostly first thing in the am before taking her Amlodipine. Her PCP told her to come here since her dose may need to be increased.   ?143/88, 124/70,  ?4/26 7 am 150/102 and same day was 8am 144/107, 10 am 163/100 ?3:30 149/93, ?4/27 144/ 87, 148/84, 152/88,  ?5/2 134/87 ?5/4 141/88 ?5/6 151/96 , after taking her med 136/101 ? ?Sometimes has a slight headache and occasional dizzy ? ?Past Medical History:  ?Diagnosis Date  ? Anemia   ? Anxiety   ? Arthritis 02/23/2014  ? Rib Cage  ? Depression   ? Diverticulosis   ? Dysmenorrhea 05/08/2018  ? Essential hypertension 07/29/2019  ? HLD (hyperlipidemia)   ? Panic attacks   ? Suppurative hidradenitis   ? ? ?Patient Active Problem List  ? Diagnosis Date Noted  ? Influenza vaccine refused 01/04/2021  ? Inguinal hernia of left side without obstruction or gangrene 04/30/2020  ? Hyperlipidemia 07/30/2019  ? Syncope 07/29/2019  ? Essential hypertension 07/29/2019  ? Dysmenorrhea 05/08/2018  ? Herpes 03/21/2014  ? Suppurative hidradenitis   ? Depression   ? ? ?Past Surgical History:  ?Procedure Laterality Date  ? oral surgery    ? PELVIC LAPAROSCOPY  1998/1999 `  ? pelvic adhesions and pain  ? TOE SURGERY Left   ? great toe  ? WISDOM TOOTH EXTRACTION    ? ? ?OB History   ? ? Gravida  ?2  ? Para  ?1  ? Term  ?   ? Preterm  ?1  ? AB  ?1  ? Living  ?0  ?  ? ? SAB  ?1  ? IAB  ?   ? Ectopic  ?   ? Multiple  ?   ? Live Births  ?   ?   ?  ?  ? ? ? ?Home Medications   ? ?Prior to Admission medications   ?Medication Sig Start Date End Date Taking? Authorizing Provider  ?albuterol (VENTOLIN HFA) 108 (90 Base) MCG/ACT inhaler Inhale 2 puffs into the lungs every 6  (six) hours as needed for wheezing or shortness of breath. 09/03/20   Ladell Pier, MD  ?amLODipine (NORVASC) 5 MG tablet Take 1 tablet (5 mg total) by mouth daily. 08/17/21   Ladell Pier, MD  ?atorvastatin (LIPITOR) 10 MG tablet Take 1 tablet (10 mg total) by mouth daily. 08/17/21   Ladell Pier, MD  ?fluticasone (FLONASE) 50 MCG/ACT nasal spray Place 1 spray into both nostrils daily. 09/03/20   Ladell Pier, MD  ?nitroGLYCERIN (NITROSTAT) 0.4 MG SL tablet Place 1 tablet (0.4 mg total) under the tongue every 5 (five) minutes as needed for chest pain. ?Patient not taking: Reported on 01/25/2022 10/01/21   Early Osmond, MD  ?omeprazole (PRILOSEC) 20 MG capsule Take 1 capsule (20 mg total) by mouth 2 (two) times daily before a meal. 09/17/21   Ladell Pier, MD  ? ? ?Family History ?Family History  ?Problem Relation Age of Onset  ? Diabetes Mother   ? Hypertension Mother   ? Hyperlipidemia Mother   ? Heart murmur Mother   ?  Crohn's disease Mother   ? Hypertension Father   ? Stroke Father   ? Hyperlipidemia Father   ? Diabetes Sister   ? Hypertension Sister   ? Hyperlipidemia Sister   ? Hypertension Maternal Grandmother   ? Diabetes Maternal Grandmother   ? Osteoporosis Maternal Grandmother   ? Cirrhosis Maternal Grandfather   ? Thyroid disease Paternal Aunt   ? Breast cancer Neg Hx   ? ? ?Social History ?Social History  ? ?Tobacco Use  ? Smoking status: Former  ?  Types: Cigarettes  ?  Quit date: 36  ?  Years since quitting: 27.3  ? Smokeless tobacco: Never  ? Tobacco comments:  ?  Weed  ?Vaping Use  ? Vaping Use: Never used  ?Substance Use Topics  ? Alcohol use: Yes  ?  Comment: social  ? Drug use: Yes  ?  Types: Marijuana  ? ? ? ?Allergies   ?Codeine ? ? ?Review of Systems ?Review of Systems  ?Psychiatric/Behavioral:  Positive for sleep disturbance.   ?     Due to having night sweats and hot flushes  ?Review of Systems  ?Constitutional: Negative for diaphoresis and unexpected weight  change.  ?HENT: Negative for tinnitus.   ?Eyes: Negative for visual disturbance.  ?Respiratory: Negative for chest tightness and shortness of breath.   ?Cardiovascular: Negative for chest pain, palpitations and leg swelling.  ?Gastrointestinal: Negative for constipation, diarrhea and nausea.  ?Endocrine: Negative for polydipsia, polyphagia and polyuria. Has periods q 3-=45 day.   ?Genitourinary: Negative for dysuria and frequency.  ?Skin: Negative for rash and wound.  ?Neurological: Negative for dizziness, speech difficulty, weakness, numbness and headaches.  ? ? ?Physical Exam ?Triage Vital Signs ?ED Triage Vitals  ?Enc Vitals Group  ?   BP 04/30/22 1106 (!) 148/92  ?   Pulse Rate 04/30/22 1106 87  ?   Resp 04/30/22 1106 17  ?   Temp 04/30/22 1106 98.4 ?F (36.9 ?C)  ?   Temp src --   ?   SpO2 04/30/22 1106 98 %  ?   Weight --   ?   Height --   ?   Head Circumference --   ?   Peak Flow --   ?   Pain Score 04/30/22 1107 0  ?   Pain Loc --   ?   Pain Edu? --   ?   Excl. in [place]? --   ? ?No data found. ? ?Updated Vital Signs ?BP (!) 148/92 (BP Location: Left Arm)   Pulse 87   Temp 98.4 ?F (36.9 ?C)   Resp 17   LMP 04/22/2022   SpO2 98%  ? ?Visual Acuity ?Right Eye Distance:   ?Left Eye Distance:   ?Bilateral Distance:   ? ?Right Eye Near:   ?Left Eye Near:    ?Bilateral Near:    ? ?Physical Exam ? ?Constitutional: She is oriented to person, place, and time. She appears well-developed and well-nourished. No distress.  ?HENT:  ?Head: Normocephalic and atraumatic.  ?Right Ear: External ear normal.  ?Left Ear: External ear normal.  ?Nose: Nose normal.  ?Eyes: Conjunctivae are normal. Right eye exhibits no discharge. Left eye exhibits no discharge. No scleral icterus.  ?Neck: Neck supple. No thyromegaly present.  ?Cardiovascular: Normal rate and regular rhythm.  ?No murmur heard. ?Pulmonary/Chest: Effort normal and breath sounds normal. No respiratory distress.  ?Musculoskeletal: Normal range of motion. She exhibits no  edema.  ?Lymphadenopathy: She has no cervical adenopathy.  ?Neurological:  She is alert and oriented to person, place, and time.  ?Skin: Skin is warm and dry. Capillary refill takes less than 2 seconds. No rash noted. She is not diaphoretic.  ?Psychiatric: She has a normal mood and affect. Her behavior is normal. Judgment and thought content normal.  ?Nursing note reviewed.  ? ?UC Treatments / Results  ?Labs ?(all labs ordered are listed, but only abnormal results are displayed) ?Labs Reviewed - No data to display ? ?EKG ? ? ?Radiology ?No results found. ? ?Procedures ?Procedures (including critical care time) ? ?Medications Ordered in UC ?Medications - No data to display ? ?Initial Impression / Assessment and Plan / UC Course  ?I have reviewed the triage vital signs and the nursing notes. ? ?I educated her that she needs to record her BP 2-3 hours after taking her medication. She is to take those readings to her pcp when her appointment is coming in a few days.  ?Final Clinical Impressions(s) / UC Diagnoses  ? ?Final diagnoses:  ?Essential hypertension  ? ?Discharge Instructions   ?None ?  ? ?ED Prescriptions   ?None ?  ? ?PDMP not reviewed this encounter. ?  ?Shelby Mattocks, PA-C ?04/30/22 1139 ? ?

## 2022-04-30 NOTE — ED Triage Notes (Signed)
Pt reports that she hasnt been taking her Amlodipine regularly until past week. Reports that when wakes up in mornings has slight headache. Reports doesn't see PC until end of June and was advised to come to UC to get regulated. Keeping up with BP 580-063 systolic in mornings when wakes up before taking medications. Pt not checking BP after takes medications.   ?

## 2022-06-03 ENCOUNTER — Other Ambulatory Visit: Payer: Self-pay

## 2022-06-08 ENCOUNTER — Ambulatory Visit: Payer: Commercial Managed Care - HMO | Attending: Nurse Practitioner | Admitting: Nurse Practitioner

## 2022-06-08 ENCOUNTER — Encounter: Payer: Self-pay | Admitting: Nurse Practitioner

## 2022-06-08 ENCOUNTER — Other Ambulatory Visit: Payer: Self-pay

## 2022-06-08 VITALS — BP 135/85 | HR 76 | Wt 137.8 lb

## 2022-06-08 DIAGNOSIS — B9689 Other specified bacterial agents as the cause of diseases classified elsewhere: Secondary | ICD-10-CM | POA: Diagnosis not present

## 2022-06-08 DIAGNOSIS — I10 Essential (primary) hypertension: Secondary | ICD-10-CM | POA: Diagnosis not present

## 2022-06-08 DIAGNOSIS — N760 Acute vaginitis: Secondary | ICD-10-CM | POA: Diagnosis not present

## 2022-06-08 DIAGNOSIS — J329 Chronic sinusitis, unspecified: Secondary | ICD-10-CM

## 2022-06-08 MED ORDER — AMOXICILLIN-POT CLAVULANATE 875-125 MG PO TABS
1.0000 | ORAL_TABLET | Freq: Two times a day (BID) | ORAL | 0 refills | Status: AC
  Filled 2022-06-08: qty 14, 7d supply, fill #0

## 2022-06-08 MED ORDER — DEBROX 6.5 % OT SOLN
5.0000 [drp] | Freq: Two times a day (BID) | OTIC | 0 refills | Status: DC
  Filled 2022-06-08: qty 15, 30d supply, fill #0

## 2022-06-08 MED ORDER — FLUTICASONE PROPIONATE 50 MCG/ACT NA SUSP
1.0000 | Freq: Every day | NASAL | 1 refills | Status: DC
  Filled 2022-06-08: qty 16, 30d supply, fill #0

## 2022-06-08 NOTE — Progress Notes (Signed)
Assessment & Plan:  Misty Bailey was seen today for hypertension.  Diagnoses and all orders for this visit:  Essential hypertension -     CMP14+EGFR; Future  Bacterial sinusitis -     fluticasone (FLONASE) 50 MCG/ACT nasal spray; Place 1 spray into both nostrils daily.  Acute vaginitis -     Cervicovaginal ancillary only; Future  Other orders -     amoxicillin-clavulanate (AUGMENTIN) 875-125 MG tablet; Take 1 tablet by mouth 2 (two) times daily for 7 days. -     carbamide peroxide (DEBROX) 6.5 % OTIC solution; Place 5 drops into the right ear 2 (two) times daily.    Patient has been counseled on age-appropriate routine health concerns for screening and prevention. These are reviewed and up-to-date. Referrals have been placed accordingly. Immunizations are up-to-date or declined.    Subjective:   Chief Complaint  Patient presents with   Hypertension    Misty Bailey 51 y.o. female presents to office today for follow up to HTN. She also has complaints of sinusitis and vaginitis sympto She has a past medical history of Anemia, Anxiety, Arthritis (02/23/2014), Depression, Diverticulosis, Dysmenorrhea (05/08/2018), Essential hypertension (07/29/2019), HLD, Panic attacks, and Suppurative hidradenitis. Marland Kitchen   HTN Reports higher readings at home 150/90-100s.  She also states she has not been taking her amlodipine daily as prescribed.  At this time we will not make any changes with her blood pressure medication as she has not been taking this consistently.  I have instructed her to take her blood pressure medicine every day as prescribed and monitor her blood pressure over the next 2 weeks and send average BP readings to PCP to determine if amlodipine needs to be increased or not at that time.  BP Readings from Last 3 Encounters:  06/08/22 135/85  04/30/22 (!) 148/92  01/25/22 104/60    Sinus Pain: Patient complains of right ear pain, achiness, swollen lymph nodes in the neck, headache  described as mild, nasal congestion, post nasal drip, purulent nasal discharge, sinus pressure, and sore throat.Onset of symptoms was 2 weeks ago, waxing and waning since that time.  Patient is not a smoker.  Vaginitis: Patient complains of an abnormal vaginal discharge for a few weeks. Vaginal symptoms include local irritation and odor. Marland KitchenSTI Risk: Possible STD exposure.  Discharge described as: thick.Other associated symptoms: none.Menstrual pattern: She had been bleeding regularly. Contraception: none    Review of Systems  Constitutional:  Positive for malaise/fatigue. Negative for chills and fever.  HENT:  Positive for congestion, ear pain, sinus pain and sore throat. Negative for ear discharge and hearing loss.   Eyes: Negative.   Respiratory:  Positive for cough. Negative for sputum production, shortness of breath and wheezing.   Cardiovascular: Negative.  Negative for chest pain, orthopnea and leg swelling.  Gastrointestinal: Negative.  Negative for abdominal pain, diarrhea, nausea and vomiting.  Genitourinary:        See HPI  Neurological:  Positive for headaches. Negative for dizziness and focal weakness.  Endo/Heme/Allergies:  Negative for environmental allergies.  Psychiatric/Behavioral: Negative.      Past Medical History:  Diagnosis Date   Anemia    Anxiety    Arthritis 02/23/2014   Rib Cage   Depression    Diverticulosis    Dysmenorrhea 05/08/2018   Essential hypertension 07/29/2019   HLD (hyperlipidemia)    Panic attacks    Suppurative hidradenitis     Past Surgical History:  Procedure Laterality Date   oral surgery  PELVIC LAPAROSCOPY  1998/1999 `   pelvic adhesions and pain   TOE SURGERY Left    great toe   WISDOM TOOTH EXTRACTION      Family History  Problem Relation Age of Onset   Diabetes Mother    Hypertension Mother    Hyperlipidemia Mother    Heart murmur Mother    Crohn's disease Mother    Hypertension Father    Stroke Father     Hyperlipidemia Father    Diabetes Sister    Hypertension Sister    Hyperlipidemia Sister    Hypertension Maternal Grandmother    Diabetes Maternal Grandmother    Osteoporosis Maternal Grandmother    Cirrhosis Maternal Grandfather    Thyroid disease Paternal Aunt    Breast cancer Neg Hx     Social History Reviewed with no changes to be made today.   Outpatient Medications Prior to Visit  Medication Sig Dispense Refill   albuterol (VENTOLIN HFA) 108 (90 Base) MCG/ACT inhaler Inhale 2 puffs into the lungs every 6 (six) hours as needed for wheezing or shortness of breath. 8 g 4   amLODipine (NORVASC) 5 MG tablet Take 1 tablet (5 mg total) by mouth daily. 30 tablet 4   atorvastatin (LIPITOR) 10 MG tablet Take 1 tablet (10 mg total) by mouth daily. 30 tablet 4   nitroGLYCERIN (NITROSTAT) 0.4 MG SL tablet Place 1 tablet (0.4 mg total) under the tongue every 5 (five) minutes as needed for chest pain. 30 tablet 0   fluticasone (FLONASE) 50 MCG/ACT nasal spray Place 1 spray into both nostrils daily. 9.9 mL 1   omeprazole (PRILOSEC) 20 MG capsule Take 1 capsule (20 mg total) by mouth 2 (two) times daily before a meal. (Patient not taking: Reported on 06/08/2022) 60 capsule 3   No facility-administered medications prior to visit.    Allergies  Allergen Reactions   Codeine Shortness Of Breath       Objective:    BP 135/85   Pulse 76   Wt 137 lb 12.8 oz (62.5 kg)   SpO2 95%   BMI 25.00 kg/m  Wt Readings from Last 3 Encounters:  06/08/22 137 lb 12.8 oz (62.5 kg)  01/25/22 137 lb 4 oz (62.3 kg)  10/01/21 136 lb (61.7 kg)    Physical Exam Constitutional:      Appearance: She is well-developed. She is not ill-appearing.  HENT:     Head: Normocephalic.     Right Ear: Hearing, ear canal and external ear normal. No middle ear effusion. There is impacted cerumen.     Left Ear: Hearing, ear canal and external ear normal.  No middle ear effusion.     Nose: Mucosal edema and rhinorrhea  present.     Right Turbinates: Enlarged, swollen and pale.     Left Turbinates: Enlarged, swollen and pale.     Right Sinus: Frontal sinus tenderness present. No maxillary sinus tenderness.     Left Sinus: Frontal sinus tenderness present. No maxillary sinus tenderness.     Mouth/Throat:     Pharynx: Posterior oropharyngeal erythema present. No oropharyngeal exudate.     Tonsils: No tonsillar abscesses.  Neck:     Thyroid: No thyromegaly.  Cardiovascular:     Rate and Rhythm: Normal rate and regular rhythm.     Heart sounds: No murmur heard.    No friction rub. No gallop.  Pulmonary:     Effort: Pulmonary effort is normal. No respiratory distress.     Breath  sounds: Normal breath sounds. No wheezing or rales.  Chest:     Chest wall: No tenderness.  Abdominal:     General: Bowel sounds are normal.     Palpations: Abdomen is soft.  Musculoskeletal:        General: Normal range of motion.     Cervical back: Normal range of motion.  Lymphadenopathy:     Head:     Right side of head: Tonsillar adenopathy present.     Left side of head: Tonsillar adenopathy present.     Cervical: No cervical adenopathy.  Skin:    General: Skin is warm and dry.  Neurological:     Mental Status: She is alert and oriented to person, place, and time.  Psychiatric:        Behavior: Behavior normal.        Thought Content: Thought content normal.        Judgment: Judgment normal.          Patient has been counseled extensively about nutrition and exercise as well as the importance of adherence with medications and regular follow-up. The patient was given clear instructions to go to ER or return to medical center if symptoms don't improve, worsen or new problems develop. The patient verbalized understanding.   Follow-up: Return in about 3 months (around 09/08/2022).   Gildardo Pounds, FNP-BC [facility] [place], [place]   06/08/2022, 5:23 PM

## 2022-06-09 ENCOUNTER — Other Ambulatory Visit: Payer: Self-pay

## 2022-06-20 ENCOUNTER — Ambulatory Visit: Payer: BC Managed Care – PPO | Attending: Internal Medicine

## 2022-06-20 ENCOUNTER — Other Ambulatory Visit (HOSPITAL_COMMUNITY)
Admission: RE | Admit: 2022-06-20 | Discharge: 2022-06-20 | Disposition: A | Discharge status: Home / Self Care | Payer: Commercial Managed Care - HMO | Source: Ambulatory Visit | Attending: Nurse Practitioner | Admitting: Nurse Practitioner

## 2022-06-20 DIAGNOSIS — B9689 Other specified bacterial agents as the cause of diseases classified elsewhere: Secondary | ICD-10-CM | POA: Diagnosis not present

## 2022-06-20 DIAGNOSIS — N760 Acute vaginitis: Secondary | ICD-10-CM | POA: Insufficient documentation

## 2022-06-20 DIAGNOSIS — B3731 Acute candidiasis of vulva and vagina: Secondary | ICD-10-CM | POA: Insufficient documentation

## 2022-06-20 DIAGNOSIS — I10 Essential (primary) hypertension: Secondary | ICD-10-CM | POA: Diagnosis not present

## 2022-06-21 ENCOUNTER — Other Ambulatory Visit: Payer: Self-pay

## 2022-06-21 DIAGNOSIS — N760 Acute vaginitis: Secondary | ICD-10-CM

## 2022-06-21 LAB — CMP14+EGFR
ALT: 12 IU/L (ref 0–32)
AST: 16 IU/L (ref 0–40)
Albumin/Globulin Ratio: 2 (ref 1.2–2.2)
Albumin: 4.5 g/dL (ref 3.8–4.9)
Alkaline Phosphatase: 91 IU/L (ref 44–121)
BUN/Creatinine Ratio: 14 (ref 9–23)
BUN: 16 mg/dL (ref 6–24)
Bilirubin Total: 0.5 mg/dL (ref 0.0–1.2)
CO2: 22 mmol/L (ref 20–29)
Calcium: 9.6 mg/dL (ref 8.7–10.2)
Chloride: 105 mmol/L (ref 96–106)
Creatinine, Ser: 1.13 mg/dL — ABNORMAL HIGH (ref 0.57–1.00)
Globulin, Total: 2.2 g/dL (ref 1.5–4.5)
Glucose: 103 mg/dL — ABNORMAL HIGH (ref 70–99)
Potassium: 4.5 mmol/L (ref 3.5–5.2)
Sodium: 140 mmol/L (ref 134–144)
Total Protein: 6.7 g/dL (ref 6.0–8.5)
eGFR: 59 mL/min/{1.73_m2} — ABNORMAL LOW (ref >59)

## 2022-06-23 LAB — CERVICOVAGINAL ANCILLARY ONLY
Bacterial Vaginitis (gardnerella): POSITIVE — AB
Candida Glabrata: NEGATIVE
Candida Vaginitis: POSITIVE — AB
Chlamydia: NEGATIVE
Comment: NEGATIVE
Comment: NEGATIVE
Comment: NEGATIVE
Comment: NEGATIVE
Comment: NEGATIVE
Comment: NORMAL
Neisseria Gonorrhea: NEGATIVE
Trichomonas: NEGATIVE

## 2022-06-24 ENCOUNTER — Other Ambulatory Visit: Payer: Self-pay | Admitting: Nurse Practitioner

## 2022-06-24 DIAGNOSIS — B3731 Acute candidiasis of vulva and vagina: Secondary | ICD-10-CM

## 2022-06-24 DIAGNOSIS — B9689 Other specified bacterial agents as the cause of diseases classified elsewhere: Secondary | ICD-10-CM

## 2022-06-24 MED ORDER — METRONIDAZOLE 500 MG PO TABS: 500.0000 mg | ORAL_TABLET | Freq: Two times a day (BID) | ORAL | 0 refills | Status: DC

## 2022-06-24 MED ORDER — FLUCONAZOLE 150 MG PO TABS: 150.0000 mg | ORAL_TABLET | Freq: Once | ORAL | 0 refills | Status: AC

## 2022-06-27 ENCOUNTER — Other Ambulatory Visit: Payer: Self-pay

## 2022-06-27 ENCOUNTER — Telehealth: Payer: Self-pay | Admitting: Internal Medicine

## 2022-06-27 ENCOUNTER — Encounter: Payer: Self-pay | Admitting: Nurse Practitioner

## 2022-06-27 ENCOUNTER — Other Ambulatory Visit: Payer: Self-pay | Admitting: Nurse Practitioner

## 2022-06-27 DIAGNOSIS — B9689 Other specified bacterial agents as the cause of diseases classified elsewhere: Secondary | ICD-10-CM

## 2022-06-27 MED ORDER — METRONIDAZOLE 0.75 % VA GEL: 1.0000 | Freq: Two times a day (BID) | VAGINAL | 0 refills | Status: AC

## 2022-06-27 NOTE — Telephone Encounter (Unsigned)
Copied from [place] [phone]. Topic: General - Other >> Jun 27, 2022  1:35 PM Ja-Kwan M wrote: Reason for CRM: Pt stated the metroNIDAZOLE (FLAGYL) 500 MG tablet is harsh on her stomach so she would like to ask that the Rx be sent in for the gel instead.

## 2022-06-30 NOTE — Telephone Encounter (Signed)
Left message on voicemail to return call.

## 2022-08-17 ENCOUNTER — Other Ambulatory Visit: Payer: Self-pay

## 2022-09-01 ENCOUNTER — Ambulatory Visit (HOSPITAL_COMMUNITY)
Admission: RE | Admit: 2022-09-01 | Discharge: 2022-09-01 | Disposition: A | Discharge status: Home / Self Care | Payer: BC Managed Care – PPO | Source: Ambulatory Visit | Attending: Family Medicine | Admitting: Family Medicine

## 2022-09-01 VITALS — BP 127/82 | HR 71 | Temp 98.5°F | Resp 18

## 2022-09-01 DIAGNOSIS — R102 Pelvic and perineal pain: Secondary | ICD-10-CM | POA: Insufficient documentation

## 2022-09-01 DIAGNOSIS — N760 Acute vaginitis: Secondary | ICD-10-CM | POA: Diagnosis not present

## 2022-09-01 DIAGNOSIS — J0100 Acute maxillary sinusitis, unspecified: Secondary | ICD-10-CM | POA: Diagnosis not present

## 2022-09-01 LAB — POCT URINALYSIS DIPSTICK, ED / UC
Bilirubin Urine: NEGATIVE
Glucose, UA: NEGATIVE mg/dL
Ketones, ur: NEGATIVE mg/dL
Leukocytes,Ua: NEGATIVE
Nitrite: NEGATIVE
Protein, ur: NEGATIVE mg/dL
Specific Gravity, Urine: >=1.03 (ref 1.005–1.030)
Urobilinogen, UA: 1 mg/dL (ref 0.0–1.0)
pH: 5.5 (ref 5.0–8.0)

## 2022-09-01 LAB — POC URINE PREG, ED: Preg Test, Ur: NEGATIVE

## 2022-09-01 MED ORDER — CEFDINIR 300 MG PO CAPS: 600.0000 mg | ORAL_CAPSULE | Freq: Every day | ORAL | 0 refills | Status: AC

## 2022-09-01 MED ORDER — FLUCONAZOLE 150 MG PO TABS: 150.0000 mg | ORAL_TABLET | ORAL | 0 refills | Status: AC

## 2022-09-01 NOTE — ED Provider Notes (Signed)
[place]    CSN: [id number] Arrival date & time: 09/01/22  1914      History   Chief Complaint Chief Complaint  Patient presents with   appt 930   Nasal Congestion   Ear Fullness    HPI Misty Bailey is a 51 y.o. female.    Ear Fullness   Here for 2 things.  First early in August she had an upper respiratory infection with fever.  The fever did resolve within the next 7 to 10 days, but she has continued to have postnasal drainage and sore throat, nasal congestion and cough, and sinus pressure.  There is been no acute worsening in the last 2 to 3 days.  She is also not developed a new fever.  No nausea or vomiting  .  She also has had some slight vaginal discharge and pelvic pain in the last 2 months.  At the end of June she was evaluated with a swab and was found to have BV and yeast vaginitis.  She was treated with vaginal MetroGel and with Diflucan.  She has continued to have the pain in the discharge since then.  No dysuria but may be some urinary frequency at night. Last menstrual cycle was 2 or 3 months ago.  No constipation, and no loose stools.  She has maybe had an increased frequency of stooling.   Past Medical History:  Diagnosis Date   Anemia    Anxiety    Arthritis 02/23/2014   Rib Cage   Depression    Diverticulosis    Dysmenorrhea 05/08/2018   Essential hypertension 07/29/2019   HLD (hyperlipidemia)    Panic attacks    Suppurative hidradenitis     Patient Active Problem List   Diagnosis Date Noted   Influenza vaccine refused 01/04/2021   Inguinal hernia of left side without obstruction or gangrene 04/30/2020   Hyperlipidemia 07/30/2019   Syncope 07/29/2019   Essential hypertension 07/29/2019   Dysmenorrhea 05/08/2018   Herpes 03/21/2014   Suppurative hidradenitis    Depression     Past Surgical History:  Procedure Laterality Date   oral surgery     PELVIC LAPAROSCOPY  1998/1999 `   pelvic adhesions and pain   TOE  SURGERY Left    great toe   WISDOM TOOTH EXTRACTION      OB History     Gravida  2   Para  1   Term      Preterm  1   AB  1   Living  0      SAB  1   IAB      Ectopic      Multiple      Live Births               Home Medications    Prior to Admission medications   Medication Sig Start Date End Date Taking? Authorizing Provider  cefdinir (OMNICEF) 300 MG capsule Take 2 capsules (600 mg total) by mouth daily for 7 days. 09/01/22 09/08/22 Yes Desiderio Dolata, Gwenlyn Perking, MD  fluconazole (DIFLUCAN) 150 MG tablet Take 1 tablet (150 mg total) by mouth every 3 (three) days for 2 doses. 09/01/22 09/05/22 Yes Marybell Robards, Gwenlyn Perking, MD  albuterol (VENTOLIN HFA) 108 (90 Base) MCG/ACT inhaler Inhale 2 puffs into the lungs every 6 (six) hours as needed for wheezing or shortness of breath. 09/03/20   Ladell Pier, MD  amLODipine (NORVASC) 5 MG tablet Take 1 tablet (5 mg  total) by mouth daily. 08/17/21   Ladell Pier, MD  atorvastatin (LIPITOR) 10 MG tablet Take 1 tablet (10 mg total) by mouth daily. 08/17/21   Ladell Pier, MD  carbamide peroxide (DEBROX) 6.5 % OTIC solution Place 5 drops into the right ear 2 (two) times daily. 06/08/22   Gildardo Pounds, NP  fluticasone (FLONASE) 50 MCG/ACT nasal spray Place 1 spray into both nostrils daily. 06/08/22   Gildardo Pounds, NP  nitroGLYCERIN (NITROSTAT) 0.4 MG SL tablet Place 1 tablet (0.4 mg total) under the tongue every 5 (five) minutes as needed for chest pain. 10/01/21   Early Osmond, MD    Family History Family History  Problem Relation Age of Onset   Diabetes Mother    Hypertension Mother    Hyperlipidemia Mother    Heart murmur Mother    Crohn's disease Mother    Hypertension Father    Stroke Father    Hyperlipidemia Father    Diabetes Sister    Hypertension Sister    Hyperlipidemia Sister    Hypertension Maternal Grandmother    Diabetes Maternal Grandmother    Osteoporosis Maternal Grandmother    Cirrhosis  Maternal Grandfather    Thyroid disease Paternal Aunt    Breast cancer Neg Hx     Social History Social History   Tobacco Use   Smoking status: Former    Types: Cigarettes    Quit date: 1996    Years since quitting: 27.7   Smokeless tobacco: Never   Tobacco comments:    Weed  Scientific laboratory technician Use: Never used  Substance Use Topics   Alcohol use: Yes    Comment: social   Drug use: Yes    Types: Marijuana     Allergies   Codeine   Review of Systems Review of Systems   Physical Exam Triage Vital Signs ED Triage Vitals  Enc Vitals Group     BP 09/01/22 0942 127/82     Pulse Rate 09/01/22 0942 71     Resp 09/01/22 0942 18     Temp 09/01/22 0942 98.5 F (36.9 C)     Temp Source 09/01/22 0942 Oral     SpO2 09/01/22 0942 99 %     Weight --      Height --      Head Circumference --      Peak Flow --      Pain Score 09/01/22 0940 8     Pain Loc --      Pain Edu? --      Excl. in [place]? --    No data found.  Updated Vital Signs BP 127/82 (BP Location: Left Arm)   Pulse 71   Temp 98.5 F (36.9 C) (Oral)   Resp 18   LMP 06/06/2022   SpO2 99%   Visual Acuity Right Eye Distance:   Left Eye Distance:   Bilateral Distance:    Right Eye Near:   Left Eye Near:    Bilateral Near:     Physical Exam Vitals reviewed.  Constitutional:      General: She is not in acute distress.    Appearance: She is not toxic-appearing.  HENT:     Right Ear: Tympanic membrane and ear canal normal.     Left Ear: Tympanic membrane and ear canal normal.     Nose: Nose normal.     Mouth/Throat:     Mouth: Mucous membranes are moist.  Pharynx: No oropharyngeal exudate or posterior oropharyngeal erythema.  Eyes:     Extraocular Movements: Extraocular movements intact.     Conjunctiva/sclera: Conjunctivae normal.     Pupils: Pupils are equal, round, and reactive to light.  Cardiovascular:     Rate and Rhythm: Normal rate and regular rhythm.     Heart sounds: No murmur  heard. Pulmonary:     Effort: Pulmonary effort is normal. No respiratory distress.     Breath sounds: No wheezing, rhonchi or rales.  Chest:     Chest wall: No tenderness.  Abdominal:     General: There is no distension.     Palpations: Abdomen is soft.     Tenderness: There is no guarding.     Comments: There is tenderness in her suprapubic area.  Musculoskeletal:     Cervical back: Neck supple.  Lymphadenopathy:     Cervical: No cervical adenopathy.  Skin:    Capillary Refill: Capillary refill takes less than 2 seconds.     Coloration: Skin is not jaundiced or pale.  Neurological:     General: No focal deficit present.     Mental Status: She is alert and oriented to person, place, and time.  Psychiatric:        Behavior: Behavior normal.      UC Treatments / Results  Labs (all labs ordered are listed, but only abnormal results are displayed) Labs Reviewed  POCT URINALYSIS DIPSTICK, ED / UC - Abnormal; Notable for the following components:      Result Value   Hgb urine dipstick TRACE (*)    All other components within normal limits  POC URINE PREG, ED  CERVICOVAGINAL ANCILLARY ONLY    EKG   Radiology No results found.  Procedures Procedures (including critical care time)  Medications Ordered in UC Medications - No data to display  Initial Impression / Assessment and Plan / UC Course  I have reviewed the triage vital signs and the nursing notes.  Pertinent labs & imaging results that were available during my care of the patient were reviewed by me and considered in my medical decision making (see chart for details).        UPT is negative.  Urinalysis is essentially negative with a trace of hemoglobin only. I am going to go ahead and treat for acute sinusitis with the length of her congestion symptoms.  Also I will send in fluconazole empirically while she awaits the results of her swab. Final Clinical Impressions(s) / UC Diagnoses   Final diagnoses:   Acute maxillary sinusitis, recurrence not specified  Pelvic pain     Discharge Instructions      The urinalysis only had a trace of blood and did not have any white blood cells or protein  The pregnancy test was negative  Staff will notify you of any positives on your swab  Take cefdinir 300 mg--2 capsules once daily for 7 days.  This is for possible sinus infection.  Fluconazole 150 mg--this is for yeast infection--you can take 1 dose and then repeat in 3 days.  You may want to wait to start this till you are in the middle of your course of antibiotics     ED Prescriptions     Medication Sig Dispense Auth. Provider   cefdinir (OMNICEF) 300 MG capsule Take 2 capsules (600 mg total) by mouth daily for 7 days. 14 capsule Barrett Henle, MD   fluconazole (DIFLUCAN) 150 MG tablet Take 1 tablet (150  mg total) by mouth every 3 (three) days for 2 doses. 2 tablet Windy Carina Gwenlyn Perking, MD      PDMP not reviewed this encounter.   Barrett Henle, MD 09/01/22 1018

## 2022-09-01 NOTE — Discharge Instructions (Addendum)
The urinalysis only had a trace of blood and did not have any white blood cells or protein  The pregnancy test was negative  Staff will notify you of any positives on your swab  Take cefdinir 300 mg--2 capsules once daily for 7 days.  This is for possible sinus infection.  Fluconazole 150 mg--this is for yeast infection--you can take 1 dose and then repeat in 3 days.  You may want to wait to start this till you are in the middle of your course of antibiotics

## 2022-09-01 NOTE — ED Triage Notes (Signed)
Pt reports congestion, sore throat, right ear clogging, abd pains back pains, vaginal discharge. Reports had bacterial and yeast infection in June and reports symptoms have been ongoing since then.

## 2022-09-05 ENCOUNTER — Telehealth (HOSPITAL_COMMUNITY): Payer: Self-pay | Admitting: Emergency Medicine

## 2022-09-05 LAB — CERVICOVAGINAL ANCILLARY ONLY
Bacterial Vaginitis (gardnerella): POSITIVE — AB
Candida Glabrata: NEGATIVE
Candida Vaginitis: NEGATIVE
Chlamydia: NEGATIVE
Comment: NEGATIVE
Comment: NEGATIVE
Comment: NEGATIVE
Comment: NEGATIVE
Comment: NEGATIVE
Comment: NORMAL
Neisseria Gonorrhea: NEGATIVE
Trichomonas: NEGATIVE

## 2022-09-05 MED ORDER — METRONIDAZOLE 500 MG PO TABS: 500.0000 mg | ORAL_TABLET | Freq: Two times a day (BID) | ORAL | 0 refills | Status: DC

## 2022-09-06 DIAGNOSIS — Z1231 Encounter for screening mammogram for malignant neoplasm of breast: Secondary | ICD-10-CM | POA: Diagnosis not present

## 2022-09-15 ENCOUNTER — Other Ambulatory Visit: Payer: Self-pay

## 2022-09-15 ENCOUNTER — Encounter (HOSPITAL_BASED_OUTPATIENT_CLINIC_OR_DEPARTMENT_OTHER): Payer: Self-pay

## 2022-09-15 ENCOUNTER — Emergency Department (HOSPITAL_BASED_OUTPATIENT_CLINIC_OR_DEPARTMENT_OTHER)
Admission: EM | Admit: 2022-09-15 | Discharge: 2022-09-15 | Disposition: A | Discharge status: Home / Self Care | Payer: BC Managed Care – PPO | Attending: Emergency Medicine | Admitting: Emergency Medicine

## 2022-09-15 ENCOUNTER — Emergency Department (HOSPITAL_BASED_OUTPATIENT_CLINIC_OR_DEPARTMENT_OTHER): Payer: BC Managed Care – PPO

## 2022-09-15 ENCOUNTER — Encounter (HOSPITAL_COMMUNITY): Payer: Self-pay

## 2022-09-15 ENCOUNTER — Ambulatory Visit (HOSPITAL_COMMUNITY)
Admission: RE | Admit: 2022-09-15 | Discharge: 2022-09-15 | Disposition: A | Discharge status: Home / Self Care | Payer: BC Managed Care – PPO | Source: Ambulatory Visit | Attending: Internal Medicine | Admitting: Internal Medicine

## 2022-09-15 VITALS — BP 135/82 | HR 67 | Temp 98.1°F | Resp 18

## 2022-09-15 DIAGNOSIS — R1084 Generalized abdominal pain: Secondary | ICD-10-CM

## 2022-09-15 DIAGNOSIS — R10823 Right lower quadrant rebound abdominal tenderness: Secondary | ICD-10-CM | POA: Diagnosis not present

## 2022-09-15 DIAGNOSIS — R1031 Right lower quadrant pain: Secondary | ICD-10-CM | POA: Diagnosis not present

## 2022-09-15 DIAGNOSIS — D259 Leiomyoma of uterus, unspecified: Secondary | ICD-10-CM | POA: Diagnosis not present

## 2022-09-15 DIAGNOSIS — K8020 Calculus of gallbladder without cholecystitis without obstruction: Secondary | ICD-10-CM | POA: Insufficient documentation

## 2022-09-15 DIAGNOSIS — N12 Tubulo-interstitial nephritis, not specified as acute or chronic: Secondary | ICD-10-CM

## 2022-09-15 DIAGNOSIS — N200 Calculus of kidney: Secondary | ICD-10-CM | POA: Diagnosis not present

## 2022-09-15 DIAGNOSIS — R1011 Right upper quadrant pain: Secondary | ICD-10-CM | POA: Diagnosis not present

## 2022-09-15 LAB — URINALYSIS, ROUTINE W REFLEX MICROSCOPIC
Bilirubin Urine: NEGATIVE
Glucose, UA: NEGATIVE mg/dL
Hgb urine dipstick: NEGATIVE
Leukocytes,Ua: NEGATIVE
Nitrite: NEGATIVE
Protein, ur: 30 mg/dL — AB
Specific Gravity, Urine: 1.041 — ABNORMAL HIGH (ref 1.005–1.030)
pH: 6 (ref 5.0–8.0)

## 2022-09-15 LAB — COMPREHENSIVE METABOLIC PANEL
ALT: 11 U/L (ref 0–44)
AST: 13 U/L — ABNORMAL LOW (ref 15–41)
Albumin: 4.2 g/dL (ref 3.5–5.0)
Alkaline Phosphatase: 87 U/L (ref 38–126)
Anion gap: 9 (ref 5–15)
BUN: 15 mg/dL (ref 6–20)
CO2: 25 mmol/L (ref 22–32)
Calcium: 9.6 mg/dL (ref 8.9–10.3)
Chloride: 105 mmol/L (ref 98–111)
Creatinine, Ser: 1.02 mg/dL — ABNORMAL HIGH (ref 0.44–1.00)
GFR, Estimated: >60 mL/min (ref >60)
Glucose, Bld: 132 mg/dL — ABNORMAL HIGH (ref 70–99)
Potassium: 3.7 mmol/L (ref 3.5–5.1)
Sodium: 139 mmol/L (ref 135–145)
Total Bilirubin: 0.6 mg/dL (ref 0.3–1.2)
Total Protein: 7.2 g/dL (ref 6.5–8.1)

## 2022-09-15 LAB — CBC
HCT: 40.1 % (ref 36.0–46.0)
Hemoglobin: 13.1 g/dL (ref 12.0–15.0)
MCH: 28.7 pg (ref 26.0–34.0)
MCHC: 32.7 g/dL (ref 30.0–36.0)
MCV: 87.9 fL (ref 80.0–100.0)
Platelets: 286 10*3/uL (ref 150–400)
RBC: 4.56 MIL/uL (ref 3.87–5.11)
RDW: 13.2 % (ref 11.5–15.5)
WBC: 7.2 10*3/uL (ref 4.0–10.5)
nRBC: 0 % (ref 0.0–0.2)

## 2022-09-15 LAB — POCT URINALYSIS DIPSTICK, ED / UC
Glucose, UA: NEGATIVE mg/dL
Hgb urine dipstick: NEGATIVE
Ketones, ur: NEGATIVE mg/dL
Leukocytes,Ua: NEGATIVE
Nitrite: NEGATIVE
Protein, ur: 100 mg/dL — AB
Specific Gravity, Urine: >=1.03 (ref 1.005–1.030)
Urobilinogen, UA: 1 mg/dL (ref 0.0–1.0)
pH: 5.5 (ref 5.0–8.0)

## 2022-09-15 LAB — PREGNANCY, URINE: Preg Test, Ur: NEGATIVE

## 2022-09-15 LAB — LIPASE, BLOOD: Lipase: <10 U/L — ABNORMAL LOW (ref 11–51)

## 2022-09-15 MED ORDER — FENTANYL CITRATE PF 50 MCG/ML IJ SOSY
50.0000 ug | PREFILLED_SYRINGE | Freq: Once | INTRAMUSCULAR | Status: DC
  Filled 2022-09-15: qty 1

## 2022-09-15 MED ORDER — ONDANSETRON HCL 4 MG/2ML IJ SOLN
4.0000 mg | Freq: Once | INTRAMUSCULAR | Status: AC
  Administered 2022-09-15: 4 mg via INTRAVENOUS
  Filled 2022-09-15: qty 2

## 2022-09-15 MED ORDER — SODIUM CHLORIDE 0.9 % IV BOLUS
1000.0000 mL | Freq: Once | INTRAVENOUS | Status: AC
  Administered 2022-09-15: 1000 mL via INTRAVENOUS

## 2022-09-15 MED ORDER — ONDANSETRON 4 MG PO TBDP: 4.0000 mg | ORAL_TABLET | Freq: Three times a day (TID) | ORAL | 0 refills | Status: DC | PRN

## 2022-09-15 MED ORDER — IOHEXOL 300 MG/ML  SOLN
100.0000 mL | Freq: Once | INTRAMUSCULAR | Status: AC | PRN
  Administered 2022-09-15: 75 mL via INTRAVENOUS

## 2022-09-15 MED ORDER — CEPHALEXIN 500 MG PO CAPS: 500.0000 mg | ORAL_CAPSULE | Freq: Three times a day (TID) | ORAL | 0 refills | Status: DC

## 2022-09-15 MED ORDER — FENTANYL CITRATE PF 50 MCG/ML IJ SOSY
50.0000 ug | PREFILLED_SYRINGE | Freq: Once | INTRAMUSCULAR | Status: AC
  Administered 2022-09-15: 50 ug via INTRAVENOUS

## 2022-09-15 MED ORDER — FENTANYL CITRATE PF 50 MCG/ML IJ SOSY
50.0000 ug | PREFILLED_SYRINGE | Freq: Once | INTRAMUSCULAR | Status: AC
  Administered 2022-09-15: 50 ug via INTRAVENOUS
  Filled 2022-09-15: qty 1

## 2022-09-15 MED ORDER — OXYCODONE-ACETAMINOPHEN 5-325 MG PO TABS
1.0000 | ORAL_TABLET | Freq: Once | ORAL | Status: AC
  Administered 2022-09-15: 1 via ORAL
  Filled 2022-09-15: qty 1

## 2022-09-15 MED ORDER — SODIUM CHLORIDE 0.9 % IV SOLN
1.0000 g | Freq: Once | INTRAVENOUS | Status: AC
  Administered 2022-09-15: 1 g via INTRAVENOUS
  Filled 2022-09-15: qty 10

## 2022-09-15 NOTE — ED Provider Notes (Signed)
[facility] [place] EMERGENCY DEPT Provider Note   CSN: [id number] Arrival date & time: 09/15/22  1522     History  Chief Complaint  Patient presents with   Abdominal Pain    Misty Bailey is a 51 y.o. female.  Patient sent from an urgent care with right-sided lower abdominal pain for the past 6 to 7 days.  Pain is progressively worsening.  Pain waxes and wanes in severity and feels like abdominal "cramping".  Pain never goes away completely.  She had a poor appetite at home.  Taking Tylenol and naproxen without relief.  She reports severe pain to her right abdomen associated with some nausea but no vomiting.  No pain with urination but her urine has been darker than usual.  No blood in the urine.  No vaginal bleeding or discharge.  Denies any constipation or diarrhea.  Still has appendix and gallbladder, still has a uterus and ovaries. Sent from urgent care with concern for appendicitis.  The history is provided by the patient.  Abdominal Pain Associated symptoms: nausea   Associated symptoms: no cough, no dysuria, no fever, no hematuria, no shortness of breath and no vomiting        Home Medications Prior to Admission medications   Medication Sig Start Date End Date Taking? Authorizing Provider  albuterol (VENTOLIN HFA) 108 (90 Base) MCG/ACT inhaler Inhale 2 puffs into the lungs every 6 (six) hours as needed for wheezing or shortness of breath. 09/03/20   Ladell Pier, MD  amLODipine (NORVASC) 5 MG tablet Take 1 tablet (5 mg total) by mouth daily. 08/17/21   Ladell Pier, MD  atorvastatin (LIPITOR) 10 MG tablet Take 1 tablet (10 mg total) by mouth daily. 08/17/21   Ladell Pier, MD  carbamide peroxide (DEBROX) 6.5 % OTIC solution Place 5 drops into the right ear 2 (two) times daily. 06/08/22   Gildardo Pounds, NP  fluticasone (FLONASE) 50 MCG/ACT nasal spray Place 1 spray into both nostrils daily. 06/08/22   Gildardo Pounds, NP  metroNIDAZOLE (FLAGYL) 500  MG tablet Take 1 tablet (500 mg total) by mouth 2 (two) times daily. 09/05/22   Lamptey, Myrene Galas, MD  nitroGLYCERIN (NITROSTAT) 0.4 MG SL tablet Place 1 tablet (0.4 mg total) under the tongue every 5 (five) minutes as needed for chest pain. 10/01/21   Early Osmond, MD      Allergies    Codeine    Review of Systems   Review of Systems  Constitutional:  Negative for activity change, appetite change and fever.  HENT:  Negative for congestion.   Respiratory:  Negative for cough and shortness of breath.   Gastrointestinal:  Positive for abdominal pain and nausea. Negative for vomiting.  Genitourinary:  Negative for dysuria and hematuria.  Musculoskeletal:  Negative for arthralgias, back pain and myalgias.  Skin:  Negative for rash.  Neurological:  Negative for weakness and headaches.   all other systems are negative except as noted in the HPI and PMH.    Physical Exam Updated Vital Signs BP (!) 140/79 (BP Location: Right Arm)   Pulse 79   Temp 98.2 F (36.8 C)   Resp 14   Ht '5\' 2"'$  (1.575 m)   Wt 62.1 kg   LMP 06/06/2022 (Approximate)   SpO2 100%   BMI 25.06 kg/m  Physical Exam Vitals and nursing note reviewed.  Constitutional:      General: She is not in acute distress.    Appearance: She  is well-developed.  HENT:     Head: Normocephalic and atraumatic.     Mouth/Throat:     Pharynx: No oropharyngeal exudate.  Eyes:     Conjunctiva/sclera: Conjunctivae normal.     Pupils: Pupils are equal, round, and reactive to light.  Neck:     Comments: No meningismus. Cardiovascular:     Rate and Rhythm: Normal rate and regular rhythm.     Heart sounds: Normal heart sounds. No murmur heard. Pulmonary:     Effort: Pulmonary effort is normal. No respiratory distress.     Breath sounds: Normal breath sounds.  Abdominal:     Palpations: Abdomen is soft.     Tenderness: There is abdominal tenderness. There is guarding. There is no rebound.     Comments: Diffuse tenderness worse in  the right lower quadrant with guarding.  No rebound.  No CVA tenderness  Musculoskeletal:        General: No tenderness. Normal range of motion.     Cervical back: Normal range of motion and neck supple.  Skin:    General: Skin is warm.  Neurological:     Mental Status: She is alert and oriented to person, place, and time.     Cranial Nerves: No cranial nerve deficit.     Motor: No abnormal muscle tone.     Coordination: Coordination normal.     Comments:  5/5 strength throughout. CN 2-12 intact.Equal grip strength.   Psychiatric:        Behavior: Behavior normal.     ED Results / Procedures / Treatments   Labs (all labs ordered are listed, but only abnormal results are displayed) Labs Reviewed  LIPASE, BLOOD - Abnormal; Notable for the following components:      Result Value   Lipase <10 (*)    All other components within normal limits  COMPREHENSIVE METABOLIC PANEL - Abnormal; Notable for the following components:   Glucose, Bld 132 (*)    Creatinine, Ser 1.02 (*)    AST 13 (*)    All other components within normal limits  URINALYSIS, ROUTINE W REFLEX MICROSCOPIC - Abnormal; Notable for the following components:   APPearance CLOUDY (*)    Specific Gravity, Urine 1.041 (*)    Ketones, ur TRACE (*)    Protein, ur 30 (*)    All other components within normal limits  URINE CULTURE  CBC  PREGNANCY, URINE    EKG None  Radiology US Abdomen Limited RUQ (LIVER/GB)  Result Date: 09/15/2022 CLINICAL DATA:  [id number] with right upper quadrant pain and gallstones on CT. EXAM: ULTRASOUND ABDOMEN LIMITED RIGHT UPPER QUADRANT COMPARISON:  CT with IV contrast earlier today, CT with IV contrast 07/30/2017 . FINDINGS: Gallbladder: There is a mild thickening of the free wall which measures 4 mm but there is no positive sonographic Murphy's sign, and the gallbladder is partially contracted. There are multiple small layering stones filling the lumen. There is no pericholecystic fluid. Common  bile duct: Diameter: Measures prominent for age at 7.4 mm but is similar in caliber to the 2018 CT, suggesting this could be normal for the patient. There is no intrahepatic biliary prominence. Liver: No focal lesion identified. Within normal limits in parenchymal echogenicity. Portal vein is patent on color Doppler imaging with normal direction of blood flow towards the liver. Other: None. IMPRESSION: 1. Cholelithiasis, with mild gallbladder thickening versus nondistention. No positive sonographic Murphy's sign or pericholecystic fluid. 2. Prominent common bile duct for age but no more prominent  than on a 2018 CT. No intrahepatic biliary dilatation. Laboratory and clinical correlation suggested. Electronically Signed   By: Telford Nab M.D.   On: 09/15/2022 20:08   US PELVIC COMPLETE W TRANSVAGINAL AND TORSION R/O  Result Date: 09/15/2022 CLINICAL DATA:  Pelvic pain, history of uterine fibroids on recent CT EXAM: TRANSABDOMINAL AND TRANSVAGINAL ULTRASOUND OF PELVIS DOPPLER ULTRASOUND OF OVARIES TECHNIQUE: Both transabdominal and transvaginal ultrasound examinations of the pelvis were performed. Transabdominal technique was performed for global imaging of the pelvis including uterus, ovaries, adnexal regions, and pelvic cul-de-sac. It was necessary to proceed with endovaginal exam following the transabdominal exam to visualize the ovaries. Color and duplex Doppler ultrasound was utilized to evaluate blood flow to the ovaries. COMPARISON:  CT from earlier in the same day. FINDINGS: Uterus Measurements: 7.7 x 3.9 x 4.8 cm. = volume: 77 mL. Uterus is retroflexed 2 cm fibroid is noted posteriorly in the uterus similar to that seen on prior CT. Endometrium Thickness: 9 mm.  No focal abnormality visualized. Right ovary Measurements: 2.2 x 1.4 x 1.7 cm. = volume: 2.6 mL. Normal appearance/no adnexal mass. Left ovary Measurements: 3.4 x 2.0 x 1.9 cm. = volume: 6.6 mL. Normal appearance/no adnexal mass. Pulsed Doppler  evaluation of both ovaries demonstrates normal low-resistance arterial and venous waveforms. Other findings No abnormal free fluid. IMPRESSION: Uterine fibroid measuring 2 cm posteriorly. No other focal abnormality is noted. Electronically Signed   By: Inez Catalina M.D.   On: 09/15/2022 20:05   CT ABDOMEN PELVIS W CONTRAST  Result Date: 09/15/2022 CLINICAL DATA:  RLQ abdominal pain (Age >= 14y). lower abdominal pain for about 1 week. States it hurts to pee, have a bowel movement or do any movements. Had diarrhea for the first couple of days which has subsided. Denies n/v. R/O appendicitis EXAM: CT ABDOMEN AND PELVIS WITH CONTRAST TECHNIQUE: Multidetector CT imaging of the abdomen and pelvis was performed using the standard protocol following bolus administration of intravenous contrast. RADIATION DOSE REDUCTION: This exam was performed according to the departmental dose-optimization program which includes automated exposure control, adjustment of the mA and/or kV according to patient size and/or use of iterative reconstruction technique. CONTRAST:  15m OMNIPAQUE IOHEXOL 300 MG/ML  SOLN COMPARISON:  CT abdomen pelvis 07/30/2017 FINDINGS: Lower chest: No acute abnormality. Hepatobiliary: Chronic scattered subcentimeter hypodensities are too small to characterize. No focal liver abnormality. Calcified gallstones noted within the gallbladder lumen. No gallbladder wall thickening or pericholecystic fluid. No biliary dilatation. Pancreas: No focal lesion. Normal pancreatic contour. No surrounding inflammatory changes. No main pancreatic ductal dilatation. Skin Normal in size without focal abnormality. Spleen: Normal in size without focal abnormality. Adrenals/Urinary Tract: No adrenal nodule bilaterally. Striated nephrogram bilaterally.  Left renal cortical scarring. No hydronephrosis. No hydroureter. 2 mm left nephrolithiasis. No right nephrolithiasis. No ureterolithiasis bilaterally. The urinary bladder is  unremarkable. Stomach/Bowel: Stomach is within normal limits. No evidence of bowel wall thickening or dilatation. Colonic diverticulosis. Appendix appears normal. Vascular/Lymphatic: No abdominal aorta or iliac aneurysm. Moderate atherosclerotic plaque of the aorta and its branches. No abdominal, pelvic, or inguinal lymphadenopathy. Reproductive: There is a 4.4 cm mass lesion along the uterine fundus. The uterus is retroflexed. Otherwise uterus and bilateral adnexa are unremarkable. Other: No intraperitoneal free fluid. No intraperitoneal free gas. No organized fluid collection. Musculoskeletal: No abdominal wall hernia or abnormality. No suspicious lytic or blastic osseous lesions. No acute displaced fracture. Multilevel degenerative changes of the spine. IMPRESSION: 1. Bilateral pyelonephritis. No abscess formation. Correlate with urinalysis  for infection. 2. Nonobstructive 2 mm left nephrolithiasis. 3. Cholelithiasis with no CT evidence of acute cholecystitis. 4. Colonic diverticulosis with no acute diverticulitis. 5. Fundal uterine fibroid. 6.  Aortic Atherosclerosis (ICD10-I70.0). Electronically Signed   By: Iven Finn M.D.   On: 09/15/2022 18:08    Procedures Procedures    Medications Ordered in ED Medications  sodium chloride 0.9 % bolus 1,000 mL (has no administration in time range)  fentaNYL (SUBLIMAZE) injection 50 mcg (has no administration in time range)  ondansetron (ZOFRAN) injection 4 mg (has no administration in time range)    ED Course/ Medical Decision Making/ A&P                           Medical Decision Making Amount and/or Complexity of Data Reviewed Labs: ordered. Decision-making details documented in ED Course. Radiology: ordered and independent interpretation performed. Decision-making details documented in ED Course. ECG/medicine tests: ordered and independent interpretation performed. Decision-making details documented in ED Course.  Risk Prescription drug  management.  Diffuse lower abdominal pain worse on the right side sent from urgent care with concern for appendicitis.  Vital stable, no distress.  No fever.  Labs are reassuring.  No leukocytosis.  Lactate is normal.  Urinalysis with scant blood not overly impressive for infection.  CT scan is obtained to evaluate for appendicitis versus other pathology.  This shows normal appendix but does show bilateral pyelonephritis without obstructing stones.  Interestingly her urine does not appear overtly infected. She has cholelithiasis without evidence of cholecystitis  On recheck, patient is comfortable.  Pain is improved.  Abdomen is soft without peritoneal signs.  Discussed her findings of pyelonephritis on CT scan but no evidence of kidney stone.  Does have gallstones but doubt this is the source of her pain  Work-up is reassuring.  Ultrasound of her gallbladder shows cholelithiasis without evidence of cholecystitis.  She has no right upper quadrant pain.  Ultrasound of her uterus and ovaries are normal other than if uterine fibroid.  No evidence of ovarian torsion.  Patient comfortable on recheck.  She is tolerating p.o.  We will treat empirically for suspected pyelonephritis with antibiotics.  No evidence of appendicitis.  No evidence of retained stone.  Discussed antibiotics and symptom control, PCP follow-up.  Return to the ED with worsening pain, fever, vomiting, not able to eat or drink or other concerns.  Patient aware of gallstones but low suspicion they are causing her pain today.  Fibroid may be contributing to her pain as well.       Final Clinical Impression(s) / ED Diagnoses Final diagnoses:  Generalized abdominal pain  Pyelonephritis    Rx / DC Orders ED Discharge Orders     None         Jadiel Schmieder, Annie Main, MD 09/16/22 [phone]

## 2022-09-15 NOTE — ED Notes (Signed)
Patient tolerated PO Challenge well.

## 2022-09-15 NOTE — ED Notes (Signed)
RN provided AVS using Teachback Method. Patient verbalizes understanding of Discharge Instructions. Opportunity for Questioning and Answers were provided by RN. Patient Discharged from ED ambulatory to Home with Family. ? ?

## 2022-09-15 NOTE — Discharge Instructions (Signed)
Please go to the nearest emergency department for further evaluation of your lower abdominal pain due to physical exam findings in the clinic that are suggestive of possible appendicitis versus diverticulitis flare.

## 2022-09-15 NOTE — ED Provider Notes (Signed)
[facility] [facility]    CSN: [id number] Arrival date & time: 09/15/22  1242      History   Chief Complaint Chief Complaint  Patient presents with   Abdominal Pain    Pain in lower part of abdomen for last 5 days... pain rates at 9.. getting mi frequent - Entered by patient    HPI Misty Bailey is a 51 y.o. female.   Patient presents to urgent care for evaluation of bilateral lower abdominal pain that she states is difficult to describe, but agrees that it is a cramping sensation similar to a menstrual cramp. Pain has progressively worsened over the last 6 days, comes and goes randomly, and is worse with activities that increase intraabdominal pressure such as cough and sneezing. Reports chills at home, but no fever. States the pain to her lower abdomen was a 10 on a scale of 0-10 for the last couple of days, but got better with use of naproxen last night and is now an 8 on a scale of 0-10. States pain is tolerable, but very uncomfortable. Reports dysuria without urinary frequency, urgency, nausea, vomiting, dizziness, headache, URI symptoms, and shortness of breath.  Denies recent surgical procedures and history of appendectomy.  She finished cefdinir antibiotic 2 weeks ago for a sinus infection and was recently treated with MetroGel for bacterial vaginosis.  Stools became loose 1 week after finishing cefdinir antibiotic and patient experienced a couple of episodes of diarrhea earlier in the week but this has progressed to slightly more formed but still loose stools.  Last bowel movement was this morning and without blood/mucus.  Urine appears "darker than normal" without gross hematuria.  Patient reports a history of diverticulosis and is curious if her abdominal pain may be related to an acute flare of diverticulitis.  Last menstrual cycle was 3 months ago in June 2023.  She denies vaginal bleeding since then and states that she continues to have some white vaginal discharge after using  MetroGel for 5 nights.  Denies recent known exposure to STD or new sexual partners.  No other triggering or relieving factors identified at this time for patient's symptoms.   Abdominal Pain   Past Medical History:  Diagnosis Date   Anemia    Anxiety    Arthritis 02/23/2014   Rib Cage   Depression    Diverticulosis    Dysmenorrhea 05/08/2018   Essential hypertension 07/29/2019   HLD (hyperlipidemia)    Panic attacks    Suppurative hidradenitis     Patient Active Problem List   Diagnosis Date Noted   Influenza vaccine refused 01/04/2021   Inguinal hernia of left side without obstruction or gangrene 04/30/2020   Hyperlipidemia 07/30/2019   Syncope 07/29/2019   Essential hypertension 07/29/2019   Dysmenorrhea 05/08/2018   Herpes 03/21/2014   Suppurative hidradenitis    Depression     Past Surgical History:  Procedure Laterality Date   oral surgery     PELVIC LAPAROSCOPY  1998/1999 `   pelvic adhesions and pain   TOE SURGERY Left    great toe   WISDOM TOOTH EXTRACTION      OB History     Gravida  2   Para  1   Term      Preterm  1   AB  1   Living  0      SAB  1   IAB      Ectopic      Multiple  Live Births               Home Medications    Prior to Admission medications   Medication Sig Start Date End Date Taking? Authorizing Provider  albuterol (VENTOLIN HFA) 108 (90 Base) MCG/ACT inhaler Inhale 2 puffs into the lungs every 6 (six) hours as needed for wheezing or shortness of breath. 09/03/20   Ladell Pier, MD  amLODipine (NORVASC) 5 MG tablet Take 1 tablet (5 mg total) by mouth daily. 08/17/21   Ladell Pier, MD  atorvastatin (LIPITOR) 10 MG tablet Take 1 tablet (10 mg total) by mouth daily. 08/17/21   Ladell Pier, MD  carbamide peroxide (DEBROX) 6.5 % OTIC solution Place 5 drops into the right ear 2 (two) times daily. 06/08/22   Gildardo Pounds, NP  fluticasone (FLONASE) 50 MCG/ACT nasal spray Place 1 spray into  both nostrils daily. 06/08/22   Gildardo Pounds, NP  metroNIDAZOLE (FLAGYL) 500 MG tablet Take 1 tablet (500 mg total) by mouth 2 (two) times daily. 09/05/22   Lamptey, Myrene Galas, MD  nitroGLYCERIN (NITROSTAT) 0.4 MG SL tablet Place 1 tablet (0.4 mg total) under the tongue every 5 (five) minutes as needed for chest pain. 10/01/21   Early Osmond, MD    Family History Family History  Problem Relation Age of Onset   Diabetes Mother    Hypertension Mother    Hyperlipidemia Mother    Heart murmur Mother    Crohn's disease Mother    Hypertension Father    Stroke Father    Hyperlipidemia Father    Diabetes Sister    Hypertension Sister    Hyperlipidemia Sister    Hypertension Maternal Grandmother    Diabetes Maternal Grandmother    Osteoporosis Maternal Grandmother    Cirrhosis Maternal Grandfather    Thyroid disease Paternal Aunt    Breast cancer Neg Hx     Social History Social History   Tobacco Use   Smoking status: Former    Types: Cigarettes    Quit date: 1996    Years since quitting: 27.7   Smokeless tobacco: Never   Tobacco comments:    Weed  Scientific laboratory technician Use: Never used  Substance Use Topics   Alcohol use: Yes    Comment: social   Drug use: Yes    Types: Marijuana     Allergies   Codeine   Review of Systems Review of Systems  Gastrointestinal:  Positive for abdominal pain.  Per HPI   Physical Exam Triage Vital Signs ED Triage Vitals  Enc Vitals Group     BP 09/15/22 1320 135/82     Pulse Rate 09/15/22 1320 67     Resp 09/15/22 1320 18     Temp 09/15/22 1320 98.1 F (36.7 C)     Temp Source 09/15/22 1320 Oral     SpO2 09/15/22 1320 96 %     Weight --      Height --      Head Circumference --      Peak Flow --      Pain Score 09/15/22 1319 8     Pain Loc --      Pain Edu? --      Excl. in [place]? --    No data found.  Updated Vital Signs BP 135/82   Pulse 67   Temp 98.1 F (36.7 C) (Oral)   Resp 18   LMP 06/06/2022 (Approximate)  SpO2 96%   Visual Acuity Right Eye Distance:   Left Eye Distance:   Bilateral Distance:    Right Eye Near:   Left Eye Near:    Bilateral Near:     Physical Exam Vitals and nursing note reviewed.  Constitutional:      Appearance: Normal appearance. She is not ill-appearing or toxic-appearing.     Comments: Very pleasant patient sitting on exam in position of comfort table in no acute distress.   HENT:     Head: Normocephalic and atraumatic.     Right Ear: Hearing and external ear normal.     Left Ear: Hearing and external ear normal.     Nose: Nose normal.     Mouth/Throat:     Lips: Pink.     Mouth: Mucous membranes are moist.  Eyes:     General: Lids are normal. Vision grossly intact. Gaze aligned appropriately.     Extraocular Movements: Extraocular movements intact.     Conjunctiva/sclera: Conjunctivae normal.  Cardiovascular:     Rate and Rhythm: Normal rate and regular rhythm.     Heart sounds: Normal heart sounds, S1 normal and S2 normal.  Pulmonary:     Effort: Pulmonary effort is normal. No respiratory distress.     Breath sounds: Normal breath sounds and air entry.  Abdominal:     General: Abdomen is flat. Bowel sounds are decreased.     Palpations: Abdomen is soft.     Tenderness: There is abdominal tenderness in the right lower quadrant. There is guarding and rebound. There is no right CVA tenderness or left CVA tenderness. Positive signs include McBurney's sign.  Musculoskeletal:     Cervical back: Neck supple.  Skin:    General: Skin is warm and dry.     Capillary Refill: Capillary refill takes less than 2 seconds.     Findings: No rash.  Neurological:     General: No focal deficit present.     Mental Status: She is alert and oriented to person, place, and time. Mental status is at baseline.     Cranial Nerves: No dysarthria or facial asymmetry.     Gait: Gait is intact.  Psychiatric:        Mood and Affect: Mood normal.        Speech: Speech normal.         Behavior: Behavior normal.        Thought Content: Thought content normal.        Judgment: Judgment normal.      UC Treatments / Results  Labs (all labs ordered are listed, but only abnormal results are displayed) Labs Reviewed  POCT URINALYSIS DIPSTICK, ED / UC - Abnormal; Notable for the following components:      Result Value   Bilirubin Urine SMALL (*)    Protein, ur 100 (*)    All other components within normal limits    EKG   Radiology No results found.  Procedures Procedures (including critical care time)  Medications Ordered in UC Medications - No data to display  Initial Impression / Assessment and Plan / UC Course  I have reviewed the triage vital signs and the nursing notes.  Pertinent labs & imaging results that were available during my care of the patient were reviewed by me and considered in my medical decision making (see chart for details).   1.  Right lower quadrant abdominal pain tenderness with rebound tenderness Symptoms and physical exam are concerning for possible  appendicitis due to positive rebound tenderness and positive McBurney sign.  Patient is clinically well-appearing with stable vital signs despite significant abdominal tenderness.  Low suspicion for acute diverticulitis flare although this is a possibility.  Urinalysis shows elevated specific gravity consistent with dehydration and hematuria.  Considered possible kidney stone etiology of patient's symptoms as well although she does not have a personal or family history of kidney stones.  Also consider possible stool study due to recent antibiotic use with increased risk for infectious colitis as a result of this. However, due to clinical signs of possible acute abdomen, patient advised to go to the nearest emergency department for further evaluation. She is stable to go to the emergency department by personal vehicle.  Discussed risks of deferring emergency department visit at this time.   Patient discharged from urgent care in stable condition with plan to go to the nearest emergency department for further evaluation.  Final Clinical Impressions(s) / UC Diagnoses   Final diagnoses:  Right lower quadrant abdominal tenderness with rebound tenderness     Discharge Instructions      Please go to the nearest emergency department for further evaluation of your lower abdominal pain due to physical exam findings in the clinic that are suggestive of possible appendicitis versus diverticulitis flare.   ED Prescriptions   None    PDMP not reviewed this encounter.   Talbot Grumbling, [place] 09/15/22 1434

## 2022-09-15 NOTE — Discharge Instructions (Signed)
Your testing is reassuring.  Take the antibiotics for possible kidney infection.  You do have gallstones but this does not appear to be causing her pain today.  You also have a fibroid in your uterus.  Follow-up with your primary doctor as well as the general surgeon regarding your gallstones.  Return to the ED with worsening pain, fever, vomiting, not able to eat or drink or other concerns.

## 2022-09-15 NOTE — ED Triage Notes (Signed)
Patient with c/o lower abdominal pain for about 1 week. States it hurts to pee, have a bowel movement or do any movements. Had diarrhea for the first couple of days which has subsided. Denies n/v.

## 2022-09-15 NOTE — ED Triage Notes (Signed)
Pt c/o abd pain x 1 week. Pt sent over from [facility] for imaging to r/o appendicitis.

## 2022-09-15 NOTE — ED Notes (Signed)
Patient transported to [facility] 

## 2022-09-15 NOTE — ED Notes (Signed)
Patient is being discharged from the Urgent Care and sent to the Emergency Department via personal vehicle. Per Gwenette Greet NP, patient is in need of higher level of care due to severe abdominal pain. Patient is aware and verbalizes understanding of plan of care.  Vitals:   09/15/22 1320  BP: 135/82  Pulse: 67  Resp: 18  Temp: 98.1 F (36.7 C)  SpO2: 96%

## 2022-09-16 LAB — URINE CULTURE: Culture: NO GROWTH

## 2022-09-19 ENCOUNTER — Ambulatory Visit: Payer: Self-pay | Admitting: *Deleted

## 2022-09-19 NOTE — Telephone Encounter (Signed)
  Chief Complaint: needs hospital follow up- abdominal pain Symptoms: constant dull pain Frequency: over 1 week Pertinent Negatives: Patient denies back pain, diarrhea, fever, urination pain, vomiting Disposition: '[]'$ ED /'[]'$ Urgent Care (no appt availability in office) / '[]'$ Appointment(In office/virtual)/ '[]'$  [facility]/ '[]'$ Home Care/ '[]'$ Refused Recommended Disposition /'[]'$ [facility]/ '[x]'$  Follow-up with PCP Additional Notes: Patient was seen at ED and treated for infection- still taking Keflex 500 mg 3 times/day-10 day course- she reports those symptoms are improving. Patient requesting referral for gall stones- advised will send note to schedule ED follow up and see if provider will do referral from ED visit notes.

## 2022-09-19 NOTE — Telephone Encounter (Signed)
Reason for Disposition . [1] MILD-MODERATE pain AND [2] constant AND [3] present > 2 hours    ED visit 9/21- needs office follow up appointment for follow up on symptoms  Answer Assessment - Initial Assessment Questions 1. LOCATION: "Where does it hurt?"      Midline-R- center by bellybutton 2. RADIATION: "Does the pain shoot anywhere else?" (e.g., chest, back)     no 3. ONSET: "When did the pain begin?" (e.g., minutes, hours or days ago)      1 week 2 days  4. SUDDEN: "Gradual or sudden onset?"     Nagging pain 5. PATTERN "Does the pain come and go, or is it constant?"    - If it comes and goes: "How long does it last?" "Do you have pain now?"     (Note: Comes and goes means the pain is intermittent. It goes away completely between bouts.)    - If constant: "Is it getting better, staying the same, or getting worse?"      (Note: Constant means the pain never goes away completely; most serious pain is constant and gets worse.)      Constant dull aching pain 6. SEVERITY: "How bad is the pain?"  (e.g., Scale 1-10; mild, moderate, or severe)    - MILD (1-3): Doesn't interfere with normal activities, abdomen soft and not tender to touch.     - MODERATE (4-7): Interferes with normal activities or awakens from sleep, abdomen tender to touch.     - SEVERE (8-10): Excruciating pain, doubled over, unable to do any normal activities.       Moderate- using naproxen  7. RECURRENT SYMPTOM: "Have you ever had this type of stomach pain before?" If Yes, ask: "When was the last time?" and "What happened that time?"      no 8. CAUSE: "What do you think is causing the stomach pain?"     Gall stones 9. RELIEVING/AGGRAVATING FACTORS: "What makes it better or worse?" (e.g., antacids, bending or twisting motion, bowel movement)     Naproxen , trying to increase water, no fried food 10. OTHER SYMPTOMS: "Do you have any other symptoms?" (e.g., back pain, diarrhea, fever, urination pain, vomiting)       At times  feels feverish, no appetite  11. PREGNANCY: "Is there any chance you are pregnant?" "When was your last menstrual period?"  Protocols used: Abdominal Pain - [facility]

## 2022-09-21 ENCOUNTER — Encounter: Payer: Self-pay | Admitting: Internal Medicine

## 2022-09-21 NOTE — Progress Notes (Signed)
See if mammogram report today from [place].  Mammogram done 09/06/2022.  Exam was negative and read as BI-RADS Category 1.

## 2022-10-11 ENCOUNTER — Encounter: Payer: Self-pay | Admitting: Internal Medicine

## 2022-10-24 ENCOUNTER — Ambulatory Visit: Payer: BC Managed Care – PPO | Attending: Internal Medicine | Admitting: Internal Medicine

## 2022-10-24 ENCOUNTER — Other Ambulatory Visit (HOSPITAL_COMMUNITY)
Admission: RE | Admit: 2022-10-24 | Discharge: 2022-10-24 | Disposition: A | Discharge status: Home / Self Care | Payer: BC Managed Care – PPO | Source: Ambulatory Visit | Attending: Internal Medicine | Admitting: Internal Medicine

## 2022-10-24 ENCOUNTER — Encounter: Payer: Self-pay | Admitting: Internal Medicine

## 2022-10-24 VITALS — BP 142/90 | HR 59 | Ht 61.0 in | Wt 147.4 lb

## 2022-10-24 DIAGNOSIS — K8020 Calculus of gallbladder without cholecystitis without obstruction: Secondary | ICD-10-CM

## 2022-10-24 DIAGNOSIS — I10 Essential (primary) hypertension: Secondary | ICD-10-CM | POA: Diagnosis not present

## 2022-10-24 DIAGNOSIS — Z124 Encounter for screening for malignant neoplasm of cervix: Secondary | ICD-10-CM

## 2022-10-24 DIAGNOSIS — N898 Other specified noninflammatory disorders of vagina: Secondary | ICD-10-CM | POA: Diagnosis not present

## 2022-10-24 DIAGNOSIS — Z0000 Encounter for general adult medical examination without abnormal findings: Secondary | ICD-10-CM

## 2022-10-24 DIAGNOSIS — K4090 Unilateral inguinal hernia, without obstruction or gangrene, not specified as recurrent: Secondary | ICD-10-CM

## 2022-10-24 DIAGNOSIS — Z0001 Encounter for general adult medical examination with abnormal findings: Secondary | ICD-10-CM

## 2022-10-24 DIAGNOSIS — R1011 Right upper quadrant pain: Secondary | ICD-10-CM | POA: Diagnosis not present

## 2022-10-24 DIAGNOSIS — Z23 Encounter for immunization: Secondary | ICD-10-CM

## 2022-10-24 DIAGNOSIS — D259 Leiomyoma of uterus, unspecified: Secondary | ICD-10-CM

## 2022-10-24 DIAGNOSIS — E782 Mixed hyperlipidemia: Secondary | ICD-10-CM

## 2022-10-24 LAB — POCT URINALYSIS DIP (CLINITEK)
Bilirubin, UA: NEGATIVE
Blood, UA: NEGATIVE
Glucose, UA: NEGATIVE mg/dL
Ketones, POC UA: NEGATIVE mg/dL
Leukocytes, UA: NEGATIVE
Nitrite, UA: NEGATIVE
Spec Grav, UA: 1.025 (ref 1.010–1.025)
Urobilinogen, UA: 0.2 E.U./dL
pH, UA: 7 (ref 5.0–8.0)

## 2022-10-24 MED ORDER — AMLODIPINE BESYLATE 5 MG PO TABS
5.0000 mg | ORAL_TABLET | Freq: Every day | ORAL | 4 refills | Status: DC
  Filled 2022-10-24: qty 30, 30d supply, fill #0

## 2022-10-24 MED ORDER — ATORVASTATIN CALCIUM 10 MG PO TABS
10.0000 mg | ORAL_TABLET | Freq: Every day | ORAL | 4 refills | Status: DC
  Filled 2022-10-24 – 2023-06-16 (×2): qty 30, 30d supply, fill #0

## 2022-10-24 NOTE — Progress Notes (Unsigned)
Patient ID: Misty Bailey, female    DOB: 03/17/1971  MRN: [id number]  CC: Annual Exam and Gynecologic Exam   Subjective: Misty Bailey is a 51 y.o. female who presents for annual exam Her concerns today include:  Dysmenorrhea, diverticulosis, HL, HTN, hidradenitis, bronchospasm.   HM:  due for flu shot.  Declines shingles. [place] 04/09/21 and [employer] 04/24/2022  GYN History:  Pt is G2P0 (1 still born, 1 miscarriage) Any hx of abn paps?: no Menses regular or irregular?:  none since 2nd wk June.  Not concerned about pregnancy as her significant other has had a vasectomy How long does menses last?  Menstrual flow light or heavy?:  Method of birth control?:  no Any vaginal dischg at this time?:  little vaginal itching Dysuria?:  no. Seen ER 08/2022 for abd pain Any hx of STI?:  yes, treated Sexually active with how many partners: 1 Desires STI screen:  yes Last MMG:  08/2022 Family hx of uterine, cervical or breast cancer?:  no  Seen in the emergency room 09/15/2022 with right lower quadrant abdominal pain.  UA was cloudy in appearance but negative for nitrites and leukocytes, urine pregnancy negative.  CBC normal, kidney and liver function good.  CT of abdomen revealed findings consistent with bilateral pyelonephritis, fibroid in the uterus, and aortic atherosclerosis. Gallbladder ultrasound revealed: IMPRESSION: 1. Cholelithiasis, with mild gallbladder thickening versus nondistention. No positive sonographic Murphy's sign or pericholecystic fluid. 2. Prominent common bile duct for age but no more prominent than on a 2018 CT. No intrahepatic biliary dilatation. Laboratory and clinical correlation suggested.  She was discharged home on antibiotics for pyelonephritis.  Still having some discomfort on the right side of her abdomen more so now in the right upper quadrant.  She denies any nausea or vomiting.  Endorses pain in the right upper quadrant  intermittently with meals.   Out of Norvasc and Atorvastatin in 1 mth because she was on too many meds from ER Has not checked BP in 1 mth.  Limits salt in foods, not much fried foods  Patient Active Problem List   Diagnosis Date Noted   Influenza vaccine refused 01/04/2021   Inguinal hernia of left side without obstruction or gangrene 04/30/2020   Hyperlipidemia 07/30/2019   Syncope 07/29/2019   Essential hypertension 07/29/2019   Dysmenorrhea 05/08/2018   Herpes 03/21/2014   Suppurative hidradenitis    Depression      Current Outpatient Medications on File Prior to Visit  Medication Sig Dispense Refill   albuterol (VENTOLIN HFA) 108 (90 Base) MCG/ACT inhaler Inhale 2 puffs into the lungs every 6 (six) hours as needed for wheezing or shortness of breath. 8 g 4   amLODipine (NORVASC) 5 MG tablet Take 1 tablet (5 mg total) by mouth daily. 30 tablet 4   atorvastatin (LIPITOR) 10 MG tablet Take 1 tablet (10 mg total) by mouth daily. 30 tablet 4   carbamide peroxide (DEBROX) 6.5 % OTIC solution Place 5 drops into the right ear 2 (two) times daily. 15 mL 0   fluticasone (FLONASE) 50 MCG/ACT nasal spray Place 1 spray into both nostrils daily. 16 g 1   nitroGLYCERIN (NITROSTAT) 0.4 MG SL tablet Place 1 tablet (0.4 mg total) under the tongue every 5 (five) minutes as needed for chest pain. (Patient not taking: Reported on 10/24/2022) 30 tablet 0   No current facility-administered medications on file prior to visit.    Allergies  Allergen Reactions   Codeine  Shortness Of Breath    Social History   Socioeconomic History   Marital status: Single    Spouse name: Not on file   Number of children: 0   Years of education: Associates   Highest education level: Not on file  Occupational History   Not on file  Tobacco Use   Smoking status: Former    Types: Cigarettes    Quit date: 1996    Years since quitting: 27.8   Smokeless tobacco: Never   Tobacco comments:    Weed  Brewing technologist Use: Never used  Substance and Sexual Activity   Alcohol use: Yes    Comment: social   Drug use: Yes    Types: Marijuana   Sexual activity: Not Currently    Partners: Male    Birth control/protection: None  Other Topics Concern   Not on file  Social History Narrative   Boyfriend incarcerated 2014-2018   Social Determinants of Health   Financial Resource Strain: Not on file  Food Insecurity: Not on file  Transportation Needs: Not on file  Physical Activity: Not on file  Stress: Not on file  Social Connections: Not on file  Intimate Partner Violence: Not on file    Family History  Problem Relation Age of Onset   Diabetes Mother    Hypertension Mother    Hyperlipidemia Mother    Heart murmur Mother    Crohn's disease Mother    Hypertension Father    Stroke Father    Hyperlipidemia Father    Diabetes Sister    Hypertension Sister    Hyperlipidemia Sister    Hypertension Maternal Grandmother    Diabetes Maternal Grandmother    Osteoporosis Maternal Grandmother    Cirrhosis Maternal Grandfather    Thyroid disease Paternal Aunt    Breast cancer Neg Hx     Past Surgical History:  Procedure Laterality Date   oral surgery     PELVIC LAPAROSCOPY  1998/1999 `   pelvic adhesions and pain   TOE SURGERY Left    great toe   WISDOM TOOTH EXTRACTION      ROS: Review of Systems Negative except as stated above  PHYSICAL EXAM: BP (!) 166/82 (BP Location: Right Arm, Patient Position: Sitting, Cuff Size: Normal)   Pulse (!) 59   Ht '5\' 1"'$  (1.549 m)   Wt 147 lb 6.4 oz (66.9 kg)   SpO2 100%   BMI 27.85 kg/m   Physical Exam  General appearance - alert, well appearing, middle-age African-American female and in no distress Mental status - normal mood, behavior, speech, dress, motor activity, and thought processes Eyes - pupils equal and reactive, extraocular eye movements intact Mouth - mucous membranes moist, pharynx normal without lesions Neck - supple, no  significant adenopathy Lymphatics - no palpable lymphadenopathy, no hepatosplenomegaly Chest - clear to auscultation, no wheezes, rales or rhonchi, symmetric air entry Heart - normal rate, regular rhythm, normal S1, S2, no murmurs, rubs, clicks or gallops Abdomen -normal bowel sounds, nondistended, soft, mild right upper quadrant tenderness without guarding or rebound. Pelvic - CMA Carly Mack Present:  normal external genitalia, vulva, vagina, cervix, uterus and adnexa Extremities - peripheral pulses normal, no pedal edema, no clubbing or cyanosis  Results for orders placed or performed in visit on 10/24/22  POCT URINALYSIS DIP (CLINITEK)  Result Value Ref Range   Color, UA yellow yellow   Clarity, UA cloudy (A) clear   Glucose, UA negative negative mg/dL  Bilirubin, UA negative negative   Ketones, POC UA negative negative mg/dL   Spec Grav, UA 1.025 1.010 - 1.025   Blood, UA negative negative   pH, UA 7.0 5.0 - 8.0   POC PROTEIN,UA trace negative, trace   Urobilinogen, UA 0.2 0.2 or 1.0 E.U./dL   Nitrite, UA Negative Negative   Leukocytes, UA Negative Negative       Latest Ref Rng & Units 09/15/2022    3:54 PM 06/20/2022    8:35 AM 10/01/2021   11:00 AM  CMP  Glucose 70 - 99 mg/dL 132  103  98   BUN 6 - 20 mg/dL '15  16  18   '$ Creatinine 0.44 - 1.00 mg/dL 1.02  1.13  1.16   Sodium 135 - 145 mmol/L 139  140  142   Potassium 3.5 - 5.1 mmol/L 3.7  4.5  4.2   Chloride 98 - 111 mmol/L 105  105  107   CO2 22 - 32 mmol/L '25  22  21   '$ Calcium 8.9 - 10.3 mg/dL 9.6  9.6  9.7   Total Protein 6.5 - 8.1 g/dL 7.2  6.7    Total Bilirubin 0.3 - 1.2 mg/dL 0.6  0.5    Alkaline Phos 38 - 126 U/L 87  91    AST 15 - 41 U/L 13  16    ALT 0 - 44 U/L 11  12     Lipid Panel     Component Value Date/Time   CHOL 213 (H) 02/03/2021 1005   TRIG 78 02/03/2021 1005   HDL 56 02/03/2021 1005   CHOLHDL 3.8 02/03/2021 1005   LDLCALC 143 (H) 02/03/2021 1005    CBC    Component Value Date/Time    WBC 7.2 09/15/2022 1554   RBC 4.56 09/15/2022 1554   HGB 13.1 09/15/2022 1554   HGB 13.6 08/17/2021 1037   HCT 40.1 09/15/2022 1554   HCT 42.3 08/17/2021 1037   PLT 286 09/15/2022 1554   PLT 312 08/17/2021 1037   MCV 87.9 09/15/2022 1554   MCV 89 08/17/2021 1037   MCH 28.7 09/15/2022 1554   MCHC 32.7 09/15/2022 1554   RDW 13.2 09/15/2022 1554   RDW 12.6 08/17/2021 1037   LYMPHSABS 2.7 01/25/2018 1106   MONOABS 0.5 08/09/2013 1045   EOSABS 0.2 01/25/2018 1106   BASOSABS 0.0 01/25/2018 1106    ASSESSMENT AND PLAN: 1. Annual physical exam   2. Pap smear for cervical cancer screening  - Cervicovaginal ancillary only - Cytology - PAP  3. Essential hypertension Not at goal.  Advised to restart amlodipine  4. Right upper quadrant abdominal pain Most likely biliary colic.  We will check liver function test.  Advised to be seen in the emergency room if she has pain that becomes persistent.  Will refer to general surgery. - Hepatic Function Panel  5. Itching of vagina Vaginal culture and smear sent to check for yeast. - POCT URINALYSIS DIP (CLINITEK)  6. Gall stones See #4 above - Ambulatory referral to General Surgery  7. Uterine leiomyoma, unspecified location Doubt this is causing or contributing to the pain that she is having.  Explained to her what a fibroid is.  Given that she has not had a menstrual cycle in a few months, she is likely perimenopausal.  Fibroids likely to shrink as she goes through menopause. - Ambulatory referral to Gynecology  8. Need for influenza vaccination Given today.   Addendum: 10/27/2022 -patient sent a  MyChart message requesting that she also be referred to the general surgeon for left inguinal hernia that was noted and discussed in 2021.  Referred to surgeon at that time but did not go. Patient was given the opportunity to ask questions.  Patient verbalized understanding of the plan and was able to repeat key elements of the plan.   This  documentation was completed using Radio producer.  Any transcriptional errors are unintentional.  Orders Placed This Encounter  Procedures   Hepatic Function Panel   Ambulatory referral to General Surgery   Ambulatory referral to Gynecology   POCT URINALYSIS DIP (CLINITEK)     Requested Prescriptions    No prescriptions requested or ordered in this encounter    Return in about 4 months (around 02/23/2023).  Karle Plumber, MD, FACP

## 2022-10-25 ENCOUNTER — Other Ambulatory Visit: Payer: Self-pay

## 2022-10-25 ENCOUNTER — Ambulatory Visit: Payer: Self-pay

## 2022-10-25 LAB — HEPATIC FUNCTION PANEL
ALT: 52 IU/L — ABNORMAL HIGH (ref 0–32)
AST: 38 IU/L (ref 0–40)
Albumin: 4.3 g/dL (ref 3.8–4.9)
Alkaline Phosphatase: 97 IU/L (ref 44–121)
Bilirubin Total: 0.3 mg/dL (ref 0.0–1.2)
Bilirubin, Direct: <0.1 mg/dL (ref 0.00–0.40)
Total Protein: 6.7 g/dL (ref 6.0–8.5)

## 2022-10-25 NOTE — Telephone Encounter (Signed)
Summary: Rx for pain/pain on her right abdominal side and back due to gallstones.   Pt stated she was seen by PCP yesterday, and she wanted to ask for some pain medication, but Dr. did not come back into the room. Pt stated she is having pain on her right abdominal side and back due to gallstones.   Pt was referred to general surgery as well.   Pt seeking clinical advice.       Called pt. Pt is not there at this time. PT is at the dentist, bu ris expected home after treatment. Mom will give message that we called and she will call back.

## 2022-10-25 NOTE — Telephone Encounter (Signed)
Pt is requesting pain medication. 

## 2022-10-25 NOTE — Telephone Encounter (Signed)
Summary: Rx for pain/pain on her right abdominal side and back due to gallstones.   Pt stated she was seen by PCP yesterday, and she wanted to ask for some pain medication, but Dr. did not come back into the room. Pt stated she is having pain on her right abdominal side and back due to gallstones.   Pt was referred to general surgery as well.   Pt seeking clinical advice.      Called patient and patient's mother answered. Patient not at home, unable to triage. Per agent report patient requesting pain medication.   Chief Complaint: requesting pain medication . Last seen in [place] yesterday  Symptoms: right abdominal pain , back pain from "gallstones" per agent note. No contact with patient per NT.  Frequency: na Pertinent Negatives: Patient denies na Disposition: '[]'$ ED /'[]'$ Urgent Care (no appt availability in office) / '[]'$ Appointment(In office/virtual)/ '[]'$  [facility]/ '[]'$ Home Care/ '[]'$ Refused Recommended Disposition /'[]'$  Mobile Bus/ '[x]'$  Follow-up with PCP Additional Notes:   Last seen in [place] yesterday and forgot to ask for pain medication. Patient not available to triage at time of call. Reviewed with patient 's mother to have patient  call back and or go to UC /ED if pain more severe.      Reason for Disposition  Prescription request for new medicine (not a refill)  Answer Assessment - Initial Assessment Questions 1. DRUG NAME: "What medicine do you need to have refilled?"     Patient's mother answered phone and patient not available at another appt. Patient is requesting medication for pain 2. REFILLS REMAINING: "How many refills are remaining?" (Note: The label on the medicine or pill bottle will show how many refills are remaining. If there are no refills remaining, then a renewal may be needed.)     na 3. EXPIRATION DATE: "What is the expiration date?" (Note: The label states when the prescription will expire, and thus can no longer be refilled.)     na 4. PRESCRIBING  HCP: "Who prescribed it?" Reason: If prescribed by specialist, call should be referred to that group.     na 5. SYMPTOMS: "Do you have any symptoms?"     Per agent report patient c/o right abdominal pain and back pain from gallstones. See in [place] with PCP yesterday no triage done with patient  6. PREGNANCY: "Is there any chance that you are pregnant?" "When was your last menstrual period?"     na  Protocols used: Medication Refill and Renewal Call-A-AH

## 2022-10-26 ENCOUNTER — Other Ambulatory Visit: Payer: Self-pay | Admitting: Internal Medicine

## 2022-10-26 ENCOUNTER — Encounter: Payer: Self-pay | Admitting: Internal Medicine

## 2022-10-26 ENCOUNTER — Other Ambulatory Visit: Payer: Self-pay

## 2022-10-26 LAB — CERVICOVAGINAL ANCILLARY ONLY
Bacterial Vaginitis (gardnerella): POSITIVE — AB
Candida Glabrata: NEGATIVE
Candida Vaginitis: NEGATIVE
Chlamydia: NEGATIVE
Comment: NEGATIVE
Comment: NEGATIVE
Comment: NEGATIVE
Comment: NEGATIVE
Comment: NEGATIVE
Comment: NORMAL
Neisseria Gonorrhea: NEGATIVE
Trichomonas: NEGATIVE

## 2022-10-26 MED ORDER — METRONIDAZOLE 500 MG PO TABS
500.0000 mg | ORAL_TABLET | Freq: Two times a day (BID) | ORAL | 0 refills | Status: DC
  Filled 2022-10-26: qty 14, 7d supply, fill #0

## 2022-10-26 NOTE — Telephone Encounter (Signed)
PC placed to pt this a.m regarding her request for pain medication.  Called x 2 using Jabber.  Got recording that call can not be completed at this time.  Then called using my cell phone with the number blocked.  I got voicemail.  I left a message informing her of who I am and that I was calling regarding her request for pain medication.  Request that she gives a call back to the office. If patient calls back, let her know that I see on medication allergies that she is allergic to codeine which causes shortness of breath.  Several of the other noncardiac medications have cross interaction with codeine.  So before prescribing, I wanted to know whether she has had tramadol in the past and tolerated without difficulty.

## 2022-10-26 NOTE — Telephone Encounter (Signed)
Call placed to pt and message was left with her mother informing her to call office.

## 2022-10-27 NOTE — Addendum Note (Signed)
Addended by: Karle Plumber B on: 10/27/2022 08:18 AM   Modules accepted: Orders

## 2022-10-28 LAB — CYTOLOGY - PAP
Comment: NEGATIVE
Comment: NEGATIVE
Comment: NEGATIVE
HPV 16: NEGATIVE
HPV 18 / 45: NEGATIVE
High risk HPV: POSITIVE — AB

## 2022-10-29 ENCOUNTER — Other Ambulatory Visit: Payer: Self-pay | Admitting: Internal Medicine

## 2022-10-29 MED ORDER — TRAMADOL HCL 50 MG PO TABS: 50.0000 mg | ORAL_TABLET | Freq: Two times a day (BID) | ORAL | 0 refills | Status: DC | PRN

## 2022-10-29 MED ORDER — METRONIDAZOLE 0.75 % EX GEL: 1.0000 | Freq: Two times a day (BID) | CUTANEOUS | 0 refills | Status: DC

## 2022-11-01 ENCOUNTER — Other Ambulatory Visit: Payer: Self-pay | Admitting: Internal Medicine

## 2022-11-01 MED ORDER — METRONIDAZOLE 0.75 % VA GEL: 1.0000 | Freq: Two times a day (BID) | VAGINAL | 0 refills | Status: DC

## 2022-11-02 ENCOUNTER — Telehealth: Payer: Self-pay | Admitting: Internal Medicine

## 2022-11-02 ENCOUNTER — Other Ambulatory Visit: Payer: Self-pay

## 2022-11-02 NOTE — Telephone Encounter (Signed)
PC placed to pt today as a follow up from her Mychart message to me yesterday about Metrogel.  LVMM informing her of who I am and reason for calling.   I then contacted [facility] on [address] and was able to confirm that pt was able to return the topical Metrogel yesterday and was issued the vaginal Metrogel instead.

## 2022-11-09 ENCOUNTER — Encounter: Payer: Self-pay | Admitting: Internal Medicine

## 2022-11-25 ENCOUNTER — Other Ambulatory Visit: Payer: Self-pay | Admitting: Surgery

## 2022-11-25 DIAGNOSIS — K8020 Calculus of gallbladder without cholecystitis without obstruction: Secondary | ICD-10-CM | POA: Diagnosis not present

## 2022-11-25 DIAGNOSIS — K4090 Unilateral inguinal hernia, without obstruction or gangrene, not specified as recurrent: Secondary | ICD-10-CM | POA: Diagnosis not present

## 2022-11-29 DIAGNOSIS — L259 Unspecified contact dermatitis, unspecified cause: Secondary | ICD-10-CM | POA: Diagnosis not present

## 2022-12-21 ENCOUNTER — Encounter (HOSPITAL_BASED_OUTPATIENT_CLINIC_OR_DEPARTMENT_OTHER): Payer: BC Managed Care – PPO | Admitting: Internal Medicine

## 2022-12-21 DIAGNOSIS — K047 Periapical abscess without sinus: Secondary | ICD-10-CM | POA: Diagnosis not present

## 2022-12-21 MED ORDER — AMOXICILLIN 500 MG PO TABS: 500.0000 mg | ORAL_TABLET | Freq: Three times a day (TID) | ORAL | 0 refills | Status: DC

## 2022-12-21 NOTE — Telephone Encounter (Signed)

## 2023-02-15 IMAGING — CT CT HEART MORP W/ CTA COR W/ SCORE W/ CA W/CM &/OR W/O CM
4 of 7 series · 8 of 20 positions shown, 9 images · IV contrast (APPLIED)
Comparison: None.
COMPARISON: None.

Addendum:
EXAM:
OVER-READ INTERPRETATION  CT CHEST

The following report is an over-read performed by radiologist Dr.
Jullon Lienad [[email or web address]] on 10/25/2021. This
over-read does not include interpretation of cardiac or coronary
anatomy or pathology. The coronary calcium score/coronary CTA
interpretation by the cardiologist is attached.
CLINICAL DATA: Chest pain
Cardiac CTA
MEDICATIONS:
Sub lingual nitro. 4mg x 2
TECHNIQUE: The patient was scanned on a Siemens [REDACTED]ice scanner. Gantry
rotation speed was 250 msecs. Collimation was 0.6 mm. A 100 kV
prospective scan was triggered in the ascending thoracic aorta at
35-75% of the R-R interval. Average HR during the scan was bpm. The
3D data set was interpreted on a dedicated work station using MPR,
MIP and VRT modes. A total of 80cc of contrast was used.

[Series 6: ts diast sharp · axial · 0.39mm/px · z∈[+1080,+1119]mm · 2 of 291 slices shown]
[im 97/291  lung]
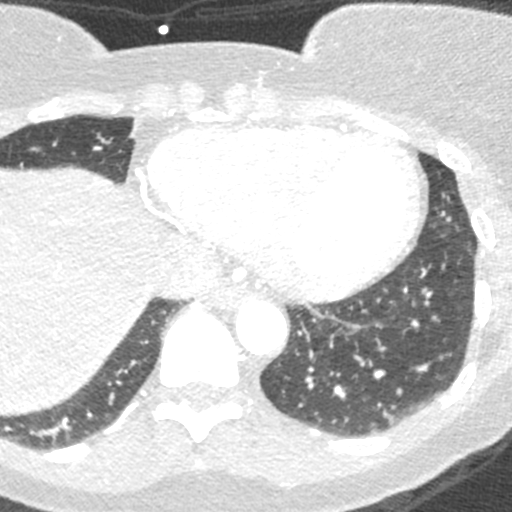
[im 194/291  lung]
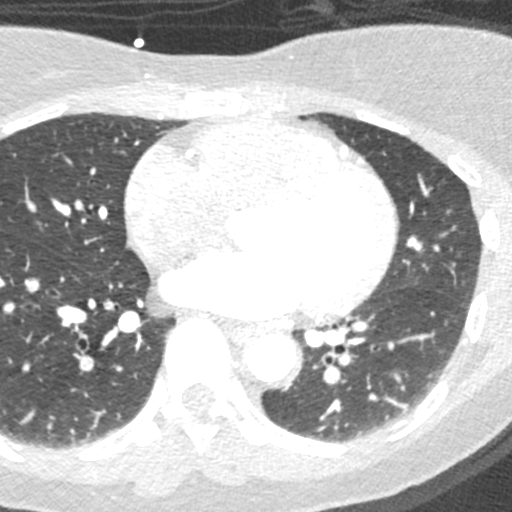

[Series 7: best diast · axial · 0.39mm/px · z∈[+1080,+1119]mm · 2 of 291 slices shown, 3 images]
[im 97/291  vessel]
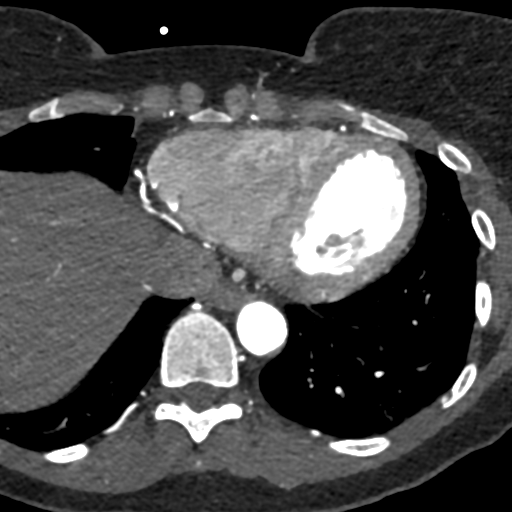
[im 97/291  lung]
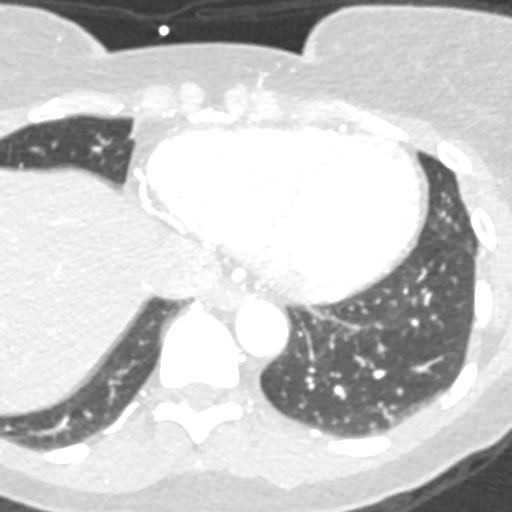
[im 194/291  vessel]
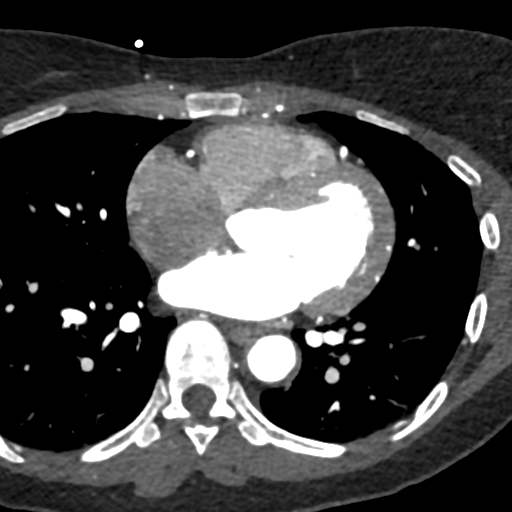

[Series 8: best syst · axial · 0.39mm/px · z∈[+1080,+1119]mm · 2 of 291 slices shown]
[im 97/291  vessel]
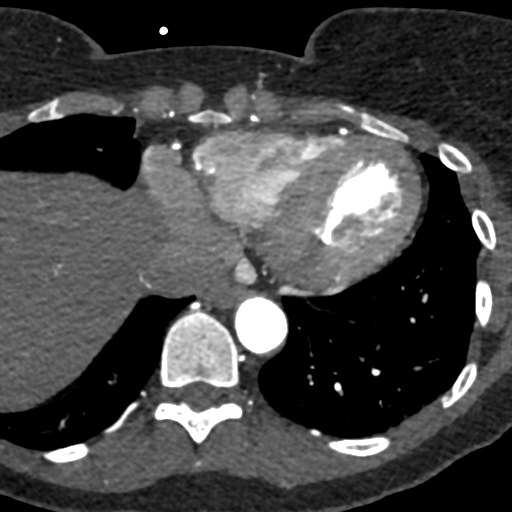
[im 194/291  vessel]
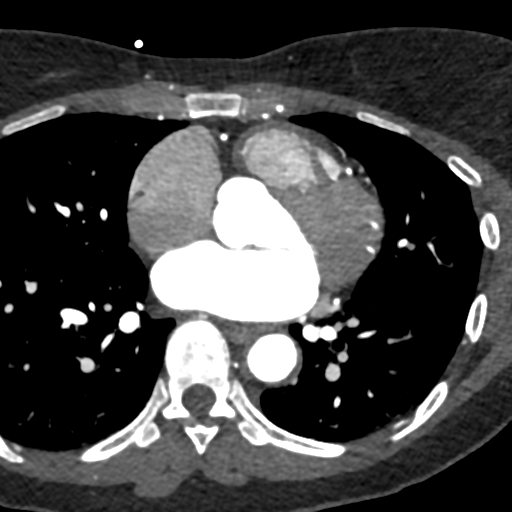

[Series 9: ts syst sharp · axial · 0.39mm/px · z∈[+1080,+1119]mm · 2 of 291 slices shown]
[im 97/291  lung]
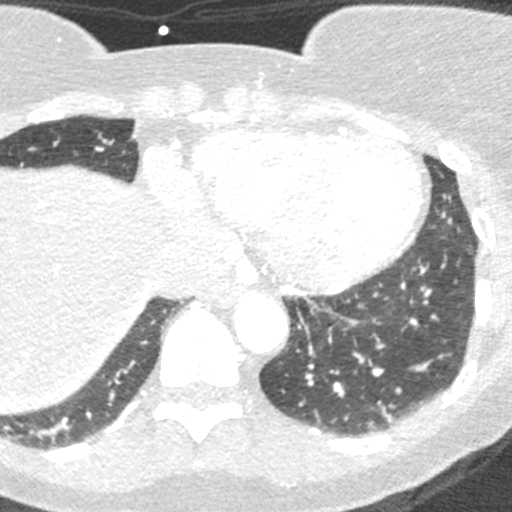
[im 194/291  lung]
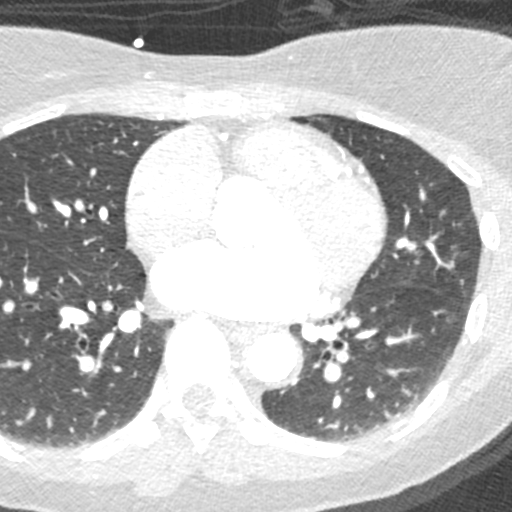

[8 of 20 positions shown; findings below may reference images not displayed]

FINDINGS: Within the visualized portions of the thorax there are no suspicious
appearing pulmonary nodules or masses, there is no acute
consolidative airspace disease, no pleural effusions, no
pneumothorax and no lymphadenopathy. Visualized portions of the
upper abdomen are unremarkable. There are no aggressive appearing
lytic or blastic lesions noted in the visualized portions of the
skeleton.
IMPRESSION: No significant incidental noncardiac findings are noted.
FINDINGS: Non-cardiac: See separate report from [REDACTED].

No LA appendage thrombus. Pulmonary veins drain normally to the left
atrium.

Calcium Score: 0 Agatston units.

Coronary Arteries: Right dominant with no anomalies

LM: No plaque or stenosis.

LAD system:  No plaque or stenosis.

Circumflex system: No plaque or stenosis.

RCA system: No plaque or stenosis.
IMPRESSION: 1. Coronary artery calcium score 0 Agatston units, suggesting low
risk for future cardiac events.

2.  No significant coronary disease.

Merim Dipak

*** End of Addendum ***
EXAM:
OVER-READ INTERPRETATION  CT CHEST

The following report is an over-read performed by radiologist Dr.
Jullon Lienad [REDACTED] on 10/25/2021. This
over-read does not include interpretation of cardiac or coronary
anatomy or pathology. The coronary calcium score/coronary CTA
interpretation by the cardiologist is attached.
FINDINGS: Within the visualized portions of the thorax there are no suspicious
appearing pulmonary nodules or masses, there is no acute
consolidative airspace disease, no pleural effusions, no
pneumothorax and no lymphadenopathy. Visualized portions of the
upper abdomen are unremarkable. There are no aggressive appearing
lytic or blastic lesions noted in the visualized portions of the
skeleton.
IMPRESSION: No significant incidental noncardiac findings are noted.

## 2023-02-23 ENCOUNTER — Encounter: Payer: Self-pay | Admitting: Internal Medicine

## 2023-02-23 ENCOUNTER — Ambulatory Visit: Payer: No Typology Code available for payment source | Attending: Internal Medicine | Admitting: Internal Medicine

## 2023-02-23 VITALS — BP 129/80 | HR 72 | Temp 98.7°F | Ht 61.0 in | Wt 145.0 lb

## 2023-02-23 DIAGNOSIS — R200 Anesthesia of skin: Secondary | ICD-10-CM | POA: Diagnosis not present

## 2023-02-23 DIAGNOSIS — I10 Essential (primary) hypertension: Secondary | ICD-10-CM

## 2023-02-23 DIAGNOSIS — K8020 Calculus of gallbladder without cholecystitis without obstruction: Secondary | ICD-10-CM

## 2023-02-23 DIAGNOSIS — E782 Mixed hyperlipidemia: Secondary | ICD-10-CM

## 2023-02-23 DIAGNOSIS — F320 Major depressive disorder, single episode, mild: Secondary | ICD-10-CM | POA: Diagnosis not present

## 2023-02-23 DIAGNOSIS — K4090 Unilateral inguinal hernia, without obstruction or gangrene, not specified as recurrent: Secondary | ICD-10-CM

## 2023-02-23 DIAGNOSIS — R202 Paresthesia of skin: Secondary | ICD-10-CM

## 2023-02-23 MED ORDER — AMLODIPINE BESYLATE 10 MG PO TABS: 10.0000 mg | ORAL_TABLET | Freq: Every day | ORAL | 6 refills | Status: DC

## 2023-02-23 MED ORDER — GABAPENTIN 600 MG PO TABS: 300.0000 mg | ORAL_TABLET | Freq: Every day | ORAL | 0 refills | Status: DC

## 2023-02-23 NOTE — Progress Notes (Signed)
Patient ID: Misty Bailey, female    DOB: 1971-09-07  MRN: [id number]  CC: Hypertension (HTN f/u. /Numbness on R arm X1 day/Pt brought in BP machine - please check/Already received flu vax. )   Subjective: Misty Bailey is a 52 y.o. female who presents for chronic ds management Her concerns today include:  Dysmenorrhea, uterine fibroid, gall stones, diverticulosis, HL, HTN, hidradenitis, bronchospasm.    HTN: Reports compliance with amlodipine 5 mg and low-salt diet. Checks BP every morning before taking her Norvasc. Range has been 150s/90.  Has wrist device. Has device with her today C/o numbness RT arm since this a.m.  Entire arm feels like it is asleep and little tingling in the hand.  She has been shaking the arm trying to wake it up. No numbness in face, slurred speech or neck pain.    HL: Taking and tolerating atorvastatin 10 mg daily  Saw surgeon at [facility] and plan was for cholecystectomy and inguinal hernia repair.  However they have played phone tag's trying to catch up to set a date.  She plans to stop at their office today when she leaves here.  Dep:  PHQ9 positive but lower than last 2 scores.  "I consider myself a functional depress person.  I always felt like that." Feels like a failure as a woman.  Had 1 miscarrage and a still born; relationship not going as well as she would like.   Now that she has insurance, she plans to get a Social worker.    Patient Active Problem List   Diagnosis Date Noted   Influenza vaccine refused 01/04/2021   Inguinal hernia of left side without obstruction or gangrene 04/30/2020   Hyperlipidemia 07/30/2019   Syncope 07/29/2019   Essential hypertension 07/29/2019   Dysmenorrhea 05/08/2018   Herpes 03/21/2014   Suppurative hidradenitis    Depression      Current Outpatient Medications on File Prior to Visit  Medication Sig Dispense Refill   albuterol (VENTOLIN HFA) 108 (90 Base) MCG/ACT inhaler Inhale 2 puffs  into the lungs every 6 (six) hours as needed for wheezing or shortness of breath. 8 g 4   amLODipine (NORVASC) 5 MG tablet Take 1 tablet (5 mg total) by mouth daily. 30 tablet 4   amoxicillin (AMOXIL) 500 MG tablet Take 1 tablet (500 mg total) by mouth every 8 (eight) hours. 21 tablet 0   atorvastatin (LIPITOR) 10 MG tablet Take 1 tablet (10 mg total) by mouth daily. 30 tablet 4   fluticasone (FLONASE) 50 MCG/ACT nasal spray Place 1 spray into both nostrils daily. 16 g 1   carbamide peroxide (DEBROX) 6.5 % OTIC solution Place 5 drops into the right ear 2 (two) times daily. 15 mL 0   metroNIDAZOLE (METROGEL) 0.75 % vaginal gel Place 1 Applicatorful vaginally 2 (two) times daily. 70 g 0   nitroGLYCERIN (NITROSTAT) 0.4 MG SL tablet Place 1 tablet (0.4 mg total) under the tongue every 5 (five) minutes as needed for chest pain. (Patient not taking: Reported on 10/24/2022) 30 tablet 0   traMADol (ULTRAM) 50 MG tablet Take 1 tablet (50 mg total) by mouth every 12 (twelve) hours as needed. 30 tablet 0   No current facility-administered medications on file prior to visit.    Allergies  Allergen Reactions   Codeine Shortness Of Breath    Social History   Socioeconomic History   Marital status: Single    Spouse name: Not on file   Number of  children: 0   Years of education: Associates   Highest education level: Not on file  Occupational History   Not on file  Tobacco Use   Smoking status: Former    Types: Cigarettes    Quit date: 1996    Years since quitting: 28.1   Smokeless tobacco: Never   Tobacco comments:    Weed  Scientific laboratory technician Use: Never used  Substance and Sexual Activity   Alcohol use: Yes    Comment: social   Drug use: Yes    Types: Marijuana   Sexual activity: Not Currently    Partners: Male    Birth control/protection: None  Other Topics Concern   Not on file  Social History Narrative   Boyfriend incarcerated 2014-2018   Social Determinants of Health    Financial Resource Strain: Not on file  Food Insecurity: Not on file  Transportation Needs: Not on file  Physical Activity: Not on file  Stress: Not on file  Social Connections: Not on file  Intimate Partner Violence: Not on file    Family History  Problem Relation Age of Onset   Diabetes Mother    Hypertension Mother    Hyperlipidemia Mother    Heart murmur Mother    Crohn's disease Mother    Hypertension Father    Stroke Father    Hyperlipidemia Father    Diabetes Sister    Hypertension Sister    Hyperlipidemia Sister    Hypertension Maternal Grandmother    Diabetes Maternal Grandmother    Osteoporosis Maternal Grandmother    Cirrhosis Maternal Grandfather    Thyroid disease Paternal Aunt    Breast cancer Neg Hx     Past Surgical History:  Procedure Laterality Date   oral surgery     PELVIC LAPAROSCOPY  1998/1999 `   pelvic adhesions and pain   TOE SURGERY Left    great toe   WISDOM TOOTH EXTRACTION      ROS: Review of Systems Negative except as stated above  PHYSICAL EXAM: BP 129/80 (BP Location: Left Arm, Patient Position: Sitting, Cuff Size: Normal)   Pulse 72   Temp 98.7 F (37.1 C) (Oral)   Ht '5\' 1"'$  (1.549 m)   Wt 145 lb (65.8 kg)   SpO2 99%   BMI 27.40 kg/m   Wt Readings from Last 3 Encounters:  02/23/23 145 lb (65.8 kg)  10/24/22 147 lb 6.4 oz (66.9 kg)  09/15/22 137 lb (62.1 kg)  Repeat blood pressure check manually 150/74. Blood pressure check with patient's wrist device 149/102  Physical Exam  General appearance - alert, well appearing, and in no distress Mental status - normal mood, behavior, speech, dress, motor activity, and thought processes Neck - supple, no significant adenopathy Chest - clear to auscultation, no wheezes, rales or rhonchi, symmetric air entry Heart - normal rate, regular rhythm, normal S1, S2, no murmurs, rubs, clicks or gallops Extremities - peripheral pulses normal, no pedal edema, no clubbing or cyanosis.   Radial and brachial pulses 3+ bilaterally.  Arms and hands are warm. Neuro: Cranial nerves grossly intact.  5/5 throughout in all 4 extremities.  Grip 5/5 bilaterally.  Gross and pinpoint sensation intact in both upper extremities.  Gait is normal. MSK: No tenderness on palpation of the cervical spine.  Good range of motion of the neck.    02/23/2023    3:13 PM 10/24/2022    3:33 PM 06/08/2022    3:58 PM  Depression screen PHQ 2/9  Decreased Interest 1  1  Down, Depressed, Hopeless '2 3 3  '$ PHQ - 2 Score '3 3 4  '$ Altered sleeping '2 2 2  '$ Tired, decreased energy '2 2 3  '$ Change in appetite '1 3 2  '$ Feeling bad or failure about yourself  '3 2 3  '$ Trouble concentrating 0 1 1  Moving slowly or fidgety/restless 0 0 0  Suicidal thoughts 0 0   PHQ-9 Score '11 13 15       '$ Latest Ref Rng & Units 10/24/2022    4:40 PM 09/15/2022    3:54 PM 06/20/2022    8:35 AM  CMP  Glucose 70 - 99 mg/dL  132  103   BUN 6 - 20 mg/dL  15  16   Creatinine 0.44 - 1.00 mg/dL  1.02  1.13   Sodium 135 - 145 mmol/L  139  140   Potassium 3.5 - 5.1 mmol/L  3.7  4.5   Chloride 98 - 111 mmol/L  105  105   CO2 22 - 32 mmol/L  25  22   Calcium 8.9 - 10.3 mg/dL  9.6  9.6   Total Protein 6.0 - 8.5 g/dL 6.7  7.2  6.7   Total Bilirubin 0.0 - 1.2 mg/dL 0.3  0.6  0.5   Alkaline Phos 44 - 121 IU/L 97  87  91   AST 0 - 40 IU/L 38  13  16   ALT 0 - 32 IU/L 52  11  12    Lipid Panel     Component Value Date/Time   CHOL 213 (H) 02/03/2021 1005   TRIG 78 02/03/2021 1005   HDL 56 02/03/2021 1005   CHOLHDL 3.8 02/03/2021 1005   LDLCALC 143 (H) 02/03/2021 1005    CBC    Component Value Date/Time   WBC 7.2 09/15/2022 1554   RBC 4.56 09/15/2022 1554   HGB 13.1 09/15/2022 1554   HGB 13.6 08/17/2021 1037   HCT 40.1 09/15/2022 1554   HCT 42.3 08/17/2021 1037   PLT 286 09/15/2022 1554   PLT 312 08/17/2021 1037   MCV 87.9 09/15/2022 1554   MCV 89 08/17/2021 1037   MCH 28.7 09/15/2022 1554   MCHC 32.7 09/15/2022 1554   RDW  13.2 09/15/2022 1554   RDW 12.6 08/17/2021 1037   LYMPHSABS 2.7 01/25/2018 1106   MONOABS 0.5 08/09/2013 1045   EOSABS 0.2 01/25/2018 1106   BASOSABS 0.0 01/25/2018 1106    ASSESSMENT AND PLAN: 1. Essential hypertension Patient reported elevated blood pressure readings consistently at home.  Repeat blood pressure done manually today elevated.  She may want to consider getting an automated blood pressure device that checks pressure in the upper arm.  Increase amlodipine to 10 mg daily.  Continue to monitor blood pressure and limit salt in the foods. - amLODipine (NORVASC) 10 MG tablet; Take 1 tablet (10 mg total) by mouth daily.  Dispense: 30 tablet; Refill: 6  2. Mixed hyperlipidemia Continue atorvastatin.  3. Numbness and tingling of right arm Of questionable etiology.  We will give a trial of gabapentin to take at bedtime for 2 weeks.  If no improvement or any worsening she will follow-up. - gabapentin (NEURONTIN) 600 MG tablet; Take 0.5 tablets (300 mg total) by mouth at bedtime.  Dispense: 15 tablet; Refill: 0 - Ambulatory referral to Neurology  4. Major depressive disorder, single episode, mild ([place]) Discussed management.  Patient not interested in medication at this time but would like to pursue  some counseling.  I have given her printed information sheet on behavioral health resources available in the [place] area  5. Gall stones 6. Inguinal hernia of left side without obstruction or gangrene Patient to follow-up with the surgeons office to determine timing of surgery.     Patient was given the opportunity to ask questions.  Patient verbalized understanding of the plan and was able to repeat key elements of the plan.   This documentation was completed using Radio producer.  Any transcriptional errors are unintentional.  No orders of the defined types were placed in this encounter.    Requested Prescriptions    No prescriptions requested or  ordered in this encounter    No follow-ups on file.  Karle Plumber, MD, FACP

## 2023-03-07 ENCOUNTER — Encounter: Payer: Self-pay | Admitting: Internal Medicine

## 2023-03-08 ENCOUNTER — Other Ambulatory Visit: Payer: Self-pay | Admitting: Internal Medicine

## 2023-03-08 DIAGNOSIS — J9801 Acute bronchospasm: Secondary | ICD-10-CM

## 2023-03-08 MED ORDER — ALBUTEROL SULFATE HFA 108 (90 BASE) MCG/ACT IN AERS: 2.0000 | INHALATION_SPRAY | Freq: Four times a day (QID) | RESPIRATORY_TRACT | 4 refills | Status: DC | PRN

## 2023-04-11 ENCOUNTER — Ambulatory Visit: Payer: No Typology Code available for payment source | Attending: Internal Medicine | Admitting: Pharmacist

## 2023-04-11 ENCOUNTER — Other Ambulatory Visit: Payer: Self-pay

## 2023-04-11 VITALS — BP 150/77 | HR 66

## 2023-04-11 DIAGNOSIS — I10 Essential (primary) hypertension: Secondary | ICD-10-CM | POA: Diagnosis not present

## 2023-04-11 MED ORDER — LOSARTAN POTASSIUM 25 MG PO TABS
25.0000 mg | ORAL_TABLET | Freq: Every day | ORAL | 0 refills | Status: DC
  Filled 2023-04-11: qty 90, 90d supply, fill #0

## 2023-04-11 NOTE — Progress Notes (Signed)
S:     No chief complaint on file.  52 y.o. female who presents for hypertension evaluation, education, and management.  PMH is significant for hypertension and hyperlipidemia.  Patient was referred and last seen by Primary Care Provider, Dr. Laural Benes, on 02/23/2023. At that visit, BP was 129/80 with clinic machine, 150/74 manually, and 149/102 with her home wrist cuff and amlodipine was increased from 5 mg to 10 mg.   Today, patient arrives in good spirits and presents without assistance. Denies blurred vision, swelling. Endorses headaches (possibly related to allergies) and dizziness. Denies ankle swelling with increased dose of amlodipine.   Patient reports hypertension was diagnosed in 2022.   Family/Social history:  Mother - diabetes, HTN, hyperlipidemia Father - hypertension, stroke, hyperlipidemia Sister - diabetes, hypertension, hyperlipidemia  Medication adherence reported. She reports sometimes missing her doses on Saturday mornings when she wakes up late and doesn't eat breakfast.  Patient has not  taken BP medications today.   Current antihypertensives include: amlodipine 10 mg daily  Antihypertensives tried in the past include: none  Reported home BP readings: has wrist BP monitor at home, has not taken BP at home since last visit with Dr. Laural Benes  Patient reported dietary habits: Eats 2-3 meals/day Adheres to sodium restricted diet  Caffeine - drinks 12 oz hot tea at work; soda 1-2 times a week Tries to stay away from fried food Occasionally has french fries  Patient-reported exercise habits: trying to exercise in the morning   ASCVD risk factors include: former smoker, HTN, HLD   O:  Vitals:   04/11/23 0906  BP: (!) 150/77  Pulse: 66    Last 3 Office BP readings: BP Readings from Last 3 Encounters:  04/11/23 (!) 150/77  02/23/23 129/80  10/24/22 (!) 142/90   BMET    Component Value Date/Time   NA 139 09/15/2022 1554   NA 140 06/20/2022 0835   K  3.7 09/15/2022 1554   CL 105 09/15/2022 1554   CO2 25 09/15/2022 1554   GLUCOSE 132 (H) 09/15/2022 1554   BUN 15 09/15/2022 1554   BUN 16 06/20/2022 0835   CREATININE 1.02 (H) 09/15/2022 1554   CREATININE 0.86 03/20/2014 1258   CALCIUM 9.6 09/15/2022 1554   GFRNONAA >60 09/15/2022 1554   GFRNONAA 70 08/09/2013 1045   GFRAA 78 02/03/2021 1005   GFRAA 81 08/09/2013 1045   Renal function: CrCl cannot be calculated (Patient's most recent lab result is older than the maximum 21 days allowed.).  Clinical ASCVD: No  The 10-year ASCVD risk score (Arnett DK, et al., 2019) is: 7.9%   Values used to calculate the score:     Age: 21 years     Sex: Female     Is Non-Hispanic African American: Yes     Diabetic: No     Tobacco smoker: No     Systolic Blood Pressure: 150 mmHg     Is BP treated: Yes     HDL Cholesterol: 56 mg/dL     Total Cholesterol: 213 mg/dL  A/P: Hypertension longstanding currently uncontrolled on current medications. BP goal < 130/80 mmHg. Medication adherence appears appropriate. -Started losartan 25 mg daily. 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 -Continued amlodipine 10 mg daily.  -Patient educated on purpose, proper use, and potential adverse effects of losartan.  -F/u labs ordered - CMP -Counseled on lifestyle modifications for blood pressure control including reduced  dietary sodium, increased exercise, adequate sleep. -Encouraged patient to check BP at home and bring log of readings to next visit. Counseled on proper use of home BP cuff.   Results reviewed and written information provided.    Written patient instructions provided. Patient verbalized understanding of treatment plan.  Total time in face to face counseling 30 minutes.    Follow-up:  Pharmacist 1 month. PCP clinic visit in July 2024.   Georga Hacking, Pharm.D PGY1 Pharmacy Resident 04/11/2023 9:09 AM

## 2023-04-12 LAB — BMP8+EGFR
BUN/Creatinine Ratio: 12 (ref 9–23)
BUN: 12 mg/dL (ref 6–24)
CO2: 20 mmol/L (ref 20–29)
Calcium: 9.1 mg/dL (ref 8.7–10.2)
Chloride: 107 mmol/L — ABNORMAL HIGH (ref 96–106)
Creatinine, Ser: 1.01 mg/dL — ABNORMAL HIGH (ref 0.57–1.00)
Glucose: 111 mg/dL — ABNORMAL HIGH (ref 70–99)
Potassium: 4.3 mmol/L (ref 3.5–5.2)
Sodium: 141 mmol/L (ref 134–144)
eGFR: 67 mL/min/{1.73_m2} (ref >59)

## 2023-05-11 ENCOUNTER — Encounter: Payer: Self-pay | Admitting: Pharmacist

## 2023-05-11 ENCOUNTER — Ambulatory Visit: Payer: No Typology Code available for payment source | Attending: Internal Medicine | Admitting: Pharmacist

## 2023-05-11 VITALS — BP 126/71 | HR 81

## 2023-05-11 DIAGNOSIS — I10 Essential (primary) hypertension: Secondary | ICD-10-CM | POA: Diagnosis not present

## 2023-05-11 MED ORDER — AMLODIPINE BESYLATE 10 MG PO TABS: 10.0000 mg | ORAL_TABLET | Freq: Every day | ORAL | 1 refills | Status: DC

## 2023-05-11 MED ORDER — LOSARTAN POTASSIUM 25 MG PO TABS: 25.0000 mg | ORAL_TABLET | Freq: Every day | ORAL | 1 refills | Status: DC

## 2023-05-11 NOTE — Progress Notes (Signed)
S:     No chief complaint on file.  52 y.o. female who presents for hypertension evaluation, education, and management.  PMH is significant for hypertension and hyperlipidemia.  Patient was referred and last seen by Primary Care Provider, Dr. Laural Benes, on 02/23/2023. Pharmacy saw her last month and added losartan.   Today, patient arrives in good spirits and presents without assistance. Denies blurred vision, swelling. No headaches or dizziness related to losartan or BP. She brings in her BP log for review today.   Family/Social history:  Mother - diabetes, HTN, hyperlipidemia Father - hypertension, stroke, hyperlipidemia Sister - diabetes, hypertension, hyperlipidemia  Medication adherence reported. Has not taken today.  Current antihypertensives include: amlodipine 10 mg daily, losartan 25 mg daily  Antihypertensives tried in the past include: none  Reported home BP readings: uses wrist cuff. Brings in record:  -SBP: 120 - 158 -DBP: 72 - 101  - Checks first thing in the morning, usually, before taking BP medications with a wrist cuff.   Patient reported dietary habits: Eats 2-3 meals/day Adheres to sodium restricted diet  Caffeine - drinks 12 oz hot tea at work; soda 1-2 times a week Tries to stay away from fried food Occasionally has french fries  Patient-reported exercise habits: trying to exercise in the morning   ASCVD risk factors include: former smoker, HTN, HLD   O:  Vitals:   05/11/23 0939  BP: 126/71  Pulse: 81    Last 3 Office BP readings: BP Readings from Last 3 Encounters:  05/11/23 126/71  04/11/23 (!) 150/77  02/23/23 129/80   BMET    Component Value Date/Time   NA 141 04/11/2023 0914   K 4.3 04/11/2023 0914   CL 107 (H) 04/11/2023 0914   CO2 20 04/11/2023 0914   GLUCOSE 111 (H) 04/11/2023 0914   GLUCOSE 132 (H) 09/15/2022 1554   BUN 12 04/11/2023 0914   CREATININE 1.01 (H) 04/11/2023 0914   CREATININE 0.86 03/20/2014 1258   CALCIUM 9.1  04/11/2023 0914   GFRNONAA >60 09/15/2022 1554   GFRNONAA 70 08/09/2013 1045   GFRAA 78 02/03/2021 1005   GFRAA 81 08/09/2013 1045   Renal function: CrCl cannot be calculated (Patient's most recent lab result is older than the maximum 21 days allowed.).  Clinical ASCVD: No  The 10-year ASCVD risk score (Arnett DK, et al., 2019) is: 4.2%   Values used to calculate the score:     Age: 23 years     Sex: Female     Is Non-Hispanic African American: Yes     Diabetic: No     Tobacco smoker: No     Systolic Blood Pressure: 126 mmHg     Is BP treated: Yes     HDL Cholesterol: 56 mg/dL     Total Cholesterol: 213 mg/dL  A/P: Hypertension longstanding currently at goal on current medications. BP goal < 130/80 mmHg. Medication adherence appears appropriate. -Continue losartan 25 mg daily. -Continued amlodipine 10 mg daily.  -Patient educated on purpose, proper use, and potential adverse effects of losartan.  -F/u labs ordered - BMP8+eGFR -Counseled on lifestyle modifications for blood pressure control including reduced dietary sodium, increased exercise, adequate sleep. -Encouraged patient to check BP at home and bring log of readings to next visit. Counseled on proper use of home BP cuff. I think her BP values at home are higher d/t taking in the AM before medications with a wrist cuff. Encouraged her to get a brachial cuff and take  her home BP AFTER  medications.   Results reviewed and written information provided.    Written patient instructions provided. Patient verbalized understanding of treatment plan.  Total time in face to face counseling 30 minutes.    Follow-up:  PCP clinic visit in July 2024.   Butch Penny, PharmD, P[place]y [place], CPP Clinical Pharmacist [facility] [phone]

## 2023-05-12 LAB — BMP8+EGFR
BUN/Creatinine Ratio: 17 (ref 9–23)
BUN: 17 mg/dL (ref 6–24)
CO2: 21 mmol/L (ref 20–29)
Calcium: 9.4 mg/dL (ref 8.7–10.2)
Chloride: 108 mmol/L — ABNORMAL HIGH (ref 96–106)
Creatinine, Ser: 1.02 mg/dL — ABNORMAL HIGH (ref 0.57–1.00)
Glucose: 110 mg/dL — ABNORMAL HIGH (ref 70–99)
Potassium: 4.2 mmol/L (ref 3.5–5.2)
Sodium: 143 mmol/L (ref 134–144)
eGFR: 66 mL/min/{1.73_m2} (ref >59)

## 2023-05-15 ENCOUNTER — Encounter: Payer: Self-pay | Admitting: Diagnostic Neuroimaging

## 2023-05-15 ENCOUNTER — Ambulatory Visit: Payer: No Typology Code available for payment source | Admitting: Diagnostic Neuroimaging

## 2023-05-15 VITALS — BP 135/81 | HR 91 | Ht 61.0 in | Wt 148.4 lb

## 2023-05-15 DIAGNOSIS — M25511 Pain in right shoulder: Secondary | ICD-10-CM

## 2023-05-15 DIAGNOSIS — M79601 Pain in right arm: Secondary | ICD-10-CM

## 2023-05-15 DIAGNOSIS — R200 Anesthesia of skin: Secondary | ICD-10-CM | POA: Diagnosis not present

## 2023-05-15 DIAGNOSIS — R202 Paresthesia of skin: Secondary | ICD-10-CM

## 2023-05-15 NOTE — Progress Notes (Signed)
[facility]  PATIENT: Misty Bailey DOB: 1971-04-12  REFERRING CLINICIAN: Marcine Matar, MD HISTORY FROM: patient  REASON FOR VISIT: new consult   HISTORICAL  CHIEF COMPLAINT:  Chief Complaint  Patient presents with   New Patient (Initial Visit)    Patient in room #7 and alone. Patient states she has numbness and tingling of right arm. Patient states the feeling has gotten worse with the range of motion with her arm.    HISTORY OF PRESENT ILLNESS:   52 year old female here for evaluation of numbness and pain in right arm.  Symptoms started around February 2024.  Has pain mainly in the right shoulder and biceps region.  Some numbness in right hand.  No neck pain.  No problems with left arm or bilateral lower extremities.  No prodromal accidents injuries or traumas.  Has limited range of motion of the shoulder on the right side.   REVIEW OF SYSTEMS: Full 14 system review of systems performed and negative with exception of: as per HPI.  ALLERGIES: Allergies  Allergen Reactions   Codeine Shortness Of Breath    HOME MEDICATIONS: Outpatient Medications Prior to Visit  Medication Sig Dispense Refill   albuterol (VENTOLIN HFA) 108 (90 Base) MCG/ACT inhaler Inhale 2 puffs into the lungs every 6 (six) hours as needed for wheezing or shortness of breath. 8 g 4   amLODipine (NORVASC) 10 MG tablet Take 1 tablet (10 mg total) by mouth daily. 90 tablet 1   atorvastatin (LIPITOR) 10 MG tablet Take 1 tablet (10 mg total) by mouth daily. 30 tablet 4   fluticasone (FLONASE) 50 MCG/ACT nasal spray Place 1 spray into both nostrils daily. 16 g 1   gabapentin (NEURONTIN) 600 MG tablet Take 0.5 tablets (300 mg total) by mouth at bedtime. 15 tablet 0   losartan (COZAAR) 25 MG tablet Take 1 tablet (25 mg total) by mouth daily. 90 tablet 1   amoxicillin (AMOXIL) 500 MG tablet Take 1 tablet (500 mg total) by mouth every 8 (eight) hours. 21 tablet 0   No  facility-administered medications prior to visit.    PAST MEDICAL HISTORY: Past Medical History:  Diagnosis Date   Anemia    Anxiety    Arthritis 02/23/2014   Rib Cage   Depression    Diverticulosis    Dysmenorrhea 05/08/2018   Essential hypertension 07/29/2019   HLD (hyperlipidemia)    Panic attacks    Suppurative hidradenitis     PAST SURGICAL HISTORY: Past Surgical History:  Procedure Laterality Date   oral surgery     PELVIC LAPAROSCOPY  1998/1999 `   pelvic adhesions and pain   TOE SURGERY Left    great toe   WISDOM TOOTH EXTRACTION      FAMILY HISTORY: Family History  Problem Relation Age of Onset   Diabetes Mother    Hypertension Mother    Hyperlipidemia Mother    Heart murmur Mother    Crohn's disease Mother    Hypertension Father    Stroke Father    Hyperlipidemia Father    Diabetes Sister    Hypertension Sister    Hyperlipidemia Sister    Hypertension Maternal Grandmother    Diabetes Maternal Grandmother    Osteoporosis Maternal Grandmother    Cirrhosis Maternal Grandfather    Thyroid disease Paternal Aunt    Breast cancer Neg Hx     SOCIAL HISTORY: Social History   Socioeconomic History   Marital status: Single    Spouse name:  Not on file   Number of children: 0   Years of education: Associates   Highest education level: Associate degree: occupational, Scientist, product/process development, or vocational program  Occupational History   Not on file  Tobacco Use   Smoking status: Former    Types: Cigarettes    Quit date: 1996    Years since quitting: 28.4   Smokeless tobacco: Never   Tobacco comments:    Weed  Building services engineer Use: Never used  Substance and Sexual Activity   Alcohol use: Yes    Comment: social   Drug use: Yes    Types: Marijuana   Sexual activity: Not Currently    Partners: Male    Birth control/protection: None  Other Topics Concern   Not on file  Social History Narrative   Boyfriend incarcerated 2014-2018   Social Determinants  of Health   Financial Resource Strain: Low Risk  (04/10/2023)   Overall Financial Resource Strain (CARDIA)    Difficulty of Paying Living Expenses: Not very hard  Food Insecurity: Food Insecurity Present (04/10/2023)   Hunger Vital Sign    Worried About Running Out of Food in the Last Year: Sometimes true    Ran Out of Food in the Last Year: Sometimes true  Transportation Needs: No Transportation Needs (04/10/2023)   PRAPARE - Administrator, Civil Service (Medical): No    Lack of Transportation (Non-Medical): No  Physical Activity: Insufficiently Active (04/10/2023)   Exercise Vital Sign    Days of Exercise per Week: 2 days    Minutes of Exercise per Session: 30 min  Stress: Stress Concern Present (04/10/2023)   [employer] of Occupational Health - Occupational Stress Questionnaire    Feeling of Stress : Very much  Social Connections: Unknown (04/10/2023)   Social Connection and Isolation Panel [NHANES]    Frequency of Communication with Friends and Family: More than three times a week    Frequency of Social Gatherings with Friends and Family: Twice a week    Attends Religious Services: More than 4 times per year    Active Member of [employer] or Organizations: Not on file    Attends Banker Meetings: Not on file    Marital Status: Never married  Intimate Partner Violence: Not on file     PHYSICAL EXAM  GENERAL EXAM/CONSTITUTIONAL: Vitals:  Vitals:   05/15/23 0814  BP: 135/81  Pulse: 91  Weight: 148 lb 6.4 oz (67.3 kg)  Height: 5\' 1"  (1.549 m)   Body mass index is 28.04 kg/m. Wt Readings from Last 3 Encounters:  05/15/23 148 lb 6.4 oz (67.3 kg)  02/23/23 145 lb (65.8 kg)  10/24/22 147 lb 6.4 oz (66.9 kg)   Patient is in no distress; well developed, nourished and groomed; neck is supple  CARDIOVASCULAR: Examination of carotid arteries is normal; no carotid bruits Regular rate and rhythm, no murmurs Examination of peripheral vascular system by  observation and palpation is normal  EYES: Ophthalmoscopic exam of optic discs and posterior segments is normal; no papilledema or hemorrhages No results found.  MUSCULOSKELETAL: Gait, strength, tone, movements noted in Neurologic exam below  NEUROLOGIC: MENTAL STATUS:      No data to display         awake, alert, oriented to person, place and time recent and remote memory intact normal attention and concentration language fluent, comprehension intact, naming intact fund of knowledge appropriate  CRANIAL NERVE:  2nd - no papilledema on fundoscopic exam 2nd,  3rd, 4th, 6th - pupils equal and reactive to light, visual fields full to confrontation, extraocular muscles intact, no nystagmus 5th - facial sensation symmetric 7th - facial strength symmetric 8th - hearing intact 9th - palate elevates symmetrically, uvula midline 11th - shoulder shrug symmetric 12th - tongue protrusion midline  MOTOR:  normal bulk and tone, full strength in the BUE, BLE; EXCEPT RIGHT UPPER EXT LIMITED BY PAIN (SHOULDER EXTERNAL ROTATION 3-4, ABDUCTION 4+; CANNOT ACTIVELY OR PASSIVELY ELEVATE ARM ABOVE PARALLEL)  SENSORY:  normal and symmetric to light touch, pinprick, temperature, vibration  COORDINATION:  finger-nose-finger, fine finger movements normal  REFLEXES:  deep tendon reflexes 1+ and symmetric  GAIT/STATION:  narrow based gait     DIAGNOSTIC DATA (LABS, IMAGING, TESTING) - I reviewed patient records, labs, notes, testing and imaging myself where available.  Lab Results  Component Value Date   WBC 7.2 09/15/2022   HGB 13.1 09/15/2022   HCT 40.1 09/15/2022   MCV 87.9 09/15/2022   PLT 286 09/15/2022      Component Value Date/Time   NA 143 05/11/2023 1209   K 4.2 05/11/2023 1209   CL 108 (H) 05/11/2023 1209   CO2 21 05/11/2023 1209   GLUCOSE 110 (H) 05/11/2023 1209   GLUCOSE 132 (H) 09/15/2022 1554   BUN 17 05/11/2023 1209   CREATININE 1.02 (H) 05/11/2023 1209    CREATININE 0.86 03/20/2014 1258   CALCIUM 9.4 05/11/2023 1209   PROT 6.7 10/24/2022 1640   ALBUMIN 4.3 10/24/2022 1640   AST 38 10/24/2022 1640   ALT 52 (H) 10/24/2022 1640   ALKPHOS 97 10/24/2022 1640   BILITOT 0.3 10/24/2022 1640   GFRNONAA >60 09/15/2022 1554   GFRNONAA 70 08/09/2013 1045   GFRAA 78 02/03/2021 1005   GFRAA 81 08/09/2013 1045   Lab Results  Component Value Date   CHOL 213 (H) 02/03/2021   HDL 56 02/03/2021   LDLCALC 143 (H) 02/03/2021   TRIG 78 02/03/2021   CHOLHDL 3.8 02/03/2021   Lab Results  Component Value Date   HGBA1C 5.8 03/20/2014   No results found for: "VITAMINB12" Lab Results  Component Value Date   TSH 0.980 03/20/2014       ASSESSMENT AND PLAN  52 y.o. year old female here with:   Dx:  1. Right arm pain   2. Numbness and tingling in right hand   3. Acute pain of right shoulder       PLAN:  RIGHT SHOULDER PAIN / RIGHT UPPER ARM PAIN > RIGHT HAND NUMBNESS (since Feb 2023; possible rotator cuff strain / sprain) - check labs (myopathy, neuropathy) - refer to sports medicine (eval for musculoskeletal strain, rotator cuff; then plan for PT / OT) - OTC ibuprofen, tylenol, lidocaine patch, diclofenac gel - monitor numbness in right hand; sensory and motor exam of hand is normal; may consider EMG/NCS in future (to eval for radiculopathy, plexopathy or neuropathy)  Orders Placed This Encounter  Procedures   CK   Aldolase   ANA,IFA RA Diag Pnl w/rflx Tit/Patn   Uric Acid   Hemoglobin A1c   AMB referral to sports medicine   Return for pending if symptoms worsen or fail to improve, pending test results.    Suanne Marker, MD 05/15/2023, 9:14 AM Certified in Neurology, Neurophysiology and Neuroimaging  [facility] [address], [address] [place], [place] [address] [phone]

## 2023-05-15 NOTE — Patient Instructions (Signed)
RIGHT SHOULDER PAIN / RIGHT UPPER ARM PAIN (since Feb 2023; possible rotator cuff strain / sprain) - check labs (myopathy, neuropathy) - refer to sports medicine (eval for musculoskeletal strain, rotator cuff; then plan for PT / OT) - OTC ibuprofen, tylenol, lidocaine patch, diclofenac gel - monitor numbness in right hand; may consider EMG/NCS

## 2023-05-16 LAB — HEMOGLOBIN A1C: Est. average glucose Bld gHb Est-mCnc: 137 mg/dL

## 2023-05-16 LAB — ANA,IFA RA DIAG PNL W/RFLX TIT/PATN

## 2023-05-17 LAB — URIC ACID: Uric Acid: 3.8 mg/dL (ref 3.0–7.2)

## 2023-05-17 LAB — ANA,IFA RA DIAG PNL W/RFLX TIT/PATN: Rheumatoid fact SerPl-aCnc: 17.6 IU/mL — ABNORMAL HIGH (ref <14.0)

## 2023-05-18 LAB — CK: Total CK: 73 U/L (ref 32–182)

## 2023-05-18 LAB — HEMOGLOBIN A1C: Hgb A1c MFr Bld: 6.4 % — ABNORMAL HIGH (ref 4.8–5.6)

## 2023-05-18 LAB — ALDOLASE: Aldolase: 9.6 U/L (ref 3.3–10.3)

## 2023-05-24 ENCOUNTER — Other Ambulatory Visit: Payer: Self-pay

## 2023-05-24 ENCOUNTER — Ambulatory Visit (INDEPENDENT_AMBULATORY_CARE_PROVIDER_SITE_OTHER): Payer: No Typology Code available for payment source

## 2023-05-24 ENCOUNTER — Ambulatory Visit: Payer: No Typology Code available for payment source | Admitting: Family Medicine

## 2023-05-24 VITALS — BP 172/90 | HR 89 | Ht 61.0 in | Wt 151.0 lb

## 2023-05-24 DIAGNOSIS — G8929 Other chronic pain: Secondary | ICD-10-CM

## 2023-05-24 DIAGNOSIS — M25511 Pain in right shoulder: Secondary | ICD-10-CM | POA: Diagnosis not present

## 2023-05-24 MED ORDER — PREDNISONE 50 MG PO TABS
50.0000 mg | ORAL_TABLET | Freq: Every day | ORAL | 0 refills | Status: DC
Start: 2023-05-24 — End: 2023-06-21

## 2023-05-24 MED ORDER — TIZANIDINE HCL 4 MG PO TABS: 4.0000 mg | ORAL_TABLET | Freq: Three times a day (TID) | ORAL | 1 refills | Status: DC | PRN

## 2023-05-24 MED ORDER — GABAPENTIN 100 MG PO CAPS: 100.0000 mg | ORAL_CAPSULE | Freq: Every evening | ORAL | 3 refills | Status: DC | PRN

## 2023-05-24 NOTE — Patient Instructions (Addendum)
Thank you for coming in today.   Please get an Xray today before you leave   You received an injection today. Seek immediate medical attention if the joint becomes red, extremely painful, or is oozing fluid.   I've referred you to Physical Therapy.  Let us know if you don't hear from them in one week.   Recheck in 4 weeks or sooner if not ok.

## 2023-05-24 NOTE — Progress Notes (Unsigned)
[name], PhD, LAT, ATC acting as a scribe for [name], MD.  Nataki L Pollman is a 52 y.o. female who presents to [facility] at [facility] today for R arm pain. She was seen for this issue by Neurology on May 20th who referred to [place]. Today, pt c/o R arm pain ongoing since Feb really worsening through May. Pt locates pain to R trapz, all over the r shoulder and into the upper arm. She does Clinical biochemist for [employer].  Neck pain: stiffness Radiates: yes UE Numbness/tingling: yes- if upper arm gets hit UE Weakness: yes Aggravates: typing, using a mouse, IR, fixing her hair Treatments tried: IBU, ice, heat,   Pertinent review of systems: No fevers or chills  Relevant historical information: Hypertension   Exam:  BP (!) 172/90   Pulse 89   Ht 5\' 1"  (1.549 m)   Wt 151 lb (68.5 kg)   SpO2 98%   BMI 28.53 kg/m  General: Well Developed, well nourished, and in no acute distress.   MSK: Right shoulder normal-appearing Extremely limited range of motion. Diffusely tender. Strength very limited with pain. Pulses cap refill and sensation are intact distally. Grip strength is intact distally.     Lab and Radiology Results  Procedure: Real-time Ultrasound Guided Injection of right shoulder glenohumeral joint posterior approach Device: Philips Affiniti 50G Images permanently stored and available for review in PACS Verbal informed consent obtained.  Discussed risks and benefits of procedure. Warned about infection, bleeding, hyperglycemia damage to structures among others. Patient expresses understanding and agreement Time-out conducted.   Noted no overlying erythema, induration, or other signs of local infection.   Skin prepped in a sterile fashion.   Local anesthesia: Topical Ethyl chloride.   With sterile technique and under real time ultrasound guidance: 40 mg of Kenalog and 2 ml Marcaine injected into glenohumeral joint. Fluid seen entering  the joint capsule.   Completed without difficulty   Advised to call if fevers/chills, erythema, induration, drainage, or persistent bleeding.   Images permanently stored and available for review in the ultrasound unit.  Impression: Technically successful ultrasound guided injection.   X-ray images right shoulder and C-spine obtained today personally and independently interpreted  C-spine: Loss of cervical lordosis.  More significant degenerative changes present at C5-6 and C6-7.  No acute fractures are visible.  Right shoulder: No acute fractures.  No severe degenerative changes.  Await formal radiology review      Assessment and Plan: 52 y.o. female with right shoulder pain extending to the arm.  Very limited range of motion.  I think the pain is multifactorial.  Initially frozen shoulder could be a possibility but she did not have immediate relief on injection indicating there is probably some other pain generators.  Cervical radiculopathy is also a possibility.  Will see how she does with the glenohumeral injection today.  I did prescribe gabapentin and tizanidine to use at bedtime for pain control.  I did prescribe prednisone that she can start taking in a day or 2 if not better.   PDMP not reviewed this encounter. Orders Placed This Encounter  Procedures   DG Cervical Spine 2 or 3 views    Standing Status:   Future    Number of Occurrences:   1    Standing Expiration Date:   05/23/2024    Order Specific Question:   Reason for Exam (SYMPTOM  OR DIAGNOSIS REQUIRED)    Answer:   cervical radiulitis  Order Specific Question:   Is patient pregnant?    Answer:   No    Order Specific Question:   Preferred imaging location?    Answer:   [facility]   DG Shoulder Right    Standing Status:   Future    Number of Occurrences:   1    Standing Expiration Date:   05/23/2024    Order Specific Question:   Reason for Exam (SYMPTOM  OR DIAGNOSIS REQUIRED)    Answer:   right  shoulder pain    Order Specific Question:   Preferred imaging location?    Answer:   [facility]    Order Specific Question:   Is patient pregnant?    Answer:   No   [place] LIMITED JOINT SPACE STRUCTURES UP RIGHT(NO LINKED CHARGES)    Order Specific Question:   Reason for Exam (SYMPTOM  OR DIAGNOSIS REQUIRED)    Answer:   right shoulder pain    Order Specific Question:   Preferred imaging location?    Answer:   [facility] referral to Physical Therapy    Referral Priority:   Routine    Referral Type:   Physical Medicine    Referral Reason:   Specialty Services Required    Requested Specialty:   Physical Therapy    Number of Visits Requested:   1   Meds ordered this encounter  Medications   predniSONE (DELTASONE) 50 MG tablet    Sig: Take 1 tablet (50 mg total) by mouth daily.    Dispense:  5 tablet    Refill:  0   gabapentin (NEURONTIN) 100 MG capsule    Sig: Take 1-3 capsules (100-300 mg total) by mouth at bedtime as needed.    Dispense:  60 capsule    Refill:  3   tiZANidine (ZANAFLEX) 4 MG tablet    Sig: Take 1 tablet (4 mg total) by mouth every 8 (eight) hours as needed for muscle spasms.    Dispense:  30 tablet    Refill:  1     Discussed warning signs or symptoms. Please see discharge instructions. Patient expresses understanding.   The above documentation has been reviewed and is accurate and complete [name], M.D.

## 2023-05-25 ENCOUNTER — Telehealth: Payer: Self-pay | Admitting: Family Medicine

## 2023-05-25 ENCOUNTER — Telehealth: Payer: Self-pay | Admitting: Diagnostic Neuroimaging

## 2023-05-25 ENCOUNTER — Telehealth: Payer: Self-pay | Admitting: Anesthesiology

## 2023-05-25 DIAGNOSIS — R202 Paresthesia of skin: Secondary | ICD-10-CM

## 2023-05-25 DIAGNOSIS — M79601 Pain in right arm: Secondary | ICD-10-CM

## 2023-05-25 DIAGNOSIS — M25511 Pain in right shoulder: Secondary | ICD-10-CM

## 2023-05-25 MED ORDER — HYDROCODONE-ACETAMINOPHEN 5-325 MG PO TABS: 1.0000 | ORAL_TABLET | Freq: Four times a day (QID) | ORAL | 0 refills | Status: DC | PRN

## 2023-05-25 NOTE — Telephone Encounter (Signed)
Patient called today stating her arm pain is worse.  I prescribed hydrocodone.  Okay to start taking the prednisone prescribed yesterday.  Please keep me updated.  Please let me know if she needs a work note.

## 2023-05-25 NOTE — Telephone Encounter (Signed)
Called pt and informed of lab results. Rheumatoid factor and A1c are elevated. Pt advised a referral for rheumatology will be placed also advised to schedule an appt with her PCP to follow up on her Hgb A1c. Pt verbalized understanding. Pt had no questions at this time but was encouraged to call back if questions arise.

## 2023-05-25 NOTE — Telephone Encounter (Addendum)
Called and spoke with patient and advised per Dr. [name]. Pt verbalized understanding. Pt would like a work not, will plan to return to work on Monday. Pt aware that note will be available via MyChart.

## 2023-05-25 NOTE — Telephone Encounter (Signed)
-----   Message from Suanne Marker, MD sent at 05/19/2023  8:59 PM EDT ----- Rheumatoid factor elevated. Consider rheumatology consult. Also A1c is elevated (needs PCP follow up). Other labs ok. -VRP

## 2023-05-25 NOTE — Telephone Encounter (Signed)
Referral faxed to [facility]. Phone: [phone] Fax: [phone]  

## 2023-05-25 NOTE — Progress Notes (Signed)
Right shoulder x-ray looks normal to radiology

## 2023-05-25 NOTE — Telephone Encounter (Signed)
Patient called stating that she was seen yesterday and given an injection.  The pain in her arm and shoulder has gotten much worse.  Please advise.

## 2023-05-25 NOTE — Progress Notes (Signed)
Cervical spine x-ray shows arthritis and evidence of neck spasm.  No fractures are visible.

## 2023-05-26 ENCOUNTER — Other Ambulatory Visit: Payer: Self-pay | Admitting: Surgery

## 2023-05-26 ENCOUNTER — Telehealth: Payer: Self-pay | Admitting: Family Medicine

## 2023-05-26 NOTE — Telephone Encounter (Signed)
Pt states she was advised to call with update after visit this week.  Pt feels worse with limited mobility in her R shoulder. Pt given xray results, has started pain meds but still waking at night.  Unsure what next step is, is waiting to here from PT.

## 2023-05-27 NOTE — Telephone Encounter (Signed)
Forwarding to Dr. Corey to review and advise.  

## 2023-05-29 ENCOUNTER — Ambulatory Visit (HOSPITAL_COMMUNITY)
Admission: RE | Admit: 2023-05-29 | Discharge: 2023-05-29 | Disposition: A | Discharge status: Home / Self Care | Payer: No Typology Code available for payment source | Source: Ambulatory Visit | Attending: Internal Medicine | Admitting: Internal Medicine

## 2023-05-29 ENCOUNTER — Encounter (HOSPITAL_COMMUNITY): Payer: Self-pay

## 2023-05-29 VITALS — BP 145/76 | HR 70 | Temp 98.4°F | Resp 17

## 2023-05-29 DIAGNOSIS — M25511 Pain in right shoulder: Secondary | ICD-10-CM | POA: Diagnosis not present

## 2023-05-29 MED ORDER — MELOXICAM 7.5 MG PO TABS: 7.5000 mg | ORAL_TABLET | Freq: Every day | ORAL | 0 refills | Status: DC

## 2023-05-29 NOTE — ED Triage Notes (Signed)
Pt having right shoulder and RUE since Feb. Was seen at her Neurologist and referred to Sports Medicine. Saw Sports Medicine on Friday and got an injection in shoulder, pain medication, gabapentin, prednisone and muscle relaxer and all are out providing relief. Told probably had frozen shoulder. Reports when moves arm certain way makes pain worse.

## 2023-05-29 NOTE — Discharge Instructions (Signed)
Please reach out to the sports medicine doctor for him to reevaluate you and possibly refer you to an orthopedic surgeon for advanced imaging of your right shoulder. If you have any other concerns please do not hesitate to reach out to the urgent care team.

## 2023-05-29 NOTE — ED Provider Notes (Signed)
[facility]    CSN: [id number] Arrival date & time: 05/29/23  1747      History   Chief Complaint Chief Complaint  Patient presents with   appt 6    HPI Misty Bailey is a 52 y.o. female with a history of right shoulder pain comes to urgent care complaining of persistent right shoulder pain.  Patient was recently evaluated by sports medicine provider and given a shot of steroids into the right shoulder.  Pain is persistent and aggravated by movement of the right shoulder.  No known relieving factors.  Patient has a prescription for narcotics which makes the patient drowsy but does not help with the pain.  She is currently not taking any oral anti-inflammatory agents.  Patient denies any trauma or heavy lifting.  No numbness or tingling in the upper extremities.  Patient has tried icing the right shoulder with no improvement in symptoms. HPI  Past Medical History:  Diagnosis Date   Anemia    Anxiety    Arthritis 02/23/2014   Rib Cage   Depression    Diverticulosis    Dysmenorrhea 05/08/2018   Essential hypertension 07/29/2019   HLD (hyperlipidemia)    Panic attacks    Suppurative hidradenitis     Patient Active Problem List   Diagnosis Date Noted   Influenza vaccine refused 01/04/2021   Inguinal hernia of left side without obstruction or gangrene 04/30/2020   Hyperlipidemia 07/30/2019   Syncope 07/29/2019   Essential hypertension 07/29/2019   Dysmenorrhea 05/08/2018   Herpes 03/21/2014   Suppurative hidradenitis    Depression     Past Surgical History:  Procedure Laterality Date   oral surgery     PELVIC LAPAROSCOPY  1998/1999 `   pelvic adhesions and pain   TOE SURGERY Left    great toe   WISDOM TOOTH EXTRACTION      OB History     Gravida  2   Para  1   Term      Preterm  1   AB  1   Living  0      SAB  1   IAB      Ectopic      Multiple      Live Births               Home Medications    Prior to Admission  medications   Medication Sig Start Date End Date Taking? Authorizing Provider  meloxicam (MOBIC) 7.5 MG tablet Take 1 tablet (7.5 mg total) by mouth daily. 05/29/23  Yes Aleta Manternach, Britta Mccreedy, MD  albuterol (VENTOLIN HFA) 108 (90 Base) MCG/ACT inhaler Inhale 2 puffs into the lungs every 6 (six) hours as needed for wheezing or shortness of breath. 03/08/23   Marcine Matar, MD  amLODipine (NORVASC) 10 MG tablet Take 1 tablet (10 mg total) by mouth daily. 05/11/23   Marcine Matar, MD  atorvastatin (LIPITOR) 10 MG tablet Take 1 tablet (10 mg total) by mouth daily. 10/24/22   Marcine Matar, MD  fluticasone (FLONASE) 50 MCG/ACT nasal spray Place 1 spray into both nostrils daily. 06/08/22   Claiborne Rigg, NP  gabapentin (NEURONTIN) 100 MG capsule Take 1-3 capsules (100-300 mg total) by mouth at bedtime as needed. 05/24/23   Rodolph Bong, MD  HYDROcodone-acetaminophen (NORCO/VICODIN) 5-325 MG tablet Take 1 tablet by mouth every 6 (six) hours as needed. 05/25/23   Rodolph Bong, MD  losartan (COZAAR) 25 MG tablet  Take 1 tablet (25 mg total) by mouth daily. 05/11/23   Marcine Matar, MD  predniSONE (DELTASONE) 50 MG tablet Take 1 tablet (50 mg total) by mouth daily. 05/24/23   Rodolph Bong, MD  tiZANidine (ZANAFLEX) 4 MG tablet Take 1 tablet (4 mg total) by mouth every 8 (eight) hours as needed for muscle spasms. 05/24/23   Rodolph Bong, MD    Family History Family History  Problem Relation Age of Onset   Diabetes Mother    Hypertension Mother    Hyperlipidemia Mother    Heart murmur Mother    Crohn's disease Mother    Hypertension Father    Stroke Father    Hyperlipidemia Father    Diabetes Sister    Hypertension Sister    Hyperlipidemia Sister    Hypertension Maternal Grandmother    Diabetes Maternal Grandmother    Osteoporosis Maternal Grandmother    Cirrhosis Maternal Grandfather    Thyroid disease Paternal Aunt    Breast cancer Neg Hx     Social History Social History    Tobacco Use   Smoking status: Former    Types: Cigarettes    Quit date: 1996    Years since quitting: 28.4   Smokeless tobacco: Never   Tobacco comments:    Weed  Building services engineer Use: Never used  Substance Use Topics   Alcohol use: Yes    Comment: social   Drug use: Yes    Types: Marijuana     Allergies   Codeine   Review of Systems Review of Systems As per HPI  Physical Exam Triage Vital Signs ED Triage Vitals  Enc Vitals Group     BP 05/29/23 1836 (!) 145/76     Pulse Rate 05/29/23 1836 70     Resp 05/29/23 1836 17     Temp 05/29/23 1836 98.4 F (36.9 C)     Temp Source 05/29/23 1836 Oral     SpO2 05/29/23 1836 98 %     Weight --      Height --      Head Circumference --      Peak Flow --      Pain Score 05/29/23 1834 7     Pain Loc --      Pain Edu? --      Excl. in GC? --    No data found.  Updated Vital Signs BP (!) 145/76 (BP Location: Left Arm)   Pulse 70   Temp 98.4 F (36.9 C) (Oral)   Resp 17   LMP 03/01/2023   SpO2 98%   Visual Acuity Right Eye Distance:   Left Eye Distance:   Bilateral Distance:    Right Eye Near:   Left Eye Near:    Bilateral Near:     Physical Exam Vitals and nursing note reviewed.  Constitutional:      General: She is not in acute distress.    Appearance: She is not ill-appearing.  Cardiovascular:     Rate and Rhythm: Normal rate and regular rhythm.  Pulmonary:     Effort: Pulmonary effort is normal.     Breath sounds: Normal breath sounds.  Musculoskeletal:     Comments: Limited range of motion of the right shoulder secondary to pain.  Skin:    General: Skin is warm.     Findings: No bruising.  Neurological:     General: No focal deficit present.     Mental Status: She is  alert.      UC Treatments / Results  Labs (all labs ordered are listed, but only abnormal results are displayed) Labs Reviewed - No data to display  EKG   Radiology No results found.  Procedures Procedures  (including critical care time)  Medications Ordered in UC Medications - No data to display  Initial Impression / Assessment and Plan / UC Course  I have reviewed the triage vital signs and the nursing notes.  Pertinent labs & imaging results that were available during my care of the patient were reviewed by me and considered in my medical decision making (see chart for details).     1.  Right shoulder pain: Patient is advised to continue current medications Meloxicam 15 mg orally daily has been added to the treatment regimen Patient is advised to follow-up with sports medicine providers.  She may benefit from orthopedic surgery evaluation. Return precautions given No indication for imaging at this time. Final Clinical Impressions(s) / UC Diagnoses   Final diagnoses:  Right shoulder pain, unspecified chronicity     Discharge Instructions      Please reach out to the sports medicine doctor for him to reevaluate you and possibly refer you to an orthopedic surgeon for advanced imaging of your right shoulder. If you have any other concerns please do not hesitate to reach out to the urgent care team.   ED Prescriptions     Medication Sig Dispense Auth. Provider   meloxicam (MOBIC) 7.5 MG tablet Take 1 tablet (7.5 mg total) by mouth daily. 30 tablet Mickle Campton, Britta Mccreedy, MD      PDMP not reviewed this encounter.   Merrilee Jansky, MD 05/29/23 [phone]

## 2023-05-30 ENCOUNTER — Encounter: Payer: Self-pay | Admitting: Family Medicine

## 2023-05-30 ENCOUNTER — Ambulatory Visit: Payer: No Typology Code available for payment source | Admitting: Family Medicine

## 2023-05-30 ENCOUNTER — Other Ambulatory Visit: Payer: Self-pay

## 2023-05-30 VITALS — BP 130/86 | HR 72 | Ht 61.0 in | Wt 150.0 lb

## 2023-05-30 DIAGNOSIS — M25511 Pain in right shoulder: Secondary | ICD-10-CM

## 2023-05-30 DIAGNOSIS — M542 Cervicalgia: Secondary | ICD-10-CM | POA: Diagnosis not present

## 2023-05-30 DIAGNOSIS — M5010 Cervical disc disorder with radiculopathy, unspecified cervical region: Secondary | ICD-10-CM

## 2023-05-30 DIAGNOSIS — G8929 Other chronic pain: Secondary | ICD-10-CM | POA: Diagnosis not present

## 2023-05-30 NOTE — Telephone Encounter (Signed)
Scheduled appt with pt for today.

## 2023-05-30 NOTE — Patient Instructions (Addendum)
Thank you for coming in today.   Follow up after your MRI's have been done to review results.   Referrals placed to nerve conduction study.   Referral placed to rheumatology.  Out of work for the next month, or until re-evaluation.

## 2023-05-30 NOTE — Progress Notes (Signed)
I, Stevenson Clinch, CMA acting as a scribe for [name], MD.  Misty Bailey is a 52 y.o. female who presents to [facility] today for worsening R shoulder pain. 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 Pt was last seen by Dr. [name] on 05/24/23 and was given a R GH steroid injection and was prescribed tizanidine, gabapentin, and prednisone. She was also referred to PT, but has not yet scheduled any visits. She called the office the following day, May 30th, and was advised to start taking the prednisone, and was prescribed hydrocodone. She was seen at the [facility] on 05/29/23 and was prescribe Meloxicam. In the interim, her neurologist ran some labs revealing an elevated rheumatoid factor and A1c and she's been referred to rheumatology.   Today, pt reports continued shoulder pain. Notes intense pain in the upper arm. Now having pain in the upper back. Sx are now causing night disturbance. Has not picked up Meloxicam yet.  She rates her pain as severe 10 out of 10 at times.  Pain is located in the right lateral shoulder and into the upper arm.  Does not travel past the elbow.  Pain ongoing since February.  She has not been able to work because the pain is so significant.  Dx testing: 05/24/23 R shoulder & C-spine XR  05/15/23 Labs  Pertinent review of systems: No fevers or chills  Relevant historical information: Hypertension   Exam:  BP 130/86   Pulse 72   Ht 5\' 1"  (1.549 m)   Wt 150 lb (68 kg)   LMP 03/01/2023   SpO2 99%   BMI 28.34 kg/m  General: Well Developed, well nourished, and in no acute distress.   MSK: Right shoulder normal. Tender palpation lateral shoulder and upper arm.  Decreased range of motion.  Decreased strength abduction.  C-spine decreased cervical motion.    Lab and Radiology Results  Diagnostic Limited MSK Ultrasound of: Right shoulder Biceps tendon intact normal. Distal biceps muscle belly is  normal-appearing but tender to palpation. Rotator cuff tendons appear to be intact without obvious retracted tear. Impression: Unclear cause shoulder pain.   DG Cervical Spine 2 or 3 views  Result Date: 05/25/2023 CLINICAL DATA:  Pain EXAM: CERVICAL SPINE - 2-3 VIEW COMPARISON:  None Available. FINDINGS: Reversal of normal lordosis. No other malalignment. Multilevel degenerative disc disease most marked at C4-5, C5-6, and C6-7 with anterior osteophytes. Probable tiny posterior osteophytes at these levels. The pre odontoid space and prevertebral soft tissues are normal. Mild uncovertebral degenerative changes noted. No other abnormalities. IMPRESSION: Degenerative changes as above. Electronically Signed   By: Gerome Sam III M.D.   On: 05/25/2023 10:50   DG Shoulder Right  Result Date: 05/25/2023 CLINICAL DATA:  Pain EXAM: RIGHT SHOULDER - 2+ VIEW COMPARISON:  None Available. FINDINGS: There is no evidence of fracture or dislocation. There is no evidence of arthropathy or other focal bone abnormality. Soft tissues are unremarkable. IMPRESSION: Negative. Electronically Signed   By: Gerome Sam III M.D.   On: 05/25/2023 10:49   I, [name], personally (independently) visualized and performed the interpretation of the images attached in this note.  Recent Results (from the past 2160 hour(s))  BMP8+eGFR     Status: Abnormal   Collection Time: 04/11/23  9:14 AM  Result Value Ref Range   Glucose 111 (H) 70 - 99 mg/dL   BUN 12 6 - 24 mg/dL   Creatinine, Ser  1.01 (H) 0.57 - 1.00 mg/dL   eGFR 67 >09 WJ/XBJ/4.78   BUN/Creatinine Ratio 12 9 - 23   Sodium 141 134 - 144 mmol/L   Potassium 4.3 3.5 - 5.2 mmol/L   Chloride 107 (H) 96 - 106 mmol/L   CO2 20 20 - 29 mmol/L   Calcium 9.1 8.7 - 10.2 mg/dL  GNF6+OZHY     Status: Abnormal   Collection Time: 05/11/23 12:09 PM  Result Value Ref Range   Glucose 110 (H) 70 - 99 mg/dL   BUN 17 6 - 24 mg/dL   Creatinine, Ser 8.65 (H) 0.57 - 1.00 mg/dL    eGFR 66 >78 IO/NGE/9.52   BUN/Creatinine Ratio 17 9 - 23   Sodium 143 134 - 144 mmol/L   Potassium 4.2 3.5 - 5.2 mmol/L   Chloride 108 (H) 96 - 106 mmol/L   CO2 21 20 - 29 mmol/L   Calcium 9.4 8.7 - 10.2 mg/dL  CK     Status: None   Collection Time: 05/15/23  9:42 AM  Result Value Ref Range   Total CK 73 32 - 182 U/L  Aldolase     Status: None   Collection Time: 05/15/23  9:42 AM  Result Value Ref Range   Aldolase 9.6 3.3 - 10.3 U/L  ANA,IFA RA Diag Pnl w/rflx Tit/Patn     Status: Abnormal   Collection Time: 05/15/23  9:42 AM  Result Value Ref Range   ANA Titer 1 Negative     Comment:                                      Negative   <1:80                                      Borderline  1:80                                      Positive   >1:80 ICAP nomenclature: AC-0 For more information about Hep-2 cell patterns use ANApatterns.org, the official website for the International Consensus on Antinuclear Antibody (ANA) Patterns (ICAP).    Rheumatoid fact SerPl-aCnc 17.6 (H) <14.0 IU/mL   Cyclic Citrullin Peptide Ab 9 0 - 19 units    Comment:                           Negative               <20                           Weak positive      20 - 39                           Moderate positive  40 - 59                           Strong positive        >59   Uric Acid     Status: None   Collection Time: 05/15/23  9:42 AM  Result Value Ref  Range   Uric Acid 3.8 3.0 - 7.2 mg/dL    Comment:            Therapeutic target for gout patients: <6.0  Hemoglobin A1c     Status: Abnormal   Collection Time: 05/15/23  9:42 AM  Result Value Ref Range   Hgb A1c MFr Bld 6.4 (H) 4.8 - 5.6 %    Comment:          Prediabetes: 5.7 - 6.4          Diabetes: >6.4          Glycemic control for adults with diabetes: <7.0    Est. average glucose Bld gHb Est-mCnc 137 mg/dL      Assessment and Plan: 52 y.o. female with right shoulder and upper arm pain.  Pain is severe and disabling.  Unfortunately  the underlying etiology is not clear.  She could have rotator cuff tendinopathy or tear or adhesive capsulitis or some other more typical or conventional shoulder pain diagnosis.  She could have cervical radiculopathy.  However neither typical shoulder diagnoses or cervical radiculopathy perfectly explain her symptoms.  She could have something more esoteric like [place]-Turner syndrome.  There could be some rheumatologic or inflammatory diagnosis.  She did have an elevated CCP otherwise rheumatology workup was normal.  Regardless she is experiencing severe disabling pain and we do not know why yet.  Plan for significant broad workup.  Proceed to MRI right shoulder and cervical spine.  Proceed to nerve conduction study through neurology and refer to rheumatology to broaden rheumatologic workup that is partially positive.  Note written to remain out of work.  Will refill medicines for pain if needed. Severe pain uncertain diagnosis.  PDMP not reviewed this encounter. Orders Placed This Encounter  Procedures   [place] LIMITED JOINT SPACE STRUCTURES UP RIGHT(NO LINKED CHARGES)    Order Specific Question:   Reason for Exam (SYMPTOM  OR DIAGNOSIS REQUIRED)    Answer:   right shoulder pain    Order Specific Question:   Preferred imaging location?    Answer:   [facility]   MR SHOULDER RIGHT WO CONTRAST    Standing Status:   Future    Standing Expiration Date:   05/29/2024    Order Specific Question:   What is the patient's sedation requirement?    Answer:   No Sedation    Order Specific Question:   Does the patient have a pacemaker or implanted devices?    Answer:   No    Order Specific Question:   Preferred imaging location?    Answer:   [facility] (table limit-350lbs)   MR CERVICAL SPINE WO CONTRAST    Standing Status:   Future    Standing Expiration Date:   06/29/2023    Order Specific Question:   What is the patient's sedation requirement?    Answer:   No Sedation     Order Specific Question:   Does the patient have a pacemaker or implanted devices?    Answer:   No    Order Specific Question:   Preferred imaging location?    Answer:   [facility] (table limit-350lbs)   Ambulatory referral to Neurology    Referral Priority:   Routine    Referral Type:   Consultation    Referral Reason:   Specialty Services Required    Requested Specialty:   Neurology    Number of Visits Requested:   1   Ambulatory  referral to Rheumatology    Referral Priority:   Routine    Referral Type:   Consultation    Referral Reason:   Specialty Services Required    Requested Specialty:   Rheumatology    Number of Visits Requested:   1   NCV with EMG(electromyography)    right    Standing Status:   Future    Standing Expiration Date:   05/29/2024    Order Specific Question:   Where should this test be performed?    Answer:   [facility]   No orders of the defined types were placed in this encounter.    Discussed warning signs or symptoms. Please see discharge instructions. Patient expresses understanding.   The above documentation has been reviewed and is accurate and complete [name], M.D.

## 2023-05-30 NOTE — Telephone Encounter (Signed)
I fear we are missing something.  I would like you to come back and see me if possible. Phone number to call for PT is [phone].

## 2023-05-30 NOTE — Telephone Encounter (Signed)
Please reach out to pt to assist with scheduling follow-up appointment with Dr. [name]. Thank you!

## 2023-06-01 NOTE — Therapy (Signed)
OUTPATIENT PHYSICAL THERAPY SHOULDER EVALUATION   Patient Name: Misty Bailey MRN: [id number] DOB:03-Mar-1971, 52 y.o., female Today's Date: 06/05/2023  END OF SESSION:  PT End of Session - 06/05/23 1020     Visit Number 1    Number of Visits 16    Date for PT Re-Evaluation 08/28/23    Authorization Type aetna    PT Start Time 1021    PT Stop Time 1106    PT Time Calculation (min) 45 min    Activity Tolerance Patient limited by pain    Behavior During Therapy Anxious             Past Medical History:  Diagnosis Date   Anemia    Anxiety    Arthritis 02/23/2014   Rib Cage   Depression    Diverticulosis    Dysmenorrhea 05/08/2018   Essential hypertension 07/29/2019   HLD (hyperlipidemia)    Panic attacks    Suppurative hidradenitis    Past Surgical History:  Procedure Laterality Date   oral surgery     PELVIC LAPAROSCOPY  1998/1999 `   pelvic adhesions and pain   TOE SURGERY Left    great toe   WISDOM TOOTH EXTRACTION     Patient Active Problem List   Diagnosis Date Noted   Influenza vaccine refused 01/04/2021   Inguinal hernia of left side without obstruction or gangrene 04/30/2020   Hyperlipidemia 07/30/2019   Syncope 07/29/2019   Essential hypertension 07/29/2019   Dysmenorrhea 05/08/2018   Herpes 03/21/2014   Suppurative hidradenitis    Depression     PCP: Marcine Matar, MD  REFERRING PROVIDER: Rodolph Bong, MD  REFERRING DIAG: [phone] (ICD-10-CM) - Chronic right shoulder pain  THERAPY DIAG:  Acute pain of right shoulder  Muscle weakness (generalized)  Rationale for Evaluation and Treatment: Rehabilitation  ONSET DATE: 01/2023  SUBJECTIVE:                                                                                                                                                                                      SUBJECTIVE STATEMENT: States that she started having pain and numbness in her right fingertips in Feb.  States that her ROM has decreased since February. States she has a lot of pain and feels like the muscle in the back of her arm Is aggravated where even just bushing her teeth bothers her. States that the shoulder pain comes and goes with movement. States it is getting worse and she can't take care herself. States she has difficulties going to the backroom because she cannot reach well.  Reports no MOI just started bothering her  States that  she is not having the numbness she used to do. States she has tightness in her neck which is baseline for her now.   States that the shot and prednisone dose pack did not help and actually the shot made her pain worse.  Hand dominance: Right  PERTINENT HISTORY: HTN, pre diabetic, RA factor getting follow-up with rheumatology  PAIN:  Are you having pain? Yes: NPRS scale: 9/10 Pain location: shoulder and back of arm Pain description: tightness/dull achy, numbness, radiating up to neck Aggravating factors: moving, ADLs Relieving factors: rest, medication  PRECAUTIONS: None  WEIGHT BEARING RESTRICTIONS: No  FALLS:  Has patient fallen in last 6 months? 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   FINDINGS: There is no evidence of fracture or dislocation. There is no evidence of arthropathy or other focal bone abnormality. Soft tissues are unremarkable.   IMPRESSION: Negative.       Cervical Spine FINDINGS: Reversal of normal lordosis. No other malalignment. Multilevel degenerative disc disease most marked at C4-5, C5-6,  and C6-7 with anterior osteophytes. Probable tiny posterior osteophytes at these levels. The pre odontoid space and prevertebral soft tissues are normal. Mild uncovertebral degenerative changes noted. No other abnormalities.   IMPRESSION: Degenerative changes as above. PATIENT SURVEYS:  FOTO 32% function  COGNITION: Overall cognitive status: Within functional limits for tasks assessed     SENSATION: Not tested  POSTURE: Sacral sitting, rounded shoulders, forward head.  Neck ROM tight on right with all motions and 50% limited with left ROT WNL other motion   UE Measurements - NT MMT due pain on right Upper Extremity Right EVAL Left EVAL   A/PROM MMT A/PROM MMT  Shoulder Flexion 30*  WFL 4+  Shoulder Extension      Shoulder Abduction 30*  WFL 4+  Shoulder Adduction      Shoulder Internal Rotation Reaches to right side hip*  Reaches to T8 SP 4  Shoulder External Rotation Reaches to side of right face *  Reaches to T2 SP  4-  Elbow Flexion      Elbow Extension      Wrist Flexion      Wrist Extension      Wrist Supination WNL*  WNL   Wrist Pronation WNL  WNL   Wrist Ulnar Deviation      Wrist Radial Deviation      Grip Strength NA  NA     (Blank rows = not tested)   * pain ROM by visual estimation    JOINT MOBILITY TESTING:  Unable to test secondary to guarding and pain  PALPATION:  Tenderness throughout right pec , biceps, rotator cuff and triceps   TODAY'S TREATMENT:  DATE:  06/05/2023  Therapeutic Exercise:  Aerobic: Supine: Educated in 90/90 supported position 5 minutes Prone:  Seated:  Standing: Neuromuscular Re-education: Long exhale breathing in seated and in supine position 8 minutes Manual Therapy: Therapeutic Activity: Self Care: On general nutrition advice to reduce pain and inflammation as well as managing  excessive stress sugars secondary to prediabetic and how this relates to current presentation and condition Trigger Point Dry Needling:  Modalities: thermotherapy to right shoulder/neck in supine in 90/90 support   PATIENT EDUCATION:  Education details: on current presentation, on HEP, on clinical outcomes score and POC Person educated: Patient Education method: Programmer, multimedia, Demonstration, and Handouts Education comprehension: verbalized understanding   HOME EXERCISE PROGRAM: No MedBridge at this time just Long exhale breathing  ASSESSMENT:  CLINICAL IMPRESSION: Patient presents to physical therapy with severe right shoulder pain that limits patient's ability to perform daily dressing and bathing tasks as well as limits her ability to perform required tasks at work.  Patient with significant guarding and pain throughout right shoulder girdle demonstrating range of motion and strength deficits.  Educated patient current presentation which at this time presents similar to adhesive capsulitis.  Discussed and practiced breathing strategies to reduce constant tension in shoulders and body as well as improving down-regulation strategies for central nervous system.  Patient would greatly benefit from skilled physical therapy to improve overall functional range of motion, quality of life and help her return her to work safely.  OBJECTIVE IMPAIRMENTS: decreased activity tolerance, decreased mobility, decreased ROM, decreased strength, impaired tone, impaired UE functional use, postural dysfunction, and pain.   ACTIVITY LIMITATIONS: carrying, lifting, sleeping, transfers, bed mobility, bathing, toileting, dressing, self feeding, and reach over head  PARTICIPATION LIMITATIONS: meal prep, cleaning, laundry, driving, shopping, and occupation  PERSONAL FACTORS: Fitness, Time since onset of injury/illness/exacerbation, and 1-2 comorbidities: pre diabetic, RA factor  are also affecting patient's functional  outcome.   REHAB POTENTIAL: Good  CLINICAL DECISION MAKING: Stable/uncomplicated  EVALUATION COMPLEXITY: Low   GOALS: Goals reviewed with patient? yes  SHORT TERM GOALS: Target date: 07/17/2023  Patient will be independent in self management strategies to improve quality of life and functional outcomes. Baseline: New Program Goal status: INITIAL  2.  Patient will report at least 25 % improvement in overall symptoms and/or function to demonstrate improved functional mobility Baseline: 0% better Goal status: INITIAL  3.  Patient will be able to demonstrate pain-free bed mobilities improved transitional movements Baseline: Painful Goal status: INITIAL  4.  Patient will demonstrate at least 90 degrees of active shoulder flexion and right upper extremity to improve ability to perform daily bathing and dressing tasks Baseline: Unable Goal status: INITIAL    LONG TERM GOALS: Target date: 08/28/2023   Patient will report at least 50% improvement in overall symptoms and/or function to demonstrate improved functional mobility Baseline: 0% better Goal status: INITIAL  2.  Patient will improve score on FOTO outcomes measure to projected score to demonstrate overall improved function and QOL Baseline: see above Goal status: INITIAL  3.  Patient will demonstrate at least 20 degrees of external rotation and right upper extremity to improve ability to use mouth throughout the day at work Baseline: Unable Goal status: INITIAL  4.  Patient will demonstrate at least 120 degrees of shoulder scaption to improve ability to reach up overhead and bathe and dress Baseline: Unable Goal status: INITIAL   PLAN:  PT FREQUENCY: 1-2x/week for total of 16 visits over 12 weeks certification.  PT DURATION:  12 weeks  PLANNED INTERVENTIONS: Therapeutic exercises, Therapeutic activity, Neuromuscular re-education, Balance training, Gait training, Patient/Family education, Self Care, Joint  mobilization, Joint manipulation, Stair training, Vestibular training, Canalith repositioning, Orthotic/Fit training, Prosthetic training, DME instructions, Aquatic Therapy, Dry Needling, Electrical stimulation, Spinal manipulation, Spinal mobilization, Cryotherapy, Moist heat, Taping, Traction, Ultrasound, Ionotophoresis 4mg /ml Dexamethasone, Manual therapy, and Re-evaluation.   PLAN FOR NEXT SESSION: Pain management strategies, breathing techniques, range of motion and manual interventions as tolerated, modalities as needed   11:56 AM, 06/05/23 Tereasa Coop, DPT Physical Therapy with [facility]

## 2023-06-02 ENCOUNTER — Telehealth: Payer: Self-pay

## 2023-06-02 NOTE — Telephone Encounter (Signed)
FMLA form and letter from patient received in office on 06/01/23.   FMLA form due 06/19/23

## 2023-06-05 ENCOUNTER — Encounter: Payer: Self-pay | Admitting: Physical Therapy

## 2023-06-05 ENCOUNTER — Ambulatory Visit: Payer: No Typology Code available for payment source | Admitting: Physical Therapy

## 2023-06-05 DIAGNOSIS — M25511 Pain in right shoulder: Secondary | ICD-10-CM

## 2023-06-05 DIAGNOSIS — M6281 Muscle weakness (generalized): Secondary | ICD-10-CM

## 2023-06-06 NOTE — Telephone Encounter (Signed)
I also called pt and left VM to call the office to discuss form.

## 2023-06-08 ENCOUNTER — Encounter: Payer: Self-pay | Admitting: Family Medicine

## 2023-06-08 NOTE — Telephone Encounter (Signed)
Forwarding to Dr. Corey to review and advise.  

## 2023-06-09 MED ORDER — HYDROCODONE-ACETAMINOPHEN 5-325 MG PO TABS: 1.0000 | ORAL_TABLET | Freq: Four times a day (QID) | ORAL | 0 refills | Status: DC | PRN

## 2023-06-12 NOTE — Therapy (Unsigned)
OUTPATIENT PHYSICAL THERAPY SHOULDER TREATMENT   Patient Name: Misty Bailey MRN: [id number] DOB:[birth year], 53 y.o., female Today's Date: 06/13/2023  END OF SESSION:  PT End of Session - 06/13/23 0933     Visit Number 2    Number of Visits 16    Date for PT Re-Evaluation 08/28/23    Authorization Type aetna    PT Start Time 0934    PT Stop Time 1012    PT Time Calculation (min) 38 min    Activity Tolerance Patient limited by pain    Behavior During Therapy Anxious              Past Medical History:  Diagnosis Date   Anemia    Anxiety    Arthritis 02/23/2014   Rib Cage   Depression    Diverticulosis    Dysmenorrhea 05/08/2018   Essential hypertension 07/29/2019   HLD (hyperlipidemia)    Panic attacks    Suppurative hidradenitis    Past Surgical History:  Procedure Laterality Date   oral surgery     PELVIC LAPAROSCOPY  1998/1999 `   pelvic adhesions and pain   TOE SURGERY Left    great toe   WISDOM TOOTH EXTRACTION     Patient Active Problem List   Diagnosis Date Noted   Influenza vaccine refused 01/04/2021   Inguinal hernia of left side without obstruction or gangrene 04/30/2020   Hyperlipidemia 07/30/2019   Syncope 07/29/2019   Essential hypertension 07/29/2019   Dysmenorrhea 05/08/2018   Herpes 03/21/2014   Suppurative hidradenitis    Depression     PCP: Marcine Matar, MD  REFERRING PROVIDER: Rodolph Bong, MD  REFERRING DIAG: [phone] (ICD-10-CM) - Chronic right shoulder pain  THERAPY DIAG:  Acute pain of right shoulder  Muscle weakness (generalized)  Rationale for Evaluation and Treatment: Rehabilitation  ONSET DATE: 01/2023  SUBJECTIVE:                                                                                                                                                                                      SUBJECTIVE STATEMENT: 06/13/2023 States everything feels about the same. States that she has been using  the heat and has been trying to do her breathing exercises. The breathing seems to help a little bit but she can't wrap it wound her arm.   Eval: States that she started having pain and numbness in her right fingertips in Feb. States that her ROM has decreased since February. States she has a lot of pain and feels like the muscle in the back of her arm Is aggravated where even just bushing her teeth bothers her. States  that the shoulder pain comes and goes with movement. States it is getting worse and she can't take care herself. States she has difficulties going to the backroom because she cannot reach well.  Reports no MOI just started bothering her  States that she is not having the numbness she used to do. States she has tightness in her neck which is baseline for her now.   States that the shot and prednisone dose pack did not help and actually the shot made her pain worse.  Hand dominance: Right  PERTINENT HISTORY: HTN, pre diabetic, RA factor getting follow-up with rheumatology  PAIN:  Are you having pain? Yes: NPRS scale: 7/10 Pain location: shoulder and back of arm Pain description: tightness/dull achy, numbness, radiating up to neck Aggravating factors: moving, ADLs Relieving factors: rest, medication  PRECAUTIONS: None  WEIGHT BEARING RESTRICTIONS: No  FALLS:  Has patient fallen in last 6 months? 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   FINDINGS: There is no evidence of fracture or dislocation. There is no evidence of arthropathy or other focal  bone abnormality. Soft tissues are unremarkable.   IMPRESSION: Negative.       Cervical Spine FINDINGS: Reversal of normal lordosis. No other malalignment. Multilevel degenerative disc disease most marked at C4-5, C5-6, and C6-7 with anterior osteophytes. Probable tiny posterior osteophytes at these levels. The pre odontoid space and prevertebral soft tissues are normal. Mild uncovertebral degenerative changes noted. No other abnormalities.   IMPRESSION: Degenerative changes as above. PATIENT SURVEYS:  FOTO 32% function  COGNITION: Overall cognitive status: Within functional limits for tasks assessed     SENSATION: Not tested  POSTURE: Sacral sitting, rounded shoulders, forward head.  Neck ROM tight on right with all motions and 50% limited with left ROT WNL other motion   UE Measurements - NT MMT due pain on right Upper Extremity Right EVAL Left EVAL   A/PROM MMT A/PROM MMT  Shoulder Flexion 30*  WFL 4+  Shoulder Extension      Shoulder Abduction 30*  WFL 4+  Shoulder Adduction      Shoulder Internal Rotation Reaches to right side hip*  Reaches to T8 SP 4  Shoulder External Rotation Reaches to side of right face *  Reaches to T2 SP  4-  Elbow Flexion      Elbow Extension      Wrist Flexion      Wrist Extension      Wrist Supination WNL*  WNL   Wrist Pronation WNL  WNL   Wrist Ulnar Deviation      Wrist Radial Deviation      Grip Strength NA  NA     (Blank rows = not tested)   * pain ROM by visual estimation    JOINT MOBILITY TESTING:  Unable to test secondary to guarding and pain  PALPATION:  Tenderness throughout right pec , biceps, rotator cuff and triceps   TODAY'S TREATMENT:  DATE:  06/13/2023  Therapeutic Exercise:  Aerobic: Supine: shoulder IR iso hand on stomach - 5" holds 2 minutes - R, scapular retraction  5" holds 2 minutes, cervical rotation 2 minutes, cervical side bending 1 minute, LTR 2 minutes Prone:  Seated: cervical side bending 2 minutes   Standing: Neuromuscular Re-education: Long exhale breathing  in supine position 9 minutes Manual Therapy: STM to right arm and neck - tolerated well  Therapeutic Activity: Self Care:   Trigger Point Dry Needling:  Modalities: thermotherapy to right shoulder/neck in supine in 90/90 support   PATIENT EDUCATION:  Education details: on HEP, on indirect stretches / ROM Person educated: Patient Education method: Explanation, Demonstration, and Handouts Education comprehension: verbalized understanding   HOME EXERCISE PROGRAM: [id number]  ASSESSMENT:  CLINICAL IMPRESSION: 06/13/2023 Session focused on education and ensuring patient that no motion would occur at arm with exercises. Focused on cervical, lumbar and scapular isometrics and motions which were tolerated well. Cued patient to breath during exercises as she has a tendency to hold breath with all new movements. Tolerated manual and thermo therapy well. No increase in pain noted end of session. Added new exercises to HEP. Will continue with current POC as tolerated.   Eval: Patient presents to physical therapy with severe right shoulder pain that limits patient's ability to perform daily dressing and bathing tasks as well as limits her ability to perform required tasks at work.  Patient with significant guarding and pain throughout right shoulder girdle demonstrating range of motion and strength deficits.  Educated patient current presentation which at this time presents similar to adhesive capsulitis.  Discussed and practiced breathing strategies to reduce constant tension in shoulders and body as well as improving down-regulation strategies for central nervous system.  Patient would greatly benefit from skilled physical therapy to improve overall functional range of motion, quality of life and  help her return her to work safely.  OBJECTIVE IMPAIRMENTS: decreased activity tolerance, decreased mobility, decreased ROM, decreased strength, impaired tone, impaired UE functional use, postural dysfunction, and pain.   ACTIVITY LIMITATIONS: carrying, lifting, sleeping, transfers, bed mobility, bathing, toileting, dressing, self feeding, and reach over head  PARTICIPATION LIMITATIONS: meal prep, cleaning, laundry, driving, shopping, and occupation  PERSONAL FACTORS: Fitness, Time since onset of injury/illness/exacerbation, and 1-2 comorbidities: pre diabetic, RA factor  are also affecting patient's functional outcome.   REHAB POTENTIAL: Good  CLINICAL DECISION MAKING: Stable/uncomplicated  EVALUATION COMPLEXITY: Low   GOALS: Goals reviewed with patient? yes  SHORT TERM GOALS: Target date: 07/17/2023  Patient will be independent in self management strategies to improve quality of life and functional outcomes. Baseline: New Program Goal status: INITIAL  2.  Patient will report at least 25 % improvement in overall symptoms and/or function to demonstrate improved functional mobility Baseline: 0% better Goal status: INITIAL  3.  Patient will be able to demonstrate pain-free bed mobilities improved transitional movements Baseline: Painful Goal status: INITIAL  4.  Patient will demonstrate at least 90 degrees of active shoulder flexion and right upper extremity to improve ability to perform daily bathing and dressing tasks Baseline: Unable Goal status: INITIAL    LONG TERM GOALS: Target date: 08/28/2023   Patient will report at least 50% improvement in overall symptoms and/or function to demonstrate improved functional mobility Baseline: 0% better Goal status: INITIAL  2.  Patient will improve score on FOTO outcomes measure to projected score to demonstrate overall improved function and QOL Baseline: see above Goal status: INITIAL  3.  Patient  will demonstrate at least 20  degrees of external rotation and right upper extremity to improve ability to use mouth throughout the day at work Baseline: Unable Goal status: INITIAL  4.  Patient will demonstrate at least 120 degrees of shoulder scaption to improve ability to reach up overhead and bathe and dress Baseline: Unable Goal status: INITIAL   PLAN:  PT FREQUENCY: 1-2x/week for total of 16 visits over 12 weeks certification.  PT DURATION: 12 weeks  PLANNED INTERVENTIONS: Therapeutic exercises, Therapeutic activity, Neuromuscular re-education, Balance training, Gait training, Patient/Family education, Self Care, Joint mobilization, Joint manipulation, Stair training, Vestibular training, Canalith repositioning, Orthotic/Fit training, Prosthetic training, DME instructions, Aquatic Therapy, Dry Needling, Electrical stimulation, Spinal manipulation, Spinal mobilization, Cryotherapy, Moist heat, Taping, Traction, Ultrasound, Ionotophoresis 4mg /ml Dexamethasone, Manual therapy, and Re-evaluation.   PLAN FOR NEXT SESSION: Pain management strategies, breathing techniques, range of motion and manual interventions as tolerated, modalities as needed   10:12 AM, 06/13/23 Tereasa Coop, DPT Physical Therapy with [facility]

## 2023-06-13 ENCOUNTER — Encounter: Payer: Self-pay | Admitting: Physical Therapy

## 2023-06-13 ENCOUNTER — Ambulatory Visit: Payer: No Typology Code available for payment source | Admitting: Physical Therapy

## 2023-06-13 DIAGNOSIS — M25511 Pain in right shoulder: Secondary | ICD-10-CM | POA: Diagnosis not present

## 2023-06-13 DIAGNOSIS — M6281 Muscle weakness (generalized): Secondary | ICD-10-CM | POA: Diagnosis not present

## 2023-06-15 ENCOUNTER — Ambulatory Visit (INDEPENDENT_AMBULATORY_CARE_PROVIDER_SITE_OTHER): Payer: No Typology Code available for payment source | Admitting: Diagnostic Neuroimaging

## 2023-06-15 ENCOUNTER — Encounter: Payer: No Typology Code available for payment source | Admitting: Physical Therapy

## 2023-06-15 ENCOUNTER — Ambulatory Visit: Payer: Self-pay | Admitting: Diagnostic Neuroimaging

## 2023-06-15 VITALS — Ht 61.0 in | Wt 148.0 lb

## 2023-06-15 DIAGNOSIS — R202 Paresthesia of skin: Secondary | ICD-10-CM

## 2023-06-15 DIAGNOSIS — M25511 Pain in right shoulder: Secondary | ICD-10-CM

## 2023-06-15 DIAGNOSIS — Z0289 Encounter for other administrative examinations: Secondary | ICD-10-CM

## 2023-06-15 DIAGNOSIS — M5010 Cervical disc disorder with radiculopathy, unspecified cervical region: Secondary | ICD-10-CM

## 2023-06-15 DIAGNOSIS — M79601 Pain in right arm: Secondary | ICD-10-CM

## 2023-06-15 DIAGNOSIS — G8929 Other chronic pain: Secondary | ICD-10-CM

## 2023-06-15 MED ORDER — LORAZEPAM 0.5 MG PO TABS: ORAL_TABLET | ORAL | 0 refills | Status: DC

## 2023-06-15 NOTE — Addendum Note (Signed)
Addended by: Rodolph Bong on: 06/15/2023 07:48 AM   Modules accepted: Orders

## 2023-06-16 ENCOUNTER — Other Ambulatory Visit: Payer: Self-pay

## 2023-06-19 ENCOUNTER — Ambulatory Visit: Payer: No Typology Code available for payment source

## 2023-06-19 DIAGNOSIS — G8929 Other chronic pain: Secondary | ICD-10-CM

## 2023-06-19 DIAGNOSIS — M5010 Cervical disc disorder with radiculopathy, unspecified cervical region: Secondary | ICD-10-CM

## 2023-06-19 DIAGNOSIS — M542 Cervicalgia: Secondary | ICD-10-CM

## 2023-06-19 DIAGNOSIS — M25511 Pain in right shoulder: Secondary | ICD-10-CM

## 2023-06-20 ENCOUNTER — Encounter: Payer: No Typology Code available for payment source | Admitting: Physical Therapy

## 2023-06-20 NOTE — Progress Notes (Signed)
Surgical Instructions    Your procedure is scheduled on Wednesday July 3rd.  Report to [facility] Main Entrance "A" at 9:30 A.M., then check in with the Admitting office.  Call this number if you have problems the morning of surgery:  [phone]   If you have any questions prior to your surgery date call [phone]: Open Monday-Friday 8am-4pm If you experience any cold or flu symptoms such as cough, fever, chills, shortness of breath, etc. between now and your scheduled surgery, please notify us at the above number     Remember:  Do not eat after midnight the night before your surgery  You may drink clear liquids until 8:30am the morning of your surgery.   Clear liquids allowed are: Water, Non-Citrus Juices (without pulp), Carbonated Beverages, Clear Tea, Black Coffee ONLY (NO MILK, CREAM OR POWDERED CREAMER of any kind), and Gatorade   Enhanced Recovery after Surgery for Orthopedics Enhanced Recovery after Surgery is a protocol used to improve the stress on your body and your recovery after surgery.  Patient Instructions  The day of surgery (if you do NOT have diabetes):  Drink ONE (1) Pre-Surgery Clear Ensure by __8:30___ am the morning of surgery   This drink was given to you during your hospital  pre-op appointment visit. Nothing else to drink after completing the  Pre-Surgery Clear Ensure.          If you have questions, please contact your surgeon's office.    Take these medicines the morning of surgery with A SIP OF WATER: amLODipine (NORVASC) 10 MG tablet  atorvastatin (LIPITOR) 10 MG tablet      IF NEEDED  albuterol (VENTOLIN HFA) 108 (90 Base) MCG/ACT inhaler , please bring with you to the hospital fluticasone (FLONASE) 50 MCG/ACT nasal spray  tiZANidine (ZANAFLEX) 4 MG tablet     As of today, STOP taking any Aspirin (unless otherwise instructed by your surgeon) Aleve, Naproxen, Ibuprofen, Motrin, Advil, Goody's, BC's, all herbal medications, fish oil,  and all vitamins.           Do not wear jewelry or makeup. Do not wear lotions, powders, perfumes or deodorant. Do not shave 48 hours prior to surgery.   Do not bring valuables to the hospital. Do not wear nail polish, gel polish, artificial nails, or any other type of covering on natural nails (fingers and toes) If you have artificial nails or gel coating that need to be removed by a nail salon, please have this removed prior to surgery. Artificial nails or gel coating may interfere with anesthesia's ability to adequately monitor your vital signs.  [facility] is not responsible for any belongings or valuables.    Do NOT Smoke (Tobacco/Vaping)  24 hours prior to your procedure  If you use a CPAP at night, you may bring your mask for your overnight stay.   Contacts, glasses, hearing aids, dentures or partials may not be worn into surgery, please bring cases for these belongings   For patients admitted to the hospital, discharge time will be determined by your treatment team.   Patients discharged the day of surgery will not be allowed to drive home, and someone needs to stay with them for 24 hours.   SURGICAL WAITING ROOM VISITATION Patients having surgery or a procedure may have no more than 2 support people in the waiting area - these visitors may rotate.   Children under the age of 31 must have an adult with them who is not the patient.  If the patient needs to stay at the hospital during part of their recovery, the visitor guidelines for inpatient rooms apply. Pre-op nurse will coordinate an appropriate time for 1 support person to accompany patient in pre-op.  This support person may not rotate.   Please refer to [email or web address] for the visitor guidelines for Inpatients (after your surgery is over and you are in a regular room).    Special instructions:    Oral Hygiene is also important to reduce your risk of infection.   Remember - BRUSH YOUR TEETH THE MORNING OF SURGERY WITH YOUR REGULAR TOOTHPASTE   [place]- Preparing For Surgery  Before surgery, you can play an important role. Because skin is not sterile, your skin needs to be as free of germs as possible. You can reduce the number of germs on your skin by washing with CHG (chlorahexidine gluconate) Soap before surgery.  CHG is an antiseptic cleaner which kills germs and bonds with the skin to continue killing germs even after washing.     Please do not use if you have an allergy to CHG or antibacterial soaps. If your skin becomes reddened/irritated stop using the CHG.  Do not shave (including legs and underarms) for at least 48 hours prior to first CHG shower. It is OK to shave your face.  Please follow these instructions carefully.     Shower the NIGHT BEFORE SURGERY and the MORNING OF SURGERY with CHG Soap.   If you chose to wash your hair, wash your hair first as usual with your normal shampoo. After you shampoo, rinse your hair and body thoroughly to remove the shampoo.  Then [employer] and genitals (private parts) with your normal soap and rinse thoroughly to remove soap.  After that Use CHG Soap as you would any other liquid soap. You can apply CHG directly to the skin and wash gently with a scrungie or a clean washcloth.   Apply the CHG Soap to your body ONLY FROM THE NECK DOWN.  Do not use on open wounds or open sores. Avoid contact with your eyes, ears, mouth and genitals (private parts). Wash Face and genitals (private parts)  with your normal soap.   Wash thoroughly, paying special attention to the area where your surgery will be performed.  Thoroughly rinse your body with warm water from the neck down.  DO NOT shower/wash with your normal soap after using and rinsing off the CHG Soap.  Pat yourself dry with a CLEAN TOWEL.  Wear CLEAN PAJAMAS to bed the night before surgery  Place CLEAN SHEETS on your bed the night before your  surgery  DO NOT SLEEP WITH PETS.   Day of Surgery:  Take a shower with CHG soap. Wear Clean/Comfortable clothing the morning of surgery Do not apply any deodorants/lotions.   Remember to brush your teeth WITH YOUR REGULAR TOOTHPASTE.    If you received a COVID test during your pre-op visit, it is requested that you wear a mask when out in public, stay away from anyone that may not be feeling well, and notify your surgeon if you develop symptoms. If you have been in contact with anyone that has tested positive in the last 10 days, please notify your surgeon.    Please read over the following fact sheets that you were given.

## 2023-06-20 NOTE — Procedures (Signed)
   [facility]  NCS (NERVE CONDUCTION STUDY) WITH EMG (ELECTROMYOGRAPHY) REPORT   STUDY DATE: 06/15/23 PATIENT NAME: Misty Bailey DOB: Sep 18, 1971 MRN: [id number]  ORDERING CLINICIAN: Joycelyn Schmid, MD   TECHNOLOGIST: Jenelle Mages ELECTROMYOGRAPHER: Glenford Bayley. Marcoantonio Legault, MD  CLINICAL INFORMATION: 52 year old female with right upper extremity pain.   FINDINGS: NERVE CONDUCTION STUDY:  Right median and right ulnar motor responses are normal.  Right ulnar F-wave latency is normal.  Right median, right ulnar, right radial sensory responses are normal.  Right median to ulnar transcarpal study is normal.   NEEDLE ELECTROMYOGRAPHY:  Needle examination of right upper extremity (deltoid, biceps, triceps, flexor carpi radialis, first dorsal interosseous) and right cervical paraspinal muscles is normal.    IMPRESSION:   Normal study.  No electrodiagnostic evidence of large fiber neuropathy or right cervical radiculopathy this time.   INTERPRETING PHYSICIAN:  Suanne Marker, MD Certified in Neurology, Neurophysiology and Neuroimaging  [facility] [address], [address] [place], [place] [address] [phone]  [facility]    Nerve / Sites Muscle Latency Ref. Amplitude Ref. Rel Amp Segments Distance Velocity Ref. Area    ms ms mV mV %  cm m/s m/s mVms  R Median - APB     Wrist APB 3.0 ?4.4 7.0 ?4.0 100 Wrist - APB 7   27.7     Upper arm APB 7.3  8.1  115 Upper arm - Wrist 26 60 ?49 31.7  R Ulnar - ADM     Wrist ADM 2.2 ?3.3 8.8 ?6.0 100 Wrist - ADM 7   30.1     B.Elbow ADM 4.2  7.3  82.6 B.Elbow - Wrist 13.6 68 ?49 24.0     A.Elbow ADM 6.4  9.4  129 A.Elbow - B.Elbow 14 65 ?49 31.0         SNC    Nerve / Sites Rec. Site Peak Lat Ref.  Amp Ref. Segments Distance Peak Diff Ref.    ms ms V V  cm ms ms  R Radial - Anatomical snuff box (Forearm)     Forearm Wrist 2.3 ?2.9 48 ?15 Forearm - Wrist 10    R Median, Ulnar - Transcarpal  comparison     Median Palm Wrist 1.9 ?2.2 68 ?35 Median Palm - Wrist 8       Ulnar Palm Wrist 1.7 ?2.2 25 ?12 Ulnar Palm - Wrist 8          Median Palm - Ulnar Palm  0.2 ?0.4  R Median - Orthodromic (Dig II, Mid palm)     Dig II Wrist 2.7 ?3.4 25 ?10 Dig II - Wrist 13    R Ulnar - Orthodromic, (Dig V, Mid palm)     Dig V Wrist 2.8 ?3.1 14 ?5 Dig V - Wrist 84               F  Wave    Nerve F Lat Ref.   ms ms  R Ulnar - ADM 23.9 ?32.0

## 2023-06-21 ENCOUNTER — Encounter (HOSPITAL_COMMUNITY)
Admission: RE | Admit: 2023-06-21 | Discharge: 2023-06-21 | Disposition: A | Discharge status: Home / Self Care | Payer: No Typology Code available for payment source | Source: Ambulatory Visit | Attending: Surgery | Admitting: Surgery

## 2023-06-21 ENCOUNTER — Ambulatory Visit
Payer: No Typology Code available for payment source | Attending: Critical Care Medicine | Admitting: Critical Care Medicine

## 2023-06-21 ENCOUNTER — Other Ambulatory Visit: Payer: Self-pay

## 2023-06-21 ENCOUNTER — Encounter: Payer: Self-pay | Admitting: Critical Care Medicine

## 2023-06-21 ENCOUNTER — Encounter (HOSPITAL_COMMUNITY): Payer: Self-pay

## 2023-06-21 VITALS — BP 132/82 | HR 75 | Temp 98.4°F | Ht 61.0 in | Wt 148.0 lb

## 2023-06-21 VITALS — BP 150/87 | HR 71 | Temp 98.6°F | Resp 16 | Ht 61.0 in | Wt 147.4 lb

## 2023-06-21 DIAGNOSIS — Z01818 Encounter for other preprocedural examination: Secondary | ICD-10-CM | POA: Diagnosis present

## 2023-06-21 DIAGNOSIS — I10 Essential (primary) hypertension: Secondary | ICD-10-CM | POA: Insufficient documentation

## 2023-06-21 DIAGNOSIS — G629 Polyneuropathy, unspecified: Secondary | ICD-10-CM | POA: Diagnosis not present

## 2023-06-21 HISTORY — DX: Unspecified asthma, uncomplicated: J45.909

## 2023-06-21 HISTORY — DX: Prediabetes: R73.03

## 2023-06-21 LAB — BASIC METABOLIC PANEL
Anion gap: 13 (ref 5–15)
BUN: 15 mg/dL (ref 6–20)
CO2: 24 mmol/L (ref 22–32)
Calcium: 9.7 mg/dL (ref 8.9–10.3)
Chloride: 103 mmol/L (ref 98–111)
Creatinine, Ser: 1.09 mg/dL — ABNORMAL HIGH (ref 0.44–1.00)
GFR, Estimated: >60 mL/min (ref >60)
Glucose, Bld: 101 mg/dL — ABNORMAL HIGH (ref 70–99)
Potassium: 3.6 mmol/L (ref 3.5–5.1)
Sodium: 140 mmol/L (ref 135–145)

## 2023-06-21 LAB — CBC
HCT: 42.6 % (ref 36.0–46.0)
Hemoglobin: 13.1 g/dL (ref 12.0–15.0)
MCH: 28.4 pg (ref 26.0–34.0)
MCHC: 30.8 g/dL (ref 30.0–36.0)
MCV: 92.2 fL (ref 80.0–100.0)
Platelets: 303 10*3/uL (ref 150–400)
RBC: 4.62 MIL/uL (ref 3.87–5.11)
RDW: 14.2 % (ref 11.5–15.5)
WBC: 6.5 10*3/uL (ref 4.0–10.5)
nRBC: 0 % (ref 0.0–0.2)

## 2023-06-21 MED ORDER — GABAPENTIN 100 MG PO CAPS: 300.0000 mg | ORAL_CAPSULE | Freq: Every evening | ORAL | 3 refills | Status: DC | PRN

## 2023-06-21 NOTE — Patient Instructions (Signed)
Increase gabapentin to 300mg  at bedtime Referral back to neurology made Blood flow normal in legs  Refills on all medications sent to pharmacy  Keep July appt with Dr Laural Benes

## 2023-06-21 NOTE — Progress Notes (Unsigned)
   Established Patient Office Visit  Subjective   Patient ID: Misty Bailey, female    DOB: 05/13/1971  Age: 52 y.o. MRN: [id number]  No chief complaint on file.   PCP Laural Benes seen in May Foot pain     {History (Optional):23778}  ROS    Objective:     LMP 03/01/2023  {Vitals History (Optional):23777}  Physical Exam   No results found for any visits on 06/21/23.  {Labs (Optional):23779}  The 10-year ASCVD risk score (Arnett DK, et al., 2019) is: 4.7%    Assessment & Plan:   Problem List Items Addressed This Visit   None   No follow-ups on file.    Shan Levans, MD

## 2023-06-21 NOTE — Progress Notes (Signed)
Nerve conduction study shows no bad nerve problem in the arm.

## 2023-06-21 NOTE — Progress Notes (Signed)
PCP - Jonah Blue Cardiologist - denies  PPM/ICD - denies Device Orders - n/a Rep Notified - n/a  Chest x-ray - denies EKG - 06/21/2023 Stress Test - denies ECHO - 10/15/2021 Cardiac Cath - denies  Sleep Study - denies CPAP - n/a  Fasting Blood Sugar - no DM Checks Blood Sugar _____ times a day  Last dose of GLP1 agonist-  n/a GLP1 instructions: n/a  Blood Thinner Instructions: n/a Aspirin Instructions: n/a   ERAS Protcol - yes PRE-SURGERY Ensure or G2-  Ensure by [id number]  COVID TEST- n/a   Anesthesia review: no  Patient denies shortness of breath, fever, cough and chest pain at PAT appointment   All instructions explained to the patient, with a verbal understanding of the material. Patient agrees to go over the instructions while at home for a better understanding. Patient also instructed to self quarantine after being tested for COVID-19. The opportunity to ask questions was provided.

## 2023-06-22 ENCOUNTER — Encounter: Payer: No Typology Code available for payment source | Admitting: Physical Therapy

## 2023-06-22 ENCOUNTER — Other Ambulatory Visit: Payer: Self-pay | Admitting: Critical Care Medicine

## 2023-06-22 DIAGNOSIS — M79671 Pain in right foot: Secondary | ICD-10-CM

## 2023-06-22 DIAGNOSIS — G629 Polyneuropathy, unspecified: Secondary | ICD-10-CM | POA: Insufficient documentation

## 2023-06-22 NOTE — Assessment & Plan Note (Addendum)
Clinical status consistent with neuropathy ABI normal no evidence of peripheral artery disease  Patient recommended to increase gabapentin to 300 mg at bedtime.  All markers for neuropathy from blood work from neurology were normal  Will confer with sports medicine  regarding further evaluation of the lower extremity condition may also need and benefit from podiatry

## 2023-06-22 NOTE — Progress Notes (Signed)
Amb ref sports med

## 2023-06-26 ENCOUNTER — Encounter: Payer: Self-pay | Admitting: Physical Therapy

## 2023-06-26 ENCOUNTER — Ambulatory Visit: Payer: No Typology Code available for payment source | Admitting: Physical Therapy

## 2023-06-26 DIAGNOSIS — M6281 Muscle weakness (generalized): Secondary | ICD-10-CM

## 2023-06-26 DIAGNOSIS — M25511 Pain in right shoulder: Secondary | ICD-10-CM

## 2023-06-26 NOTE — Therapy (Addendum)
OUTPATIENT PHYSICAL THERAPY SHOULDER TREATMENT PHYSICAL THERAPY DISCHARGE SUMMARY  Visits from Start of Care: 3  Current functional level related to goals / functional outcomes: Could not be reassessed - pt did not return to PT   Remaining deficits: Could not be reassessed - pt did not return to PT   Education / Equipment: Could not be reassessed - pt did not return to PT   Patient agrees to discharge. Patient goals were not met. Patient is being discharged due to not returning since the last visit.  10:00 AM, 09/07/23 Tereasa Coop, DPT Physical Therapy with     Patient Name: Misty Bailey MRN: [id number] DOB:1971-06-18, 52 y.o., female Today's Date: 06/26/2023  END OF SESSION:  PT End of Session - 06/26/23 1019     Visit Number 3    Number of Visits 16    Date for PT Re-Evaluation 08/28/23    Authorization Type aetna    PT Start Time 1019    PT Stop Time 1057    PT Time Calculation (min) 38 min    Activity Tolerance Patient limited by pain    Behavior During Therapy Anxious              Past Medical History:  Diagnosis Date   Anemia    Anxiety    Arthritis 02/23/2014   Rib Cage   Asthma    Depression    Diverticulosis    Dysmenorrhea 05/08/2018   Essential hypertension 07/29/2019   HLD (hyperlipidemia)    Panic attacks    Pre-diabetes    Suppurative hidradenitis    Past Surgical History:  Procedure Laterality Date   oral surgery     PELVIC LAPAROSCOPY  1998/1999 `   pelvic adhesions and pain   TOE SURGERY Left    great toe   WISDOM TOOTH EXTRACTION     Patient Active Problem List   Diagnosis Date Noted   Neuropathy 06/22/2023   Influenza vaccine refused 01/04/2021   Inguinal hernia of left side without obstruction or gangrene 04/30/2020   Hyperlipidemia 07/30/2019   Syncope 07/29/2019   Essential hypertension 07/29/2019   Dysmenorrhea 05/08/2018   Herpes 03/21/2014   Suppurative hidradenitis    Depression     PCP:  Marcine Matar, MD  REFERRING PROVIDER: Rodolph Bong, MD  REFERRING DIAG: [id number] (ICD-10-CM) - Chronic right shoulder pain  THERAPY DIAG:  Acute pain of right shoulder  Muscle weakness (generalized)  Rationale for Evaluation and Treatment: Rehabilitation  ONSET DATE: 01/2023  SUBJECTIVE:  SUBJECTIVE STATEMENT: 06/26/2023 States pain has been worse and ROM is getting worse. States she tries to move    Eval: States that she started having pain and numbness in her right fingertips in Feb. States that her ROM has decreased since February. States she has a lot of pain and feels like the muscle in the back of her arm Is aggravated where even just bushing her teeth bothers her. States that the shoulder pain comes and goes with movement. States it is getting worse and she can't take care herself. States she has difficulties going to the backroom because she cannot reach well.  Reports no MOI just started bothering her  States that she is not having the numbness she used to do. States she has tightness in her neck which is baseline for her now.   States that the shot and prednisone dose pack did not help and actually the shot made her pain worse.  Hand dominance: Right  PERTINENT HISTORY: HTN, pre diabetic, RA factor getting follow-up with rheumatology  PAIN:  Are you having pain? Yes: NPRS scale: 7/10 Pain location: shoulder and back of arm Pain description: tightness/dull achy, numbness, radiating up to neck Aggravating factors: moving, ADLs Relieving factors: rest, medication  PRECAUTIONS: None  WEIGHT BEARING RESTRICTIONS: No  FALLS:  Has patient fallen in last 6 months? 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   FINDINGS: There is no evidence of fracture or dislocation. There is no evidence of arthropathy or other focal bone abnormality. Soft tissues are unremarkable.   IMPRESSION: Negative.       Cervical Spine FINDINGS: Reversal of normal lordosis. No other malalignment. Multilevel degenerative disc disease most marked at C4-5, C5-6, and C6-7 with anterior osteophytes. Probable tiny posterior osteophytes at these levels. The pre odontoid space and prevertebral soft tissues are normal. Mild uncovertebral degenerative changes noted. No other abnormalities.   IMPRESSION: Degenerative changes as above. PATIENT SURVEYS:  FOTO 32% function  COGNITION: Overall cognitive status: Within functional limits for tasks assessed     SENSATION: Not tested  POSTURE: Sacral sitting, rounded shoulders, forward head.  Neck ROM tight on right with all motions and 50% limited with left ROT WNL other motion   UE Measurements - NT MMT due pain on right Upper Extremity Right EVAL Left EVAL   A/PROM MMT A/PROM MMT  Shoulder Flexion 30*  WFL 4+  Shoulder Extension      Shoulder Abduction 30*  WFL 4+  Shoulder Adduction      Shoulder Internal Rotation Reaches to right side hip*  Reaches to T8 SP 4  Shoulder External Rotation Reaches to side of right face *  Reaches to T2 SP  4-  Elbow Flexion      Elbow Extension      Wrist Flexion      Wrist Extension      Wrist Supination WNL*  WNL   Wrist Pronation WNL  WNL   Wrist Ulnar Deviation      Wrist Radial Deviation  Grip Strength NA  NA     (Blank rows = not tested)   *  pain ROM by visual estimation    JOINT MOBILITY TESTING:  Unable to test secondary to guarding and pain  PALPATION:  Tenderness throughout right pec , biceps, rotator cuff and triceps   TODAY'S TREATMENT:                                                                                                                                         DATE:  06/26/2023  Therapeutic Exercise:  Aerobic: Supine: tennis balls at base of neck 4 minutes, shoulder add iso 2 minutes R, PROM right shoulder all directions - tolerated poorly. shoulder IR iso hand on stomach - 5" holds 2 minute, cervical ROT 2 minutes    Prone:  Seated: inferior glide x15 5" holds R, scapular depression at table x20 5" holds B  Standing: Neuromuscular Re-education: Long exhale breathing  in supine position 5 minutes Manual Therapy: gentle traction to right arm - tolerated well Therapeutic Activity: Self Care:   Trigger Point Dry Needling:  Modalities: thermotherapy to right shoulder/neck in supine during supine exercises   PATIENT EDUCATION:  Education details: on HEP, on importance of breathing throughout session. Person educated: Patient Education method: Explanation, Demonstration, and Handouts Education comprehension: verbalized understanding   HOME EXERCISE PROGRAM: [id number]  ASSESSMENT:  CLINICAL IMPRESSION: 06/26/2023 Patient continues to have a lot of pain limiting participation in therapeutic interventions.  Limitations in all ranges of shoulder motion secondary to pain and muscle guarding.  Educated patient on pain management strategies as well as exercises to continue to perform to try to help with pain and maintain mobility.  Answered all questions and he continues to be tolerated the past.  Discussed outpatient to get similar benefits from hot pack at home with use of weight on top to mold to shoulder.  No increase in pain noted end of session but no decrease either.  Will continue with current plan of  care as tolerated.  Patient to take break from PT secondary to upcoming surgery we will follow-up in about a month's time.  Eval: Patient presents to physical therapy with severe right shoulder pain that limits patient's ability to perform daily dressing and bathing tasks as well as limits her ability to perform required tasks at work.  Patient with significant guarding and pain throughout right shoulder girdle demonstrating range of motion and strength deficits.  Educated patient current presentation which at this time presents similar to adhesive capsulitis.  Discussed and practiced breathing strategies to reduce constant tension in shoulders and body as well as improving down-regulation strategies for central nervous system.  Patient would greatly benefit from skilled physical therapy to improve overall functional range of motion, quality of life and help her return her to work safely.  OBJECTIVE IMPAIRMENTS: decreased activity tolerance, decreased mobility, decreased ROM, decreased strength, impaired  tone, impaired UE functional use, postural dysfunction, and pain.   ACTIVITY LIMITATIONS: carrying, lifting, sleeping, transfers, bed mobility, bathing, toileting, dressing, self feeding, and reach over head  PARTICIPATION LIMITATIONS: meal prep, cleaning, laundry, driving, shopping, and occupation  PERSONAL FACTORS: Fitness, Time since onset of injury/illness/exacerbation, and 1-2 comorbidities: pre diabetic, RA factor  are also affecting patient's functional outcome.   REHAB POTENTIAL: Good  CLINICAL DECISION MAKING: Stable/uncomplicated  EVALUATION COMPLEXITY: Low   GOALS: Goals reviewed with patient? yes  SHORT TERM GOALS: Target date: 07/17/2023  Patient will be independent in self management strategies to improve quality of life and functional outcomes. Baseline: New Program Goal status: INITIAL  2.  Patient will report at least 25 % improvement in overall symptoms and/or function to  demonstrate improved functional mobility Baseline: 0% better Goal status: INITIAL  3.  Patient will be able to demonstrate pain-free bed mobilities improved transitional movements Baseline: Painful Goal status: INITIAL  4.  Patient will demonstrate at least 90 degrees of active shoulder flexion and right upper extremity to improve ability to perform daily bathing and dressing tasks Baseline: Unable Goal status: INITIAL    LONG TERM GOALS: Target date: 08/28/2023   Patient will report at least 50% improvement in overall symptoms and/or function to demonstrate improved functional mobility Baseline: 0% better Goal status: INITIAL  2.  Patient will improve score on FOTO outcomes measure to projected score to demonstrate overall improved function and QOL Baseline: see above Goal status: INITIAL  3.  Patient will demonstrate at least 20 degrees of external rotation and right upper extremity to improve ability to use mouth throughout the day at work Baseline: Unable Goal status: INITIAL  4.  Patient will demonstrate at least 120 degrees of shoulder scaption to improve ability to reach up overhead and bathe and dress Baseline: Unable Goal status: INITIAL   PLAN:  PT FREQUENCY: 1-2x/week for total of 16 visits over 12 weeks certification.  PT DURATION: 12 weeks  PLANNED INTERVENTIONS: Therapeutic exercises, Therapeutic activity, Neuromuscular re-education, Balance training, Gait training, Patient/Family education, Self Care, Joint mobilization, Joint manipulation, Stair training, Vestibular training, Canalith repositioning, Orthotic/Fit training, Prosthetic training, DME instructions, Aquatic Therapy, Dry Needling, Electrical stimulation, Spinal manipulation, Spinal mobilization, Cryotherapy, Moist heat, Taping, Traction, Ultrasound, Ionotophoresis 4mg /ml Dexamethasone, Manual therapy, and Re-evaluation.   PLAN FOR NEXT SESSION: Pain management strategies, breathing techniques, range  of motion and manual interventions as tolerated, modalities as needed   12:17 PM, 06/26/23 Tereasa Coop, DPT Physical Therapy with [facility]

## 2023-06-26 NOTE — Progress Notes (Signed)
Cervical spine x-ray shows the potential for nerves to be pinched causing right arm pain but only mild.  We may want to consider an injection in your neck.  I can arrange that with the radiologist. Recommend return to clinic or I can order the injection.

## 2023-06-26 NOTE — Progress Notes (Signed)
Right shoulder MRI shows mild tendinitis and the potential for frozen shoulder.  Recommend return to clinic to go over the results of the shoulder and cervical spine MRI.

## 2023-06-27 ENCOUNTER — Encounter: Payer: Self-pay | Admitting: Family Medicine

## 2023-06-27 NOTE — H&P (Addendum)
PROVIDER: Wayne Both, MD  MRN: [id number] DOB: [birth year]  Subjective  Chief Complaint: RE-CHECK (gall stones/ hernia - c/o pain after eating fast foods off and on)   History of Present Illness: Misty Bailey is a 52 y.o. female who is seen today for a follow-up regarding her history of gallstones and a left inguinal hernia. I saw her in December 2023 and we plan to proceed with a laparoscopic cholecystectomy as well as an open inguinal hernia repair with mesh. She had just started a new job and could not afford the time off so the decision was made by her to cancel the surgery. She reports she is doing well and has only mild discomfort from the gallstones from occasional to occasion and then some discomfort from the hernia which is mild as well. She has had no other change in her medical care..    Review of Systems: A complete review of systems was obtained from the patient. I have reviewed this information and discussed as appropriate with the patient. See HPI as well for other ROS.  ROS  Medical History: Past Medical History: Diagnosis Date Anxiety Arthritis Asthma, unspecified asthma severity, unspecified whether complicated, unspecified whether persistent (HHS-HCC) Hyperlipidemia Hypertension  There is no problem list on file for this patient.  History reviewed. No pertinent surgical history.  Allergies Allergen Reactions Codeine Shortness Of Breath  Current Outpatient Medications on File Prior to Visit Medication Sig Dispense Refill amLODIPine (NORVASC) 5 MG tablet Take 5 mg by mouth once daily atorvastatin (LIPITOR) 10 MG tablet Take 1 tablet by mouth once daily  No current facility-administered medications on file prior to visit.  Family History Problem Relation Age of Onset Obesity Mother Hyperlipidemia (Elevated cholesterol) Mother Diabetes Mother Skin cancer Father Obesity Sister High blood pressure (Hypertension) Brother   Social  History  Tobacco Use Smoking Status Never Smokeless Tobacco Never   Social History  Socioeconomic History Marital status: Single Tobacco Use Smoking status: Never Smokeless tobacco: Never Substance and Sexual Activity Alcohol use: Yes Drug use: Yes Types: Marijuana  Social Determinants of Health  Financial Resource Strain: Low Risk (04/10/2023) Received from [facility] Overall Financial Resource Strain (CARDIA) Difficulty of Paying Living Expenses: Not very hard Food Insecurity: Food Insecurity Present (04/10/2023) Received from [facility] Hunger Vital Sign Worried About Running Out of Food in the Last Year: Sometimes true Ran Out of Food in the Last Year: Sometimes true Transportation Needs: No Transportation Needs (04/10/2023) Received from [facility] - Transportation Lack of Transportation (Medical): No Lack of Transportation (Non-Medical): No Physical Activity: Insufficiently Active (04/10/2023) Received from [facility] Exercise Vital Sign Days of Exercise per Week: 2 days Minutes of Exercise per Session: 30 min Stress: Stress Concern Present (04/10/2023) Received from [facility] - Occupational Stress Questionnaire Feeling of Stress : Very much Social Connections: Unknown (04/10/2023) Received from [facility] Social Connection and Isolation Panel [NHANES] Frequency of Communication with Friends and Family: More than three times a week Frequency of Social Gatherings with Friends and Family: Twice a week Attends Religious Services: More than 4 times per year Marital Status: Never married  Objective:  Vitals:  PainSc: 0-No pain  There is no height or weight on file to calculate BMI.  Physical Exam  She appears well on exam  Abdomen is soft and nontender.  There is no right upper quadrant tenderness or hepatomegaly  she again has an easily reducible left inguinal hernia  Labs, Imaging and  Diagnostic  Testing:  I have reviewed the notes in the electronic medical records  Assessment and Plan:  Diagnoses and all orders for this visit:  Symptomatic cholelithiasis  Left inguinal hernia    We again discussed her diagnoses as in December of last year. Again, she wants to proceed with an open left inguinal hernia pair with mesh and a laparoscopic cholecystectomy at the same time which I feel is very reasonable. We again discussed all the risk in detail including the diagnoses. Surgery will be scheduled

## 2023-06-27 NOTE — Progress Notes (Signed)
Spoke with the pt, she will arrive tom at 0800. NPO post midnight, to stop clear liquids at 0700.

## 2023-06-28 ENCOUNTER — Encounter (HOSPITAL_COMMUNITY)
Admission: AD | Disposition: A | Discharge status: Home / Self Care | Payer: Self-pay | Source: Home / Self Care | Attending: Surgery

## 2023-06-28 ENCOUNTER — Ambulatory Visit (HOSPITAL_COMMUNITY)
Admission: AD | Admit: 2023-06-28 | Discharge: 2023-06-28 | Disposition: A | Discharge status: Home / Self Care | Payer: No Typology Code available for payment source | Attending: Surgery | Admitting: Surgery

## 2023-06-28 ENCOUNTER — Ambulatory Visit (HOSPITAL_COMMUNITY): Payer: No Typology Code available for payment source | Admitting: Certified Registered"

## 2023-06-28 ENCOUNTER — Other Ambulatory Visit: Payer: Self-pay

## 2023-06-28 ENCOUNTER — Encounter (HOSPITAL_COMMUNITY): Payer: Self-pay | Admitting: Surgery

## 2023-06-28 ENCOUNTER — Telehealth: Payer: Self-pay

## 2023-06-28 DIAGNOSIS — Z79899 Other long term (current) drug therapy: Secondary | ICD-10-CM | POA: Diagnosis not present

## 2023-06-28 DIAGNOSIS — K8020 Calculus of gallbladder without cholecystitis without obstruction: Secondary | ICD-10-CM | POA: Diagnosis present

## 2023-06-28 DIAGNOSIS — K4090 Unilateral inguinal hernia, without obstruction or gangrene, not specified as recurrent: Secondary | ICD-10-CM | POA: Diagnosis not present

## 2023-06-28 DIAGNOSIS — I10 Essential (primary) hypertension: Secondary | ICD-10-CM | POA: Insufficient documentation

## 2023-06-28 DIAGNOSIS — K8010 Calculus of gallbladder with chronic cholecystitis without obstruction: Secondary | ICD-10-CM | POA: Diagnosis not present

## 2023-06-28 DIAGNOSIS — K8011 Calculus of gallbladder with chronic cholecystitis with obstruction: Secondary | ICD-10-CM | POA: Diagnosis not present

## 2023-06-28 DIAGNOSIS — Z87891 Personal history of nicotine dependence: Secondary | ICD-10-CM | POA: Insufficient documentation

## 2023-06-28 HISTORY — PX: CHOLECYSTECTOMY: SHX55

## 2023-06-28 HISTORY — PX: INGUINAL HERNIA REPAIR: SHX194

## 2023-06-28 LAB — NO BLOOD PRODUCTS

## 2023-06-28 LAB — POCT PREGNANCY, URINE: Preg Test, Ur: NEGATIVE

## 2023-06-28 SURGERY — LAPAROSCOPIC CHOLECYSTECTOMY
Anesthesia: General

## 2023-06-28 MED ORDER — ONDANSETRON HCL 4 MG/2ML IJ SOLN
INTRAMUSCULAR | Status: AC
  Filled 2023-06-28: qty 2

## 2023-06-28 MED ORDER — FENTANYL CITRATE (PF) 100 MCG/2ML IJ SOLN
INTRAMUSCULAR | Status: AC
  Filled 2023-06-28: qty 2

## 2023-06-28 MED ORDER — FENTANYL CITRATE (PF) 100 MCG/2ML IJ SOLN: 100.0000 ug | Freq: Once | INTRAMUSCULAR | Status: AC

## 2023-06-28 MED ORDER — LACTATED RINGERS IV SOLN: INTRAVENOUS | Status: DC

## 2023-06-28 MED ORDER — LIDOCAINE 2% (20 MG/ML) 5 ML SYRINGE
INTRAMUSCULAR | Status: DC | PRN
  Administered 2023-06-28: 60 mg via INTRAVENOUS

## 2023-06-28 MED ORDER — ENSURE PRE-SURGERY PO LIQD: 296.0000 mL | Freq: Once | ORAL | Status: DC

## 2023-06-28 MED ORDER — ROCURONIUM BROMIDE 100 MG/10ML IV SOLN
INTRAVENOUS | Status: DC | PRN
  Administered 2023-06-28: 50 mg via INTRAVENOUS

## 2023-06-28 MED ORDER — ESMOLOL HCL 100 MG/10ML IV SOLN
INTRAVENOUS | Status: DC | PRN
  Administered 2023-06-28 (×2): 10 mg via INTRAVENOUS

## 2023-06-28 MED ORDER — FENTANYL CITRATE (PF) 250 MCG/5ML IJ SOLN
INTRAMUSCULAR | Status: AC
  Filled 2023-06-28: qty 5

## 2023-06-28 MED ORDER — PROPOFOL 10 MG/ML IV BOLUS
INTRAVENOUS | Status: AC
  Filled 2023-06-28: qty 20

## 2023-06-28 MED ORDER — FENTANYL CITRATE (PF) 100 MCG/2ML IJ SOLN
INTRAMUSCULAR | Status: DC | PRN
  Administered 2023-06-28 (×2): 25 ug via INTRAVENOUS

## 2023-06-28 MED ORDER — ORAL CARE MOUTH RINSE: 15.0000 mL | Freq: Once | OROMUCOSAL | Status: AC

## 2023-06-28 MED ORDER — OXYCODONE HCL 5 MG/5ML PO SOLN
5.0000 mg | Freq: Once | ORAL | Status: AC | PRN
  Administered 2023-06-28: 5 mg via ORAL

## 2023-06-28 MED ORDER — BUPIVACAINE-EPINEPHRINE (PF) 0.5% -1:200000 IJ SOLN
INTRAMUSCULAR | Status: DC | PRN
  Administered 2023-06-28: 20 mL via PERINEURAL

## 2023-06-28 MED ORDER — FENTANYL CITRATE (PF) 100 MCG/2ML IJ SOLN
25.0000 ug | INTRAMUSCULAR | Status: DC | PRN
  Administered 2023-06-28 (×2): 50 ug via INTRAVENOUS

## 2023-06-28 MED ORDER — CHLORHEXIDINE GLUCONATE 0.12 % MT SOLN
15.0000 mL | Freq: Once | OROMUCOSAL | Status: AC
  Administered 2023-06-28: 15 mL via OROMUCOSAL
  Filled 2023-06-28: qty 15

## 2023-06-28 MED ORDER — AMISULPRIDE (ANTIEMETIC) 5 MG/2ML IV SOLN: 10.0000 mg | Freq: Once | INTRAVENOUS | Status: DC | PRN

## 2023-06-28 MED ORDER — PROPOFOL 10 MG/ML IV BOLUS
INTRAVENOUS | Status: DC | PRN
  Administered 2023-06-28: 40 mg via INTRAVENOUS
  Administered 2023-06-28: 120 mg via INTRAVENOUS

## 2023-06-28 MED ORDER — OXYCODONE HCL 5 MG/5ML PO SOLN
ORAL | Status: AC
  Filled 2023-06-28: qty 5

## 2023-06-28 MED ORDER — SUGAMMADEX SODIUM 200 MG/2ML IV SOLN
INTRAVENOUS | Status: DC | PRN
  Administered 2023-06-28: 50 mg via INTRAVENOUS
  Administered 2023-06-28: 75 mg via INTRAVENOUS

## 2023-06-28 MED ORDER — ACETAMINOPHEN 500 MG PO TABS
1000.0000 mg | ORAL_TABLET | ORAL | Status: AC
  Administered 2023-06-28: 1000 mg via ORAL
  Filled 2023-06-28: qty 2

## 2023-06-28 MED ORDER — ROCURONIUM BROMIDE 10 MG/ML (PF) SYRINGE
PREFILLED_SYRINGE | INTRAVENOUS | Status: AC
  Filled 2023-06-28: qty 10

## 2023-06-28 MED ORDER — MIDAZOLAM HCL 2 MG/2ML IJ SOLN
INTRAMUSCULAR | Status: AC
  Administered 2023-06-28: 2 mg via INTRAVENOUS
  Filled 2023-06-28: qty 2

## 2023-06-28 MED ORDER — KETOROLAC TROMETHAMINE 30 MG/ML IJ SOLN
INTRAMUSCULAR | Status: AC
  Filled 2023-06-28: qty 1

## 2023-06-28 MED ORDER — ESMOLOL HCL 100 MG/10ML IV SOLN
INTRAVENOUS | Status: AC
  Filled 2023-06-28: qty 10

## 2023-06-28 MED ORDER — OXYCODONE HCL 5 MG PO TABS: 5.0000 mg | ORAL_TABLET | Freq: Once | ORAL | Status: AC | PRN

## 2023-06-28 MED ORDER — DEXAMETHASONE SODIUM PHOSPHATE 10 MG/ML IJ SOLN
INTRAMUSCULAR | Status: DC | PRN
  Administered 2023-06-28: 8 mg via INTRAVENOUS

## 2023-06-28 MED ORDER — MIDAZOLAM HCL 2 MG/2ML IJ SOLN
INTRAMUSCULAR | Status: AC
  Filled 2023-06-28: qty 2

## 2023-06-28 MED ORDER — LIDOCAINE 2% (20 MG/ML) 5 ML SYRINGE
INTRAMUSCULAR | Status: AC
  Filled 2023-06-28: qty 5

## 2023-06-28 MED ORDER — CEFAZOLIN SODIUM-DEXTROSE 2-4 GM/100ML-% IV SOLN
2.0000 g | INTRAVENOUS | Status: AC
  Administered 2023-06-28: 2 g via INTRAVENOUS
  Filled 2023-06-28: qty 100

## 2023-06-28 MED ORDER — HYDROCODONE-ACETAMINOPHEN 5-325 MG PO TABS: 1.0000 | ORAL_TABLET | Freq: Four times a day (QID) | ORAL | 0 refills | Status: DC | PRN

## 2023-06-28 MED ORDER — BUPIVACAINE-EPINEPHRINE 0.25% -1:200000 IJ SOLN
INTRAMUSCULAR | Status: DC | PRN
  Administered 2023-06-28: 7 mL
  Administered 2023-06-28: 13 mL

## 2023-06-28 MED ORDER — CHLORHEXIDINE GLUCONATE CLOTH 2 % EX PADS: 6.0000 | MEDICATED_PAD | Freq: Once | CUTANEOUS | Status: DC

## 2023-06-28 MED ORDER — MIDAZOLAM HCL 2 MG/2ML IJ SOLN: 2.0000 mg | Freq: Once | INTRAMUSCULAR | Status: AC

## 2023-06-28 MED ORDER — BUPIVACAINE LIPOSOME 1.3 % IJ SUSP
INTRAMUSCULAR | Status: DC | PRN
  Administered 2023-06-28: 10 mL via PERINEURAL

## 2023-06-28 MED ORDER — DEXAMETHASONE SODIUM PHOSPHATE 10 MG/ML IJ SOLN
INTRAMUSCULAR | Status: AC
  Filled 2023-06-28: qty 1

## 2023-06-28 MED ORDER — ONDANSETRON HCL 4 MG/2ML IJ SOLN
INTRAMUSCULAR | Status: DC | PRN
  Administered 2023-06-28 (×2): 4 mg via INTRAVENOUS

## 2023-06-28 MED ORDER — FENTANYL CITRATE (PF) 100 MCG/2ML IJ SOLN
INTRAMUSCULAR | Status: AC
  Administered 2023-06-28: 100 ug via INTRAVENOUS
  Filled 2023-06-28: qty 2

## 2023-06-28 SURGICAL SUPPLY — 56 items
ADH SKN CLS APL DERMABOND .7 (GAUZE/BANDAGES/DRESSINGS) ×2
ADH SKN CLS LQ APL DERMABOND (GAUZE/BANDAGES/DRESSINGS) ×2
APL PRP STRL LF DISP 70% ISPRP (MISCELLANEOUS) ×2
APPLIER CLIP 5 13 M/L LIGAMAX5 (MISCELLANEOUS) ×2
APR CLP MED LRG 5 ANG JAW (MISCELLANEOUS) ×2
BAG COUNTER SPONGE SURGICOUNT (BAG) ×3 IMPLANT
BAG SPNG CNTER NS LX DISP (BAG) ×2
BLADE CLIPPER SURG (BLADE) IMPLANT
CANISTER SUCT 3000ML PPV (MISCELLANEOUS) ×3 IMPLANT
CHLORAPREP W/TINT 26 (MISCELLANEOUS) ×3 IMPLANT
CLIP APPLIE 5 13 M/L LIGAMAX5 (MISCELLANEOUS) ×3 IMPLANT
COVER SURGICAL LIGHT HANDLE (MISCELLANEOUS) ×3 IMPLANT
DERMABOND ADVANCED .7 DNX12 (GAUZE/BANDAGES/DRESSINGS) ×3 IMPLANT
DERMABOND ADVANCED .7 DNX6 (GAUZE/BANDAGES/DRESSINGS) IMPLANT
DRAIN PENROSE .5X12 LATEX STL (DRAIN) IMPLANT
DRAPE LAPAROTOMY TRNSV 102X78 (DRAPES) ×3 IMPLANT
ELECT REM PT RETURN 9FT ADLT (ELECTROSURGICAL) ×2
ELECTRODE REM PT RTRN 9FT ADLT (ELECTROSURGICAL) ×3 IMPLANT
GLOVE SURG SIGNA 7.5 PF LTX (GLOVE) ×3 IMPLANT
GOWN STRL REUS W/ TWL LRG LVL3 (GOWN DISPOSABLE) ×6 IMPLANT
GOWN STRL REUS W/ TWL XL LVL3 (GOWN DISPOSABLE) ×3 IMPLANT
GOWN STRL REUS W/TWL LRG LVL3 (GOWN DISPOSABLE) ×4
GOWN STRL REUS W/TWL XL LVL3 (GOWN DISPOSABLE) ×2
HEMOSTAT SNOW SURGICEL 2X4 (HEMOSTASIS) IMPLANT
IRRIG SUCT STRYKERFLOW 2 WTIP (MISCELLANEOUS) ×2
IRRIGATION SUCT STRKRFLW 2 WTP (MISCELLANEOUS) ×3 IMPLANT
KIT BASIN OR (CUSTOM PROCEDURE TRAY) ×3 IMPLANT
KIT TURNOVER KIT B (KITS) ×3 IMPLANT
MESH PARIETEX PROGRIP LEFT (Mesh General) IMPLANT
NDL HYPO 25GX1X1/2 BEV (NEEDLE) ×3 IMPLANT
NEEDLE HYPO 25GX1X1/2 BEV (NEEDLE) ×2 IMPLANT
NS IRRIG 1000ML POUR BTL (IV SOLUTION) ×3 IMPLANT
PACK GENERAL/GYN (CUSTOM PROCEDURE TRAY) ×3 IMPLANT
PAD ARMBOARD 7.5X6 YLW CONV (MISCELLANEOUS) ×3 IMPLANT
PENCIL SMOKE EVACUATOR (MISCELLANEOUS) ×3 IMPLANT
SCISSORS LAP 5X35 DISP (ENDOMECHANICALS) ×3 IMPLANT
SET TUBE SMOKE EVAC HIGH FLOW (TUBING) ×3 IMPLANT
SLEEVE Z-THREAD 5X100MM (TROCAR) ×6 IMPLANT
SPECIMEN JAR SMALL (MISCELLANEOUS) ×3 IMPLANT
SUT MNCRL AB 4-0 PS2 18 (SUTURE) ×3 IMPLANT
SUT MON AB 4-0 PC3 18 (SUTURE) ×3 IMPLANT
SUT SILK 2 0 SH (SUTURE) IMPLANT
SUT VIC AB 2-0 CT1 27 (SUTURE) ×4
SUT VIC AB 2-0 CT1 TAPERPNT 27 (SUTURE) ×3 IMPLANT
SUT VIC AB 3-0 CT1 27 (SUTURE) ×4
SUT VIC AB 3-0 CT1 TAPERPNT 27 (SUTURE) ×3 IMPLANT
SYR CONTROL 10ML LL (SYRINGE) ×3 IMPLANT
SYS BAG RETRIEVAL 10MM (BASKET) ×2
SYSTEM BAG RETRIEVAL 10MM (BASKET) ×3 IMPLANT
TOWEL GREEN STERILE (TOWEL DISPOSABLE) ×3 IMPLANT
TOWEL GREEN STERILE FF (TOWEL DISPOSABLE) ×3 IMPLANT
TRAY LAPAROSCOPIC MC (CUSTOM PROCEDURE TRAY) ×3 IMPLANT
TROCAR BALLN 12MMX100 BLUNT (TROCAR) ×3 IMPLANT
TROCAR Z-THREAD OPTICAL 5X100M (TROCAR) ×3 IMPLANT
WARMER LAPAROSCOPE (MISCELLANEOUS) ×3 IMPLANT
WATER STERILE IRR 1000ML POUR (IV SOLUTION) ×3 IMPLANT

## 2023-06-28 NOTE — Discharge Instructions (Signed)
[facility] _______[facility]  UMBILICAL OR INGUINAL HERNIA REPAIR: POST OP INSTRUCTIONS  Always review your discharge instruction sheet given to you by the facility where your surgery was performed. IF YOU HAVE DISABILITY OR FAMILY LEAVE FORMS, YOU MUST BRING THEM TO THE OFFICE FOR PROCESSING.   DO NOT GIVE THEM TO YOUR DOCTOR.  1. A  prescription for pain medication may be given to you upon discharge.  Take your pain medication as prescribed, if needed.  If narcotic pain medicine is not needed, then you may take acetaminophen (Tylenol) or ibuprofen (Advil) as needed. 2. Take your usually prescribed medications unless otherwise directed. If you need a refill on your pain medication, please contact your pharmacy.  They will contact our office to request authorization. Prescriptions will not be filled after 5 pm or on week-ends. 3. You should follow a light diet the first 24 hours after arrival home, such as soup and crackers, etc.  Be sure to include lots of fluids daily.  Resume your normal diet the day after surgery. 4.Most patients will experience some swelling and bruising around the umbilicus or in the groin and scrotum.  Ice packs and reclining will help.  Swelling and bruising can take several days to resolve.  6. It is common to experience some constipation if taking pain medication after surgery.  Increasing fluid intake and taking a stool softener (such as Colace) will usually help or prevent this problem from occurring.  A mild laxative (Milk of Magnesia or Miralax) should be taken according to package directions if there are no bowel movements after 48 hours. 7. Unless discharge instructions indicate otherwise, you may remove your bandages 24-48 hours after surgery, and you may shower at that time.  You may have steri-strips (small skin tapes) in place directly over the incision.  These strips should be left on the skin for 7-10 days.  If your surgeon used skin glue on the  incision, you may shower in 24 hours.  The glue will flake off over the next 2-3 weeks.  Any sutures or staples will be removed at the office during your follow-up visit. 8. ACTIVITIES:  You may resume regular (light) daily activities beginning the next day--such as daily self-care, walking, climbing stairs--gradually increasing activities as tolerated.  You may have sexual intercourse when it is comfortable.  Refrain from any heavy lifting or straining until approved by your doctor.  a.You may drive when you are no longer taking prescription pain medication, you can comfortably wear a seatbelt, and you can safely maneuver your car and apply brakes. b.RETURN TO WORK:   _____________________________________________  9.You should see your doctor in the office for a follow-up appointment approximately 2-3 weeks after your surgery.  Make sure that you call for this appointment within a day or two after you arrive home to insure a convenient appointment time. 10.OTHER INSTRUCTIONS: ___YOU MAY SHOWER STARTING TOMORROW ICE PACK, TYLENOL, AND IBUPROFEN ALSO FOR PAIN NO LIFTING MORE THAN 15 POUNDS FOR 4 WEEKS______________________    _____________________________________  WHEN TO CALL YOUR DOCTOR: Fever over 101.0 Inability to urinate Nausea and/or vomiting Extreme swelling or bruising Continued bleeding from incision. Increased pain, redness, or drainage from the incision  The clinic staff is available to answer your questions during regular business hours.  Please don't hesitate to call and ask to speak to one of the nurses for clinical concerns.  If you have a medical emergency, go to the nearest emergency room or call 911.  A surgeon from [facility] is always on call at the hospital   [facility] ______[facility] LAPAROSCOPIC SURGERY: POST OP INSTRUCTIONS Always review your discharge instruction sheet given to you by the facility where your surgery was performed. IF  YOU HAVE DISABILITY OR FAMILY LEAVE FORMS, YOU MUST BRING THEM TO THE OFFICE FOR PROCESSING.   DO NOT GIVE THEM TO YOUR DOCTOR.  A prescription for pain medication may be given to you upon discharge.  Take your pain medication as prescribed, if needed.  If narcotic pain medicine is not needed, then you may take acetaminophen (Tylenol) or ibuprofen (Advil) as needed. Take your usually prescribed medications unless otherwise directed. If you need a refill on your pain medication, please contact your pharmacy.  They will contact our office to request authorization. Prescriptions will not be filled after 5pm or on week-ends. You should follow a light diet the first few days after arrival home, such as soup and crackers, etc.  Be sure to include lots of fluids daily. Most patients will experience some swelling and bruising in the area of the incisions.  Ice packs will help.  Swelling and bruising can take several days to resolve.  It is common to experience some constipation if taking pain medication after surgery.  Increasing fluid intake and taking a stool softener (such as Colace) will usually help or prevent this problem from occurring.  A mild laxative (Milk of Magnesia or Miralax) should be taken according to package instructions if there are no bowel movements after 48 hours. Unless discharge instructions indicate otherwise, you may remove your bandages 24-48 hours after surgery, and you may shower at that time.  You may have steri-strips (small skin tapes) in place directly over the incision.  These strips should be left on the skin for 7-10 days.  If your surgeon used skin glue on the incision, you may shower in 24 hours.  The glue will flake off over the next 2-3 weeks.  Any sutures or staples will be removed at the office during your follow-up visit. ACTIVITIES:  You may resume regular (light) daily activities beginning the next day--such as daily self-care, walking, climbing stairs--gradually  increasing activities as tolerated.  You may have sexual intercourse when it is comfortable.  Refrain from any heavy lifting or straining until approved by your doctor. You may drive when you are no longer taking prescription pain medication, you can comfortably wear a seatbelt, and you can safely maneuver your car and apply brakes. RETURN TO WORK:  __________________________________________________________ Misty Bailey should see your doctor in the office for a follow-up appointment approximately 2-3 weeks after your surgery.  Make sure that you call for this appointment within a day or two after you arrive home to insure a convenient appointment time. OTHER INSTRUCTIONS: __________________________________________________________________________________________________________________________ __________________________________________________________________________________________________________________________ WHEN TO CALL YOUR DOCTOR: Fever over 101.0 Inability to urinate Continued bleeding from incision. Increased pain, redness, or drainage from the incision. Increasing abdominal pain  The clinic staff is available to answer your questions during regular business hours.  Please don't hesitate to call and ask to speak to one of the nurses for clinical concerns.  If you have a medical emergency, go to the nearest emergency room or call 911.  A surgeon from [facility] is always on call at the hospital. [address], [address], [place], [place]  [address] ? [address] [address], [place], [place]   [address] [phone] ? [phone] ? FAX [phone] Web site: [email or web address]

## 2023-06-28 NOTE — Interval H&P Note (Signed)
History and Physical Interval Note:no change in H and P  06/28/2023 8:11 AM  Misty Bailey  has presented today for surgery, with the diagnosis of SYMPTOMATIC GALLSTONES, LEFT INGUINAL HERNIA.  The various methods of treatment have been discussed with the patient and family. After consideration of risks, benefits and other options for treatment, the patient has consented to  Procedure(s) with comments: LAPAROSCOPIC CHOLECYSTECTOMY (N/A) - TAP BLOCK OPEN LEFT INGUINAL HERNIA REPAIR WITH MESH (Left) as a surgical intervention.  The patient's history has been reviewed, patient examined, no change in status, stable for surgery.  I have reviewed the patient's chart and labs.  Questions were answered to the patient's satisfaction.     Abigail Miyamoto

## 2023-06-28 NOTE — Anesthesia Postprocedure Evaluation (Signed)
Anesthesia Post Note  Patient: Misty Bailey  Procedure(s) Performed: LAPAROSCOPIC CHOLECYSTECTOMY OPEN LEFT INGUINAL HERNIA REPAIR WITH MESH (Left)     Patient location during evaluation: PACU Anesthesia Type: General Level of consciousness: awake and alert Pain management: pain level controlled Vital Signs Assessment: post-procedure vital signs reviewed and stable Respiratory status: spontaneous breathing, nonlabored ventilation and respiratory function stable Cardiovascular status: blood pressure returned to baseline Postop Assessment: no apparent nausea or vomiting Anesthetic complications: no   No notable events documented.  Last Vitals:  Vitals:   06/28/23 1130 06/28/23 1145  BP: (!) 145/70 (!) 149/72  Pulse: (!) 55 (!) 55  Resp: 11 13  Temp:  36.8 C  SpO2: 96% 95%    Last Pain:  Vitals:   06/28/23 1130  TempSrc:   PainSc: Asleep                 Shanda Howells

## 2023-06-28 NOTE — Op Note (Signed)
LAPAROSCOPIC CHOLECYSTECTOMY, OPEN LEFT INGUINAL HERNIA REPAIR WITH MESH  Procedure Note  Leighann L Polich 06/28/2023   Pre-op Diagnosis: SYMPTOMATIC GALLSTONES, LEFT INGUINAL HERNIA     Post-op Diagnosis: same  Procedure(s): LAPAROSCOPIC CHOLECYSTECTOMY OPEN LEFT INGUINAL HERNIA REPAIR WITH MESH  Surgeon(s): Abigail Miyamoto, MD  Anesthesia: General  Staff:  Circulator: Lennox Laity, RN Scrub Person: Tawny Hopping, RN Float Surgical Tech: Antony Contras E  Estimated Blood Loss: Minimal               Specimens: sent to path  Indications: This is a 52 year old female with symptomatic gallstones as well as a symptomatic left inguinal hernia.  The decision was made to proceed to the operating room for the left inguinal hernia pair with mesh followed by laparoscopic cholecystectomy  Findings: The patient was found to have an indirect left inguinal hernia repair with a piece of large piece of Prolene ProGrip mesh from [employer] She was found to have a chronically inflamed gallbladder with gallstones also.  Procedure: The patient was brought to the operating identified as correct patient.  She was placed upon the operating table and general anesthesia was induced.  Her abdomen was then prepped and draped in the usual sterile fashion.  I anesthetized the skin in the left inguinal area with Marcaine and then made a longitudinal incision with scalpel.  I then dissected down through Scarpa's fascia with electrocautery.  I then opened up the external bleak fascia toward the pubis and the internal ring.  I identified a hernia sac coming from the round ligament.  All contents had been reduced.  I tied off the base of the sac with a 2-0 silk suture and excised the redundant sac with the cautery.  I then reinforced the internal ring with a silk suture as well.  Next a piece of large Prolene ProGrip mesh was brought to the field.  I placed it along the inguinal floor.  I then sutured the mesh  with a 2-0 Vicryl suture to the pubic tubercle and the shelving edge of the inguinal ligament.  Wide coverage of the inguinal floor appeared to be achieved.  This also widely cover the indirect hernia and hernia sac.  I then closed the external oblique fascia over the top of the mesh with a running 2-0 Vicryl suture.  Scarpa's fascia was closed with interrupted 3-0 Vicryl sutures and the skin was closed with running 4-0 Monocryl  I then made a vertical incision below the umbilicus with a scalpel.  I took this down to the fascia which was then opened the scalpel as well.  Hemostat was then used to gain entrance into the peritoneal cavity under direct vision.  A 0 Vicryl pursing suture was placed around the fascial opening.  The [facility] port was placed at the opening and insufflation of the abdomen was begun.  I placed a 5 mm trocar in the patient's epigastrium and 2 more in the right upper quadrant all under direct vision.  The gallbladder was found to be thick-walled and chronically inflamed and contracted.  I was able to dissect out the base of the gallbladder and achieved a critical window around the cystic duct.  The cystic duct was then clipped 3 times proximally and once distally and transected.  The cystic artery was then identified clipped proximally and distally and transected as well.  The gallbladder was then slowly dissected free from the liver bed with electrocautery.  Once it was freed from the liver bed,  hemostasis was achieved with the cautery and the single piece of surgical segment.  We then placed the gallbladder in an Endosac and removed it through the incision at the umbilicus.  We then closed with 0 Vicryl at the umbilicus in place closing the fascial defect.  We then irrigated the right upper quadrant under direct vision and again hemostasis appeared to be achieved.  The remaining ports removed under direct vision and the abdomen was deflated.  All incisions were anesthetized with Marcaine and  closed with 4-0 Monocryl sutures.  Dermabond was then applied to all incisions.  The patient tolerated the procedure well.  All the counts were correct at the end of the procedure.  The patient was then extubated in the operating room and taken in a stable condition to the recovery room.            Abigail Miyamoto   Date: 06/28/2023  Time: 10:48 AM

## 2023-06-28 NOTE — Transfer of Care (Signed)
Immediate Anesthesia Transfer of Care Note  Patient: Misty Bailey  Procedure(s) Performed: LAPAROSCOPIC CHOLECYSTECTOMY OPEN LEFT INGUINAL HERNIA REPAIR WITH MESH (Left)  Patient Location: PACU  Anesthesia Type:General  Level of Consciousness: awake, alert , and oriented  Airway & Oxygen Therapy: Patient Spontanous Breathing and Patient connected to nasal cannula oxygen  Post-op Assessment: Report given to RN and Post -op Vital signs reviewed and stable  Post vital signs: Reviewed and stable  Last Vitals:  Vitals Value Taken Time  BP 147/75 06/28/23 1100  Temp 36.8 C 06/28/23 1100  Pulse 71 06/28/23 1101  Resp 20 06/28/23 1101  SpO2 96 % 06/28/23 1101  Vitals shown include unvalidated device data.  Last Pain:  Vitals:   06/28/23 0837  TempSrc:   PainSc: 8       Patients Stated Pain Goal: 0 (06/28/23 0837)  Complications: No notable events documented.

## 2023-06-28 NOTE — Telephone Encounter (Signed)
FMLA form received 06/28/23  Form due by - not listed

## 2023-06-28 NOTE — Anesthesia Procedure Notes (Signed)
Anesthesia Regional Block: TAP block   Pre-Anesthetic Checklist: , timeout performed,  Correct Patient, Correct Site, Correct Laterality,  Correct Procedure, Correct Position, site marked,  Risks and benefits discussed,  Pre-op evaluation,  At surgeon's request and post-op pain management  Laterality: Left  Prep: Maximum Sterile Barrier Precautions used, chloraprep       Needles:  Injection technique: Single-shot  Needle Type: Echogenic Stimulator Needle     Needle Length: 9cm  Needle Gauge: 21     Additional Needles:   Narrative:  Start time: 06/28/2023 9:25 AM End time: 06/28/2023 9:28 AM Injection made incrementally with aspirations every 5 mL. Anesthesiologist: Kaylyn Layer, MD  Additional Notes: Risks, benefits, and alternative discussed. Patient gave consent for procedure. Patient prepped and draped in sterile fashion. Sedation administered, patient remains easily responsive to voice. Relevant anatomy identified with ultrasound guidance. Local anesthetic given in 5cc increments with no signs or symptoms of intravascular injection. No pain or paraesthesias with injection. Patient monitored throughout procedure with signs of LAST or immediate complications. Tolerated well. Ultrasound image placed in chart. Amalia Greenhouse, MD

## 2023-06-28 NOTE — Anesthesia Procedure Notes (Signed)
Procedure Name: Intubation Date/Time: 06/28/2023 9:49 AM  Performed by: Marny Lowenstein, CRNAPre-anesthesia Checklist: Patient identified, Emergency Drugs available, Suction available and Patient being monitored Patient Re-evaluated:Patient Re-evaluated prior to induction Oxygen Delivery Method: Circle system utilized Preoxygenation: Pre-oxygenation with 100% oxygen Induction Type: IV induction Ventilation: Mask ventilation without difficulty Laryngoscope Size: Miller and 2 Grade View: Grade I Tube type: Oral Tube size: 6.5 mm Number of attempts: 2 Airway Equipment and Method: Stylet Placement Confirmation: ETT inserted through vocal cords under direct vision, positive ETCO2 and breath sounds checked- equal and bilateral Secured at: 21 cm Tube secured with: Tape Dental Injury: Teeth and Oropharynx as per pre-operative assessment

## 2023-06-28 NOTE — Telephone Encounter (Signed)
Form completed.   Form reviewed and signed by Dr. [name].

## 2023-06-28 NOTE — Anesthesia Preprocedure Evaluation (Addendum)
Anesthesia Evaluation  Patient identified by MRN, date of birth, ID band Patient awake    Reviewed: Allergy & Precautions, NPO status , Patient's Chart, lab work & pertinent test results  History of Anesthesia Complications Negative for: history of anesthetic complications  Airway Mallampati: II  TM Distance: >3 FB Neck ROM: Full    Dental no notable dental hx.    Pulmonary asthma , former smoker   Pulmonary exam normal        Cardiovascular hypertension, Pt. on medications Normal cardiovascular exam     Neuro/Psych   Anxiety Depression    negative neurological ROS     GI/Hepatic ,,,(+)     substance abuse  marijuana useSYMPTOMATIC GALLSTONES, LEFT INGUINAL HERNIA   Endo/Other  negative endocrine ROS    Renal/GU negative Renal ROS     Musculoskeletal  (+) Arthritis ,    Abdominal   Peds  Hematology  (+) REFUSES BLOOD PRODUCTS  Anesthesia Other Findings Day of surgery medications reviewed with patient.  Reproductive/Obstetrics                             Anesthesia Physical Anesthesia Plan  ASA: 2  Anesthesia Plan: General   Post-op Pain Management: Tylenol PO (pre-op)*   Induction: Intravenous  PONV Risk Score and Plan: 3 and Treatment may vary due to age or medical condition, Ondansetron, Dexamethasone and Midazolam  Airway Management Planned: Oral ETT  Additional Equipment: None  Intra-op Plan:   Post-operative Plan: Extubation in OR  Informed Consent: I have reviewed the patients History and Physical, chart, labs and discussed the procedure including the risks, benefits and alternatives for the proposed anesthesia with the patient or authorized representative who has indicated his/her understanding and acceptance.     Dental advisory given  Plan Discussed with: CRNA  Anesthesia Plan Comments:        Anesthesia Quick Evaluation

## 2023-06-29 ENCOUNTER — Encounter (HOSPITAL_COMMUNITY): Payer: Self-pay | Admitting: Surgery

## 2023-06-30 ENCOUNTER — Ambulatory Visit: Payer: Self-pay | Admitting: Internal Medicine

## 2023-06-30 LAB — SURGICAL PATHOLOGY

## 2023-07-03 NOTE — Telephone Encounter (Signed)
Form placed up front on 06/28/23 for scanning/faxing.  

## 2023-07-03 NOTE — Telephone Encounter (Signed)
Faxed successfully 07/03/2023. 

## 2023-07-10 ENCOUNTER — Encounter: Payer: Self-pay | Admitting: Internal Medicine

## 2023-07-10 ENCOUNTER — Ambulatory Visit: Payer: No Typology Code available for payment source | Admitting: Family Medicine

## 2023-07-10 ENCOUNTER — Ambulatory Visit: Payer: No Typology Code available for payment source | Attending: Internal Medicine | Admitting: Internal Medicine

## 2023-07-10 VITALS — BP 138/80 | HR 80 | Temp 97.9°F | Ht 61.0 in | Wt 149.0 lb

## 2023-07-10 VITALS — BP 142/90 | HR 77 | Ht 61.0 in | Wt 147.0 lb

## 2023-07-10 DIAGNOSIS — I10 Essential (primary) hypertension: Secondary | ICD-10-CM

## 2023-07-10 DIAGNOSIS — R7303 Prediabetes: Secondary | ICD-10-CM | POA: Diagnosis not present

## 2023-07-10 DIAGNOSIS — E782 Mixed hyperlipidemia: Secondary | ICD-10-CM | POA: Diagnosis not present

## 2023-07-10 DIAGNOSIS — G609 Hereditary and idiopathic neuropathy, unspecified: Secondary | ICD-10-CM

## 2023-07-10 DIAGNOSIS — G8929 Other chronic pain: Secondary | ICD-10-CM | POA: Diagnosis not present

## 2023-07-10 DIAGNOSIS — M25511 Pain in right shoulder: Secondary | ICD-10-CM | POA: Diagnosis not present

## 2023-07-10 DIAGNOSIS — Z23 Encounter for immunization: Secondary | ICD-10-CM

## 2023-07-10 MED ORDER — ZOSTER VAC RECOMB ADJUVANTED 50 MCG/0.5ML IM SUSR
0.5000 mL | Freq: Once | INTRAMUSCULAR | 0 refills | Status: AC
Start: 2023-07-10 — End: 2023-07-10

## 2023-07-10 MED ORDER — LOSARTAN POTASSIUM 25 MG PO TABS: 25.0000 mg | ORAL_TABLET | Freq: Every day | ORAL | 1 refills | Status: DC

## 2023-07-10 MED ORDER — ATORVASTATIN CALCIUM 10 MG PO TABS: 10.0000 mg | ORAL_TABLET | Freq: Every day | ORAL | 1 refills | Status: DC

## 2023-07-10 NOTE — Patient Instructions (Signed)
Thank you for coming in today.   You should hear from [facility] soon.   Let me know if you have a problem but I think this is frozen shoulder.

## 2023-07-10 NOTE — Progress Notes (Signed)
Patient ID: Misty Bailey, female    DOB: 12-Jan-1971  MRN: [id number]  CC: Hypertension (HTN f/u. Med refill. /No questions / concerns.)   Subjective: Misty Bailey is a 52 y.o. female who presents for chronic ds management Her concerns today include:  Dysmenorrhea, uterine fibroid,  diverticulosis, HL, HTN, hidradenitis, bronchospasm.    Had GB and LT inguinal hernia removed and repaired 06/28/2023.  Has f/u with surgeon next wk  HTN:  on Norvasc 10 mg and Cozaar 25 mg.  Has not taken meds as yet for today but usually takes with BF.  Has a wrist device but has not been checking blood pressure recently.  She limits salt in the foods.  HL:  on Lipitor 10 mg.  Taking and tolerating the medication.  Reports intermittent tingling and burning in the feet with aching in the legs.  This started 2 weeks prior to her having her surgery the early part of this month.  More noticeable when she is up and ambulating.  Does not occur every day.  I note that A1c 1 month ago done by Dr. Marjory Lies was 6.4.  She had seen Dr. Delford Field for the same the end of last month.  He increase gabapentin to 300 mg at bedtime.  She thinks it has helped some.  HM: Wants to know whether she should get the shingles vaccine.  She has had shingles before. Patient Active Problem List   Diagnosis Date Noted   Neuropathy 06/22/2023   Influenza vaccine refused 01/04/2021   Inguinal hernia of left side without obstruction or gangrene 04/30/2020   Hyperlipidemia 07/30/2019   Syncope 07/29/2019   Essential hypertension 07/29/2019   Dysmenorrhea 05/08/2018   Herpes 03/21/2014   Suppurative hidradenitis    Depression      Current Outpatient Medications on File Prior to Visit  Medication Sig Dispense Refill   albuterol (VENTOLIN HFA) 108 (90 Base) MCG/ACT inhaler Inhale 2 puffs into the lungs every 6 (six) hours as needed for wheezing or shortness of breath. 8 g 4   amLODipine (NORVASC) 10 MG tablet Take 1 tablet (10 mg  total) by mouth daily. 90 tablet 1   atorvastatin (LIPITOR) 10 MG tablet Take 1 tablet (10 mg total) by mouth daily. 30 tablet 4   desonide (DESOWEN) 0.05 % ointment Apply 1 Application topically daily as needed (rash).     fluticasone (FLONASE) 50 MCG/ACT nasal spray Place 1 spray into both nostrils daily. (Patient taking differently: Place 1 spray into both nostrils daily as needed for allergies.) 16 g 1   gabapentin (NEURONTIN) 100 MG capsule Take 3 capsules (300 mg total) by mouth at bedtime as needed. 180 capsule 3   HYDROcodone-acetaminophen (NORCO/VICODIN) 5-325 MG tablet Take 1 tablet by mouth every 6 (six) hours as needed for moderate pain or severe pain. 25 tablet 0   losartan (COZAAR) 25 MG tablet Take 1 tablet (25 mg total) by mouth daily. 90 tablet 1   tiZANidine (ZANAFLEX) 4 MG tablet Take 1 tablet (4 mg total) by mouth every 8 (eight) hours as needed for muscle spasms. 30 tablet 1   No current facility-administered medications on file prior to visit.    Allergies  Allergen Reactions   Codeine Shortness Of Breath    Social History   Socioeconomic History   Marital status: Single    Spouse name: Not on file   Number of children: 0   Years of education: Associates   Highest education level: Associate degree:  occupational, Scientist, product/process development, or vocational program  Occupational History   Not on file  Tobacco Use   Smoking status: Former    Current packs/day: 0.00    Types: Cigarettes    Quit date: 1996    Years since quitting: 28.5   Smokeless tobacco: Never   Tobacco comments:    Weed  Vaping Use   Vaping status: Never Used  Substance and Sexual Activity   Alcohol use: Yes    Comment: social   Drug use: Yes    Frequency: 3.0 times per week    Types: Marijuana   Sexual activity: Not Currently    Partners: Male    Birth control/protection: None  Other Topics Concern   Not on file  Social History Narrative   Boyfriend incarcerated 2014-2018   Social Determinants of  Health   Financial Resource Strain: Low Risk  (04/10/2023)   Overall Financial Resource Strain (CARDIA)    Difficulty of Paying Living Expenses: Not very hard  Food Insecurity: Food Insecurity Present (04/10/2023)   Hunger Vital Sign    Worried About Running Out of Food in the Last Year: Sometimes true    Ran Out of Food in the Last Year: Sometimes true  Transportation Needs: No Transportation Needs (04/10/2023)   PRAPARE - Administrator, Civil Service (Medical): No    Lack of Transportation (Non-Medical): No  Physical Activity: Insufficiently Active (04/10/2023)   Exercise Vital Sign    Days of Exercise per Week: 2 days    Minutes of Exercise per Session: 30 min  Stress: Stress Concern Present (04/10/2023)   [employer] of Occupational Health - Occupational Stress Questionnaire    Feeling of Stress : Very much  Social Connections: Unknown (04/10/2023)   Social Connection and Isolation Panel [NHANES]    Frequency of Communication with Friends and Family: More than three times a week    Frequency of Social Gatherings with Friends and Family: Twice a week    Attends Religious Services: More than 4 times per year    Active Member of [employer] or Organizations: Not on file    Attends Banker Meetings: Not on file    Marital Status: Never married  Intimate Partner Violence: Unknown (09/05/2022)   Received from [facility]   HITS    Physically Hurt: Not on file    Insult or Talk Down To: Not on file    Threaten Physical Harm: Not on file    Scream or Curse: Not on file    Family History  Problem Relation Age of Onset   Diabetes Mother    Hypertension Mother    Hyperlipidemia Mother    Heart murmur Mother    Crohn's disease Mother    Hypertension Father    Stroke Father    Hyperlipidemia Father    Diabetes Sister    Hypertension Sister    Hyperlipidemia Sister    Hypertension Maternal Grandmother    Diabetes Maternal Grandmother    Osteoporosis  Maternal Grandmother    Cirrhosis Maternal Grandfather    Thyroid disease Paternal Aunt    Breast cancer Neg Hx     Past Surgical History:  Procedure Laterality Date   CHOLECYSTECTOMY N/A 06/28/2023   Procedure: LAPAROSCOPIC CHOLECYSTECTOMY;  Surgeon: Abigail Miyamoto, MD;  Location: [facility] OR;  Service: General;  Laterality: N/A;  TAP BLOCK   INGUINAL HERNIA REPAIR Left 06/28/2023   Procedure: OPEN LEFT INGUINAL HERNIA REPAIR WITH MESH;  Surgeon: Abigail Miyamoto, MD;  Location:  [facility] OR;  Service: General;  Laterality: Left;   oral surgery     PELVIC LAPAROSCOPY  1998/1999 `   pelvic adhesions and pain   TOE SURGERY Left    great toe   WISDOM TOOTH EXTRACTION      ROS: Review of Systems Negative except as stated above  PHYSICAL EXAM: BP 138/80 (BP Location: Left Arm, Patient Position: Sitting, Cuff Size: Normal)   Pulse 80   Temp 97.9 F (36.6 C) (Oral)   Ht 5\' 1"  (1.549 m)   Wt 149 lb (67.6 kg)   SpO2 100%   BMI 28.15 kg/m   Physical Exam Repeat BP 140/76 General appearance - alert, well appearing, and in no distress Mental status - normal mood, behavior, speech, dress, motor activity, and thought processes Chest - clear to auscultation, no wheezes, rales or rhonchi, symmetric air entry Heart - normal rate, regular rhythm, normal S1, S2, no murmurs, rubs, clicks or gallops Abdomen: Normal bowel sounds.  Surgical incisions from laparoscopic surgery in the abdomen appears to be healing well with no signs of wound breakdown.  Surgical bone in the left inguinal area is without erythema or drainage. Neurological -power in the lower extremities 5/5 bilaterally.  Gross sensation intact in both lower extremities.   Ankle jerk and knee jerk reflexes normal bilaterally.  Leap exam normal. Extremities - peripheral pulses normal, no pedal edema, no clubbing or cyanosis      Latest Ref Rng & Units 06/21/2023    2:00 PM 05/11/2023   12:09 PM 04/11/2023    9:14 AM  CMP  Glucose 70 - 99  mg/dL 409  811  914   BUN 6 - 20 mg/dL 15  17  12    Creatinine 0.44 - 1.00 mg/dL 7.82  9.56  2.13   Sodium 135 - 145 mmol/L 140  143  141   Potassium 3.5 - 5.1 mmol/L 3.6  4.2  4.3   Chloride 98 - 111 mmol/L 103  108  107   CO2 22 - 32 mmol/L 24  21  20    Calcium 8.9 - 10.3 mg/dL 9.7  9.4  9.1    Lipid Panel     Component Value Date/Time   CHOL 213 (H) 02/03/2021 1005   TRIG 78 02/03/2021 1005   HDL 56 02/03/2021 1005   CHOLHDL 3.8 02/03/2021 1005   LDLCALC 143 (H) 02/03/2021 1005    CBC    Component Value Date/Time   WBC 6.5 06/21/2023 1400   RBC 4.62 06/21/2023 1400   HGB 13.1 06/21/2023 1400   HGB 13.6 08/17/2021 1037   HCT 42.6 06/21/2023 1400   HCT 42.3 08/17/2021 1037   PLT 303 06/21/2023 1400   PLT 312 08/17/2021 1037   MCV 92.2 06/21/2023 1400   MCV 89 08/17/2021 1037   MCH 28.4 06/21/2023 1400   MCHC 30.8 06/21/2023 1400   RDW 14.2 06/21/2023 1400   RDW 12.6 08/17/2021 1037   LYMPHSABS 2.7 01/25/2018 1106   MONOABS 0.5 08/09/2013 1045   EOSABS 0.2 01/25/2018 1106   BASOSABS 0.0 01/25/2018 1106    ASSESSMENT AND PLAN: 1. Essential hypertension Not at goal but patient has not taken medicines as yet for today.  She will take them as soon as she returns home.  Continue Norvasc and Cozaar. - losartan (COZAAR) 25 MG tablet; Take 1 tablet (25 mg total) by mouth daily.  Dispense: 90 tablet; Refill: 1  2. Mixed hyperlipidemia - atorvastatin (LIPITOR) 10 MG tablet; Take  1 tablet (10 mg total) by mouth daily.  Dispense: 90 tablet; Refill: 1  3. Prediabetes Discussed the importance of healthy eating habits and regular exercise to help prevent progression to full diabetes. Patient advised to eliminate sugary drinks from the diet, cut back on portion sizes especially of white carbohydrates, eat more white lean meat like chicken [place] and seafood instead of beef or pork and incorporate fresh fruits and vegetables into the diet daily. -Recommend that she starts a  walking routine once she is all healed from her recent surgery.  4. Idiopathic peripheral neuropathy Ddx include small fiber neuropathy associated with preDM vs vit B12 def. I recommend referral back to Dr. Rinaldo Cloud mildly to get nerve conduction study done on the lower extremities but patient wants to hold off on that for now.  States that she will try to make dietary changes to prevent progression to diabetes. - Vitamin B12 - Folate  5. Need for shingles vaccine Due for 1 is agreeable to shingles vaccine.  I printed prescription and give it to her to take to her pharmacy to get the vaccine in about 1 to 2 weeks after she is healed from her surgery. - Zoster Vaccine Adjuvanted [facility]) injection; Inject 0.5 mLs into the muscle once for 1 dose.  Dispense: 0.5 mL; Refill: 0    Patient was given the opportunity to ask questions.  Patient verbalized understanding of the plan and was able to repeat key elements of the plan.   This documentation was completed using Paediatric nurse.  Any transcriptional errors are unintentional.  No orders of the defined types were placed in this encounter.    Requested Prescriptions   Pending Prescriptions Disp Refills   losartan (COZAAR) 25 MG tablet 90 tablet 1    Sig: Take 1 tablet (25 mg total) by mouth daily.    No follow-ups on file.  Jonah Blue, MD, FACP

## 2023-07-10 NOTE — Patient Instructions (Addendum)
Prediabetes Eating Plan Prediabetes is a condition that causes blood sugar (glucose) levels to be higher than normal. This increases the risk for developing type 2 diabetes (type 2 diabetes mellitus). Working with a health care provider or nutrition specialist (dietitian) to make diet and lifestyle changes can help prevent the onset of diabetes. These changes may help you: Control your blood glucose levels. Improve your cholesterol levels. Manage your blood pressure. What are tips for following this plan? Reading food labels Read food labels to check the amount of fat, salt (sodium), and sugar in prepackaged foods. Avoid foods that have: Saturated fats. Trans fats. Added sugars. Avoid foods that have more than 300 milligrams (mg) of sodium per serving. Limit your sodium intake to less than 2,300 mg each day. Shopping Avoid buying pre-made and processed foods. Avoid buying drinks with added sugar. Cooking Cook with olive oil. Do not use butter, lard, or ghee. Bake, broil, grill, steam, or boil foods. Avoid frying. Meal planning  Work with your dietitian to create an eating plan that is right for you. This may include tracking how many calories you take in each day. Use a food diary, notebook, or mobile application to track what you eat at each meal. Consider following a Mediterranean diet. This includes: Eating several servings of fresh fruits and vegetables each day. Eating fish at least twice a week. Eating one serving each day of whole grains, beans, nuts, and seeds. Using olive oil instead of other fats. Limiting alcohol. Limiting red meat. Using nonfat or low-fat dairy products. Consider following a plant-based diet. This includes dietary choices that focus on eating mostly vegetables and fruit, grains, beans, nuts, and seeds. If you have high blood pressure, you may need to limit your sodium intake or follow a diet such as the DASH (Dietary Approaches to Stop Hypertension) eating  plan. The DASH diet aims to lower high blood pressure. Lifestyle Set weight loss goals with help from your health care team. It is recommended that most people with prediabetes lose 7% of their body weight. Exercise for at least 30 minutes 5 or more days a week. Attend a support group or seek support from a mental health counselor. Take over-the-counter and prescription medicines only as told by your health care provider. What foods are recommended? Fruits Berries. Bananas. Apples. Oranges. Grapes. Papaya. Mango. Pomegranate. Kiwi. Grapefruit. Cherries. Vegetables Lettuce. Spinach. Peas. Beets. Cauliflower. Cabbage. Broccoli. Carrots. Tomatoes. Squash. Eggplant. Herbs. Peppers. Onions. Cucumbers. Brussels sprouts. Grains Whole grains, such as whole-wheat or whole-grain breads, crackers, cereals, and pasta. Unsweetened oatmeal. Bulgur. Barley. Quinoa. Brown rice. Corn or whole-wheat flour tortillas or taco shells. Meats and other proteins Seafood. Poultry without skin. Lean cuts of pork and beef. Tofu. Eggs. Nuts. Beans. Dairy Low-fat or fat-free dairy products, such as yogurt, cottage cheese, and cheese. Beverages Water. Tea. Coffee. Sugar-free or diet soda. Seltzer water. Low-fat or nonfat milk. Milk alternatives, such as soy or almond milk. Fats and oils Olive oil. Canola oil. Sunflower oil. Grapeseed oil. Avocado. Walnuts. Sweets and desserts Sugar-free or low-fat pudding. Sugar-free or low-fat ice cream and other frozen treats. Seasonings and condiments Herbs. Sodium-free spices. Mustard. Relish. Low-salt, low-sugar ketchup. Low-salt, low-sugar barbecue sauce. Low-fat or fat-free mayonnaise. The items listed above may not be a complete list of recommended foods and beverages. Contact a dietitian for more information. What foods are not recommended? Fruits Fruits canned with syrup. Vegetables Canned vegetables. Frozen vegetables with butter or cream sauce. Grains Refined white  flour and flour  products, such as bread, pasta, snack foods, and cereals. Meats and other proteins Fatty cuts of meat. Poultry with skin. Breaded or fried meat. Processed meats. Dairy Full-fat yogurt, cheese, or milk. Beverages Sweetened drinks, such as iced tea and soda. Fats and oils Butter. Lard. Ghee. Sweets and desserts Baked goods, such as cake, cupcakes, pastries, cookies, and cheesecake. Seasonings and condiments Spice mixes with added salt. Ketchup. Barbecue sauce. Mayonnaise. The items listed above may not be a complete list of foods and beverages that are not recommended. Contact a dietitian for more information. Where to find more information American Diabetes Association: www.diabetes.org Summary You may need to make diet and lifestyle changes to help prevent the onset of diabetes. These changes can help you control blood sugar, improve cholesterol levels, and manage blood pressure. Set weight loss goals with help from your health care team. It is recommended that most people with prediabetes lose 7% of their body weight. Consider following a Mediterranean diet. This includes eating plenty of fresh fruits and vegetables, whole grains, beans, nuts, seeds, fish, and low-fat dairy, and using olive oil instead of other fats. This information is not intended to replace advice given to you by your health care provider. Make sure you discuss any questions you have with your health care provider. Document Revised: 03/12/2020 Document Reviewed: 03/12/2020 Elsevier Patient Education  2024 Elsevier Inc.  Healthy Eating, Adult Healthy eating may help you get and keep a healthy body weight, reduce the risk of chronic disease, and live a long and productive life. It is important to follow a healthy eating pattern. Your nutritional and calorie needs should be met mainly by different nutrient-rich foods. What are tips for following this plan? Reading food labels Read labels and choose the  following: Reduced or low sodium products. Juices with 100% fruit juice. Foods with low saturated fats (<3 g per serving) and high polyunsaturated and monounsaturated fats. Foods with whole grains, such as whole wheat, cracked wheat, brown rice, and wild rice. Whole grains that are fortified with folic acid. This is recommended for females who are pregnant or who want to become pregnant. Read labels and do not eat or drink the following: Foods or drinks with added sugars. These include foods that contain brown sugar, corn sweetener, corn syrup, dextrose, fructose, glucose, high-fructose corn syrup, honey, invert sugar, lactose, malt syrup, maltose, molasses, raw sugar, sucrose, trehalose, or turbinado sugar. Limit your intake of added sugars to less than 10% of your total daily calories. Do not eat more than the following amounts of added sugar per day: 6 teaspoons (25 g) for females. 9 teaspoons (38 g) for males. Foods that contain processed or refined starches and grains. Refined grain products, such as white flour, degermed cornmeal, white bread, and white rice. Shopping Choose nutrient-rich snacks, such as vegetables, whole fruits, and nuts. Avoid high-calorie and high-sugar snacks, such as potato chips, fruit snacks, and candy. Use oil-based dressings and spreads on foods instead of solid fats such as butter, margarine, sour cream, or cream cheese. Limit pre-made sauces, mixes, and "instant" products such as flavored rice, instant noodles, and ready-made pasta. Try more plant-protein sources, such as tofu, tempeh, black beans, edamame, lentils, nuts, and seeds. Explore eating plans such as the Mediterranean diet or vegetarian diet. Try heart-healthy dips made with beans and healthy fats like hummus and guacamole. Vegetables go great with these. Cooking Use oil to saut or stir-fry foods instead of solid fats such as butter, margarine, or lard. Try baking,  boiling, grilling, or broiling  instead of frying. Remove the fatty part of meats before cooking. Steam vegetables in water or broth. Meal planning  At meals, imagine dividing your plate into fourths: One-half of your plate is fruits and vegetables. One-fourth of your plate is whole grains. One-fourth of your plate is protein, especially lean meats, poultry, eggs, tofu, beans, or nuts. Include low-fat dairy as part of your daily diet. Lifestyle Choose healthy options in all settings, including home, work, school, restaurants, or stores. Prepare your food safely: Wash your hands after handling raw meats. Where you prepare food, keep surfaces clean by regularly washing with hot, soapy water. Keep raw meats separate from ready-to-eat foods, such as fruits and vegetables. Cook seafood, meat, poultry, and eggs to the recommended temperature. Get a food thermometer. Store foods at safe temperatures. In general: Keep cold foods at 72F (4.4C) or below. Keep hot foods at 172F (60C) or above. Keep your freezer at [facility] (-17.8C) or below. Foods are not safe to eat if they have been between the temperatures of 40-172F (4.4-60C) for more than 2 hours. What foods should I eat? Fruits Aim to eat 1-2 cups of fresh, canned (in natural juice), or frozen fruits each day. One cup of fruit equals 1 small apple, 1 large banana, 8 large strawberries, 1 cup (237 g) canned fruit,  cup (82 g) dried fruit, or 1 cup (240 mL) 100% juice. Vegetables Aim to eat 2-4 cups of fresh and frozen vegetables each day, including different varieties and colors. One cup of vegetables equals 1 cup (91 g) broccoli or cauliflower florets, 2 medium carrots, 2 cups (150 g) raw, leafy greens, 1 large tomato, 1 large bell pepper, 1 large sweet potato, or 1 medium white potato. Grains Aim to eat 5-10 ounce-equivalents of whole grains each day. Examples of 1 ounce-equivalent of grains include 1 slice of bread, 1 cup (40 g) ready-to-eat cereal, 3 cups (24 g)  popcorn, or  cup (93 g) cooked rice. Meats and other proteins Try to eat 5-7 ounce-equivalents of protein each day. Examples of 1 ounce-equivalent of protein include 1 egg,  oz nuts (12 almonds, 24 pistachios, or 7 walnut halves), 1/4 cup (90 g) cooked beans, 6 tablespoons (90 g) hummus or 1 tablespoon (16 g) peanut butter. A cut of meat or fish that is the size of a deck of cards is about 3-4 ounce-equivalents (85 g). Of the protein you eat each week, try to have at least 8 sounce (227 g) of seafood. This is about 2 servings per week. This includes salmon, trout, herring, sardines, and anchovies. Dairy Aim to eat 3 cup-equivalents of fat-free or low-fat dairy each day. Examples of 1 cup-equivalent of dairy include 1 cup (240 mL) milk, 8 ounces (250 g) yogurt, 1 ounces (44 g) natural cheese, or 1 cup (240 mL) fortified soy milk. Fats and oils Aim for about 5 teaspoons (21 g) of fats and oils per day. Choose monounsaturated fats, such as canola and olive oils, mayonnaise made with olive oil or avocado oil, avocados, peanut butter, and most nuts, or polyunsaturated fats, such as sunflower, corn, and soybean oils, walnuts, pine nuts, sesame seeds, sunflower seeds, and flaxseed. Beverages Aim for 6 eight-ounce glasses of water per day. Limit coffee to 3-5 eight-ounce cups per day. Limit caffeinated beverages that have added calories, such as soda and energy drinks. If you drink alcohol: Limit how much you have to: 0-1 drink a day if you are female. 0-2 drinks  a day if you are female. Know how much alcohol is in your drink. In the [place] one drink is one 12 oz bottle of beer (355 mL), one 5 oz glass of wine (148 mL), or one 1 oz glass of hard liquor (44 mL). Seasoning and other foods Try not to add too much salt to your food. Try using herbs and spices instead of salt. Try not to add sugar to food. This information is based on [place] nutrition guidelines. To learn more, visit [employer]. Exact  amounts may vary. You may need different amounts. This information is not intended to replace advice given to you by your health care provider. Make sure you discuss any questions you have with your health care provider. Document Revised: 09/12/2022 Document Reviewed: 09/12/2022 Elsevier Patient Education  2024 [employer].

## 2023-07-10 NOTE — Progress Notes (Signed)
Misty Payor, PhD, LAT, ATC acting as a scribe for [name], MD.  Misty Bailey is a 52 y.o. female who presents to [facility] at [facility] today for f/u R shoulder and neck pain w/ MRI and NCV study review. 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 Pt was last seen by Dr. [name] on 05/30/23 MRI's and NCV study were order and she was written to remain out of work.  Today, pt reports R shoulder is about the same maybe slightly worse. She is recovering from hernia repair surgery, but is continuing to heal.  Dr. [name] general surgery performed the surgery.  Dx testing: 06/19/23 C-spine & R shoulder MRI   06/15/23 NCV study 05/24/23 R shoulder & C-spine XR             05/15/23 Labs  Pertinent review of systems: No fevers or chills  Relevant historical information: Hypertension   Exam:  BP (!) 142/90   Pulse 77   Ht 5\' 1"  (1.549 m)   Wt 147 lb (66.7 kg)   SpO2 99%   BMI 27.78 kg/m  General: Well Developed, well nourished, and in no acute distress.   MSK: Right shoulder decreased range of motion.    Lab and Radiology Results  EXAM: MRI CERVICAL SPINE WITHOUT CONTRAST   TECHNIQUE: Multiplanar, multisequence MR imaging of the cervical spine was performed. No intravenous contrast was administered.   COMPARISON:  None Available.   FINDINGS: Evaluation is limited by motion, which particularly limits the axial sequences.   Alignment: Reversal of the normal cervical lordosis. Trace anterolisthesis of C3 on C4. Trace retrolisthesis of C6 on C7.   Vertebrae: No acute fracture, evidence of discitis, or suspicious osseous lesion.   Cord: Normal signal and morphology.   Posterior Fossa, vertebral arteries, paraspinal tissues: Negative.   Disc levels:   C2-C3: No significant disc bulge. No spinal canal stenosis or neuroforaminal narrowing.   C3-C4: Trace anterolisthesis and disc unroofing. No spinal canal stenosis or neural foraminal  narrowing.   C4-C5: Minimal disc bulge. Mild facet and uncovertebral hypertrophy. No spinal canal stenosis or neural foraminal narrowing.   C5-C6: Minimal disc bulge. Facet and uncovertebral hypertrophy. No spinal canal stenosis. Mild right neural foraminal narrowing.   C6-C7: Trace retrolisthesis and mild disc bulge. Facet and uncovertebral hypertrophy. No spinal canal stenosis. Mild left neural foraminal narrowing.   C7-T1: No significant disc bulge. No spinal canal stenosis or neuroforaminal narrowing.   IMPRESSION: 1. C5-C6 mild right neural foraminal narrowing. 2. C6-C7 mild left neural foraminal narrowing. 3. No spinal canal stenosis.     Electronically Signed   By: Wiliam Ke M.D.   On: 06/26/2023 02:46  EXAM: MRI OF THE RIGHT SHOULDER WITHOUT CONTRAST   TECHNIQUE: Multiplanar, multisequence MR imaging of the shoulder was performed. No intravenous contrast was administered.   COMPARISON:  None Available.   FINDINGS: Patient motion degrades image quality limiting evaluation.   Rotator cuff: Mild tendinosis of the supraspinatus tendon anteriorly. Infraspinatus tendon is intact. Teres minor tendon is intact. Subscapularis tendon is intact.   Muscles: No muscle atrophy or edema. No intramuscular fluid collection or hematoma.   Biceps Long Head: Intraarticular and extraarticular portions of the biceps tendon are intact.   Acromioclavicular Joint: Mild arthropathy of the acromioclavicular joint. Small amount of subacromial/subdeltoid bursal fluid.   Glenohumeral Joint: No joint effusion. No chondral defect. Mild relative thickening of the inferior joint capsule as can be seen with  adhesive capsulitis.   Labrum: Grossly intact, but evaluation is limited by lack of intraarticular fluid/contrast.   Bones: No fracture or dislocation. No aggressive osseous lesion.   Other: No fluid collection or hematoma.   IMPRESSION: 1. Mild tendinosis of the  supraspinatus tendon anteriorly. 2. Mild subacromial/subdeltoid bursitis. 3. Mild relative thickening of the inferior joint capsule as can be seen with adhesive capsulitis.     Electronically Signed   By: Elige Ko M.D.   On: 06/24/2023 06:17   NCS (NERVE CONDUCTION STUDY) WITH EMG (ELECTROMYOGRAPHY) REPORT     STUDY DATE: 06/15/23 PATIENT NAME: Misty Bailey DOB: 10/16/1971 MRN: [id number]   ORDERING CLINICIAN: Joycelyn Schmid, MD    TECHNOLOGIST: Jenelle Mages ELECTROMYOGRAPHER: Glenford Bayley. Penumalli, MD   CLINICAL INFORMATION: 52 year old female with right upper extremity pain.     FINDINGS: NERVE CONDUCTION STUDY:   Right median and right ulnar motor responses are normal.  Right ulnar F-wave latency is normal.   Right median, right ulnar, right radial sensory responses are normal.   Right median to ulnar transcarpal study is normal.     NEEDLE ELECTROMYOGRAPHY:   Needle examination of right upper extremity (deltoid, biceps, triceps, flexor carpi radialis, first dorsal interosseous) and right cervical paraspinal muscles is normal.       IMPRESSION:    Normal study.  No electrodiagnostic evidence of large fiber neuropathy or right cervical radiculopathy this time.     INTERPRETING PHYSICIAN:  Suanne Marker, MD Certified in Neurology, Neurophysiology and Neuroimaging   [facility] [address] [place], [place] [address] [phone]   Assessment and Plan: 52 y.o. female with chronic right shoulder and upper arm pain.  I first saw her in May 29 I was suspicious for adhesive capsulitis and performed a intra-articular glenohumeral injection and physical therapy.  This did not work very well.  Subsequent tests have demonstrated that adhesive capsulitis is probably the main issue.  Plan for surgical consultation to discuss the potential of manipulation under anesthesia and probable decompression.  Refer to orthopedics.  She is seen  general surgery Dr. [name] and when I mention that his twin brother works at [employer] and could do the surgery should be delighted to see him.   PDMP not reviewed this encounter. Orders Placed This Encounter  Procedures   Ambulatory referral to Orthopedic Surgery    Referral Priority:   Routine    Referral Type:   Surgical    Referral Reason:   Specialty Services Required    Requested Specialty:   Orthopedic Surgery    Number of Visits Requested:   1   No orders of the defined types were placed in this encounter.    Discussed warning signs or symptoms. Please see discharge instructions. Patient expresses understanding.   The above documentation has been reviewed and is accurate and complete [name], M.D. Total encounter time 30 minutes including face-to-face time with the patient and, reviewing past medical record, and charting on the date of service.

## 2023-07-11 ENCOUNTER — Telehealth: Payer: Self-pay | Admitting: Internal Medicine

## 2023-07-11 DIAGNOSIS — E538 Deficiency of other specified B group vitamins: Secondary | ICD-10-CM

## 2023-07-11 LAB — FOLATE: Folate: 7.2 ng/mL (ref >3.0)

## 2023-07-11 LAB — VITAMIN B12: Vitamin B-12: 214 pg/mL — ABNORMAL LOW (ref 232–1245)

## 2023-07-11 MED ORDER — CYANOCOBALAMIN 1000 MCG/ML IJ SOLN
1000.0000 ug | Freq: Once | INTRAMUSCULAR | Status: DC
Start: 2023-07-12 — End: 2023-10-27

## 2023-07-12 ENCOUNTER — Telehealth: Payer: Self-pay

## 2023-07-12 NOTE — Telephone Encounter (Signed)
Called but no answer. Unable to LVM due to VM not being set up.

## 2023-07-12 NOTE — Telephone Encounter (Signed)
Nurse visit scheduled for 07/13/2023 at 3:00 p.m. MyChart sent to patient to inform of appointment.

## 2023-07-12 NOTE — Telephone Encounter (Signed)
[employer] ADA form  [place] disability  Received 07/12/23 Due: not listed

## 2023-07-13 ENCOUNTER — Ambulatory Visit: Payer: No Typology Code available for payment source | Attending: Internal Medicine

## 2023-07-13 DIAGNOSIS — E538 Deficiency of other specified B group vitamins: Secondary | ICD-10-CM

## 2023-07-13 MED ORDER — CYANOCOBALAMIN 1000 MCG/ML IJ SOLN
1000.0000 ug | Freq: Once | INTRAMUSCULAR | Status: AC
Start: 2023-07-13 — End: 2023-07-13
  Administered 2023-07-13: 1000 ug via INTRAMUSCULAR

## 2023-07-13 NOTE — Progress Notes (Signed)
B12 administered in right deltoid per protocols.Patient denies and pain or discomfort at injection site. Tolerated injection well no reaction.

## 2023-07-13 NOTE — Telephone Encounter (Signed)
Form completed and placed on Dr. Corey's desk to review and sign.  

## 2023-07-13 NOTE — Addendum Note (Signed)
Addended by: Elsie Lincoln F on: 07/13/2023 05:07 PM   Modules accepted: Level of Service

## 2023-07-14 NOTE — Telephone Encounter (Signed)
Form faxed successfully and sent to scan.

## 2023-07-21 ENCOUNTER — Encounter: Payer: Self-pay | Admitting: Family Medicine

## 2023-07-24 MED ORDER — HYDROCODONE-ACETAMINOPHEN 5-325 MG PO TABS: 1.0000 | ORAL_TABLET | Freq: Four times a day (QID) | ORAL | 0 refills | Status: DC | PRN

## 2023-07-25 ENCOUNTER — Telehealth: Payer: Self-pay

## 2023-07-25 NOTE — Telephone Encounter (Signed)
Hydrocodone-APAP 5-325 mg tab  CoverMyMeds.com KEY: [id number]

## 2023-07-25 NOTE — Telephone Encounter (Signed)
APPROVED PA# [id number] Rx #: [id number] Valid - Additional information will be provided in the approval communication.

## 2023-07-25 NOTE — Telephone Encounter (Signed)
Prior Authorization initiated for Hydrocodone-APAP via [email or web address] KEY: [id number]

## 2023-07-27 ENCOUNTER — Encounter: Payer: Self-pay | Admitting: Orthopaedic Surgery

## 2023-07-27 ENCOUNTER — Ambulatory Visit: Payer: No Typology Code available for payment source | Admitting: Orthopaedic Surgery

## 2023-07-27 DIAGNOSIS — M24611 Ankylosis, right shoulder: Secondary | ICD-10-CM

## 2023-07-27 DIAGNOSIS — M7541 Impingement syndrome of right shoulder: Secondary | ICD-10-CM | POA: Diagnosis not present

## 2023-07-27 NOTE — Progress Notes (Signed)
The patient is a very pleasant 52 year old female who was sent to me from Dr. [name] to evaluate and treat a right frozen shoulder.  She has been having worsening shoulder pain over the last 4 to 5 months without any injury.  This started off with just some right shoulder pain that was slight but then is slowly progressively got worse.  Dr. [name] has tried all modes of conservative treatment including anti-inflammatories, physical therapy and a steroid injection.  The recent MRI of the shoulder was obtained showing just some mild tendinosis of the rotator cuff but there is definitely thickening of the capsule suggesting adhesive capsulitis.  She has no current significant active medical issues.  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  She denies any numbness and tingling in her hands.  She has had nerve studies as well.  I was able to review all of her medications and notes within epic including past medical history.  Examination of her left shoulder shows full and fluid range of motion.  Examination of her right shoulder shows significant limitations with forward flexion as well as abduction and external rotation.  Even reaching behind her is only to her belt level but with her left arm she can reach to the mid to upper thoracic spine.  I did review all imaging studies from plain films of the MRI of her left shoulder.  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  At this point I recommended a manipulation under anesthesia followed by an arthroscopic intervention with lysis of adhesions and a subacromial decompression.  I showed her shoulder model and I described in detail what the surgery involves as well as the intraoperative and postoperative course.  She would definitely need some therapy afterwards to continue to get her shoulder moving fluidly.  I explained  the etiology of arthrofibrosis.  She is a prediabetic.  She again has not had any type of injury.  This is something that can slowly get worse as someone guards their shoulder from inflammatory pain.  She does wish to proceed with surgical intervention for her right shoulder.  Will work on getting this scheduled and we will be in touch about the surgery.  All questions and concerns were addressed and answered.

## 2023-08-07 ENCOUNTER — Encounter: Payer: Self-pay | Admitting: Orthopaedic Surgery

## 2023-08-08 ENCOUNTER — Other Ambulatory Visit: Payer: Self-pay

## 2023-08-08 MED ORDER — HYDROCODONE-ACETAMINOPHEN 5-325 MG PO TABS
1.0000 | ORAL_TABLET | Freq: Four times a day (QID) | ORAL | 0 refills | Status: DC | PRN
  Filled 2023-08-08: qty 25, 7d supply, fill #0

## 2023-08-08 NOTE — Telephone Encounter (Signed)
Pharmacy updated to [facility].   Rx pending.   Controlled substance, forwarding to Dr. [name].

## 2023-08-08 NOTE — Addendum Note (Signed)
Addended by: Dierdre Searles on: 08/08/2023 10:28 AM   Modules accepted: Orders

## 2023-08-09 ENCOUNTER — Other Ambulatory Visit: Payer: Self-pay

## 2023-08-10 ENCOUNTER — Other Ambulatory Visit: Payer: Self-pay

## 2023-08-17 ENCOUNTER — Encounter: Payer: No Typology Code available for payment source | Admitting: Diagnostic Neuroimaging

## 2023-08-21 ENCOUNTER — Telehealth: Payer: Self-pay

## 2023-08-21 NOTE — Telephone Encounter (Signed)
[name]  P [facility] (supporting Rodolph Bong, MD)3 hours ago (7:54 AM)    Gooooood morning [facility] you are well this morning. 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 Dr [name] has referred me to a surgeon however the appointment for my surgery has not been made yet. Since I can't return back to work yet they need updated paperwork. I'm going to attempt to leave it before noon and hopefully you will be able to send it off today. I do appreciate your help in this matter. SN: I will also reach out to the surgeon to make sure I haven't missed the phone call or anything.    I hope you have a fantastic day :-)    Sincerely  Misty Bailey

## 2023-08-24 NOTE — Telephone Encounter (Signed)
Forms reviewed and signed by Dr. [name].   FMLA form faxed back to [employer].   ADA form faxed back to [employer].

## 2023-08-24 NOTE — Telephone Encounter (Signed)
Form completed and placed on Dr. Corey's desk to review and sign.  

## 2023-09-13 ENCOUNTER — Ambulatory Visit (HOSPITAL_COMMUNITY)
Admission: RE | Admit: 2023-09-13 | Discharge: 2023-09-13 | Disposition: A | Discharge status: Home / Self Care | Payer: No Typology Code available for payment source | Source: Ambulatory Visit | Attending: Emergency Medicine | Admitting: Emergency Medicine

## 2023-09-13 ENCOUNTER — Encounter (HOSPITAL_COMMUNITY): Payer: Self-pay

## 2023-09-13 VITALS — BP 120/79 | HR 64 | Temp 98.1°F | Resp 16

## 2023-09-13 DIAGNOSIS — J45901 Unspecified asthma with (acute) exacerbation: Secondary | ICD-10-CM

## 2023-09-13 DIAGNOSIS — J0110 Acute frontal sinusitis, unspecified: Secondary | ICD-10-CM | POA: Diagnosis not present

## 2023-09-13 MED ORDER — BENZONATATE 100 MG PO CAPS: 100.0000 mg | ORAL_CAPSULE | Freq: Three times a day (TID) | ORAL | 0 refills | Status: DC

## 2023-09-13 MED ORDER — PROMETHAZINE-DM 6.25-15 MG/5ML PO SYRP: 5.0000 mL | ORAL_SOLUTION | Freq: Four times a day (QID) | ORAL | 0 refills | Status: DC | PRN

## 2023-09-13 MED ORDER — AMOXICILLIN-POT CLAVULANATE 875-125 MG PO TABS: 1.0000 | ORAL_TABLET | Freq: Two times a day (BID) | ORAL | 0 refills | Status: DC

## 2023-09-13 MED ORDER — PREDNISONE 20 MG PO TABS: 40.0000 mg | ORAL_TABLET | Freq: Every day | ORAL | 0 refills | Status: AC

## 2023-09-13 NOTE — ED Provider Notes (Addendum)
[facility]    CSN: [id number] Arrival date & time: 09/13/23  1318      History   Chief Complaint Chief Complaint  Patient presents with   Cough    Cough since Friday 6 SeptStuffy head and noseMild sore throat - Entered by patient    HPI Misty Bailey is a 52 y.o. female.   Patient presents to clinic for dry, non-productive cough, fatigue, nasal congestion, sinus pressure and feeling unwell since 09/01/23.  A family member had similar symptoms, and they tested positive for COVID-19.  She had done multiple tests at home during her initial illness and all were negative.    She does have a history of asthma and has been having shortness of breath and wheezing with her coughing spells.  She last used albuterol this morning.  Denies any fevers.  No nausea, vomiting or diarrhea.    The history is provided by the patient and medical records.  Cough Associated symptoms: shortness of breath and wheezing   Associated symptoms: no chest pain and no fever     Past Medical History:  Diagnosis Date   Anemia    Anxiety    Arthritis 02/23/2014   Rib Cage   Asthma    Depression    Diverticulosis    Dysmenorrhea 05/08/2018   Essential hypertension 07/29/2019   HLD (hyperlipidemia)    Panic attacks    Pre-diabetes    Suppurative hidradenitis     Patient Active Problem List   Diagnosis Date Noted   Neuropathy 06/22/2023   Influenza vaccine refused 01/04/2021   Inguinal hernia of left side without obstruction or gangrene 04/30/2020   Hyperlipidemia 07/30/2019   Syncope 07/29/2019   Essential hypertension 07/29/2019   Dysmenorrhea 05/08/2018   Herpes 03/21/2014   Suppurative hidradenitis    Depression     Past Surgical History:  Procedure Laterality Date   CHOLECYSTECTOMY N/A 06/28/2023   Procedure: LAPAROSCOPIC CHOLECYSTECTOMY;  Surgeon: Abigail Miyamoto, MD;  Location: [facility] OR;  Service: General;  Laterality: N/A;  TAP BLOCK   INGUINAL HERNIA REPAIR Left  06/28/2023   Procedure: OPEN LEFT INGUINAL HERNIA REPAIR WITH MESH;  Surgeon: Abigail Miyamoto, MD;  Location: [facility] OR;  Service: General;  Laterality: Left;   oral surgery     PELVIC LAPAROSCOPY  1998/1999 `   pelvic adhesions and pain   TOE SURGERY Left    great toe   WISDOM TOOTH EXTRACTION      OB History     Gravida  2   Para  1   Term      Preterm  1   AB  1   Living  0      SAB  1   IAB      Ectopic      Multiple      Live Births               Home Medications    Prior to Admission medications   Medication Sig Start Date End Date Taking? Authorizing Provider  albuterol (VENTOLIN HFA) 108 (90 Base) MCG/ACT inhaler Inhale 2 puffs into the lungs every 6 (six) hours as needed for wheezing or shortness of breath. 03/08/23  Yes Marcine Matar, MD  amLODipine (NORVASC) 10 MG tablet Take 1 tablet (10 mg total) by mouth daily. 05/11/23  Yes Marcine Matar, MD  amoxicillin-clavulanate (AUGMENTIN) 875-125 MG tablet Take 1 tablet by mouth every 12 (twelve) hours. 09/13/23  Yes Rinaldo Ratel, [place] N,  FNP  atorvastatin (LIPITOR) 10 MG tablet Take 1 tablet (10 mg total) by mouth daily. 07/10/23  Yes Marcine Matar, MD  benzonatate (TESSALON) 100 MG capsule Take 1 capsule (100 mg total) by mouth every 8 (eight) hours. 09/13/23  Yes Rinaldo Ratel, [place] N, FNP  desonide (DESOWEN) 0.05 % ointment Apply 1 Application topically daily as needed (rash). 03/23/23  Yes [provider]  fluticasone (FLONASE) 50 MCG/ACT nasal spray Place 1 spray into both nostrils daily. Patient taking differently: Place 1 spray into both nostrils daily as needed for allergies. 06/08/22  Yes Claiborne Rigg, NP  gabapentin (NEURONTIN) 100 MG capsule Take 3 capsules (300 mg total) by mouth at bedtime as needed. 06/21/23  Yes Storm Frisk, MD  losartan (COZAAR) 25 MG tablet Take 1 tablet (25 mg total) by mouth daily. 07/10/23  Yes Marcine Matar, MD  predniSONE (DELTASONE) 20 MG  tablet Take 2 tablets (40 mg total) by mouth daily with breakfast for 5 days. 09/13/23 09/18/23 Yes Rinaldo Ratel, [place] N, FNP  promethazine-dextromethorphan (PROMETHAZINE-DM) 6.25-15 MG/5ML syrup Take 5 mLs by mouth 4 (four) times daily as needed for cough. 09/13/23  Yes Rinaldo Ratel, [place] N, FNP  tiZANidine (ZANAFLEX) 4 MG tablet Take 1 tablet (4 mg total) by mouth every 8 (eight) hours as needed for muscle spasms. 05/24/23  Yes Rodolph Bong, MD  HYDROcodone-acetaminophen (NORCO/VICODIN) 5-325 MG tablet Take 1 tablet by mouth every 6 (six) hours as needed for moderate pain or severe pain. 08/08/23   Rodolph Bong, MD    Family History Family History  Problem Relation Age of Onset   Diabetes Mother    Hypertension Mother    Hyperlipidemia Mother    Heart murmur Mother    Crohn's disease Mother    Hypertension Father    Stroke Father    Hyperlipidemia Father    Diabetes Sister    Hypertension Sister    Hyperlipidemia Sister    Hypertension Maternal Grandmother    Diabetes Maternal Grandmother    Osteoporosis Maternal Grandmother    Cirrhosis Maternal Grandfather    Thyroid disease Paternal Aunt    Breast cancer Neg Hx     Social History Social History   Tobacco Use   Smoking status: Former    Current packs/day: 0.00    Types: Cigarettes    Quit date: 1996    Years since quitting: 28.7   Smokeless tobacco: Never   Tobacco comments:    Weed  Advertising account planner   Vaping status: Never Used  Substance Use Topics   Alcohol use: Yes    Comment: social   Drug use: Yes    Frequency: 3.0 times per week    Types: Marijuana     Allergies   Codeine   Review of Systems Review of Systems  Constitutional:  Positive for fatigue. Negative for fever.  HENT:  Positive for congestion, sinus pressure and sinus pain.   Respiratory:  Positive for cough, shortness of breath and wheezing.   Cardiovascular:  Negative for chest pain.  Gastrointestinal:  Negative for abdominal pain, diarrhea, nausea  and vomiting.     Physical Exam Triage Vital Signs ED Triage Vitals  Encounter Vitals Group     BP 09/13/23 1345 120/79     Systolic BP Percentile --      Diastolic BP Percentile --      Pulse Rate 09/13/23 1347 64     Resp 09/13/23 1345 16     Temp 09/13/23 1345  98.1 F (36.7 C)     Temp Source 09/13/23 1345 Oral     SpO2 09/13/23 1345 98 %     Weight --      Height --      Head Circumference --      Peak Flow --      Pain Score --      Pain Loc --      Pain Education --      Exclude from Growth Chart --    No data found.  Updated Vital Signs BP 120/79   Pulse 64   Temp 98.1 F (36.7 C) (Oral)   Resp 16   SpO2 98%   Visual Acuity Right Eye Distance:   Left Eye Distance:   Bilateral Distance:    Right Eye Near:   Left Eye Near:    Bilateral Near:     Physical Exam Vitals and nursing note reviewed.  Constitutional:      Appearance: Normal appearance.  HENT:     Head: Normocephalic and atraumatic.     Right Ear: External ear normal.     Left Ear: External ear normal.     Nose: Congestion and rhinorrhea present.     Mouth/Throat:     Mouth: Mucous membranes are moist.  Eyes:     Conjunctiva/sclera: Conjunctivae normal.  Cardiovascular:     Rate and Rhythm: Normal rate and regular rhythm.     Heart sounds: Normal heart sounds. No murmur heard. Pulmonary:     Effort: Pulmonary effort is normal.     Breath sounds: Wheezing present.  Musculoskeletal:        General: Normal range of motion.  Skin:    General: Skin is warm and dry.  Neurological:     General: No focal deficit present.     Mental Status: She is alert and oriented to person, place, and time.  Psychiatric:        Mood and Affect: Mood normal.        Behavior: Behavior normal.      UC Treatments / Results  Labs (all labs ordered are listed, but only abnormal results are displayed) Labs Reviewed - No data to display  EKG   Radiology No results found.  Procedures Procedures  (including critical care time)  Medications Ordered in UC Medications - No data to display  Initial Impression / Assessment and Plan / UC Course  I have reviewed the triage vital signs and the nursing notes.  Pertinent labs & imaging results that were available during my care of the patient were reviewed by me and considered in my medical decision making (see chart for details).  Vitals and triage reviewed, patient is hemodynamically stable.  Heart with regular rate and rhythm.  Lungs with mild expiratory wheezing.  Oxygenation is stable.  Low suspicion for viral etiology due to duration of symptoms.  Cough is dry and nonproductive.  Symptoms have been present for 12 days.  Suspect bacterial sinusitis and mild asthma exacerbation.  Will cover with Augmentin and steroid burst.  Symptomatic management discussed.  Plan of care, follow-up care and emergency precautions given, no questions at this time.     Final Clinical Impressions(s) / UC Diagnoses   Final diagnoses:  Acute non-recurrent frontal sinusitis  Mild asthma exacerbation     Discharge Instructions      Take all antibiotics as prescribed and until finished, you can take them with food to prevent gastrointestinal upset.  Ensure you are  drinking plenty of fluids, at least 64 ounces of water daily.  Continue to use your albuterol inhaler as needed, start taking the steroids daily with breakfast.  You can take the Tessalon Perles throughout the day and the promethazine syrup at night, do not drink or drive on the syrup as it may cause drowsiness.  Return to clinic or seek immediate care if you develop worsening shortness of breath, or no improvement in symptoms despite these medications and interventions.      ED Prescriptions     Medication Sig Dispense Auth. Provider   predniSONE (DELTASONE) 20 MG tablet Take 2 tablets (40 mg total) by mouth daily with breakfast for 5 days. 10 tablet Rinaldo Ratel, [place], FNP    amoxicillin-clavulanate (AUGMENTIN) 875-125 MG tablet Take 1 tablet by mouth every 12 (twelve) hours. 14 tablet Rinaldo Ratel, [place] N, [place]   benzonatate (TESSALON) 100 MG capsule Take 1 capsule (100 mg total) by mouth every 8 (eight) hours. 21 capsule Rinaldo Ratel, [place], [place]   promethazine-dextromethorphan (PROMETHAZINE-DM) 6.25-15 MG/5ML syrup Take 5 mLs by mouth 4 (four) times daily as needed for cough. 118 mL Dazhane Villagomez, [place], [place]      PDMP not reviewed this encounter.      Gussie Towson, [place] N, [place] 09/13/23 1407

## 2023-09-13 NOTE — Discharge Instructions (Addendum)
Take all antibiotics as prescribed and until finished, you can take them with food to prevent gastrointestinal upset.  Ensure you are drinking plenty of fluids, at least 64 ounces of water daily.  Continue to use your albuterol inhaler as needed, start taking the steroids daily with breakfast.  You can take the Tessalon Perles throughout the day and the promethazine syrup at night, do not drink or drive on the syrup as it may cause drowsiness.  Return to clinic or seek immediate care if you develop worsening shortness of breath, or no improvement in symptoms despite these medications and interventions.

## 2023-09-13 NOTE — ED Triage Notes (Signed)
Pt presents to the office for sore throat,cough and nasal congestion x 2 weeks. Pt has not taken any medication to help.

## 2023-09-13 NOTE — ED Triage Notes (Signed)
Pt last use Albuterol inhaler was this morning.

## 2023-09-21 ENCOUNTER — Encounter: Payer: No Typology Code available for payment source | Admitting: Orthopaedic Surgery

## 2023-10-06 ENCOUNTER — Telehealth: Payer: Self-pay

## 2023-10-06 NOTE — Telephone Encounter (Signed)
Called but no answer. LVM to call back, informed that handicap placard form is ready for pick-up.

## 2023-10-09 NOTE — Telephone Encounter (Signed)
Called & spoke to the patient. Verified name & DOB. Informed that form is ready for pick-up. Patient expressed verbal understanding and will pick-up tomorrow 10/10/23. No further assistance required at this time.

## 2023-10-12 ENCOUNTER — Other Ambulatory Visit: Payer: Self-pay | Admitting: Orthopaedic Surgery

## 2023-10-12 DIAGNOSIS — M24611 Ankylosis, right shoulder: Secondary | ICD-10-CM | POA: Diagnosis not present

## 2023-10-12 MED ORDER — OXYCODONE-ACETAMINOPHEN 5-325 MG PO TABS: 1.0000 | ORAL_TABLET | Freq: Four times a day (QID) | ORAL | 0 refills | Status: DC | PRN

## 2023-10-13 ENCOUNTER — Telehealth: Payer: Self-pay | Admitting: Orthopaedic Surgery

## 2023-10-13 NOTE — Telephone Encounter (Signed)
Patient aware this PA was approved and I called the pharmacy and asked them to re-run through insurance

## 2023-10-13 NOTE — Telephone Encounter (Signed)
Patient called and surgery yesterday. [facility] sent her medication in but the pharmacy sent it back for approval. [phone]

## 2023-10-19 ENCOUNTER — Encounter: Payer: Self-pay | Admitting: Orthopaedic Surgery

## 2023-10-19 ENCOUNTER — Other Ambulatory Visit: Payer: Self-pay

## 2023-10-19 ENCOUNTER — Ambulatory Visit: Payer: No Typology Code available for payment source | Admitting: Orthopaedic Surgery

## 2023-10-19 ENCOUNTER — Encounter: Payer: Self-pay | Admitting: Family Medicine

## 2023-10-19 DIAGNOSIS — Z9889 Other specified postprocedural states: Secondary | ICD-10-CM

## 2023-10-19 DIAGNOSIS — M24611 Ankylosis, right shoulder: Secondary | ICD-10-CM

## 2023-10-19 NOTE — Progress Notes (Signed)
The patient is here for first postoperative visit 1 week status post a right shoulder manipulation under anesthesia with a lysis of adhesions.  She did develop a frozen shoulder.  Fortunately we did not find any significant issues within the shoulder itself other than some scar tissue and a tight anterior capsule.  We released all that.  Intraoperatively with the manipulation I was able to get her shoulder fully abducted and fully forward flex and full external rotation.  She was not clear on the instructions afterwards where I wanted her to only wear the sling for a day.  She has been in her sling and is only worked on a little bit of motion of her shoulder.  On exam today we did remove the sutures.  She has lost a lot of the motion that we were able to gain from the manipulation.  It is essential she stop wearing her sling and we get her into outpatient physical therapy for aggressive major motion of her shoulder.  I have asked her to get back in with Dr. [name] as soon as possible to see if he can provide 1 more intra-articular steroid injection in her right shoulder joint under ultrasound guidance which could also help hopefully keep the scar tissue at bay.  I would like to see her back myself in 4 weeks.  She understands all of this.

## 2023-10-22 ENCOUNTER — Encounter: Payer: Self-pay | Admitting: Family Medicine

## 2023-10-22 ENCOUNTER — Encounter: Payer: Self-pay | Admitting: Orthopaedic Surgery

## 2023-10-22 ENCOUNTER — Encounter: Payer: Self-pay | Admitting: Internal Medicine

## 2023-10-23 ENCOUNTER — Emergency Department (HOSPITAL_BASED_OUTPATIENT_CLINIC_OR_DEPARTMENT_OTHER)
Admission: EM | Admit: 2023-10-23 | Discharge: 2023-10-23 | Disposition: A | Discharge status: Home / Self Care | Payer: No Typology Code available for payment source | Attending: Emergency Medicine | Admitting: Emergency Medicine

## 2023-10-23 ENCOUNTER — Other Ambulatory Visit (HOSPITAL_BASED_OUTPATIENT_CLINIC_OR_DEPARTMENT_OTHER): Payer: Self-pay

## 2023-10-23 ENCOUNTER — Ambulatory Visit (HOSPITAL_COMMUNITY)
Admission: RE | Admit: 2023-10-23 | Discharge: 2023-10-23 | Disposition: A | Discharge status: Home / Self Care | Payer: No Typology Code available for payment source | Source: Ambulatory Visit | Attending: Family Medicine | Admitting: Family Medicine

## 2023-10-23 ENCOUNTER — Telehealth: Payer: Self-pay

## 2023-10-23 ENCOUNTER — Encounter (HOSPITAL_COMMUNITY): Payer: Self-pay

## 2023-10-23 ENCOUNTER — Other Ambulatory Visit: Payer: Self-pay

## 2023-10-23 ENCOUNTER — Encounter (HOSPITAL_BASED_OUTPATIENT_CLINIC_OR_DEPARTMENT_OTHER): Payer: Self-pay

## 2023-10-23 VITALS — BP 135/85 | HR 62 | Temp 98.0°F | Resp 16

## 2023-10-23 DIAGNOSIS — Z79899 Other long term (current) drug therapy: Secondary | ICD-10-CM | POA: Diagnosis not present

## 2023-10-23 DIAGNOSIS — E119 Type 2 diabetes mellitus without complications: Secondary | ICD-10-CM | POA: Insufficient documentation

## 2023-10-23 DIAGNOSIS — I10 Essential (primary) hypertension: Secondary | ICD-10-CM | POA: Diagnosis not present

## 2023-10-23 DIAGNOSIS — R55 Syncope and collapse: Secondary | ICD-10-CM

## 2023-10-23 LAB — URINALYSIS, ROUTINE W REFLEX MICROSCOPIC
Bilirubin Urine: NEGATIVE
Glucose, UA: NEGATIVE mg/dL
Hgb urine dipstick: NEGATIVE
Ketones, ur: NEGATIVE mg/dL
Leukocytes,Ua: NEGATIVE
Nitrite: NEGATIVE
Protein, ur: NEGATIVE mg/dL
Specific Gravity, Urine: 1.022 (ref 1.005–1.030)
pH: 5.5 (ref 5.0–8.0)

## 2023-10-23 LAB — BASIC METABOLIC PANEL
Anion gap: 7 (ref 5–15)
BUN: 15 mg/dL (ref 6–20)
CO2: 27 mmol/L (ref 22–32)
Calcium: 9.6 mg/dL (ref 8.9–10.3)
Chloride: 106 mmol/L (ref 98–111)
Creatinine, Ser: 0.96 mg/dL (ref 0.44–1.00)
GFR, Estimated: >60 mL/min (ref >60)
Glucose, Bld: 107 mg/dL — ABNORMAL HIGH (ref 70–99)
Potassium: 3.8 mmol/L (ref 3.5–5.1)
Sodium: 140 mmol/L (ref 135–145)

## 2023-10-23 LAB — CBC
HCT: 42.2 % (ref 36.0–46.0)
Hemoglobin: 13.5 g/dL (ref 12.0–15.0)
MCH: 28.6 pg (ref 26.0–34.0)
MCHC: 32 g/dL (ref 30.0–36.0)
MCV: 89.4 fL (ref 80.0–100.0)
Platelets: 277 10*3/uL (ref 150–400)
RBC: 4.72 MIL/uL (ref 3.87–5.11)
RDW: 14 % (ref 11.5–15.5)
WBC: 6.1 10*3/uL (ref 4.0–10.5)
nRBC: 0 % (ref 0.0–0.2)

## 2023-10-23 LAB — PREGNANCY, URINE: Preg Test, Ur: NEGATIVE

## 2023-10-23 NOTE — Telephone Encounter (Signed)
     10/19/23 10:51 PM Good morning    I hope you are well. I wanted to provide an update. My surgery was done last Thursday 12 Oct 2023; scar tissue removed. My follow up appointment was 2day; stitched were removed. There has not been much change in my range of motion, at least not yet, recovery time 6-8 weeks I think.  Dr. Eliberto Ivory office will be setting up physical therapy for me, to also help w/getting range of motion back. Also wanted me to make an appointment w/your office for another injection. If you want to go ahead and schedule the appointment that's ok with me; especially if needed b4 therapy. I prefer a morning appointment, however whatever time is good for you will work for me.    Also wanted to send message to Gearldine Bienenstock to provide updated paperwork for work ([employer] and [employer]). She can use the last paperwork she sent and just change the date. If she wants me to provide new cover sheets just let me know. Whichever is easier for her. Please and thank you.   I look forward to hearing from you. Have a great Friday    Sincerely,   Katlynn Simmers

## 2023-10-23 NOTE — ED Triage Notes (Signed)
Pt presenting c/o x2 syncope episodes on Friday. Was evaluated at [facility] today and sent here for further workup. Recent shoulder surgery.  HA currently, denies vision changes, denies vomiting. Some nausea prior to syncope episode, bilateral arm tingling, heavy limbs, 'felt like panic attack', SHOB during episode   Recent med change

## 2023-10-23 NOTE — Discharge Instructions (Signed)
You were seen for passing out (syncope) in the emergency department.  It was likely due to a vasovagal event.  At home, please stay well-hydrated.    Check your MyChart online for the results of any tests that had not resulted by the time you left the emergency department.   Follow-up with your primary doctor in 2-3 days regarding your visit.  Please talk to them to see if you need a cardiology referral.  Return immediately to the emergency department if you experience any of the following: Chest pain, shortness of breath, or any other concerning symptoms.    Thank you for visiting our Emergency Department. It was a pleasure taking care of you today.

## 2023-10-23 NOTE — ED Provider Notes (Signed)
[facility]    CSN: [id number] Arrival date & time: 10/23/23  4098      History   Chief Complaint Chief Complaint  Patient presents with   Fall    I passed out twice Friday evening - waking up on the floorsymptoms b4: tingling in both arms, limbs heaving, sob and nausea. - Entered by patient    HPI Misty Bailey is a 52 y.o. female.    Fall   Here for fainting spells that happened on the evening of October 25.  She had gone into her bathroom to get some alcohol because she had been feeling tingly in her hands and legs and felt a little nauseated.  She thought maybe she was having a panic attack.  She then passed out and awoke on the floor.  She is not sure how long she was out and then she proceeded to pass out again.  She then called her boyfriend and she stayed with him all weekend and did not seek medical care.  This morning she is here to be seen and still feels "off" and may be a little dizzy.  No vomiting.  No fever  She did have arthroscopic shoulder surgery on October 17.   She does not have diabetes and does not take sugar lowering medications Past Medical History:  Diagnosis Date   Anemia    Anxiety    Arthritis 02/23/2014   Rib Cage   Asthma    Depression    Diverticulosis    Dysmenorrhea 05/08/2018   Essential hypertension 07/29/2019   HLD (hyperlipidemia)    Panic attacks    Pre-diabetes    Suppurative hidradenitis     Patient Active Problem List   Diagnosis Date Noted   Neuropathy 06/22/2023   Influenza vaccine refused 01/04/2021   Inguinal hernia of left side without obstruction or gangrene 04/30/2020   Hyperlipidemia 07/30/2019   Syncope 07/29/2019   Essential hypertension 07/29/2019   Dysmenorrhea 05/08/2018   Herpes 03/21/2014   Suppurative hidradenitis    Depression     Past Surgical History:  Procedure Laterality Date   CHOLECYSTECTOMY N/A 06/28/2023   Procedure: LAPAROSCOPIC CHOLECYSTECTOMY;  Surgeon: Abigail Miyamoto, MD;  Location: [facility] OR;  Service: General;  Laterality: N/A;  TAP BLOCK   INGUINAL HERNIA REPAIR Left 06/28/2023   Procedure: OPEN LEFT INGUINAL HERNIA REPAIR WITH MESH;  Surgeon: Abigail Miyamoto, MD;  Location: [facility] OR;  Service: General;  Laterality: Left;   oral surgery     PELVIC LAPAROSCOPY  1998/1999 `   pelvic adhesions and pain   TOE SURGERY Left    great toe   WISDOM TOOTH EXTRACTION      OB History     Gravida  2   Para  1   Term      Preterm  1   AB  1   Living  0      SAB  1   IAB      Ectopic      Multiple      Live Births               Home Medications    Prior to Admission medications   Medication Sig Start Date End Date Taking? Authorizing Provider  oxyCODONE-acetaminophen (PERCOCET/ROXICET) 5-325 MG tablet Take 1-2 tablets by mouth every 6 (six) hours as needed for severe pain (pain score 7-10). 10/12/23   Kathryne Hitch, MD  albuterol (VENTOLIN HFA) 108 (90 Base) MCG/ACT inhaler  Inhale 2 puffs into the lungs every 6 (six) hours as needed for wheezing or shortness of breath. 03/08/23   Marcine Matar, MD  amLODipine (NORVASC) 10 MG tablet Take 1 tablet (10 mg total) by mouth daily. 05/11/23   Marcine Matar, MD  atorvastatin (LIPITOR) 10 MG tablet Take 1 tablet (10 mg total) by mouth daily. 07/10/23   Marcine Matar, MD  desonide (DESOWEN) 0.05 % ointment Apply 1 Application topically daily as needed (rash). Patient not taking: Reported on 10/23/2023 03/23/23   [provider]  fluticasone (FLONASE) 50 MCG/ACT nasal spray Place 1 spray into both nostrils daily. Patient taking differently: Place 1 spray into both nostrils daily as needed for allergies. 06/08/22   Claiborne Rigg, NP  gabapentin (NEURONTIN) 100 MG capsule Take 3 capsules (300 mg total) by mouth at bedtime as needed. 06/21/23   Storm Frisk, MD  HYDROcodone-acetaminophen (NORCO/VICODIN) 5-325 MG tablet Take 1 tablet by mouth every 6 (six) hours  as needed for moderate pain or severe pain. Patient not taking: Reported on 10/23/2023 08/08/23   Rodolph Bong, MD  losartan (COZAAR) 25 MG tablet Take 1 tablet (25 mg total) by mouth daily. 07/10/23   Marcine Matar, MD  tiZANidine (ZANAFLEX) 4 MG tablet Take 1 tablet (4 mg total) by mouth every 8 (eight) hours as needed for muscle spasms. 05/24/23   Rodolph Bong, MD    Family History Family History  Problem Relation Age of Onset   Diabetes Mother    Hypertension Mother    Hyperlipidemia Mother    Heart murmur Mother    Crohn's disease Mother    Hypertension Father    Stroke Father    Hyperlipidemia Father    Diabetes Sister    Hypertension Sister    Hyperlipidemia Sister    Hypertension Maternal Grandmother    Diabetes Maternal Grandmother    Osteoporosis Maternal Grandmother    Cirrhosis Maternal Grandfather    Thyroid disease Paternal Aunt    Breast cancer Neg Hx     Social History Social History   Tobacco Use   Smoking status: Former    Current packs/day: 0.00    Types: Cigarettes    Quit date: 1996    Years since quitting: 28.8   Smokeless tobacco: Never   Tobacco comments:    Weed  Vaping Use   Vaping status: Never Used  Substance Use Topics   Alcohol use: Yes    Comment: social   Drug use: Yes    Frequency: 3.0 times per week    Types: Marijuana     Allergies   Codeine   Review of Systems Review of Systems   Physical Exam Triage Vital Signs ED Triage Vitals  Encounter Vitals Group     BP 10/23/23 0943 135/85     Systolic BP Percentile --      Diastolic BP Percentile --      Pulse Rate 10/23/23 0943 62     Resp 10/23/23 0943 16     Temp 10/23/23 0943 98 F (36.7 C)     Temp Source 10/23/23 0943 Oral     SpO2 10/23/23 0943 98 %     Weight --      Height --      Head Circumference --      Peak Flow --      Pain Score 10/23/23 0939 0     Pain Loc --      Pain  Education --      Exclude from [employer] Chart --    No data  found.  Updated Vital Signs BP 135/85 (BP Location: Left Arm)   Pulse 62   Temp 98 F (36.7 C) (Oral)   Resp 16   LMP 09/20/2023 Comment: Pt has irregular periods  SpO2 98%   Visual Acuity Right Eye Distance:   Left Eye Distance:   Bilateral Distance:    Right Eye Near:   Left Eye Near:    Bilateral Near:     Physical Exam Vitals reviewed.  Constitutional:      General: She is not in acute distress.    Appearance: She is not ill-appearing, toxic-appearing or diaphoretic.  Cardiovascular:     Rate and Rhythm: Normal rate and regular rhythm.  Pulmonary:     Effort: Pulmonary effort is normal.     Breath sounds: Normal breath sounds.  Skin:    Coloration: Skin is not pale.  Neurological:     General: No focal deficit present.     Mental Status: She is alert and oriented to person, place, and time.  Psychiatric:        Behavior: Behavior normal.      UC Treatments / Results  Labs (all labs ordered are listed, but only abnormal results are displayed) Labs Reviewed - No data to display  EKG   Radiology No results found.  Procedures Procedures (including critical care time)  Medications Ordered in UC Medications - No data to display  Initial Impression / Assessment and Plan / UC Course  I have reviewed the triage vital signs and the nursing notes.  Pertinent labs & imaging results that were available during my care of the patient were reviewed by me and considered in my medical decision making (see chart for details).     I have asked the patient to proceed to the emergency room for higher level of evaluation and treatment then we provide here in urgent care setting she is agreeable and will go by private car.  Vital signs stable here for  Final Clinical Impressions(s) / UC Diagnoses   Final diagnoses:  Syncope, unspecified syncope type     Discharge Instructions      Patient will proceed to the emergency room for further evaluation and  treatment    ED Prescriptions   None    PDMP not reviewed this encounter.   Zenia Resides, MD 10/23/23 1012

## 2023-10-23 NOTE — ED Provider Notes (Signed)
[place] EMERGENCY DEPARTMENT AT [facility] Provider Note   CSN: [id number] Arrival date & time: 10/23/23  1130     History {Add pertinent medical, surgical, social history, OB history to HPI:1} Chief Complaint  Patient presents with   Loss of Consciousness    Friday     Misty Bailey is a 52 y.o. female.  52 year old female with a history of panic attacks, diabetes, and hypertension who presents to the emergency department after syncopal event.  Patient reports that on Friday night she was sitting at home when she started to feel like both of her arms were very heavy and she was starting to have a panic attack.  Started to feel short of breath and was getting very nauseous.  Says that she was not drinking that night but did go over to a bottle of alcohol to off that since that occasionally helps with her nausea.  Says that she then woke up on the ground and felt that her whole body was heavy and passed out again a second time.  Her boyfriend came over and watched her over the weekend.  Says that she still feels generally weak but is not having any chest pain or shortness of breath at this time.  Did have a shoulder lysis of adhesions on 10/12/23.  Says that she does not have any personal or family history of arrhythmia.  Did not take her Cozaar and her amlodipine yet today.       Home Medications Prior to Admission medications   Medication Sig Start Date End Date Taking? Authorizing Provider  oxyCODONE-acetaminophen (PERCOCET/ROXICET) 5-325 MG tablet Take 1-2 tablets by mouth every 6 (six) hours as needed for severe pain (pain score 7-10). 10/12/23   Kathryne Hitch, MD  albuterol (VENTOLIN HFA) 108 (90 Base) MCG/ACT inhaler Inhale 2 puffs into the lungs every 6 (six) hours as needed for wheezing or shortness of breath. 03/08/23   Marcine Matar, MD  amLODipine (NORVASC) 10 MG tablet Take 1 tablet (10 mg total) by mouth daily. 05/11/23   Marcine Matar, MD   atorvastatin (LIPITOR) 10 MG tablet Take 1 tablet (10 mg total) by mouth daily. 07/10/23   Marcine Matar, MD  desonide (DESOWEN) 0.05 % ointment Apply 1 Application topically daily as needed (rash). Patient not taking: Reported on 10/23/2023 03/23/23   [provider]  fluticasone (FLONASE) 50 MCG/ACT nasal spray Place 1 spray into both nostrils daily. Patient taking differently: Place 1 spray into both nostrils daily as needed for allergies. 06/08/22   Claiborne Rigg, NP  gabapentin (NEURONTIN) 100 MG capsule Take 3 capsules (300 mg total) by mouth at bedtime as needed. 06/21/23   Storm Frisk, MD  HYDROcodone-acetaminophen (NORCO/VICODIN) 5-325 MG tablet Take 1 tablet by mouth every 6 (six) hours as needed for moderate pain or severe pain. Patient not taking: Reported on 10/23/2023 08/08/23   Rodolph Bong, MD  losartan (COZAAR) 25 MG tablet Take 1 tablet (25 mg total) by mouth daily. 07/10/23   Marcine Matar, MD  tiZANidine (ZANAFLEX) 4 MG tablet Take 1 tablet (4 mg total) by mouth every 8 (eight) hours as needed for muscle spasms. 05/24/23   Rodolph Bong, MD      Allergies    Codeine    Review of Systems   Review of Systems  Physical Exam Updated Vital Signs BP (!) 171/92 (BP Location: Left Arm)   Pulse 74   Temp 98.1 F (36.7  C)   Resp 16   Ht 5\' 1"  (1.549 m)   Wt 64.4 kg   LMP 09/20/2023 Comment: Pt has irregular periods  SpO2 100%   BMI 26.83 kg/m  Physical Exam Vitals and nursing note reviewed.  Constitutional:      General: She is not in acute distress.    Appearance: She is well-developed.  HENT:     Head: Normocephalic and atraumatic.     Right Ear: External ear normal.     Left Ear: External ear normal.     Nose: Nose normal.  Eyes:     Extraocular Movements: Extraocular movements intact.     Conjunctiva/sclera: Conjunctivae normal.     Pupils: Pupils are equal, round, and reactive to light.  Cardiovascular:     Rate and Rhythm: Normal  rate and regular rhythm.     Heart sounds: No murmur heard. Pulmonary:     Effort: Pulmonary effort is normal. No respiratory distress.     Breath sounds: Normal breath sounds.  Abdominal:     General: Abdomen is flat. There is no distension.     Palpations: Abdomen is soft. There is no mass.     Tenderness: There is no abdominal tenderness. There is no guarding.  Musculoskeletal:     Cervical back: Normal range of motion and neck supple.     Right lower leg: No edema.     Left lower leg: No edema.  Skin:    General: Skin is warm and dry.  Neurological:     Mental Status: She is alert and oriented to person, place, and time. Mental status is at baseline.  Psychiatric:        Mood and Affect: Mood normal.     ED Results / Procedures / Treatments   Labs (all labs ordered are listed, but only abnormal results are displayed) Labs Reviewed  BASIC METABOLIC PANEL - Abnormal; Notable for the following components:      Result Value   Glucose, Bld 107 (*)    All other components within normal limits  CBC  URINALYSIS, ROUTINE W REFLEX MICROSCOPIC  PREGNANCY, URINE  CBG MONITORING, ED    EKG EKG Interpretation Date/Time:  Monday October 23 2023 11:39:43 EDT Ventricular Rate:  74 PR Interval:  148 QRS Duration:  70 QT Interval:  376 QTC Calculation: 417 R Axis:   52  Text Interpretation: Normal sinus rhythm Normal ECG When compared with ECG of 21-Jun-2023 13:42, No significant change was found Confirmed by Vonita Moss [phone]) on 10/23/2023 11:54:56 AM  Radiology No results found.  Procedures Procedures  {Document cardiac monitor, telemetry assessment procedure when appropriate:1}  Medications Ordered in ED Medications - No data to display  ED Course/ Medical Decision Making/ A&P   {   Click here for ABCD2, HEART and other calculatorsREFRESH Note before signing :1}                              Medical Decision Making Amount and/or Complexity of Data  Reviewed Labs: ordered.   ***  {Document critical care time when appropriate:1} {Document review of labs and clinical decision tools ie heart score, Chads2Vasc2 etc:1}  {Document your independent review of radiology images, and any outside records:1} {Document your discussion with family members, caretakers, and with consultants:1} {Document social determinants of health affecting pt's care:1} {Document your decision making why or why not admission, treatments were needed:1} Final Clinical Impression(s) / ED  Diagnoses Final diagnoses:  None    Rx / DC Orders ED Discharge Orders     None

## 2023-10-23 NOTE — ED Notes (Signed)
Patient is being discharged from the Urgent Care and sent to the Emergency Department via self . Per provider, patient is in need of higher level of care due to syncope. Patient is aware and verbalizes understanding of plan of care.  Vitals:   10/23/23 0943  BP: 135/85  Pulse: 62  Resp: 16  Temp: 98 F (36.7 C)  SpO2: 98%

## 2023-10-23 NOTE — Telephone Encounter (Signed)
I just noticed that pt was sent to the ED today for Syncope and collapse (FYI).

## 2023-10-23 NOTE — ED Notes (Signed)
Gave patient urine cup.

## 2023-10-23 NOTE — ED Triage Notes (Signed)
Pt passed out twice Friday evening and woke up on the floor. Does not how long she was out  She states before she felt tingling in both arms, limbs heaving, sob and nausea like having a panic attack. She had shoulder earlier this month.    Pt has an appointment in Nov. with PCP

## 2023-10-23 NOTE — Discharge Instructions (Addendum)
Patient will proceed to the emergency room for further evaluation and treatment

## 2023-10-25 NOTE — Telephone Encounter (Signed)
Forms completed and placed on Dr. Zollie Pee desk to review and sign.

## 2023-10-25 NOTE — Telephone Encounter (Signed)
Forms reviewed and signed by Dr. [name].   Forms faxed and placed at the front desk to scan to pt chart.

## 2023-10-26 ENCOUNTER — Ambulatory Visit: Payer: No Typology Code available for payment source | Attending: Internal Medicine | Admitting: Internal Medicine

## 2023-10-26 ENCOUNTER — Telehealth: Payer: Self-pay

## 2023-10-26 VITALS — BP 130/80 | HR 83 | Temp 98.6°F | Ht 61.0 in | Wt 146.0 lb

## 2023-10-26 DIAGNOSIS — R55 Syncope and collapse: Secondary | ICD-10-CM

## 2023-10-26 DIAGNOSIS — E538 Deficiency of other specified B group vitamins: Secondary | ICD-10-CM | POA: Diagnosis not present

## 2023-10-26 NOTE — Telephone Encounter (Signed)
Patient in the office visit today. Patient had been d/c and voiced that she forgot to tell her provider that the incisions on her right shoulder were irritate. Patient had 3 large Band-Aids to healed suture sites. Advised patient that skin is broken and they are healed. Advised that the irritation is coming from the band-aids. Patient voiced that she had them there because she wanted to keep the area clean. Advised patient to keep band-aids off and when she shower she could use a mild soap to cleanse the areas as there is not broken areas noted.  Advised that if she continues to feel the irritation contact  Doneen Poisson, MD (Orthopedic Surgery) . Patient voiced understanding and was agreeable to contact Orthopedic Surgery if needed.

## 2023-10-26 NOTE — Progress Notes (Signed)
Patient ID: Misty Bailey, female    DOB: [birth year]  MRN: [id number]  CC: Loss of Consciousness (Tingling / numbness of arms, SOB, lightheaded, syncope episodes on Friday 10/20/23 - no syncope episodes since - Reports previous syncope was July of 2022/Discuss gabapentin/No to flu & shingles vax)   Subjective: Misty Bailey is a 52 y.o. female who presents for UC visit. Her concerns today include:  Dysmenorrhea, uterine fibroid, diverticulosis, HL, HTN, hidradenitis, bronchospasm.   Patient presents for ER follow-up.  Seen in the ER 3 days ago post syncopal episode.  Reported that 2 days prior to being seen she was sitting at home on the couch watching TV she felt tingling and heaviness in both arms.  Felt like she was about to have a panic attack.  She felt short of breath and dizzy.  She went in her room and sat on the bed.  Nauseated.  She went into the bathroom to get some rubbing alcohol and the next thing she knew she woke up on.  She tried to sit up dizzy.  She thinks she passed out again.  When she came to grandson so that she was able to call her boyfriend she spent the next 2 days in bed resting. In the ER blood pressure was elevated vitals were stable EKG concerning changes.  CBC and chemistry normal.  UA negative.  Discharged home in stable condition.  Today: He has not had any recurrent episodes since then.  She tells me that she was probably not drinking with fluids after shoulder surgery on 10/12/2023.  She is on oxycodone but states she had not taken it days prior to the fainting episode.  Hx of B12 def.  Taking B12 supplement OTC.  Thinks she is on the 1000 mcg.      Patient Active Problem List   Diagnosis Date Noted   Neuropathy 06/22/2023   Influenza vaccine refused 01/04/2021   Inguinal hernia of left side without obstruction or gangrene 04/30/2020   Hyperlipidemia 07/30/2019   Syncope 07/29/2019   Essential hypertension 07/29/2019   Dysmenorrhea  05/08/2018   Herpes 03/21/2014   Suppurative hidradenitis    Depression      Current Outpatient Medications on File Prior to Visit  Medication Sig Dispense Refill   albuterol (VENTOLIN HFA) 108 (90 Base) MCG/ACT inhaler Inhale 2 puffs into the lungs every 6 (six) hours as needed for wheezing or shortness of breath. 8 g 4   amLODipine (NORVASC) 10 MG tablet Take 1 tablet (10 mg total) by mouth daily. 90 tablet 1   atorvastatin (LIPITOR) 10 MG tablet Take 1 tablet (10 mg total) by mouth daily. 90 tablet 1   desonide (DESOWEN) 0.05 % ointment Apply 1 Application topically daily as needed (rash).     fluticasone (FLONASE) 50 MCG/ACT nasal spray Place 1 spray into both nostrils daily. (Patient taking differently: Place 1 spray into both nostrils daily as needed for allergies.) 16 g 1   losartan (COZAAR) 25 MG tablet Take 1 tablet (25 mg total) by mouth daily. 90 tablet 1   oxyCODONE-acetaminophen (PERCOCET/ROXICET) 5-325 MG tablet Take 1-2 tablets by mouth every 6 (six) hours as needed for severe pain (pain score 7-10). 30 tablet 0   gabapentin (NEURONTIN) 100 MG capsule Take 3 capsules (300 mg total) by mouth at bedtime as needed. (Patient not taking: Reported on 10/26/2023) 180 capsule 3   tiZANidine (ZANAFLEX) 4 MG tablet Take 1 tablet (4 mg total) by mouth every 8 (  eight) hours as needed for muscle spasms. (Patient not taking: Reported on 10/26/2023) 30 tablet 1   Current Facility-Administered Medications on File Prior to Visit  Medication Dose Route Frequency Provider Last Rate Last Admin   cyanocobalamin (VITAMIN B12) injection 1,000 mcg  1,000 mcg Intramuscular Once         Allergies  Allergen Reactions   Codeine Shortness Of Breath    Social History   Socioeconomic History   Marital status: Single    Spouse name: Not on file   Number of children: 0   Years of education: Associates   Highest education level: Associate degree: occupational, Scientist, product/process development, or vocational program   Occupational History   Not on file  Tobacco Use   Smoking status: Former    Current packs/day: 0.00    Types: Cigarettes    Quit date: 1996    Years since quitting: 28.8   Smokeless tobacco: Never   Tobacco comments:    Weed  Advertising account planner   Vaping status: Never Used  Substance and Sexual Activity   Alcohol use: Yes    Comment: social   Drug use: Yes    Frequency: 3.0 times per week    Types: Marijuana   Sexual activity: Not Currently    Partners: Male    Birth control/protection: None  Other Topics Concern   Not on file  Social History Narrative   Boyfriend incarcerated 2014-2018   Social Determinants of Health   Financial Resource Strain: Low Risk  (10/26/2023)   Overall Financial Resource Strain (CARDIA)    Difficulty of Paying Living Expenses: Not very hard  Food Insecurity: Food Insecurity Present (10/26/2023)   Hunger Vital Sign    Worried About Running Out of Food in the Last Year: Sometimes true    Ran Out of Food in the Last Year: Sometimes true  Transportation Needs: No Transportation Needs (10/26/2023)   PRAPARE - Administrator, Civil Service (Medical): No    Lack of Transportation (Non-Medical): No  Physical Activity: Insufficiently Active (10/26/2023)   Exercise Vital Sign    Days of Exercise per Week: 3 days    Minutes of Exercise per Session: 20 min  Stress: Stress Concern Present (10/26/2023)   [employer] of Occupational Health - Occupational Stress Questionnaire    Feeling of Stress : Very much  Social Connections: Moderately Isolated (10/26/2023)   Social Connection and Isolation Panel [NHANES]    Frequency of Communication with Friends and Family: More than three times a week    Frequency of Social Gatherings with Friends and Family: More than three times a week    Attends Religious Services: More than 4 times per year    Active Member of [employer] or Organizations: No    Attends Engineer, structural: Not on file     Marital Status: Never married  Intimate Partner Violence: Unknown (09/05/2022)   Received from [employer], [facility]   HITS    Physically Hurt: Not on file    Insult or Talk Down To: Not on file    Threaten Physical Harm: Not on file    Scream or Curse: Not on file    Family History  Problem Relation Age of Onset   Diabetes Mother    Hypertension Mother    Hyperlipidemia Mother    Heart murmur Mother    Crohn's disease Mother    Hypertension Father    Stroke Father    Hyperlipidemia Father    Diabetes  Sister    Hypertension Sister    Hyperlipidemia Sister    Hypertension Maternal Grandmother    Diabetes Maternal Grandmother    Osteoporosis Maternal Grandmother    Cirrhosis Maternal Grandfather    Thyroid disease Paternal Aunt    Breast cancer Neg Hx     Past Surgical History:  Procedure Laterality Date   CHOLECYSTECTOMY N/A 06/28/2023   Procedure: LAPAROSCOPIC CHOLECYSTECTOMY;  Surgeon: Abigail Miyamoto, MD;  Location: [facility] OR;  Service: General;  Laterality: N/A;  TAP BLOCK   INGUINAL HERNIA REPAIR Left 06/28/2023   Procedure: OPEN LEFT INGUINAL HERNIA REPAIR WITH MESH;  Surgeon: Abigail Miyamoto, MD;  Location: [facility] OR;  Service: General;  Laterality: Left;   oral surgery     PELVIC LAPAROSCOPY  1998/1999 `   pelvic adhesions and pain   TOE SURGERY Left    great toe   WISDOM TOOTH EXTRACTION      ROS: Review of Systems Negative except as stated above  PHYSICAL EXAM: BP 130/80   Pulse 83   Temp 98.6 F (37 C) (Oral)   Ht 5\' 1"  (1.549 m)   Wt 146 lb (66.2 kg)   LMP 09/20/2023 Comment: Pt has irregular periods  SpO2 99%   BMI 27.59 kg/m   Physical Exam  General appearance - alert, well appearing, and in no distress Mental status - normal mood, behavior, speech, dress, motor activity, and thought processes Mouth - mucous membranes moist, pharynx normal without lesions Neck - supple, no significant adenopathy Chest - clear to auscultation, no wheezes,  rales or rhonchi, symmetric air entry Heart - normal rate, regular rhythm, normal S1, S2, no murmurs, rubs, clicks or gallops Neurological - cranial nerves II through XII intact, motor and sensory grossly normal bilaterally except she gives to pain in the RUE proximally.      Latest Ref Rng & Units 10/23/2023   11:46 AM 06/21/2023    2:00 PM 05/11/2023   12:09 PM  CMP  Glucose 70 - 99 mg/dL 960  454  098   BUN 6 - 20 mg/dL 15  15  17    Creatinine 0.44 - 1.00 mg/dL 1.19  1.47  8.29   Sodium 135 - 145 mmol/L 140  140  143   Potassium 3.5 - 5.1 mmol/L 3.8  3.6  4.2   Chloride 98 - 111 mmol/L 106  103  108   CO2 22 - 32 mmol/L 27  24  21    Calcium 8.9 - 10.3 mg/dL 9.6  9.7  9.4    Lipid Panel     Component Value Date/Time   CHOL 213 (H) 02/03/2021 1005   TRIG 78 02/03/2021 1005   HDL 56 02/03/2021 1005   CHOLHDL 3.8 02/03/2021 1005   LDLCALC 143 (H) 02/03/2021 1005    CBC    Component Value Date/Time   WBC 6.1 10/23/2023 1146   RBC 4.72 10/23/2023 1146   HGB 13.5 10/23/2023 1146   HGB 13.6 08/17/2021 1037   HCT 42.2 10/23/2023 1146   HCT 42.3 08/17/2021 1037   PLT 277 10/23/2023 1146   PLT 312 08/17/2021 1037   MCV 89.4 10/23/2023 1146   MCV 89 08/17/2021 1037   MCH 28.6 10/23/2023 1146   MCHC 32.0 10/23/2023 1146   RDW 14.0 10/23/2023 1146   RDW 12.6 08/17/2021 1037   LYMPHSABS 2.7 01/25/2018 1106   MONOABS 0.5 08/09/2013 1045   EOSABS 0.2 01/25/2018 1106   BASOSABS 0.0 01/25/2018 1106    ASSESSMENT  AND PLAN:  1. Vasovagal syncope This sounds vasovagal may be due to the fact that she was not drinking adequate fluids after surgery on her shoulder Advised patient to stay well-hydrated.  May seated or lying down when she feels dizzy.  2. Vitamin B 12 deficiency - Vitamin B12           There are no diagnoses linked to this encounter.   Patient was given the opportunity to ask questions.  Patient verbalized understanding of the plan and was able to repeat  key elements of the plan.   This documentation was completed using Paediatric nurse.  Any transcriptional errors are unintentional.  No orders of the defined types were placed in this encounter.    Requested Prescriptions    No prescriptions requested or ordered in this encounter    No follow-ups on file.  Jonah Blue, MD, FACP

## 2023-10-27 ENCOUNTER — Telehealth: Payer: Self-pay | Admitting: Internal Medicine

## 2023-10-27 DIAGNOSIS — E538 Deficiency of other specified B group vitamins: Secondary | ICD-10-CM

## 2023-10-27 LAB — VITAMIN B12: Vitamin B-12: 238 pg/mL (ref 232–1245)

## 2023-10-27 MED ORDER — CYANOCOBALAMIN 1000 MCG/ML IJ SOLN
1000.0000 ug | Freq: Once | INTRAMUSCULAR | Status: DC
Start: 2023-10-30 — End: 2023-11-10

## 2023-10-27 NOTE — Telephone Encounter (Signed)
Patient is aware . Vit B 12 injection scheduled

## 2023-10-27 NOTE — Telephone Encounter (Signed)
Let patient know that vitamin B12 level is still low. Please arrange for her to come as a nurse only visit to get a B12 injection once every 2 weeks for 1 month.  After that she should purchase vitamin B12 1000 mcg over-the-counter and take 1 tablet daily.

## 2023-10-27 NOTE — Telephone Encounter (Signed)
Noted  

## 2023-10-30 ENCOUNTER — Ambulatory Visit: Payer: No Typology Code available for payment source | Attending: Internal Medicine

## 2023-10-30 DIAGNOSIS — E538 Deficiency of other specified B group vitamins: Secondary | ICD-10-CM

## 2023-10-30 MED ORDER — CYANOCOBALAMIN 1000 MCG/ML IJ SOLN
1000.0000 ug | Freq: Once | INTRAMUSCULAR | Status: AC
Start: 2023-10-30 — End: 2023-10-30
  Administered 2023-10-30: 1000 ug via INTRAMUSCULAR

## 2023-10-30 NOTE — Addendum Note (Signed)
Addended by: Elsie Lincoln F on: 10/30/2023 10:47 AM   Modules accepted: Level of Service

## 2023-10-30 NOTE — Progress Notes (Signed)
cyanocobalamin (VITAMIN B12) injection 1,000 mcg  administered in right deltoid per protocols. Patient denies and pain or discomfort at injection site. Tolerated injection well no reaction.

## 2023-10-31 ENCOUNTER — Ambulatory Visit: Payer: No Typology Code available for payment source | Admitting: Physical Therapy

## 2023-10-31 ENCOUNTER — Encounter: Payer: Self-pay | Admitting: Physical Therapy

## 2023-10-31 DIAGNOSIS — R600 Localized edema: Secondary | ICD-10-CM

## 2023-10-31 DIAGNOSIS — M6281 Muscle weakness (generalized): Secondary | ICD-10-CM | POA: Diagnosis not present

## 2023-10-31 DIAGNOSIS — M25511 Pain in right shoulder: Secondary | ICD-10-CM | POA: Diagnosis not present

## 2023-10-31 NOTE — Therapy (Addendum)
OUTPATIENT PHYSICAL THERAPY SHOULDER EVALUATION   Patient Name: Misty Bailey MRN: [id number] DOB:March 19, 1971, 52 y.o., female Today's Date: 10/31/2023  END OF SESSION:  PT End of Session - 10/31/23 1256     Visit Number 1    Number of Visits 20    Date for PT Re-Evaluation 01/12/24    PT Start Time 1015    PT Stop Time 1100    PT Time Calculation (min) 45 min    Activity Tolerance Patient tolerated treatment well;Patient limited by pain    Behavior During Therapy [facility] for tasks assessed/performed             Past Medical History:  Diagnosis Date   Anemia    Anxiety    Arthritis 02/23/2014   Rib Cage   Asthma    Depression    Diverticulosis    Dysmenorrhea 05/08/2018   Essential hypertension 07/29/2019   HLD (hyperlipidemia)    Panic attacks    Pre-diabetes    Suppurative hidradenitis    Past Surgical History:  Procedure Laterality Date   CHOLECYSTECTOMY N/A 06/28/2023   Procedure: LAPAROSCOPIC CHOLECYSTECTOMY;  Surgeon: Abigail Miyamoto, MD;  Location: [facility] OR;  Service: General;  Laterality: N/A;  TAP BLOCK   INGUINAL HERNIA REPAIR Left 06/28/2023   Procedure: OPEN LEFT INGUINAL HERNIA REPAIR WITH MESH;  Surgeon: Abigail Miyamoto, MD;  Location[facility];  Service: General;  Laterality: Left;   oral surgery     PELVIC LAPAROSCOPY  1998/1999 `   pelvic adhesions and pain   TOE SURGERY Left    great toe   WISDOM TOOTH EXTRACTION     Patient Active Problem List   Diagnosis Date Noted   Neuropathy 06/22/2023   Influenza vaccine refused 01/04/2021   Inguinal hernia of left side without obstruction or gangrene 04/30/2020   Hyperlipidemia 07/30/2019   Syncope 07/29/2019   Essential hypertension 07/29/2019   Dysmenorrhea 05/08/2018   Herpes 03/21/2014   Suppurative hidradenitis    Depression     PCP: Marcine Matar, MD   REFERRING PROVIDER: Kathryne Hitch*   REFERRING DIAG:  Diagnosis  M24.611 (ICD-10-CM) - Arthrofibrosis of right  shoulder  Z98.890 (ICD-10-CM) - Status post arthroscopy of right shoulder    THERAPY DIAG:  Acute pain of right shoulder  Muscle weakness (generalized)  Localized edema  Rationale for Evaluation and Treatment: Rehabilitation  ONSET DATE: February 2024  SUBJECTIVE:                                                                                                                                                                                      SUBJECTIVE STATEMENT: Pt s/p manipulation  and debridement per pt report on 10/12/23.   PERTINENT HISTORY: At this point I recommended a manipulation under anesthesia followed by an arthroscopic intervention with lysis of adhesions and a subacromial decompression.  Pt s/p on 10/12/23 manipulation c debridement of anterior capsule,  inferior capsule and subacrominal space  PMH: anemia, anxiety, asthma, depression, diverticulosis, essential HTN, panic attacks, pre-diabetes, hernia repair, oral surgery, toe surgery, pelvic laparoscopy, cholecystectomy   PAIN:  NPRS scale: 5/10 and worse with movement Pain location: Rt shoulder Pain description: achy, sore, sharp at times Aggravating factors: lifting my arm, ADL's Relieving factors: resting, pain meds as needed  PRECAUTIONS: None  WEIGHT BEARING RESTRICTIONS: No  FALLS:  Has patient fallen in last 6 months? Yes. Number of falls pt stating no injuries, pt stating she was dehydrated.   LIVING ENVIRONMENT: Lives with: lives with their family and lives alone Lives in: House/apartment Stairs: No Has following equipment at home: None  OCCUPATION: Out of work since May  PLOF: Independent  PATIENT GOALS: be able reach overhead  Next MD visit:   OBJECTIVE:   DIAGNOSTIC FINDINGS:  IMPRESSION: 1. Mild tendinosis of the supraspinatus tendon anteriorly. 2. Mild subacromial/subdeltoid bursitis. 3. Mild relative thickening of the inferior joint capsule as can be seen with adhesive  capsulitis.  PATIENT SURVEYS:  10/31/23: FOTO intake:  41%     COGNITION: Overall cognitive status: WFL     SENSATION: Tingling in Rt fingers, pt feels that may be due to B-12 deficiency  POSTURE: Forward head and rounded shoulders  UPPER EXTREMITY ROM:   ROM Right 10/31/23 Left eval  Shoulder flexion 60 155  Shoulder extension 15 40  Shoulder abduction 65 162  Shoulder adduction    Shoulder internal rotation 50 75  Shoulder external rotation 25 75  Elbow flexion    Elbow extension    Wrist flexion    Wrist extension    Wrist ulnar deviation    Wrist radial deviation    Wrist pronation    Wrist supination    (Blank rows = not tested)  UPPER EXTREMITY MMT:  MMT Right eval Left eval  Shoulder flexion 2 5  Shoulder extension 3 5  Shoulder abduction 2 5  Shoulder adduction    Shoulder internal rotation 2 5  Shoulder external rotation 2 5  Middle trapezius    Lower trapezius    Elbow flexion    Elbow extension    Wrist flexion    Wrist extension    Wrist ulnar deviation    Wrist radial deviation    Wrist pronation    Wrist supination    Grip strength (lbs)    (Blank rows = not tested)    PALPATION:  TTP: Rt upper trap, Rt bicep tendons. Rt deltoid, Rt supraspinatus.  TODAY'S TREATMENT:                                                                                                       DATE: 10/31/23:  Therex:    HEP instruction/performance c cues for techniques, handout provided.  Trial set performed of each for comprehension and symptom assessment.  See below for exercise list   PATIENT EDUCATION: Education details: HEP, POC Person educated: Patient Education method: Explanation, Demonstration, Verbal cues, and Handouts Education comprehension: verbalized  understanding, returned demonstration, and verbal cues required  HOME EXERCISE PROGRAM: Access Code: [email or web address] URL: [email or web address] Date: 10/31/2023 Prepared by: Narda Amber  Exercises - Standing Shoulder Row with Anchored Resistance  - 2-3 x daily - 7 x weekly - 10 reps - 3 seconds hold - Supine Shoulder Flexion Extension AAROM with Dowel  - 2-3 x daily - 7 x weekly - 2 sets - 10 reps - 2 seconds hold - Supine Shoulder External Rotation in 45 Degrees Abduction AAROM with Dowel  - 2-3 x daily - 7 x weekly - 2 sets - 10 reps - 2 seconds hold - Seated Bilateral Shoulder Flexion Towel Slide at Table Top  - 2-3 x daily - 7 x weekly - 10 reps - 2 seconds hold - Seated Shoulder External Rotation PROM on Table  - 2-3 x daily - 7 x weekly - 10 reps - 2 seconds hold  ASSESSMENT:  CLINICAL IMPRESSION: Patient is a 52 y.o. who comes to clinic with complaints of Rt shoulder pain s/p manipulation and debridement. Pt presents with mobility, strength and movement coordination deficits that impair their ability to perform usual daily and recreational functional activities without increase difficulty/symptoms at this time.  Patient to benefit from skilled PT services to address impairments and limitations to improve to previous level of function without restriction secondary to condition.   OBJECTIVE IMPAIRMENTS: decreased ROM, decreased strength, increased edema, impaired UE functional use, postural dysfunction, and pain.   ACTIVITY LIMITATIONS: carrying, sleeping, bathing, toileting, dressing, reach over head, and hygiene/grooming  PARTICIPATION LIMITATIONS: meal prep, cleaning, laundry, driving, shopping, community activity, and occupation  PERSONAL FACTORS: 3+ comorbidities: see pertinent history  are also affecting patient's functional outcome.   REHAB POTENTIAL: Good  CLINICAL DECISION MAKING: Stable/uncomplicated  EVALUATION COMPLEXITY: Low   GOALS: Goals reviewed  with patient? Yes  SHORT TERM GOALS: (target date for Short term goals are 3 weeks 11/24/23)  1.Patient will demonstrate independent use of home exercise program to maintain progress from in clinic treatments. Goal status: New  LONG TERM GOALS: (target dates for all long term goals are 10 weeks  01/11/23 )   1. Patient will demonstrate/report pain at worst less than or equal to 2/10 to facilitate minimal limitation in daily activity secondary to pain symptoms. Goal status: New   2. Patient will demonstrate independent use of home exercise program to facilitate ability to maintain/progress functional gains from skilled physical therapy services. Goal status: New   3. Patient will demonstrate FOTO outcome > or = 61 % to indicate reduced disability due to condition.  Goal status: New   4.  Patient will demonstrate Rt UE MMT >/= 4/5 throughout to facilitate lifting, reaching, carrying at [facility] in daily activity.   Goal status: New   5.  Patient will demonstrate Rt GH joint AROM WFL s symptoms to facilitate usual overhead reaching, self care, dressing at PLOF.    Goal status: New   6.  Pt will improve her Rt shoulder flexion to >/= 140 degrees for improvements in ADL's.  Goal status: New  7. Pt will improve her Rt shoulder ER to >/= 60 degrees in order to improved functional actives.   Goal Status: New     PLAN:  PT FREQUENCY: 1-2x/week  PT DURATION: 10 weeks  PLANNED INTERVENTIONS: Can include [id number]- PT Re-evaluation, 97110-Therapeutic exercises, 97530- Therapeutic activity, [id number]- Neuromuscular re-education, 97535- Self Care, 97140- Manual therapy, [phone]- Gait training, [phone]- Orthotic Fit/training, [phone]- Canalith repositioning, [id number]- Aquatic Therapy, 97014- Electrical stimulation (unattended), [id number]- Electrical stimulation (manual), [id number]- Vasopneumatic device, [id number]- Ultrasound, [id number]- Traction (mechanical), [id number]- Ionotophoresis 4mg /ml Dexamethasone, Patient/Family education,  Balance training, Stair training, Taping, Dry Needling, Joint mobilization, Joint manipulation, Spinal manipulation, Spinal mobilization, Scar mobilization, Vestibular training, Visual/preceptual remediation/compensation, DME instructions, Cryotherapy, and Moist heat.  All performed as medically necessary.  All included unless contraindicated  PLAN FOR NEXT SESSION: Review HEP knowledge/results, shoulder ROM           Sharmon Leyden, PT, MPT 10/31/2023, 12:57 PM

## 2023-11-02 ENCOUNTER — Encounter: Payer: Self-pay | Admitting: Physical Therapy

## 2023-11-02 ENCOUNTER — Ambulatory Visit: Payer: No Typology Code available for payment source | Admitting: Physical Therapy

## 2023-11-02 DIAGNOSIS — M25511 Pain in right shoulder: Secondary | ICD-10-CM

## 2023-11-02 DIAGNOSIS — R600 Localized edema: Secondary | ICD-10-CM | POA: Diagnosis not present

## 2023-11-02 DIAGNOSIS — M6281 Muscle weakness (generalized): Secondary | ICD-10-CM | POA: Diagnosis not present

## 2023-11-02 NOTE — Therapy (Addendum)
OUTPATIENT PHYSICAL THERAPY TREATMENT   Patient Name: Misty Bailey MRN: [id number] DOB:05-29-1971, 52 y.o., female Today's Date: 11/02/2023  END OF SESSION:  PT End of Session - 11/02/23 0851     Visit Number 2    Number of Visits 20    Date for PT Re-Evaluation 01/12/24    PT Start Time 0848    PT Stop Time 0927    PT Time Calculation (min) 39 min    Activity Tolerance Patient tolerated treatment well;Patient limited by pain    Behavior During Therapy [facility] for tasks assessed/performed              Past Medical History:  Diagnosis Date   Anemia    Anxiety    Arthritis 02/23/2014   Rib Cage   Asthma    Depression    Diverticulosis    Dysmenorrhea 05/08/2018   Essential hypertension 07/29/2019   HLD (hyperlipidemia)    Panic attacks    Pre-diabetes    Suppurative hidradenitis    Past Surgical History:  Procedure Laterality Date   CHOLECYSTECTOMY N/A 06/28/2023   Procedure: LAPAROSCOPIC CHOLECYSTECTOMY;  Surgeon: Abigail Miyamoto, MD;  Location: [facility] OR;  Service: General;  Laterality: N/A;  TAP BLOCK   INGUINAL HERNIA REPAIR Left 06/28/2023   Procedure: OPEN LEFT INGUINAL HERNIA REPAIR WITH MESH;  Surgeon: Abigail Miyamoto, MD;  Location: [facility] OR;  Service: General;  Laterality: Left;   oral surgery     PELVIC LAPAROSCOPY  1998/1999 `   pelvic adhesions and pain   TOE SURGERY Left    great toe   WISDOM TOOTH EXTRACTION     Patient Active Problem List   Diagnosis Date Noted   Neuropathy 06/22/2023   Influenza vaccine refused 01/04/2021   Inguinal hernia of left side without obstruction or gangrene 04/30/2020   Hyperlipidemia 07/30/2019   Syncope 07/29/2019   Essential hypertension 07/29/2019   Dysmenorrhea 05/08/2018   Herpes 03/21/2014   Suppurative hidradenitis    Depression     PCP: Marcine Matar, MD   REFERRING PROVIDER: Kathryne Hitch*   REFERRING DIAG:  Diagnosis  M24.611 (ICD-10-CM) - Arthrofibrosis of right shoulder   Z98.890 (ICD-10-CM) - Status post arthroscopy of right shoulder    THERAPY DIAG:  Acute pain of right shoulder  Muscle weakness (generalized)  Localized edema  Rationale for Evaluation and Treatment: Rehabilitation  ONSET DATE: February 2024  SUBJECTIVE:                                                                                                                                                                                      SUBJECTIVE STATEMENT: Pain is always  high, about a 7/10 today and worse at night  PERTINENT HISTORY: At this point I recommended a manipulation under anesthesia followed by an arthroscopic intervention with lysis of adhesions and a subacromial decompression.  Pt s/p on 10/12/23 manipulation c debridement of anterior capsule,  inferior capsule and subacrominal space  PMH: anemia, anxiety, asthma, depression, diverticulosis, essential HTN, panic attacks, pre-diabetes, hernia repair, oral surgery, toe surgery, pelvic laparoscopy, cholecystectomy   PAIN:  NPRS scale: 7/10 and worse with movement Pain location: Rt shoulder Pain description: achy, sore, sharp at times Aggravating factors: lifting my arm, ADL's Relieving factors: resting, pain meds as needed  PRECAUTIONS: None  WEIGHT BEARING RESTRICTIONS: No  FALLS:  Has patient fallen in last 6 months? Yes. Number of falls pt stating no injuries, pt stating she was dehydrated.   LIVING ENVIRONMENT: Lives with: lives with their family and lives alone Lives in: House/apartment Stairs: No Has following equipment at home: None  OCCUPATION: Out of work since May  PLOF: Independent  PATIENT GOALS: be able reach overhead  Next MD visit:   OBJECTIVE:   DIAGNOSTIC FINDINGS:  IMPRESSION: 1. Mild tendinosis of the supraspinatus tendon anteriorly. 2. Mild subacromial/subdeltoid bursitis. 3. Mild relative thickening of the inferior joint capsule as can be seen with adhesive  capsulitis.  PATIENT SURVEYS:  10/31/23: FOTO intake:  41%     COGNITION: Overall cognitive status: WFL     SENSATION: Tingling in Rt fingers, pt feels that may be due to B-12 deficiency  POSTURE: Forward head and rounded shoulders  UPPER EXTREMITY ROM:   ROM Right 10/31/23 Left eval  Shoulder flexion 60 155  Shoulder extension 15 40  Shoulder abduction 65 162  Shoulder adduction    Shoulder internal rotation 50 75  Shoulder external rotation 25 75  (Blank rows = not tested)  UPPER EXTREMITY MMT:  MMT Right eval Left eval  Shoulder flexion 2 5  Shoulder extension 3 5  Shoulder abduction 2 5  Shoulder adduction    Shoulder internal rotation 2 5  Shoulder external rotation 2 5  (Blank rows = not tested)    PALPATION:  TTP: Rt upper trap, Rt bicep tendons. Rt deltoid, Rt supraspinatus.                                                                                                                                                                                                   TODAY'S TREATMENT:  DATE: 11/02/23 TherEx Pulleys flexion and scaption x 3 min each Rows L2 band; 2x10 Seated Rt external rotation and flexion table slides 10 x 5 sec hold Supine AA shoulder flexion 1# bar x 10 reps Supine ER AA with 1# bar x 10 reps    10/31/23:  Therex:    HEP instruction/performance c cues for techniques, handout provided.  Trial set performed of each for comprehension and symptom assessment.  See below for exercise list   PATIENT EDUCATION: Education details: HEP, POC Person educated: Patient Education method: Explanation, Demonstration, Verbal cues, and Handouts Education comprehension: verbalized understanding, returned demonstration, and verbal cues required  HOME EXERCISE PROGRAM: Access Code: [email or web address] URL: [email or web address] Date:  10/31/2023 Prepared by: Narda Amber  Exercises - Standing Shoulder Row with Anchored Resistance  - 2-3 x daily - 7 x weekly - 10 reps - 3 seconds hold - Supine Shoulder Flexion Extension AAROM with Dowel  - 2-3 x daily - 7 x weekly - 2 sets - 10 reps - 2 seconds hold - Supine Shoulder External Rotation in 45 Degrees Abduction AAROM with Dowel  - 2-3 x daily - 7 x weekly - 2 sets - 10 reps - 2 seconds hold - Seated Bilateral Shoulder Flexion Towel Slide at Table Top  - 2-3 x daily - 7 x weekly - 10 reps - 2 seconds hold - Seated Shoulder External Rotation PROM on Table  - 2-3 x daily - 7 x weekly - 10 reps - 2 seconds hold  ASSESSMENT:  CLINICAL IMPRESSION: Pt continues to have elevated pain, but tolerated session with review of HEP today.  Will continue to benefit from PT to maximize function.  OBJECTIVE IMPAIRMENTS: decreased ROM, decreased strength, increased edema, impaired UE functional use, postural dysfunction, and pain.   ACTIVITY LIMITATIONS: carrying, sleeping, bathing, toileting, dressing, reach over head, and hygiene/grooming  PARTICIPATION LIMITATIONS: meal prep, cleaning, laundry, driving, shopping, community activity, and occupation  PERSONAL FACTORS: 3+ comorbidities: see pertinent history  are also affecting patient's functional outcome.   REHAB POTENTIAL: Good  CLINICAL DECISION MAKING: Stable/uncomplicated  EVALUATION COMPLEXITY: Low   GOALS: Goals reviewed with patient? Yes  SHORT TERM GOALS: (target date for Short term goals are 3 weeks 11/24/23)  1.Patient will demonstrate independent use of home exercise program to maintain progress from in clinic treatments. Goal status: New  LONG TERM GOALS: (target dates for all long term goals are 10 weeks  01/11/23 )   1. Patient will demonstrate/report pain at worst less than or equal to 2/10 to facilitate minimal limitation in daily activity secondary to pain symptoms. Goal status: New   2. Patient will  demonstrate independent use of home exercise program to facilitate ability to maintain/progress functional gains from skilled physical therapy services. Goal status: New   3. Patient will demonstrate FOTO outcome > or = 61 % to indicate reduced disability due to condition. Goal status: New   4.  Patient will demonstrate Rt UE MMT >/= 4/5 throughout to facilitate lifting, reaching, carrying at [facility] in daily activity.   Goal status: New   5.  Patient will demonstrate Rt GH joint AROM WFL s symptoms to facilitate usual overhead reaching, self care, dressing at PLOF.    Goal status: New   6.  Pt will improve her Rt shoulder flexion to >/= 140 degrees for improvements in ADL's.  Goal status: New  7. Pt will improve her Rt shoulder ER to >/= 60 degrees in order to improved functional actives.  Goal Status: New     PLAN:  PT FREQUENCY: 1-2x/week  PT DURATION: 10 weeks  PLANNED INTERVENTIONS: Can include 41660- PT Re-evaluation, 97110-Therapeutic exercises, 97530- Therapeutic activity, [id number]- Neuromuscular re-education, 97535- Self Care, 97140- Manual therapy, [phone]- Gait training, [phone]- Orthotic Fit/training, [phone]- Canalith repositioning, [id number]- Aquatic Therapy, 97014- Electrical stimulation (unattended), [id number]- Electrical stimulation (manual), [id number]- Vasopneumatic device, [id number]- Ultrasound, [id number]- Traction (mechanical), [id number]- Ionotophoresis 4mg /ml Dexamethasone, Patient/Family education, Balance training, Stair training, Taping, Dry Needling, Joint mobilization, Joint manipulation, Spinal manipulation, Spinal mobilization, Scar mobilization, Vestibular training, Visual/preceptual remediation/compensation, DME instructions, Cryotherapy, and Moist heat.  All performed as medically necessary.  All included unless contraindicated  PLAN FOR NEXT SESSION: shoulder ROM, pain management strategies PRN    Clarita Crane, PT, DPT 11/02/23 9:28 AM

## 2023-11-07 ENCOUNTER — Ambulatory Visit (INDEPENDENT_AMBULATORY_CARE_PROVIDER_SITE_OTHER): Payer: No Typology Code available for payment source | Admitting: Physical Therapy

## 2023-11-07 ENCOUNTER — Encounter: Payer: Self-pay | Admitting: Physical Therapy

## 2023-11-07 DIAGNOSIS — M25511 Pain in right shoulder: Secondary | ICD-10-CM

## 2023-11-07 DIAGNOSIS — M6281 Muscle weakness (generalized): Secondary | ICD-10-CM

## 2023-11-07 DIAGNOSIS — R600 Localized edema: Secondary | ICD-10-CM

## 2023-11-07 NOTE — Therapy (Addendum)
OUTPATIENT PHYSICAL THERAPY TREATMENT   Patient Name: Misty Bailey MRN: [id number] DOB:[birth year], 52 y.o., female Today's Date: 11/07/2023  END OF SESSION:  PT End of Session - 11/07/23 0857     Visit Number 3    Number of Visits 20    Date for PT Re-Evaluation 01/12/24    PT Start Time 0851    PT Stop Time 0929    PT Time Calculation (min) 38 min    Activity Tolerance Patient tolerated treatment well;Patient limited by pain    Behavior During Therapy [facility] for tasks assessed/performed               Past Medical History:  Diagnosis Date   Anemia    Anxiety    Arthritis 02/23/2014   Rib Cage   Asthma    Depression    Diverticulosis    Dysmenorrhea 05/08/2018   Essential hypertension 07/29/2019   HLD (hyperlipidemia)    Panic attacks    Pre-diabetes    Suppurative hidradenitis    Past Surgical History:  Procedure Laterality Date   CHOLECYSTECTOMY N/A 06/28/2023   Procedure: LAPAROSCOPIC CHOLECYSTECTOMY;  Surgeon: Abigail Miyamoto, MD;  Location: [facility] OR;  Service: General;  Laterality: N/A;  TAP BLOCK   INGUINAL HERNIA REPAIR Left 06/28/2023   Procedure: OPEN LEFT INGUINAL HERNIA REPAIR WITH MESH;  Surgeon: Abigail Miyamoto, MD;  Location: [facility] OR;  Service: General;  Laterality: Left;   oral surgery     PELVIC LAPAROSCOPY  1998/1999 `   pelvic adhesions and pain   TOE SURGERY Left    great toe   WISDOM TOOTH EXTRACTION     Patient Active Problem List   Diagnosis Date Noted   Neuropathy 06/22/2023   Influenza vaccine refused 01/04/2021   Inguinal hernia of left side without obstruction or gangrene 04/30/2020   Hyperlipidemia 07/30/2019   Syncope 07/29/2019   Essential hypertension 07/29/2019   Dysmenorrhea 05/08/2018   Herpes 03/21/2014   Suppurative hidradenitis    Depression     PCP: Marcine Matar, MD   REFERRING PROVIDER: Kathryne Hitch*   REFERRING DIAG:  Diagnosis  M24.611 (ICD-10-CM) - Arthrofibrosis of right shoulder   Z98.890 (ICD-10-CM) - Status post arthroscopy of right shoulder    THERAPY DIAG:  Acute pain of right shoulder  Muscle weakness (generalized)  Localized edema  Rationale for Evaluation and Treatment: Rehabilitation  ONSET DATE: February 2024  SUBJECTIVE:                                                                                                                                                                                      SUBJECTIVE STATEMENT:  I  had a rough night last night in terms of sleeping, shoulder was very painful. I think I slept on that shoulder last night. I've had shoulder pain since February of this year. Just had surgery in October, having a hard time reaching across and back but that was hard before surgery too. Pain has just become a part of me now.   PERTINENT HISTORY: At this point I recommended a manipulation under anesthesia followed by an arthroscopic intervention with lysis of adhesions and a subacromial decompression.  Pt s/p on 10/12/23 manipulation c debridement of anterior capsule,  inferior capsule and subacrominal space  PMH: anemia, anxiety, asthma, depression, diverticulosis, essential HTN, panic attacks, pre-diabetes, hernia repair, oral surgery, toe surgery, pelvic laparoscopy, cholecystectomy   PAIN:  NPRS scale: 7/10 and worse with movement Pain location: Rt shoulder- anterior and lateral, some posterior shoulder  Pain description: achy, sore, sharp at times Aggravating factors: lifting my arm, ADL's Relieving factors: resting, pain meds as needed  PRECAUTIONS: None  WEIGHT BEARING RESTRICTIONS: No  FALLS:  Has patient fallen in last 6 months? Yes. Number of falls pt stating no injuries, pt stating she was dehydrated.   LIVING ENVIRONMENT: Lives with: lives with their family and lives alone Lives in: House/apartment Stairs: No Has following equipment at home: None  OCCUPATION: Out of work since May  PLOF:  Independent  PATIENT GOALS: be able reach overhead  Next MD visit:   OBJECTIVE:   DIAGNOSTIC FINDINGS:  IMPRESSION: 1. Mild tendinosis of the supraspinatus tendon anteriorly. 2. Mild subacromial/subdeltoid bursitis. 3. Mild relative thickening of the inferior joint capsule as can be seen with adhesive capsulitis.  PATIENT SURVEYS:  10/31/23: FOTO intake:  41%     COGNITION: Overall cognitive status: WFL     SENSATION: Tingling in Rt fingers, pt feels that may be due to B-12 deficiency  POSTURE: Forward head and rounded shoulders  UPPER EXTREMITY ROM:   ROM Right 10/31/23 Left eval Right  PROM 11/07/23  Shoulder flexion 60 155 90*  Shoulder extension 15 40   Shoulder abduction 65 162 80*  Shoulder adduction     Shoulder internal rotation 50 75 60*  Shoulder external rotation 25 75 15-20* very guarded and pain limited   (Blank rows = not tested)  UPPER EXTREMITY MMT:  MMT Right eval Left eval  Shoulder flexion 2 5  Shoulder extension 3 5  Shoulder abduction 2 5  Shoulder adduction    Shoulder internal rotation 2 5  Shoulder external rotation 2 5  (Blank rows = not tested)    PALPATION:  TTP: Rt upper trap, Rt bicep tendons. Rt deltoid, Rt supraspinatus.  TODAY'S TREATMENT:                                                                                                       DATE:  11/07/23  TherEx  AAROM flexion with cane supine x15 Isometric shoulder flexion into wall x15 10-25% effort  Isometric shoulder ABD into wall x15 10-25% effort  Isometric shoulder ER into wall, towel tucked under elbow 10% effort  Encouragement regarding recovery from shoulder surgery, course of progression post-op with PT after surgery     Manual  PROM to tolerance all  directions, gentle grade I to II GH mobs as tolerated       11/02/23 TherEx Pulleys flexion and scaption x 3 min each Rows L2 band; 2x10 Seated Rt external rotation and flexion table slides 10 x 5 sec hold Supine AA shoulder flexion 1# bar x 10 reps Supine ER AA with 1# bar x 10 reps    10/31/23:  Therex:    HEP instruction/performance c cues for techniques, handout provided.  Trial set performed of each for comprehension and symptom assessment.  See below for exercise list   PATIENT EDUCATION: Education details: HEP, POC Person educated: Patient Education method: Explanation, Demonstration, Verbal cues, and Handouts Education comprehension: verbalized understanding, returned demonstration, and verbal cues required  HOME EXERCISE PROGRAM: Access Code: [email or web address] URL: [email or web address] Date: 10/31/2023 Prepared by: Narda Amber  Exercises - Standing Shoulder Row with Anchored Resistance  - 2-3 x daily - 7 x weekly - 10 reps - 3 seconds hold - Supine Shoulder Flexion Extension AAROM with Dowel  - 2-3 x daily - 7 x weekly - 2 sets - 10 reps - 2 seconds hold - Supine Shoulder External Rotation in 45 Degrees Abduction AAROM with Dowel  - 2-3 x daily - 7 x weekly - 2 sets - 10 reps - 2 seconds hold - Seated Bilateral Shoulder Flexion Towel Slide at Table Top  - 2-3 x daily - 7 x weekly - 10 reps - 2 seconds hold - Seated Shoulder External Rotation PROM on Table  - 2-3 x daily - 7 x weekly - 10 reps - 2 seconds hold  ASSESSMENT:  CLINICAL IMPRESSION:  Pt arrives today doing OK, still fairly pain limited. Focused session on ROM/manual techniques with progression of very gentle strengthening as tolerated. Tolerated gentle GH mobs well. Will continue to progress interventions as appropriate/pain allows.    OBJECTIVE IMPAIRMENTS: decreased ROM, decreased strength, increased edema, impaired UE functional use, postural dysfunction, and pain.   ACTIVITY  LIMITATIONS: carrying, sleeping, bathing, toileting, dressing, reach over head, and hygiene/grooming  PARTICIPATION LIMITATIONS: meal prep, cleaning, laundry, driving, shopping, community activity, and occupation  PERSONAL FACTORS: 3+ comorbidities: see pertinent history  are also affecting patient's functional outcome.   REHAB POTENTIAL: Good  CLINICAL DECISION MAKING: Stable/uncomplicated  EVALUATION COMPLEXITY: Low   GOALS: Goals reviewed with patient? Yes  SHORT TERM GOALS: (target date for Short term goals are 3 weeks 11/24/23)  1.Patient will demonstrate independent use of home exercise program to maintain progress from in clinic treatments. Goal  status: New  LONG TERM GOALS: (target dates for all long term goals are 10 weeks  01/11/23 )   1. Patient will demonstrate/report pain at worst less than or equal to 2/10 to facilitate minimal limitation in daily activity secondary to pain symptoms. Goal status: New   2. Patient will demonstrate independent use of home exercise program to facilitate ability to maintain/progress functional gains from skilled physical therapy services. Goal status: New   3. Patient will demonstrate FOTO outcome > or = 61 % to indicate reduced disability due to condition. Goal status: New   4.  Patient will demonstrate Rt UE MMT >/= 4/5 throughout to facilitate lifting, reaching, carrying at [facility] in daily activity.   Goal status: New   5.  Patient will demonstrate Rt GH joint AROM WFL s symptoms to facilitate usual overhead reaching, self care, dressing at PLOF.    Goal status: New   6.  Pt will improve her Rt shoulder flexion to >/= 140 degrees for improvements in ADL's.  Goal status: New  7. Pt will improve her Rt shoulder ER to >/= 60 degrees in order to improved functional actives.   Goal Status: New     PLAN:  PT FREQUENCY: 1-2x/week  PT DURATION: 10 weeks  PLANNED INTERVENTIONS: Can include 40981- PT Re-evaluation,  97110-Therapeutic exercises, 97530- Therapeutic activity, [id number]- Neuromuscular re-education, 97535- Self Care, 97140- Manual therapy, [phone]- Gait training, [phone]- Orthotic Fit/training, [phone]- Canalith repositioning, [id number]- Aquatic Therapy, 97014- Electrical stimulation (unattended), [id number]- Electrical stimulation (manual), [id number]- Vasopneumatic device, [id number]- Ultrasound, [id number]- Traction (mechanical), [id number]- Ionotophoresis 4mg /ml Dexamethasone, Patient/Family education, Balance training, Stair training, Taping, Dry Needling, Joint mobilization, Joint manipulation, Spinal manipulation, Spinal mobilization, Scar mobilization, Vestibular training, Visual/preceptual remediation/compensation, DME instructions, Cryotherapy, and Moist heat.  All performed as medically necessary.  All included unless contraindicated  PLAN FOR NEXT SESSION: shoulder ROM, pain management strategies PRN, consider adding isometrics to HEP?    Nedra Hai, PT, DPT 11/07/23 9:29 AM

## 2023-11-09 ENCOUNTER — Encounter: Payer: No Typology Code available for payment source | Admitting: Physical Therapy

## 2023-11-10 ENCOUNTER — Other Ambulatory Visit (HOSPITAL_COMMUNITY)
Admission: RE | Admit: 2023-11-10 | Discharge: 2023-11-10 | Disposition: A | Discharge status: Home / Self Care | Payer: No Typology Code available for payment source | Source: Ambulatory Visit | Attending: Internal Medicine | Admitting: Internal Medicine

## 2023-11-10 ENCOUNTER — Encounter: Payer: Self-pay | Admitting: Internal Medicine

## 2023-11-10 ENCOUNTER — Ambulatory Visit: Payer: No Typology Code available for payment source | Attending: Internal Medicine | Admitting: Internal Medicine

## 2023-11-10 VITALS — BP 114/73 | HR 80 | Temp 98.4°F | Ht 61.0 in | Wt 147.0 lb

## 2023-11-10 DIAGNOSIS — F320 Major depressive disorder, single episode, mild: Secondary | ICD-10-CM

## 2023-11-10 DIAGNOSIS — E538 Deficiency of other specified B group vitamins: Secondary | ICD-10-CM | POA: Diagnosis not present

## 2023-11-10 DIAGNOSIS — I10 Essential (primary) hypertension: Secondary | ICD-10-CM | POA: Diagnosis not present

## 2023-11-10 DIAGNOSIS — N898 Other specified noninflammatory disorders of vagina: Secondary | ICD-10-CM | POA: Diagnosis not present

## 2023-11-10 MED ORDER — FLUCONAZOLE 150 MG PO TABS: 150.0000 mg | ORAL_TABLET | Freq: Every day | ORAL | 0 refills | Status: DC

## 2023-11-10 MED ORDER — CYANOCOBALAMIN 1000 MCG/ML IJ SOLN
1000.0000 ug | Freq: Once | INTRAMUSCULAR | Status: AC
Start: 2023-11-10 — End: ?

## 2023-11-10 MED ORDER — AMLODIPINE BESYLATE 10 MG PO TABS
10.0000 mg | ORAL_TABLET | Freq: Every day | ORAL | 1 refills | Status: DC
Start: 2023-11-10 — End: 2024-09-19

## 2023-11-10 NOTE — Progress Notes (Signed)
Patient ID: Nakeyah Addicott Wiedel, female    DOB: 06-Aug-1971  MRN: [id number]  CC: Hypertension (HTN f/u. Med refill. Charlton Haws itchiness, vaginal d/c/Can pt receive B12 injectin today? Last one done on 10/30/23/Yes to pap.)   Subjective: Latreece Gwynn is a 52 y.o. female who presents for chronic ds management. Her concerns today include:  Dysmenorrhea, uterine fibroid, diverticulosis, HL, HTN, hidradenitis, bronchospasm.   B12 def: Plan was to give  B12 shots 2 weeks apart then patient was to start taking vitamin B12 supplement at 1000 mcg daily from OTC.  had 1 shot 11/4.  Wants to know if she can be given her second 1 while she is here today. Has 1000 mcg at home, did not start taking as yet  C/o Vaginal itching, white dischg x on and off x 1 mth.  Was using Vagasil cream OTC  HTN: compliant with taking amlodipine 10 mg daily and Cozaar 25 mg daily.  She limits salt in the foods.  Pos dep screen.  PHQ9 still positive but level decreased She describes herself as a functional depressed person; has had a lot trauma, a lot of negative thoughts about her self. Feels she would benefit from counseling.  Not interested in medication at this time. Patient Active Problem List   Diagnosis Date Noted   Neuropathy 06/22/2023   Influenza vaccine refused 01/04/2021   Inguinal hernia of left side without obstruction or gangrene 04/30/2020   Hyperlipidemia 07/30/2019   Syncope 07/29/2019   Essential hypertension 07/29/2019   Dysmenorrhea 05/08/2018   Herpes 03/21/2014   Suppurative hidradenitis    Depression      Current Outpatient Medications on File Prior to Visit  Medication Sig Dispense Refill   albuterol (VENTOLIN HFA) 108 (90 Base) MCG/ACT inhaler Inhale 2 puffs into the lungs every 6 (six) hours as needed for wheezing or shortness of breath. 8 g 4   atorvastatin (LIPITOR) 10 MG tablet Take 1 tablet (10 mg total) by mouth daily. 90 tablet 1   desonide (DESOWEN) 0.05 % ointment Apply 1  Application topically daily as needed (rash).     fluticasone (FLONASE) 50 MCG/ACT nasal spray Place 1 spray into both nostrils daily. (Patient taking differently: Place 1 spray into both nostrils daily as needed for allergies.) 16 g 1   losartan (COZAAR) 25 MG tablet Take 1 tablet (25 mg total) by mouth daily. 90 tablet 1   oxyCODONE-acetaminophen (PERCOCET/ROXICET) 5-325 MG tablet Take 1-2 tablets by mouth every 6 (six) hours as needed for severe pain (pain score 7-10). 30 tablet 0   tiZANidine (ZANAFLEX) 4 MG tablet Take 1 tablet (4 mg total) by mouth every 8 (eight) hours as needed for muscle spasms. 30 tablet 1   gabapentin (NEURONTIN) 100 MG capsule Take 3 capsules (300 mg total) by mouth at bedtime as needed. (Patient not taking: Reported on 11/10/2023) 180 capsule 3   No current facility-administered medications on file prior to visit.    Allergies  Allergen Reactions   Codeine Shortness Of Breath    Social History   Socioeconomic History   Marital status: Single    Spouse name: Not on file   Number of children: 0   Years of education: Associates   Highest education level: Associate degree: occupational, Scientist, product/process development, or vocational program  Occupational History   Not on file  Tobacco Use   Smoking status: Former    Current packs/day: 0.00    Types: Cigarettes    Quit date: 1996  Years since quitting: 28.8   Smokeless tobacco: Never   Tobacco comments:    Weed  Vaping Use   Vaping status: Never Used  Substance and Sexual Activity   Alcohol use: Yes    Comment: social   Drug use: Yes    Frequency: 3.0 times per week    Types: Marijuana   Sexual activity: Not Currently    Partners: Male    Birth control/protection: None  Other Topics Concern   Not on file  Social History Narrative   Boyfriend incarcerated 2014-2018   Social Determinants of Health   Financial Resource Strain: Medium Risk (11/10/2023)   Overall Financial Resource Strain (CARDIA)    Difficulty  of Paying Living Expenses: Somewhat hard  Food Insecurity: Food Insecurity Present (11/10/2023)   Hunger Vital Sign    Worried About Running Out of Food in the Last Year: Sometimes true    Ran Out of Food in the Last Year: Sometimes true  Transportation Needs: No Transportation Needs (10/26/2023)   PRAPARE - Administrator, Civil Service (Medical): No    Lack of Transportation (Non-Medical): No  Physical Activity: Insufficiently Active (11/10/2023)   Exercise Vital Sign    Days of Exercise per Week: 2 days    Minutes of Exercise per Session: 30 min  Stress: Stress Concern Present (11/10/2023)   [employer] of Occupational Health - Occupational Stress Questionnaire    Feeling of Stress : Rather much  Social Connections: Unknown (11/10/2023)   Social Connection and Isolation Panel [NHANES]    Frequency of Communication with Friends and Family: More than three times a week    Frequency of Social Gatherings with Friends and Family: More than three times a week    Attends Religious Services: 1 to 4 times per year    Active Member of [employer] or Organizations: Yes    Attends Engineer, structural: More than 4 times per year    Marital Status: Patient declined  Recent Concern: Social Connections - Moderately Isolated (10/26/2023)   Social Connection and Isolation Panel [NHANES]    Frequency of Communication with Friends and Family: More than three times a week    Frequency of Social Gatherings with Friends and Family: More than three times a week    Attends Religious Services: More than 4 times per year    Active Member of [employer] or Organizations: No    Attends Engineer, structural: Not on file    Marital Status: Never married  Intimate Partner Violence: Not At Risk (11/10/2023)   Humiliation, Afraid, Rape, and Kick questionnaire    Fear of Current or Ex-Partner: No    Emotionally Abused: No    Physically Abused: No    Sexually Abused: No    Family  History  Problem Relation Age of Onset   Diabetes Mother    Hypertension Mother    Hyperlipidemia Mother    Heart murmur Mother    Crohn's disease Mother    Hypertension Father    Stroke Father    Hyperlipidemia Father    Diabetes Sister    Hypertension Sister    Hyperlipidemia Sister    Hypertension Maternal Grandmother    Diabetes Maternal Grandmother    Osteoporosis Maternal Grandmother    Cirrhosis Maternal Grandfather    Thyroid disease Paternal Aunt    Breast cancer Neg Hx     Past Surgical History:  Procedure Laterality Date   CHOLECYSTECTOMY N/A 06/28/2023   Procedure: LAPAROSCOPIC CHOLECYSTECTOMY;  Surgeon: Abigail Miyamoto, MD;  Location: [facility] OR;  Service: General;  Laterality: N/A;  TAP BLOCK   INGUINAL HERNIA REPAIR Left 06/28/2023   Procedure: OPEN LEFT INGUINAL HERNIA REPAIR WITH MESH;  Surgeon: Abigail Miyamoto, MD;  Location: [facility] OR;  Service: General;  Laterality: Left;   oral surgery     PELVIC LAPAROSCOPY  1998/1999 `   pelvic adhesions and pain   TOE SURGERY Left    great toe   WISDOM TOOTH EXTRACTION      ROS: Review of Systems Negative except as stated above  PHYSICAL EXAM: BP 114/73 (BP Location: Left Arm, Patient Position: Sitting, Cuff Size: Normal)   Pulse 80   Temp 98.4 F (36.9 C) (Oral)   Ht 5\' 1"  (1.549 m)   Wt 147 lb (66.7 kg)   LMP 09/20/2023 Comment: Pt has irregular periods  SpO2 98%   BMI 27.78 kg/m   Physical Exam  General appearance - alert, well appearing, and in no distress Mental status - normal mood, behavior, speech, dress, motor activity, and thought processes Chest - clear to auscultation, no wheezes, rales or rhonchi, symmetric air entry Heart - normal rate, regular rhythm, normal S1, S2, no murmurs, rubs, clicks or gallops     11/10/2023    3:58 PM 10/26/2023    2:34 PM 06/21/2023    9:19 AM  Depression screen PHQ 2/9  Decreased Interest 1 2 1   Down, Depressed, Hopeless 2 3 3   PHQ - 2 Score 3 5 4   Altered  sleeping 3 3 2   Tired, decreased energy 3 3 2   Change in appetite 1 3 2   Feeling bad or failure about yourself  2 3 3   Trouble concentrating 0 0 0  Moving slowly or fidgety/restless 0 0 0  Suicidal thoughts 0 0 0  PHQ-9 Score 12 17 13   Difficult doing work/chores  Not difficult at all        Latest Ref Rng & Units 10/23/2023   11:46 AM 06/21/2023    2:00 PM 05/11/2023   12:09 PM  CMP  Glucose 70 - 99 mg/dL 130  865  784   BUN 6 - 20 mg/dL 15  15  17    Creatinine 0.44 - 1.00 mg/dL 6.96  2.95  2.84   Sodium 135 - 145 mmol/L 140  140  143   Potassium 3.5 - 5.1 mmol/L 3.8  3.6  4.2   Chloride 98 - 111 mmol/L 106  103  108   CO2 22 - 32 mmol/L 27  24  21    Calcium 8.9 - 10.3 mg/dL 9.6  9.7  9.4    Lipid Panel     Component Value Date/Time   CHOL 213 (H) 02/03/2021 1005   TRIG 78 02/03/2021 1005   HDL 56 02/03/2021 1005   CHOLHDL 3.8 02/03/2021 1005   LDLCALC 143 (H) 02/03/2021 1005    CBC    Component Value Date/Time   WBC 6.1 10/23/2023 1146   RBC 4.72 10/23/2023 1146   HGB 13.5 10/23/2023 1146   HGB 13.6 08/17/2021 1037   HCT 42.2 10/23/2023 1146   HCT 42.3 08/17/2021 1037   PLT 277 10/23/2023 1146   PLT 312 08/17/2021 1037   MCV 89.4 10/23/2023 1146   MCV 89 08/17/2021 1037   MCH 28.6 10/23/2023 1146   MCHC 32.0 10/23/2023 1146   RDW 14.0 10/23/2023 1146   RDW 12.6 08/17/2021 1037   LYMPHSABS 2.7 01/25/2018 1106   MONOABS 0.5 08/09/2013  1045   EOSABS 0.2 01/25/2018 1106   BASOSABS 0.0 01/25/2018 1106    ASSESSMENT AND PLAN: 1. Essential hypertension At goal.  Continue Norvasc 10 mg daily. - amLODipine (NORVASC) 10 MG tablet; Take 1 tablet (10 mg total) by mouth daily.  Dispense: 90 tablet; Refill: 1  2. Itching of vagina Stop over-the-counter Vagasil cream. - fluconazole (DIFLUCAN) 150 MG tablet; Take 1 tablet (150 mg total) by mouth daily.  Dispense: 1 tablet; Refill: 0 - Cervicovaginal ancillary only  3. Vitamin B 12 deficiency Given second B12 shot  today.  Advised patient to wait 2 weeks and then start taking the oral over-the-counter vitamin B12 1000 mcg daily.  Return to the lab in about 1 month for repeat B12 level check - Vitamin B12; Future - cyanocobalamin (VITAMIN B12) injection 1,000 mcg  4. Mild major depression (HCC) Plan was to give her a printed handout of behavioral health providers in the [place] area but I forgot to do so so I will go ahead and submit the referral for her.   Patient was given the opportunity to ask questions.  Patient verbalized understanding of the plan and was able to repeat key elements of the plan.   This documentation was completed using Paediatric nurse.  Any transcriptional errors are unintentional.  Orders Placed This Encounter  Procedures   Vitamin B12   Ambulatory referral to Psychiatry     Requested Prescriptions   Signed Prescriptions Disp Refills   amLODipine (NORVASC) 10 MG tablet 90 tablet 1    Sig: Take 1 tablet (10 mg total) by mouth daily.   fluconazole (DIFLUCAN) 150 MG tablet 1 tablet 0    Sig: Take 1 tablet (150 mg total) by mouth daily.    Return in about 7 weeks (around 12/29/2023) for PAP.  Jonah Blue, MD, FACP

## 2023-11-13 ENCOUNTER — Ambulatory Visit: Payer: No Typology Code available for payment source

## 2023-11-14 ENCOUNTER — Ambulatory Visit (INDEPENDENT_AMBULATORY_CARE_PROVIDER_SITE_OTHER): Payer: No Typology Code available for payment source | Admitting: Physical Therapy

## 2023-11-14 ENCOUNTER — Encounter: Payer: Self-pay | Admitting: Physical Therapy

## 2023-11-14 DIAGNOSIS — R600 Localized edema: Secondary | ICD-10-CM

## 2023-11-14 DIAGNOSIS — M25511 Pain in right shoulder: Secondary | ICD-10-CM | POA: Diagnosis not present

## 2023-11-14 DIAGNOSIS — M6281 Muscle weakness (generalized): Secondary | ICD-10-CM | POA: Diagnosis not present

## 2023-11-14 LAB — CERVICOVAGINAL ANCILLARY ONLY
Bacterial Vaginitis (gardnerella): POSITIVE — AB
Candida Glabrata: NEGATIVE
Candida Vaginitis: POSITIVE — AB
Chlamydia: NEGATIVE
Comment: NEGATIVE
Comment: NEGATIVE
Comment: NEGATIVE
Comment: NEGATIVE
Comment: NEGATIVE
Comment: NORMAL
Neisseria Gonorrhea: NEGATIVE
Trichomonas: NEGATIVE

## 2023-11-14 NOTE — Therapy (Addendum)
OUTPATIENT PHYSICAL THERAPY TREATMENT   Patient Name: Misty Bailey MRN: [id number] DOB:Dec 27, 1970, 52 y.o., female Today's Date: 11/14/2023  END OF SESSION:  PT End of Session - 11/14/23 0955     Visit Number 4    Number of Visits 20    Date for PT Re-Evaluation 01/12/24    PT Start Time 0930    PT Stop Time 1010    PT Time Calculation (min) 40 min    Activity Tolerance Patient tolerated treatment well;Patient limited by pain    Behavior During Therapy [facility] for tasks assessed/performed                Past Medical History:  Diagnosis Date   Anemia    Anxiety    Arthritis 02/23/2014   Rib Cage   Asthma    Depression    Diverticulosis    Dysmenorrhea 05/08/2018   Essential hypertension 07/29/2019   HLD (hyperlipidemia)    Panic attacks    Pre-diabetes    Suppurative hidradenitis    Past Surgical History:  Procedure Laterality Date   CHOLECYSTECTOMY N/A 06/28/2023   Procedure: LAPAROSCOPIC CHOLECYSTECTOMY;  Surgeon: Abigail Miyamoto, MD;  Location: [facility] OR;  Service: General;  Laterality: N/A;  TAP BLOCK   INGUINAL HERNIA REPAIR Left 06/28/2023   Procedure: OPEN LEFT INGUINAL HERNIA REPAIR WITH MESH;  Surgeon: Abigail Miyamoto, MD;  Location: [facility] OR;  Service: General;  Laterality: Left;   oral surgery     PELVIC LAPAROSCOPY  1998/1999 `   pelvic adhesions and pain   TOE SURGERY Left    great toe   WISDOM TOOTH EXTRACTION     Patient Active Problem List   Diagnosis Date Noted   Neuropathy 06/22/2023   Influenza vaccine refused 01/04/2021   Inguinal hernia of left side without obstruction or gangrene 04/30/2020   Hyperlipidemia 07/30/2019   Syncope 07/29/2019   Essential hypertension 07/29/2019   Dysmenorrhea 05/08/2018   Herpes 03/21/2014   Suppurative hidradenitis    Depression     PCP: Marcine Matar, MD   REFERRING PROVIDER: Kathryne Hitch*   REFERRING DIAG:  Diagnosis  M24.611 (ICD-10-CM) - Arthrofibrosis of right shoulder   Z98.890 (ICD-10-CM) - Status post arthroscopy of right shoulder    THERAPY DIAG:  Acute pain of right shoulder  Muscle weakness (generalized)  Localized edema  Rationale for Evaluation and Treatment: Rehabilitation  ONSET DATE: February 2024  SUBJECTIVE:                                                                                                                                                                                      SUBJECTIVE STATEMENT:  Pt stating this past week her pain was stabbing and reporting 7/10. Pt stating she was really active on Saturday and she may have over did it. Pt reporting pain Saturday night was 10/10.   PERTINENT HISTORY: At this point I recommended a manipulation under anesthesia followed by an arthroscopic intervention with lysis of adhesions and a subacromial decompression.  Pt s/p on 10/12/23 manipulation c debridement of anterior capsule,  inferior capsule and subacrominal space  PMH: anemia, anxiety, asthma, depression, diverticulosis, essential HTN, panic attacks, pre-diabetes, hernia repair, oral surgery, toe surgery, pelvic laparoscopy, cholecystectomy   PAIN:  NPRS scale: 7/10, worse 10/10 over the weekend Pain location: Rt shoulder- anterior and lateral, some posterior shoulder  Pain description: achy, sore, sharp at times Aggravating factors: lifting my arm, ADL's Relieving factors: resting, pain meds as needed  PRECAUTIONS: None  WEIGHT BEARING RESTRICTIONS: No  FALLS:  Has patient fallen in last 6 months? Yes. Number of falls pt stating no injuries, pt stating she was dehydrated.   LIVING ENVIRONMENT: Lives with: lives with their family and lives alone Lives in: House/apartment Stairs: No Has following equipment at home: None  OCCUPATION: Out of work since May  PLOF: Independent  PATIENT GOALS: be able reach overhead  Next MD visit:   OBJECTIVE:   DIAGNOSTIC FINDINGS:  IMPRESSION: 1. Mild tendinosis of the  supraspinatus tendon anteriorly. 2. Mild subacromial/subdeltoid bursitis. 3. Mild relative thickening of the inferior joint capsule as can be seen with adhesive capsulitis.  PATIENT SURVEYS:  10/31/23: FOTO intake:  41%     COGNITION: Overall cognitive status: WFL     SENSATION: Tingling in Rt fingers, pt feels that may be due to B-12 deficiency  POSTURE: Forward head and rounded shoulders  UPPER EXTREMITY ROM:   ROM (* indicated pain) Right 10/31/23 Left eval Right  PROM 11/07/23 Rt 11/14/23 PROM supine  Shoulder flexion 60 155 90* 110*  Shoulder extension 15 40    Shoulder abduction 65 162 80* 100*  Shoulder adduction      Shoulder internal rotation 50 75 60* 60 *  Shoulder external rotation 25 75 15-20* very guarded and pain limited  30 *  (Blank rows = not tested)  UPPER EXTREMITY MMT:  MMT Right eval Left eval  Shoulder flexion 2 5  Shoulder extension 3 5  Shoulder abduction 2 5  Shoulder adduction    Shoulder internal rotation 2 5  Shoulder external rotation 2 5  (Blank rows = not tested)    PALPATION:  TTP: Rt upper trap, Rt bicep tendons. Rt deltoid, Rt supraspinatus.  TODAY'S TREATMENT:                                                                                                       DATE: 11/14/23:  TherEx:  Shoulder isometrics: flexion, extension, IR and ER all x 15 holding 5 seconds Wall slides using towel x 10  UE ranger Rt UE seated: small circles both directions 2 x 10 UE ranger Rt UE seated: flexion 2 x 10  Supine flexion AAROMc 1# bar  2  x 10  Supine Rt shoulder ER: using 1 # bar AAROM: 2 x 10  Manual:  PROM as tolerated, gentle grade 1-2 GH mobs   11/07/23 TherEx AAROM flexion with cane supine x15 Isometric shoulder flexion into wall x15  10-25% effort  Isometric shoulder ABD into wall x15 10-25% effort  Isometric shoulder ER into wall, towel tucked under elbow 10% effort  Encouragement regarding recovery from shoulder surgery, course of progression post-op with PT after surgery    Manual PROM to tolerance all directions, gentle grade I to II GH mobs as tolerated     11/02/23 TherEx Pulleys flexion and scaption x 3 min each Rows L2 band; 2x10 Seated Rt external rotation and flexion table slides 10 x 5 sec hold Supine AA shoulder flexion 1# bar x 10 reps Supine ER AA with 1# bar x 10 reps    PATIENT EDUCATION: Education details: HEP, POC Person educated: Patient Education method: Programmer, multimedia, Facilities manager, Verbal cues, and Handouts Education comprehension: verbalized understanding, returned demonstration, and verbal cues required  HOME EXERCISE PROGRAM: Access Code: [email or web address] URL: [email or web address] Date: 11/14/2023 Prepared by: Narda Amber  Exercises - Standing Shoulder Row with Anchored Resistance  - 2-3 x daily - 7 x weekly - 10 reps - 3 seconds hold - Supine Shoulder Flexion Extension AAROM with Dowel  - 2-3 x daily - 7 x weekly - 2 sets - 10 reps - 2 seconds hold - Supine Shoulder External Rotation in 45 Degrees Abduction AAROM with Dowel  - 2-3 x daily - 7 x weekly - 2 sets - 10 reps - 2 seconds hold - Seated Bilateral Shoulder Flexion Towel Slide at Table Top  - 2-3 x daily - 7 x weekly - 10 reps - 2 seconds hold - Seated Shoulder External Rotation PROM on Table  - 2-3 x daily - 7 x weekly - 10 reps - 2 seconds hold - Isometric Shoulder Flexion at Wall  - 1-2 x daily - 7 x weekly - 2 sets - 10 reps - 5 secibds hold - Isometric Shoulder Extension at Wall  - 1-2 x daily - 7 x weekly - 2 sets - 10 reps - 5 seconds hold - Standing Isometric Shoulder Internal Rotation at Doorway  - 1-2 x daily - 7 x weekly - 2 sets - 10 reps - 5 seconds hold - Standing Isometric Shoulder External Rotation  with Doorway  - 1-2 x daily - 7 x weekly - 2 sets - 10 reps - 5 seconds hold  ASSESSMENT:  CLINICAL IMPRESSION: Continuing with ROM, manual and  gentle strengthening as tolerated. Pt's HEP was updated with 4 way shoulder isometrics this visit. It was recommended that pt purchase a set of pulleys at home to add to her HEP. Continue skilled PT to maximize pt's function and progress toward goals set.    OBJECTIVE IMPAIRMENTS: decreased ROM, decreased strength, increased edema, impaired UE functional use, postural dysfunction, and pain.   ACTIVITY LIMITATIONS: carrying, sleeping, bathing, toileting, dressing, reach over head, and hygiene/grooming  PARTICIPATION LIMITATIONS: meal prep, cleaning, laundry, driving, shopping, community activity, and occupation  PERSONAL FACTORS: 3+ comorbidities: see pertinent history  are also affecting patient's functional outcome.   REHAB POTENTIAL: Good  CLINICAL DECISION MAKING: Stable/uncomplicated  EVALUATION COMPLEXITY: Low   GOALS: Goals reviewed with patient? Yes  SHORT TERM GOALS: (target date for Short term goals are 3 weeks 11/24/23)  1.Patient will demonstrate independent use of home exercise program to maintain progress from in clinic treatments. Goal status: MET 11/14/23  LONG TERM GOALS: (target dates for all long term goals are 10 weeks  01/11/23 )   1. Patient will demonstrate/report pain at worst less than or equal to 2/10 to facilitate minimal limitation in daily activity secondary to pain symptoms. Goal status: New   2. Patient will demonstrate independent use of home exercise program to facilitate ability to maintain/progress functional gains from skilled physical therapy services. Goal status: New   3. Patient will demonstrate FOTO outcome > or = 61 % to indicate reduced disability due to condition. Goal status: New   4.  Patient will demonstrate Rt UE MMT >/= 4/5 throughout to facilitate lifting, reaching, carrying at [facility] in  daily activity.   Goal status: New   5.  Patient will demonstrate Rt GH joint AROM WFL s symptoms to facilitate usual overhead reaching, self care, dressing at PLOF.    Goal status: New   6.  Pt will improve her Rt shoulder flexion to >/= 140 degrees for improvements in ADL's.  Goal status: New  7. Pt will improve her Rt shoulder ER to >/= 60 degrees in order to improved functional actives.   Goal Status: New     PLAN:  PT FREQUENCY: 1-2x/week  PT DURATION: 10 weeks  PLANNED INTERVENTIONS: Can include 16109- PT Re-evaluation, 97110-Therapeutic exercises, 97530- Therapeutic activity, [id number]- Neuromuscular re-education, 97535- Self Care, 97140- Manual therapy, [phone]- Gait training, [phone]- Orthotic Fit/training, [phone]- Canalith repositioning, [id number]- Aquatic Therapy, 97014- Electrical stimulation (unattended), [id number]- Electrical stimulation (manual), [id number]- Vasopneumatic device, [id number]- Ultrasound, [id number]- Traction (mechanical), [id number]- Ionotophoresis 4mg /ml Dexamethasone, Patient/Family education, Balance training, Stair training, Taping, Dry Needling, Joint mobilization, Joint manipulation, Spinal manipulation, Spinal mobilization, Scar mobilization, Vestibular training, Visual/preceptual remediation/compensation, DME instructions, Cryotherapy, and Moist heat.  All performed as medically necessary.  All included unless contraindicated  PLAN FOR NEXT SESSION: shoulder ROM, pain management strategies PRN   Narda Amber, PT, MPT 11/14/23 9:58 AM   11/14/23 9:58 AM

## 2023-11-15 ENCOUNTER — Other Ambulatory Visit: Payer: Self-pay | Admitting: Internal Medicine

## 2023-11-15 ENCOUNTER — Encounter: Payer: Self-pay | Admitting: Internal Medicine

## 2023-11-15 MED ORDER — METRONIDAZOLE 500 MG PO TABS: 500.0000 mg | ORAL_TABLET | Freq: Two times a day (BID) | ORAL | 0 refills | Status: DC

## 2023-11-16 ENCOUNTER — Other Ambulatory Visit: Payer: Self-pay | Admitting: Internal Medicine

## 2023-11-16 ENCOUNTER — Encounter: Payer: Self-pay | Admitting: Rehabilitative and Restorative Service Providers"

## 2023-11-16 ENCOUNTER — Ambulatory Visit: Payer: No Typology Code available for payment source | Admitting: Rehabilitative and Restorative Service Providers"

## 2023-11-16 DIAGNOSIS — M25511 Pain in right shoulder: Secondary | ICD-10-CM | POA: Diagnosis not present

## 2023-11-16 DIAGNOSIS — M6281 Muscle weakness (generalized): Secondary | ICD-10-CM | POA: Diagnosis not present

## 2023-11-16 DIAGNOSIS — R600 Localized edema: Secondary | ICD-10-CM | POA: Diagnosis not present

## 2023-11-16 MED ORDER — METRONIDAZOLE 0.75 % VA GEL: 1.0000 | Freq: Every day | VAGINAL | 0 refills | Status: DC

## 2023-11-16 NOTE — Therapy (Addendum)
OUTPATIENT PHYSICAL THERAPY TREATMENT   Patient Name: Misty Bailey MRN: [id number] DOB:1971/09/21, 52 y.o., female Today's Date: 11/16/2023  END OF SESSION:  PT End of Session - 11/16/23 0819     Visit Number 5    Number of Visits 20    Date for PT Re-Evaluation 01/12/24    Progress Note Due on Visit 10    PT Start Time 0820    PT Stop Time 0900    PT Time Calculation (min) 40 min    Activity Tolerance Patient limited by pain    Behavior During Therapy [facility] for tasks assessed/performed                 Past Medical History:  Diagnosis Date   Anemia    Anxiety    Arthritis 02/23/2014   Rib Cage   Asthma    Depression    Diverticulosis    Dysmenorrhea 05/08/2018   Essential hypertension 07/29/2019   HLD (hyperlipidemia)    Panic attacks    Pre-diabetes    Suppurative hidradenitis    Past Surgical History:  Procedure Laterality Date   CHOLECYSTECTOMY N/A 06/28/2023   Procedure: LAPAROSCOPIC CHOLECYSTECTOMY;  Surgeon: Abigail Miyamoto, MD;  Location: [facility] OR;  Service: General;  Laterality: N/A;  TAP BLOCK   INGUINAL HERNIA REPAIR Left 06/28/2023   Procedure: OPEN LEFT INGUINAL HERNIA REPAIR WITH MESH;  Surgeon: Abigail Miyamoto, MD;  Location: [facility] OR;  Service: General;  Laterality: Left;   oral surgery     PELVIC LAPAROSCOPY  1998/1999 `   pelvic adhesions and pain   TOE SURGERY Left    great toe   WISDOM TOOTH EXTRACTION     Patient Active Problem List   Diagnosis Date Noted   Neuropathy 06/22/2023   Influenza vaccine refused 01/04/2021   Inguinal hernia of left side without obstruction or gangrene 04/30/2020   Hyperlipidemia 07/30/2019   Syncope 07/29/2019   Essential hypertension 07/29/2019   Dysmenorrhea 05/08/2018   Herpes 03/21/2014   Suppurative hidradenitis    Depression     PCP: Marcine Matar, MD   REFERRING PROVIDER: Kathryne Hitch*   REFERRING DIAG:  Diagnosis  M24.611 (ICD-10-CM) - Arthrofibrosis of right  shoulder  Z98.890 (ICD-10-CM) - Status post arthroscopy of right shoulder    THERAPY DIAG:  Acute pain of right shoulder  Muscle weakness (generalized)  Localized edema  Rationale for Evaluation and Treatment: Rehabilitation  ONSET DATE: February 2024  SUBJECTIVE:  SUBJECTIVE STATEMENT: Pt indicated consistent pain around 6/10 this morning.  Pt reported feeling pain increases after active movement/ exercise.   PERTINENT HISTORY: At this point I recommended a manipulation under anesthesia followed by an arthroscopic intervention with lysis of adhesions and a subacromial decompression.  Pt s/p on 10/12/23 manipulation c debridement of anterior capsule,  inferior capsule and subacrominal space  PMH: anemia, anxiety, asthma, depression, diverticulosis, essential HTN, panic attacks, pre-diabetes, hernia repair, oral surgery, toe surgery, pelvic laparoscopy, cholecystectomy   PAIN:  NPRS scale: 6/10 upon arrival  Pain location: Rt shoulder- anterior and lateral, some posterior shoulder  Pain description: achy, sore, sharp at times, constant Aggravating factors: lifting my arm, ADL's Relieving factors: resting, pain meds as needed  PRECAUTIONS: None  WEIGHT BEARING RESTRICTIONS: No  FALLS:  Has patient fallen in last 6 months? Yes. Number of falls pt stating no injuries, pt stating she was dehydrated.   LIVING ENVIRONMENT: Lives with: lives with their family and lives alone Lives in: House/apartment Stairs: No Has following equipment at home: None  OCCUPATION: Out of work since May  PLOF: Independent  PATIENT GOALS: be able reach overhead  Next MD visit:   OBJECTIVE:   DIAGNOSTIC FINDINGS:  10/31/2023 review of chart:  IMPRESSION: 1. Mild tendinosis of the supraspinatus tendon  anteriorly. 2. Mild subacromial/subdeltoid bursitis. 3. Mild relative thickening of the inferior joint capsule as can be seen with adhesive capsulitis.  PATIENT SURVEYS:  10/31/23: FOTO intake:  41%     COGNITION: 10/31/2023 Overall cognitive status: WFL     SENSATION: 10/31/2023 Tingling in Rt fingers, pt feels that may be due to B-12 deficiency  POSTURE 10/31/2023 Forward head and rounded shoulders  UPPER EXTREMITY ROM:   ROM (* indicated pain) Right 10/31/23 Left eval Right  PROM 11/07/23 Rt 11/14/23 PROM supine  Shoulder flexion 60 155 90* 110*  Shoulder extension 15 40    Shoulder abduction 65 162 80* 100*  Shoulder adduction      Shoulder internal rotation 50 75 60* 60 *  Shoulder external rotation 25 75 15-20* very guarded and pain limited  30 *  (Blank rows = not tested)  UPPER EXTREMITY MMT:  MMT Right 10/31/2023 Left 10/31/2023  Shoulder flexion 2 5  Shoulder extension 3 5  Shoulder abduction 2 5  Shoulder adduction    Shoulder internal rotation 2 5  Shoulder external rotation 2 5  (Blank rows = not tested)    PALPATION:  10/31/2023 TTP: Rt upper trap, Rt bicep tendons. Rt deltoid, Rt supraspinatus.  TODAY'S TREATMENT:                                                                                                   DATE: 11/16/2023:  TherEx:  UBE UE only Lvl 1.0 for ROM 3 mins forward, 1 min rest, 3 mins backward Supine AAROM 1 lb wand flexion shoulder x 15 2-3 sec hold Supine Active flexion Rt shoulder x 15 Supine 2 lb protraction Rt shoulder 3 sec hold x 10  Standing Green band rows bilateral 2 x 10 Standing Green band GH ext bilateral 2 x 10  Green band Rt shoulder reactive isometric ER step out 5 sec hold x 10 c towel under arm at side.  Manual:  Rt  shoulder GH joint inferior glides G2-g3 in flexion, scaption.  Mobilization c movement c ER passive and posterior glide.  Lat contract/relax into elevation.   TODAY'S TREATMENT:                                                                                                   DATE: 11/14/23:  TherEx:  Shoulder isometrics: flexion, extension, IR and ER all x 15 holding 5 seconds Wall slides using towel x 10  UE ranger Rt UE seated: small circles both directions 2 x 10 UE ranger Rt UE seated: flexion 2 x 10  Supine flexion AAROMc 1# bar  2  x 10  Supine Rt shoulder ER: using 1 # bar AAROM: 2 x 10   Manual:  PROM as tolerated, gentle grade 1-2 GH mobs   TODAY'S TREATMENT:                                                                                                   DATE: 11/07/23 TherEx AAROM flexion with cane supine x15 Isometric shoulder flexion into wall x15 10-25% effort  Isometric shoulder ABD into wall x15 10-25% effort  Isometric shoulder ER into wall, towel tucked under elbow 10% effort  Encouragement regarding recovery from shoulder surgery, course of progression post-op with PT after surgery    Manual PROM to tolerance all directions, gentle grade I to II GH mobs as tolerated   TODAY'S TREATMENT:  DATE: 11/02/23 TherEx Pulleys flexion and scaption x 3 min each Rows L2 band; 2x10 Seated Rt external rotation and flexion table slides 10 x 5 sec hold Supine AA shoulder flexion 1# bar x 10 reps Supine ER AA with 1# bar x 10 reps    PATIENT EDUCATION: Education details: HEP, POC Person educated: Patient Education method: Programmer, multimedia, Facilities manager, Verbal cues, and Handouts Education comprehension: verbalized understanding, returned demonstration, and verbal cues required  HOME EXERCISE PROGRAM: Access Code: [email or web address] URL: [email or web address] Date:  11/14/2023 Prepared by: Narda Amber  Exercises - Standing Shoulder Row with Anchored Resistance  - 2-3 x daily - 7 x weekly - 10 reps - 3 seconds hold - Supine Shoulder Flexion Extension AAROM with Dowel  - 2-3 x daily - 7 x weekly - 2 sets - 10 reps - 2 seconds hold - Supine Shoulder External Rotation in 45 Degrees Abduction AAROM with Dowel  - 2-3 x daily - 7 x weekly - 2 sets - 10 reps - 2 seconds hold - Seated Bilateral Shoulder Flexion Towel Slide at Table Top  - 2-3 x daily - 7 x weekly - 10 reps - 2 seconds hold - Seated Shoulder External Rotation PROM on Table  - 2-3 x daily - 7 x weekly - 10 reps - 2 seconds hold - Isometric Shoulder Flexion at Wall  - 1-2 x daily - 7 x weekly - 2 sets - 10 reps - 5 secibds hold - Isometric Shoulder Extension at Wall  - 1-2 x daily - 7 x weekly - 2 sets - 10 reps - 5 seconds hold - Standing Isometric Shoulder Internal Rotation at Doorway  - 1-2 x daily - 7 x weekly - 2 sets - 10 reps - 5 seconds hold - Standing Isometric Shoulder External Rotation with Doorway  - 1-2 x daily - 7 x weekly - 2 sets - 10 reps - 5 seconds hold  ASSESSMENT:  CLINICAL IMPRESSION: Guarded movement with consistent symptoms noted a rest and in end range progression - both active and passively. Pt has attended 5 visits with some improvements but still needed continued gains in mobility/strength. Continued skilled PT services warranted at this time to continue to address impairments.    OBJECTIVE IMPAIRMENTS: decreased ROM, decreased strength, increased edema, impaired UE functional use, postural dysfunction, and pain.   ACTIVITY LIMITATIONS: carrying, sleeping, bathing, toileting, dressing, reach over head, and hygiene/grooming  PARTICIPATION LIMITATIONS: meal prep, cleaning, laundry, driving, shopping, community activity, and occupation  PERSONAL FACTORS: 3+ comorbidities: see pertinent history  are also affecting patient's functional outcome.   REHAB POTENTIAL:  Good  CLINICAL DECISION MAKING: Stable/uncomplicated  EVALUATION COMPLEXITY: Low   GOALS: Goals reviewed with patient? Yes  SHORT TERM GOALS: (target date for Short term goals are 3 weeks 11/24/23)  1.Patient will demonstrate independent use of home exercise program to maintain progress from in clinic treatments. Goal status: MET 11/14/23  LONG TERM GOALS: (target dates for all long term goals are 10 weeks  01/11/23 )   1. Patient will demonstrate/report pain at worst less than or equal to 2/10 to facilitate minimal limitation in daily activity secondary to pain symptoms. Goal status: on going 11/16/2023   2. Patient will demonstrate independent use of home exercise program to facilitate ability to maintain/progress functional gains from skilled physical therapy services. Goal status: on going 11/16/2023   3. Patient will demonstrate FOTO outcome > or = 61 % to indicate reduced disability due to condition. Goal status: on going  11/16/2023   4.  Patient will demonstrate Rt UE MMT >/= 4/5 throughout to facilitate lifting, reaching, carrying at [facility] in daily activity.   Goal status: on going 11/16/2023   5.  Patient will demonstrate Rt GH joint AROM WFL s symptoms to facilitate usual overhead reaching, self care, dressing at PLOF.    Goal status: on going 11/16/2023   6.  Pt will improve her Rt shoulder flexion to >/= 140 degrees for improvements in ADL's.   Goal status: on going 11/16/2023  7. Pt will improve her Rt shoulder ER to >/= 60 degrees in order to improved functional actives.   Goal Status: on going 11/16/2023     PLAN:  PT FREQUENCY: 1-2x/week  PT DURATION: 10 weeks  PLANNED INTERVENTIONS: Can include 50093- PT Re-evaluation, 97110-Therapeutic exercises, 97530- Therapeutic activity, 97112- Neuromuscular re-education, 97535- Self Care, 97140- Manual therapy, [phone]- Gait training, [phone]- Orthotic Fit/training, [phone]- Canalith repositioning, [id number]- Aquatic  Therapy, 97014- Electrical stimulation (unattended), [id number]- Electrical stimulation (manual), [id number]- Vasopneumatic device, [id number]- Ultrasound, [id number]- Traction (mechanical), [id number]- Ionotophoresis 4mg /ml Dexamethasone, Patient/Family education, Balance training, Stair training, Taping, Dry Needling, Joint mobilization, Joint manipulation, Spinal manipulation, Spinal mobilization, Scar mobilization, Vestibular training, Visual/preceptual remediation/compensation, DME instructions, Cryotherapy, and Moist heat.  All performed as medically necessary.  All included unless contraindicated  PLAN FOR NEXT SESSION: Rt shoulder AAROM/AROM improvements, early strengthening based off symptoms.    Chyrel Masson, PT, DPT, OCS, ATC 11/16/23  9:02 AM

## 2023-11-20 ENCOUNTER — Ambulatory Visit (INDEPENDENT_AMBULATORY_CARE_PROVIDER_SITE_OTHER): Payer: No Typology Code available for payment source | Admitting: Orthopaedic Surgery

## 2023-11-20 ENCOUNTER — Encounter: Payer: Self-pay | Admitting: Orthopaedic Surgery

## 2023-11-20 ENCOUNTER — Other Ambulatory Visit: Payer: Self-pay

## 2023-11-20 DIAGNOSIS — M24611 Ankylosis, right shoulder: Secondary | ICD-10-CM

## 2023-11-20 DIAGNOSIS — Z9889 Other specified postprocedural states: Secondary | ICD-10-CM

## 2023-11-20 DIAGNOSIS — M7541 Impingement syndrome of right shoulder: Secondary | ICD-10-CM

## 2023-11-20 NOTE — Progress Notes (Signed)
The patient is being seen in follow-up for her right shoulder.  We took her to the operating room back in early October last month for a right shoulder manipulation under anesthesia with lysis of adhesions.  We were able to get her fully abducted and fully forward flexed and fully rotated with that shoulder.  We found some inflamed tissue in the shoulder and some scar tissue with a tight anterior capsule but there were no significant intra-articular issues otherwise.  Since then she has been in physical therapy but I had recommended reaching out to Dr. [name] with [facility] again for another intra-articular steroid injection in her right shoulder.  She has not been able to do that as of yet.  She has been physical therapy though.  On my exam her external rotation is almost full and there is not a true block to forward flexion or abduction but she does not let me push her hard.  From my standpoint I would not recommend any further arthroscopic intervention or manipulation.  I do feel that she would best benefit from an ultrasound-guided intra-articular steroid injection again in her right shoulder joint and continued therapy.  We will see if we can get her in sooner with Dr. [name] and then I can see her back in about 3 months to see how she is doing overall.  She agrees with this treatment plan.

## 2023-11-21 ENCOUNTER — Encounter: Payer: Self-pay | Admitting: Physical Therapy

## 2023-11-21 ENCOUNTER — Ambulatory Visit (INDEPENDENT_AMBULATORY_CARE_PROVIDER_SITE_OTHER): Payer: No Typology Code available for payment source | Admitting: Physical Therapy

## 2023-11-21 DIAGNOSIS — R600 Localized edema: Secondary | ICD-10-CM

## 2023-11-21 DIAGNOSIS — M25511 Pain in right shoulder: Secondary | ICD-10-CM

## 2023-11-21 DIAGNOSIS — M6281 Muscle weakness (generalized): Secondary | ICD-10-CM | POA: Diagnosis not present

## 2023-11-21 NOTE — Therapy (Signed)
OUTPATIENT PHYSICAL THERAPY TREATMENT   Patient Name: Misty Bailey MRN: [id number] DOB:08-12-1971, 52 y.o., female Today's Date: 11/21/2023  END OF SESSION:  PT End of Session - 11/21/23 1106     Visit Number 6    Number of Visits 20    Date for PT Re-Evaluation 01/12/24    Progress Note Due on Visit 10    PT Start Time 1101    PT Stop Time 1146    PT Time Calculation (min) 45 min    Activity Tolerance Patient limited by pain    Behavior During Therapy [facility] for tasks assessed/performed                  Past Medical History:  Diagnosis Date   Anemia    Anxiety    Arthritis 02/23/2014   Rib Cage   Asthma    Depression    Diverticulosis    Dysmenorrhea 05/08/2018   Essential hypertension 07/29/2019   HLD (hyperlipidemia)    Panic attacks    Pre-diabetes    Suppurative hidradenitis    Past Surgical History:  Procedure Laterality Date   CHOLECYSTECTOMY N/A 06/28/2023   Procedure: LAPAROSCOPIC CHOLECYSTECTOMY;  Surgeon: Abigail Miyamoto, MD;  Location: [facility] OR;  Service: General;  Laterality: N/A;  TAP BLOCK   INGUINAL HERNIA REPAIR Left 06/28/2023   Procedure: OPEN LEFT INGUINAL HERNIA REPAIR WITH MESH;  Surgeon: Abigail Miyamoto, MD;  Location: [facility] OR;  Service: General;  Laterality: Left;   oral surgery     PELVIC LAPAROSCOPY  1998/1999 `   pelvic adhesions and pain   TOE SURGERY Left    great toe   WISDOM TOOTH EXTRACTION     Patient Active Problem List   Diagnosis Date Noted   Neuropathy 06/22/2023   Influenza vaccine refused 01/04/2021   Inguinal hernia of left side without obstruction or gangrene 04/30/2020   Hyperlipidemia 07/30/2019   Syncope 07/29/2019   Essential hypertension 07/29/2019   Dysmenorrhea 05/08/2018   Herpes 03/21/2014   Suppurative hidradenitis    Depression     PCP: Marcine Matar, MD   REFERRING PROVIDER: Kathryne Hitch*   REFERRING DIAG:  Diagnosis  M24.611 (ICD-10-CM) - Arthrofibrosis of right  shoulder  Z98.890 (ICD-10-CM) - Status post arthroscopy of right shoulder    THERAPY DIAG:  Acute pain of right shoulder  Muscle weakness (generalized)  Localized edema  Rationale for Evaluation and Treatment: Rehabilitation  ONSET DATE: February 2024  SUBJECTIVE:  SUBJECTIVE STATEMENT: Pt arriving to therapy today reporting 7/10 pain in her Rt shoulder. Pt reporting pain on lateral shoulder and into bicep.  PERTINENT HISTORY: At this point I recommended a manipulation under anesthesia followed by an arthroscopic intervention with lysis of adhesions and a subacromial decompression.  Pt s/p on 10/12/23 manipulation c debridement of anterior capsule,  inferior capsule and subacrominal space  PMH: anemia, anxiety, asthma, depression, diverticulosis, essential HTN, panic attacks, pre-diabetes, hernia repair, oral surgery, toe surgery, pelvic laparoscopy, cholecystectomy   PAIN:  NPRS scale: 7/10 upon arrival  Pain location: Rt shoulder- anterior and lateral, some posterior shoulder  Pain description: achy, sore, sharp at times, constant Aggravating factors: lifting my arm, ADL's Relieving factors: resting, pain meds as needed  PRECAUTIONS: None  WEIGHT BEARING RESTRICTIONS: No  FALLS:  Has patient fallen in last 6 months? Yes. Number of falls pt stating no injuries, pt stating she was dehydrated.   LIVING ENVIRONMENT: Lives with: lives with their family and lives alone Lives in: House/apartment Stairs: No Has following equipment at home: None  OCCUPATION: Out of work since May  PLOF: Independent  PATIENT GOALS: be able reach overhead  Next MD visit:   OBJECTIVE:   DIAGNOSTIC FINDINGS:  10/31/2023 review of chart:  IMPRESSION: 1. Mild tendinosis of the supraspinatus tendon  anteriorly. 2. Mild subacromial/subdeltoid bursitis. 3. Mild relative thickening of the inferior joint capsule as can be seen with adhesive capsulitis.  PATIENT SURVEYS:  10/31/23: FOTO intake:  41% 11/21/23: FOTO update 48%     COGNITION: 10/31/2023 Overall cognitive status: WFL     SENSATION: 10/31/2023 Tingling in Rt fingers, pt feels that may be due to B-12 deficiency  POSTURE 10/31/2023 Forward head and rounded shoulders  UPPER EXTREMITY ROM:   ROM (* indicated pain) Right 10/31/23 Left eval Right  PROM 11/07/23 Rt 11/14/23 PROM supine Rt 11/21/23 PROM supine  Shoulder flexion 60 155 90* 110* 108  Shoulder extension 15 40     Shoulder abduction 65 162 80* 100*   Shoulder adduction       Shoulder internal rotation 50 75 60* 60 *   Shoulder external rotation 25 75 15-20* very guarded and pain limited  30 * 30  (Blank rows = not tested)  UPPER EXTREMITY MMT:  MMT Right 10/31/2023 Left 10/31/2023  Shoulder flexion 2 5  Shoulder extension 3 5  Shoulder abduction 2 5  Shoulder adduction    Shoulder internal rotation 2 5  Shoulder external rotation 2 5  (Blank rows = not tested)    PALPATION:  10/31/2023 TTP: Rt upper trap, Rt bicep tendons. Rt deltoid, Rt supraspinatus.  TODAY'S TREATMENT:                                                                                                    11/21/23:  TherEx: UBE: level 1 3 minutes each direction, 1 minute rest break between  Rows: green TB x 15 holding 3 sec Shoulder Ext: red TB x 15 holding 3 sec Placing 1# weight on 1st clinic gym shelf x 10  Wall ladder x 5 scaption Supine AAROM 2 # bar 2 x 10  Sidelying ER 2 x 10  Manual PROM Rt shoulder, grade 2-3 AP GH mobs Modalities:  Ice pack to Rt shoulder x 5  minutes     DATE: 11/16/2023:  TherEx:  UBE UE only Lvl 1.0 for ROM 3 mins forward, 1 min rest, 3 mins backward Supine AAROM 1 lb wand flexion shoulder x 15 2-3 sec hold Supine Active flexion Rt shoulder x 15 Supine 2 lb protraction Rt shoulder 3 sec hold x 10  Standing Green band rows bilateral 2 x 10 Standing Green band GH ext bilateral 2 x 10  Green band Rt shoulder reactive isometric ER step out 5 sec hold x 10 c towel under arm at side.  Manual:  Rt shoulder GH joint inferior glides G2-g3 in flexion, scaption.  Mobilization c movement c ER passive and posterior glide.  Lat contract/relax into elevation.   TODAY'S TREATMENT:                                                                                                   DATE: 11/14/23:  TherEx:  Shoulder isometrics: flexion, extension, IR and ER all x 15 holding 5 seconds Wall slides using towel x 10  UE ranger Rt UE seated: small circles both directions 2 x 10 UE ranger Rt UE seated: flexion 2 x 10  Supine flexion AAROMc 1# bar  2  x 10  Supine Rt shoulder ER: using 1 # bar AAROM: 2 x 10   Manual:  PROM as tolerated, gentle grade 1-2 GH mobs   TODAY'S TREATMENT:                                                                                                   DATE: 11/07/23 TherEx AAROM flexion with cane  supine x15 Isometric shoulder flexion into wall x15 10-25% effort  Isometric shoulder ABD into wall x15 10-25% effort  Isometric shoulder ER into wall, towel tucked under elbow 10% effort  Encouragement regarding recovery from shoulder surgery, course of progression post-op with PT after surgery    Manual PROM to tolerance all directions, gentle grade I to II GH mobs as tolerated       PATIENT EDUCATION: Education details: HEP, POC Person educated: Patient Education method: Programmer, multimedia, Facilities manager, Verbal cues, and Handouts Education comprehension: verbalized understanding, returned demonstration, and verbal  cues required  HOME EXERCISE PROGRAM: Access Code: [email or web address] URL: [email or web address] Date: 11/14/2023 Prepared by: Narda Amber  Exercises - Standing Shoulder Row with Anchored Resistance  - 2-3 x daily - 7 x weekly - 10 reps - 3 seconds hold - Supine Shoulder Flexion Extension AAROM with Dowel  - 2-3 x daily - 7 x weekly - 2 sets - 10 reps - 2 seconds hold - Supine Shoulder External Rotation in 45 Degrees Abduction AAROM with Dowel  - 2-3 x daily - 7 x weekly - 2 sets - 10 reps - 2 seconds hold - Seated Bilateral Shoulder Flexion Towel Slide at Table Top  - 2-3 x daily - 7 x weekly - 10 reps - 2 seconds hold - Seated Shoulder External Rotation PROM on Table  - 2-3 x daily - 7 x weekly - 10 reps - 2 seconds hold - Isometric Shoulder Flexion at Wall  - 1-2 x daily - 7 x weekly - 2 sets - 10 reps - 5 secibds hold - Isometric Shoulder Extension at Wall  - 1-2 x daily - 7 x weekly - 2 sets - 10 reps - 5 seconds hold - Standing Isometric Shoulder Internal Rotation at Doorway  - 1-2 x daily - 7 x weekly - 2 sets - 10 reps - 5 seconds hold - Standing Isometric Shoulder External Rotation with Doorway  - 1-2 x daily - 7 x weekly - 2 sets - 10 reps - 5 seconds hold  ASSESSMENT:  CLINICAL IMPRESSION: Pt has improved her FOTO score to 48%. Pt still presenting with increased pain of 7/10 in her Rt shoulder and difficulty with active movements. Treatment focusing on gentle strengthening and ROM. Recommending continued skilled PT interventions.    OBJECTIVE IMPAIRMENTS: decreased ROM, decreased strength, increased edema, impaired UE functional use, postural dysfunction, and pain.   ACTIVITY LIMITATIONS: carrying, sleeping, bathing, toileting, dressing, reach over head, and hygiene/grooming  PARTICIPATION LIMITATIONS: meal prep, cleaning, laundry, driving, shopping, community activity, and occupation  PERSONAL FACTORS: 3+ comorbidities: see pertinent history  are also affecting  patient's functional outcome.   REHAB POTENTIAL: Good  CLINICAL DECISION MAKING: Stable/uncomplicated  EVALUATION COMPLEXITY: Low   GOALS: Goals reviewed with patient? Yes  SHORT TERM GOALS: (target date for Short term goals are 3 weeks 11/24/23)  1.Patient will demonstrate independent use of home exercise program to maintain progress from in clinic treatments. Goal status: MET 11/14/23  LONG TERM GOALS: (target dates for all long term goals are 10 weeks  01/11/23 )   1. Patient will demonstrate/report pain at worst less than or equal to 2/10 to facilitate minimal limitation in daily activity secondary to pain symptoms. Goal status: on going 11/16/2023   2. Patient will demonstrate independent use of home exercise program to facilitate ability to maintain/progress functional gains from skilled physical therapy services. Goal status: on going 11/16/2023   3. Patient will demonstrate FOTO outcome > or = 61 %  to indicate reduced disability due to condition. Goal status: on going 11/21/2023   4.  Patient will demonstrate Rt UE MMT >/= 4/5 throughout to facilitate lifting, reaching, carrying at [facility] in daily activity.   Goal status: on going 11/16/2023   5.  Patient will demonstrate Rt GH joint AROM WFL s symptoms to facilitate usual overhead reaching, self care, dressing at PLOF.    Goal status: on going 11/16/2023   6.  Pt will improve her Rt shoulder flexion to >/= 140 degrees for improvements in ADL's.  Goal status: on going 11/21/2023  7. Pt will improve her Rt shoulder ER to >/= 60 degrees in order to improved functional actives.   Goal Status: on going 11/16/2023     PLAN:  PT FREQUENCY: 1-2x/week  PT DURATION: 10 weeks  PLANNED INTERVENTIONS: Can include 13086- PT Re-evaluation, 97110-Therapeutic exercises, 97530- Therapeutic activity, 97112- Neuromuscular re-education, 97535- Self Care, 97140- Manual therapy, [phone]- Gait training, [phone]- Orthotic Fit/training,  [phone]- Canalith repositioning, [id number]- Aquatic Therapy, 97014- Electrical stimulation (unattended), [id number]- Electrical stimulation (manual), [id number]- Vasopneumatic device, [id number]- Ultrasound, [id number]- Traction (mechanical), [id number]- Ionotophoresis 4mg /ml Dexamethasone, Patient/Family education, Balance training, Stair training, Taping, Dry Needling, Joint mobilization, Joint manipulation, Spinal manipulation, Spinal mobilization, Scar mobilization, Vestibular training, Visual/preceptual remediation/compensation, DME instructions, Cryotherapy, and Moist heat.  All performed as medically necessary.  All included unless contraindicated  PLAN FOR NEXT SESSION: Rt shoulder AAROM/AROM improvements, early strengthening based off symptoms.    Narda Amber, PT, MPT 11/21/23 11:47 AM   11/21/23  11:47 AM

## 2023-11-28 ENCOUNTER — Ambulatory Visit: Payer: No Typology Code available for payment source | Admitting: Family Medicine

## 2023-11-28 ENCOUNTER — Other Ambulatory Visit: Payer: Self-pay

## 2023-11-28 VITALS — BP 158/96 | HR 74 | Ht 61.0 in | Wt 147.0 lb

## 2023-11-28 DIAGNOSIS — G8929 Other chronic pain: Secondary | ICD-10-CM | POA: Diagnosis not present

## 2023-11-28 DIAGNOSIS — M7501 Adhesive capsulitis of right shoulder: Secondary | ICD-10-CM

## 2023-11-28 DIAGNOSIS — M25511 Pain in right shoulder: Secondary | ICD-10-CM | POA: Diagnosis not present

## 2023-11-28 DIAGNOSIS — E538 Deficiency of other specified B group vitamins: Secondary | ICD-10-CM | POA: Insufficient documentation

## 2023-11-28 NOTE — Patient Instructions (Addendum)
Thank you for coming in today.   You received an injection today. Seek immediate medical attention if the joint becomes red, extremely painful, or is oozing fluid.   Continue physical therapy  Let me know how you do.

## 2023-11-28 NOTE — Progress Notes (Signed)
   [name], PhD, LAT, ATC acting as a scribe for [name], MD.  Misty Bailey is a 52 y.o. female who presents to [facility] today for cont'd R shoulder pain. Pt underwent R shoulder manipulation under anesthesia w/ lysis of adhesions mid-Oct w/ Dr. [name].  Today, pt reports slight improvement w/ her shoulder flexion, but is still limited w/ IR. She is currently doing PT at [facility]. She rates her pain at a 6-7/10.  Dx testing: 06/19/23 C-spine & R shoulder MRI              06/15/23 NCV study 05/24/23 R shoulder & C-spine XR             05/15/23 Labs   Pertinent review of systems: No fevers or chills  Relevant historical information: Adhesive capsulitis right shoulder.  Vitamin B12 deficiency.   Exam:  BP (!) 158/96   Pulse 74   Ht 5\' 1"  (1.549 m)   Wt 147 lb (66.7 kg)   SpO2 98%   BMI 27.78 kg/m  General: Well Developed, well nourished, and in no acute distress.   MSK: Right shoulder normal-appearing with mature scar.  Decreased range of motion abduction and functional internal rotation.    Lab and Radiology Results  Procedure: Real-time Ultrasound Guided Injection of the right shoulder glenohumeral joint posterior approach Device: Philips Affiniti 50G/GE Logiq Images permanently stored and available for review in PACS Verbal informed consent obtained.  Discussed risks and benefits of procedure. Warned about infection, bleeding, hyperglycemia damage to structures among others. Patient expresses understanding and agreement Time-out conducted.   Noted no overlying erythema, induration, or other signs of local infection.   Skin prepped in a sterile fashion.   Local anesthesia: Topical Ethyl chloride.   With sterile technique and under real time ultrasound guidance: 40 mg of Kenalog and 2 mL of Marcaine injected into glenohumeral joint. Fluid seen entering the joint capsule.   Completed without difficulty   Pain  immediately resolved suggesting accurate placement of the medication.   Advised to call if fevers/chills, erythema, induration, drainage, or persistent bleeding.   Images permanently stored and available for review in the ultrasound unit.  Impression: Technically successful ultrasound guided injection.        Assessment and Plan: 52 y.o. female with right shoulder pain thought to be due to adhesive capsulitis.  Plan for repeat steroid injection today.  This agrees with Dr. Eliberto Ivory plans as well.  Continue physical therapy and recheck back as needed.Marland Kitchen    PDMP not reviewed this encounter. Orders Placed This Encounter  Procedures   [place] LIMITED JOINT SPACE STRUCTURES UP RIGHT(NO LINKED CHARGES)    Order Specific Question:   Reason for Exam (SYMPTOM  OR DIAGNOSIS REQUIRED)    Answer:   right shoulder pain    Order Specific Question:   Preferred imaging location?    Answer:   [facility]   No orders of the defined types were placed in this encounter.    Discussed warning signs or symptoms. Please see discharge instructions. Patient expresses understanding.   The above documentation has been reviewed and is accurate and complete [name], M.D.

## 2023-11-29 ENCOUNTER — Encounter: Payer: Self-pay | Admitting: Rehabilitative and Restorative Service Providers"

## 2023-11-29 ENCOUNTER — Ambulatory Visit: Payer: No Typology Code available for payment source | Admitting: Rehabilitative and Restorative Service Providers"

## 2023-11-29 DIAGNOSIS — M25511 Pain in right shoulder: Secondary | ICD-10-CM | POA: Diagnosis not present

## 2023-11-29 DIAGNOSIS — R600 Localized edema: Secondary | ICD-10-CM

## 2023-11-29 DIAGNOSIS — M6281 Muscle weakness (generalized): Secondary | ICD-10-CM

## 2023-11-29 NOTE — Therapy (Signed)
OUTPATIENT PHYSICAL THERAPY TREATMENT   Patient Name: Misty Bailey MRN: [id number] DOB:1971-11-06, 52 y.o., female Today's Date: 11/29/2023  END OF SESSION:  PT End of Session - 11/29/23 1052     Visit Number 7    Number of Visits 20    Date for PT Re-Evaluation 01/12/24    Authorization Type [employer] $40 copay    Authorization - Number of Visits 57    Progress Note Due on Visit 10    PT Start Time 1056    PT Stop Time 1137    PT Time Calculation (min) 41 min    Activity Tolerance Patient limited by pain    Behavior During Therapy [place] for tasks assessed/performed                   Past Medical History:  Diagnosis Date   Anemia    Anxiety    Arthritis 02/23/2014   Rib Cage   Asthma    Depression    Diverticulosis    Dysmenorrhea 05/08/2018   Essential hypertension 07/29/2019   HLD (hyperlipidemia)    Panic attacks    Pre-diabetes    Suppurative hidradenitis    Past Surgical History:  Procedure Laterality Date   CHOLECYSTECTOMY N/A 06/28/2023   Procedure: LAPAROSCOPIC CHOLECYSTECTOMY;  Surgeon: Abigail Miyamoto, MD;  Location: [facility] OR;  Service: General;  Laterality: N/A;  TAP BLOCK   INGUINAL HERNIA REPAIR Left 06/28/2023   Procedure: OPEN LEFT INGUINAL HERNIA REPAIR WITH MESH;  Surgeon: Abigail Miyamoto, MD;  Location: [facility];  Service: General;  Laterality: Left;   oral surgery     PELVIC LAPAROSCOPY  1998/1999 `   pelvic adhesions and pain   TOE SURGERY Left    great toe   WISDOM TOOTH EXTRACTION     Patient Active Problem List   Diagnosis Date Noted   Adhesive capsulitis of right shoulder 11/28/2023   B12 deficiency 11/28/2023   Neuropathy 06/22/2023   Inguinal hernia of left side without obstruction or gangrene 04/30/2020   Hyperlipidemia 07/30/2019   Syncope 07/29/2019   Essential hypertension 07/29/2019   Dysmenorrhea 05/08/2018   Herpes 03/21/2014   Suppurative hidradenitis    Depression     PCP: Marcine Matar, MD   REFERRING  PROVIDER: Rodolph Bong, MD   REFERRING DIAG:  Diagnosis  M24.611 (ICD-10-CM) - Arthrofibrosis of right shoulder  Z98.890 (ICD-10-CM) - Status post arthroscopy of right shoulder    THERAPY DIAG:  Acute pain of right shoulder  Muscle weakness (generalized)  Localized edema  Rationale for Evaluation and Treatment: Rehabilitation  ONSET DATE: February 2024  SUBJECTIVE:  SUBJECTIVE STATEMENT: Pt indicated ok today, about a 5/10.  Reported yesterday was more pain, felt she had to do a lot (rearranging kitchen).    PERTINENT HISTORY: At this point I recommended a manipulation under anesthesia followed by an arthroscopic intervention with lysis of adhesions and a subacromial decompression.  Pt s/p on 10/12/23 manipulation c debridement of anterior capsule,  inferior capsule and subacrominal space  PMH: anemia, anxiety, asthma, depression, diverticulosis, essential HTN, panic attacks, pre-diabetes, hernia repair, oral surgery, toe surgery, pelvic laparoscopy, cholecystectomy   PAIN:  NPRS scale: 5/10 Pain location: Rt shoulder- anterior and lateral, some posterior shoulder  Pain description: achy, sore, sharp at times, constant Aggravating factors: lifting my arm, ADL's Relieving factors: resting, pain meds as needed  PRECAUTIONS: None  WEIGHT BEARING RESTRICTIONS: No  FALLS:  Has patient fallen in last 6 months? Yes. Number of falls pt stating no injuries, pt stating she was dehydrated.   LIVING ENVIRONMENT: Lives with: lives with their family and lives alone Lives in: House/apartment Stairs: No Has following equipment at home: None  OCCUPATION: Out of work since May  PLOF: Independent  PATIENT GOALS: be able reach overhead   OBJECTIVE:   DIAGNOSTIC FINDINGS:  10/31/2023 review of  chart:  IMPRESSION: 1. Mild tendinosis of the supraspinatus tendon anteriorly. 2. Mild subacromial/subdeltoid bursitis. 3. Mild relative thickening of the inferior joint capsule as can be seen with adhesive capsulitis.  PATIENT SURVEYS:  10/31/23: FOTO intake:  41% 11/21/23: FOTO update 48%     COGNITION: 10/31/2023 Overall cognitive status: WFL     SENSATION: 10/31/2023 Tingling in Rt fingers, pt feels that may be due to B-12 deficiency  POSTURE 10/31/2023 Forward head and rounded shoulders  UPPER EXTREMITY ROM:   ROM (* indicated pain) Right 10/31/23 Left eval Right  PROM 11/07/23 Rt 11/14/23 PROM supine Rt 11/21/23 PROM supine Rt 11/29/2023 AROM  Shoulder flexion 60 155 90* 110* 108 110  Shoulder extension 15 40      Shoulder abduction 65 162 80* 100*    Shoulder adduction        Shoulder internal rotation 50 75 60* 60 *    Shoulder external rotation 25 75 15-20* very guarded and pain limited  30 * 30   (Blank rows = not tested)  UPPER EXTREMITY MMT:  MMT Right 10/31/2023 Left 10/31/2023  Shoulder flexion 2 5  Shoulder extension 3 5  Shoulder abduction 2 5  Shoulder adduction    Shoulder internal rotation 2 5  Shoulder external rotation 2 5  (Blank rows = not tested)    PALPATION:  10/31/2023 TTP: Rt upper trap, Rt bicep tendons. Rt deltoid, Rt supraspinatus.  TODAY'S TREATMENT:                                                                                            DATE: 11/29/2023:  TherEx:  Pulley flexion with outstretch arm, eccentric lowing focus with Rt arm 3 mins with 2-3 sec stretch hold. Pulley scaption  with outstretch arm, eccentric lowing focus with Rt arm 3 mins with 2-3 sec stretch hold. Sleeper stretch Rt shoulder 20 sec x 3 Supine cross arm  posterior capsule stretch 20 sec x 3 Rt Supine Rt shoulder flexion AROM 1 lb x 15 Sidelying Rt shoulder abduction AROM x 15  Sidelying Rt shoulder ER c towel under arm x 15 AROM  Review of HEP adjustments due to progress into AROM.   Manual:  Rt shoulder GH joint inferior glides G2-g3 in flexion, scaption.  Mobilization c movement c ER passive and posterior glide.  Lat contract/relax into elevation.   TODAY'S TREATMENT:                                                                                            DATE:  11/21/23:  TherEx: UBE: level 1 3 minutes each direction, 1 minute rest break between  Rows: green TB x 15 holding 3 sec Shoulder Ext: red TB x 15 holding 3 sec Placing 1# weight on 1st clinic gym shelf x 10  Wall ladder x 5 scaption Supine AAROM 2 # bar 2 x 10  Sidelying ER 2 x 10   Manual PROM Rt shoulder, grade 2-3 AP GH mobs  Modalities:  Ice pack to Rt shoulder x 5 minutes  TODAY'S TREATMENT:                                                                                            DATE: 11/16/2023:  TherEx:  UBE UE only Lvl 1.0 for ROM 3 mins forward, 1 min rest, 3 mins backward Supine AAROM 1 lb wand flexion shoulder x 15 2-3 sec hold Supine Active flexion Rt shoulder x 15 Supine 2 lb protraction Rt shoulder 3 sec hold x 10  Standing Green band rows bilateral 2 x 10 Standing Green band GH ext bilateral 2 x 10  Green band Rt shoulder reactive isometric ER step out 5 sec hold x 10 c towel under arm at side.  Manual:  Rt shoulder GH joint inferior glides G2-g3 in flexion,  scaption.  Mobilization c movement c ER passive and posterior glide.  Lat contract/relax into elevation.   TODAY'S TREATMENT:                                                                                                   DATE: 11/14/23:  TherEx:  Shoulder isometrics: flexion, extension, IR and ER all x 15 holding 5 seconds Wall slides using towel x 10  UE ranger Rt UE seated: small  circles both directions 2 x 10 UE ranger Rt UE seated: flexion 2 x 10  Supine flexion AAROMc 1# bar  2  x 10  Supine Rt shoulder ER: using 1 # bar AAROM: 2 x 10   Manual:  PROM as tolerated, gentle grade 1-2 GH mobs    PATIENT EDUCATION: 11/29/2023 Education details: HEP update Person educated: Patient Education method: Programmer, multimedia, Facilities manager, Verbal cues, and Handouts Education comprehension: verbalized understanding, returned demonstration, and verbal cues required  HOME EXERCISE PROGRAM: Access Code: [email or web address] URL: [email or web address] Date: 11/29/2023 Prepared by: Chyrel Masson  Exercises - Supine Shoulder Flexion Extension AAROM with Dowel  - 2-3 x daily - 7 x weekly - 2 sets - 10 reps - 2 seconds hold - Supine Shoulder External Rotation in 45 Degrees Abduction AAROM with Dowel (Mirrored)  - 2-3 x daily - 7 x weekly - 2 sets - 10 reps - 2 seconds hold - Standing Shoulder Posterior Capsule Stretch (Mirrored)  - 2-3 x daily - 7 x weekly - 1 sets - 3 reps - 15-30 hold - Sleeper Stretch (Mirrored)  - 2-3 x daily - 7 x weekly - 1 sets - 3 reps - 30 hold - Isometric Shoulder Flexion at Wall  - 1-2 x daily - 7 x weekly - 1 sets - 10 reps - 5 secibds hold - Standing Isometric Shoulder Internal Rotation at Doorway  - 1-2 x daily - 7 x weekly - 1 sets - 10 reps - 5 seconds hold - Standing Isometric Shoulder External Rotation with Doorway  - 1-2 x daily - 7 x weekly - 1 sets - 10 reps - 5 seconds hold - Standing Shoulder Row with Anchored Resistance  - 1 x daily - 7 x weekly - 1-2 sets - 10-15 reps - Shoulder Extension with Resistance  - 1 x daily - 7 x weekly - 1-2 sets - 10-15 reps  ASSESSMENT:  CLINICAL IMPRESSION: Pt demonstrated improvement in tolerance to active range in gravity reduced positioning.   May continue to benefit from progressive active range improvements, manual for mobility.    OBJECTIVE IMPAIRMENTS: decreased ROM, decreased strength, increased  edema, impaired UE functional use, postural dysfunction, and pain.   ACTIVITY LIMITATIONS: carrying, sleeping, bathing, toileting, dressing, reach over head, and hygiene/grooming  PARTICIPATION LIMITATIONS: meal prep, cleaning, laundry, driving, shopping, community activity, and occupation  PERSONAL FACTORS: 3+ comorbidities: see pertinent history  are also affecting patient's functional outcome.   REHAB POTENTIAL: Good  CLINICAL DECISION MAKING: Stable/uncomplicated  EVALUATION COMPLEXITY: Low   GOALS: Goals reviewed with patient? Yes  SHORT TERM GOALS: (target date for Short term  goals are 3 weeks 11/24/23)  1.Patient will demonstrate independent use of home exercise program to maintain progress from in clinic treatments. Goal status: MET 11/14/23  LONG TERM GOALS: (target dates for all long term goals are 10 weeks  01/11/23 )   1. Patient will demonstrate/report pain at worst less than or equal to 2/10 to facilitate minimal limitation in daily activity secondary to pain symptoms. Goal status: on going 11/16/2023   2. Patient will demonstrate independent use of home exercise program to facilitate ability to maintain/progress functional gains from skilled physical therapy services. Goal status: on going 11/16/2023   3. Patient will demonstrate FOTO outcome > or = 61 % to indicate reduced disability due to condition. Goal status: on going 11/21/2023   4.  Patient will demonstrate Rt UE MMT >/= 4/5 throughout to facilitate lifting, reaching, carrying at [facility] in daily activity.   Goal status: on going 11/16/2023   5.  Patient will demonstrate Rt GH joint AROM WFL s symptoms to facilitate usual overhead reaching, self care, dressing at PLOF.    Goal status: on going 11/16/2023   6.  Pt will improve her Rt shoulder flexion to >/= 140 degrees for improvements in ADL's.  Goal status: on going 11/21/2023  7. Pt will improve her Rt shoulder ER to >/= 60 degrees in order to  improved functional actives.   Goal Status: on going 11/16/2023     PLAN:  PT FREQUENCY: 1-2x/week  PT DURATION: 10 weeks  PLANNED INTERVENTIONS: Can include 24401- PT Re-evaluation, 97110-Therapeutic exercises, 97530- Therapeutic activity, 97112- Neuromuscular re-education, 97535- Self Care, 97140- Manual therapy, [phone]- Gait training, [phone]- Orthotic Fit/training, [phone]- Canalith repositioning, [id number]- Aquatic Therapy, 97014- Electrical stimulation (unattended), [id number]- Electrical stimulation (manual), [id number]- Vasopneumatic device, [id number]- Ultrasound, [id number]- Traction (mechanical), [id number]- Ionotophoresis 4mg /ml Dexamethasone, Patient/Family education, Balance training, Stair training, Taping, Dry Needling, Joint mobilization, Joint manipulation, Spinal manipulation, Spinal mobilization, Scar mobilization, Vestibular training, Visual/preceptual remediation/compensation, DME instructions, Cryotherapy, and Moist heat.  All performed as medically necessary.  All included unless contraindicated  PLAN FOR NEXT SESSION: Rt shoulder AROM in gravity reduced positioning to avoid shrug.    Chyrel Masson, PT, DPT, OCS, ATC 11/29/23  11:50 AM

## 2023-12-01 ENCOUNTER — Encounter: Payer: No Typology Code available for payment source | Admitting: Rehabilitative and Restorative Service Providers"

## 2023-12-01 NOTE — Therapy (Incomplete)
OUTPATIENT PHYSICAL THERAPY TREATMENT   Patient Name: Misty Bailey MRN: [id number] DOB:02/21/1971, 52 y.o., female Today's Date: 12/01/2023  END OF SESSION:          Past Medical History:  Diagnosis Date   Anemia    Anxiety    Arthritis 02/23/2014   Rib Cage   Asthma    Depression    Diverticulosis    Dysmenorrhea 05/08/2018   Essential hypertension 07/29/2019   HLD (hyperlipidemia)    Panic attacks    Pre-diabetes    Suppurative hidradenitis    Past Surgical History:  Procedure Laterality Date   CHOLECYSTECTOMY N/A 06/28/2023   Procedure: LAPAROSCOPIC CHOLECYSTECTOMY;  Surgeon: Abigail Miyamoto, MD;  Location: [facility] OR;  Service: General;  Laterality: N/A;  TAP BLOCK   INGUINAL HERNIA REPAIR Left 06/28/2023   Procedure: OPEN LEFT INGUINAL HERNIA REPAIR WITH MESH;  Surgeon: Abigail Miyamoto, MD;  Location: [facility] OR;  Service: General;  Laterality: Left;   oral surgery     PELVIC LAPAROSCOPY  1998/1999 `   pelvic adhesions and pain   TOE SURGERY Left    great toe   WISDOM TOOTH EXTRACTION     Patient Active Problem List   Diagnosis Date Noted   Adhesive capsulitis of right shoulder 11/28/2023   B12 deficiency 11/28/2023   Neuropathy 06/22/2023   Inguinal hernia of left side without obstruction or gangrene 04/30/2020   Hyperlipidemia 07/30/2019   Syncope 07/29/2019   Essential hypertension 07/29/2019   Dysmenorrhea 05/08/2018   Herpes 03/21/2014   Suppurative hidradenitis    Depression     PCP: Marcine Matar, MD   REFERRING PROVIDER: Kathryne Hitch*   REFERRING DIAG:  Diagnosis  M24.611 (ICD-10-CM) - Arthrofibrosis of right shoulder  Z98.890 (ICD-10-CM) - Status post arthroscopy of right shoulder    THERAPY DIAG:  No diagnosis found.  Rationale for Evaluation and Treatment: Rehabilitation  ONSET DATE: February 2024  SUBJECTIVE:                                                                                                                                                                                       SUBJECTIVE STATEMENT: Pt indicated ok today, about a 5/10.  Reported yesterday was more pain, felt she had to do a lot (rearranging kitchen).    PERTINENT HISTORY: At this point I recommended a manipulation under anesthesia followed by an arthroscopic intervention with lysis of adhesions and a subacromial decompression.  Pt s/p on 10/12/23 manipulation c debridement of anterior capsule,  inferior capsule and subacrominal space  PMH: anemia, anxiety, asthma, depression, diverticulosis, essential HTN, panic attacks, pre-diabetes, hernia repair, oral surgery, toe surgery,  pelvic laparoscopy, cholecystectomy   PAIN:  NPRS scale: 5/10 Pain location: Rt shoulder- anterior and lateral, some posterior shoulder  Pain description: achy, sore, sharp at times, constant Aggravating factors: lifting my arm, ADL's Relieving factors: resting, pain meds as needed  PRECAUTIONS: None  WEIGHT BEARING RESTRICTIONS: No  FALLS:  Has patient fallen in last 6 months? Yes. Number of falls pt stating no injuries, pt stating she was dehydrated.   LIVING ENVIRONMENT: Lives with: lives with their family and lives alone Lives in: House/apartment Stairs: No Has following equipment at home: None  OCCUPATION: Out of work since May  PLOF: Independent  PATIENT GOALS: be able reach overhead   OBJECTIVE:   DIAGNOSTIC FINDINGS:  10/31/2023 review of chart:  IMPRESSION: 1. Mild tendinosis of the supraspinatus tendon anteriorly. 2. Mild subacromial/subdeltoid bursitis. 3. Mild relative thickening of the inferior joint capsule as can be seen with adhesive capsulitis.  PATIENT SURVEYS:  11/21/23: FOTO update 48%  10/31/23: FOTO intake:  41%      COGNITION: 10/31/2023 Overall cognitive status: WFL     SENSATION: 10/31/2023 Tingling in Rt fingers, pt feels that may be due to B-12 deficiency  POSTURE 10/31/2023 Forward head  and rounded shoulders  UPPER EXTREMITY ROM:   ROM (* indicated pain) Right 10/31/23 Left eval Right  PROM 11/07/23 Rt 11/14/23 PROM supine Rt 11/21/23 PROM supine Rt 11/29/2023 AROM  Shoulder flexion 60 155 90* 110* 108 110  Shoulder extension 15 40      Shoulder abduction 65 162 80* 100*    Shoulder adduction        Shoulder internal rotation 50 75 60* 60 *    Shoulder external rotation 25 75 15-20* very guarded and pain limited  30 * 30   (Blank rows = not tested)  UPPER EXTREMITY MMT:  MMT Right 10/31/2023 Left 10/31/2023  Shoulder flexion 2 5  Shoulder extension 3 5  Shoulder abduction 2 5  Shoulder adduction    Shoulder internal rotation 2 5  Shoulder external rotation 2 5  (Blank rows = not tested)    PALPATION:  10/31/2023 TTP: Rt upper trap, Rt bicep tendons. Rt deltoid, Rt supraspinatus.                                                                                                                                                                                                  TODAY'S TREATMENT:  DATE: 12/01/2023:  TherEx:  Pulley flexion with outstretch arm, eccentric lowing focus with Rt arm 3 mins with 2-3 sec stretch hold. Pulley scaption  with outstretch arm, eccentric lowing focus with Rt arm 3 mins with 2-3 sec stretch hold. Sleeper stretch Rt shoulder 20 sec x 3 Supine cross arm posterior capsule stretch 20 sec x 3 Rt Supine Rt shoulder flexion AROM 1 lb x 15 Sidelying Rt shoulder abduction AROM x 15  Sidelying Rt shoulder ER c towel under arm x 15 AROM  Review of HEP adjustments due to progress into AROM.   Manual:  Rt shoulder GH joint inferior glides G2-g3 in flexion, scaption.  Mobilization c movement c ER passive and posterior glide.  Lat contract/relax into elevation.   TODAY'S TREATMENT:                                                                                             DATE: 11/29/2023:  TherEx:  Pulley flexion with outstretch arm, eccentric lowing focus with Rt arm 3 mins with 2-3 sec stretch hold. Pulley scaption  with outstretch arm, eccentric lowing focus with Rt arm 3 mins with 2-3 sec stretch hold. Sleeper stretch Rt shoulder 20 sec x 3 Supine cross arm posterior capsule stretch 20 sec x 3 Rt Supine Rt shoulder flexion AROM 1 lb x 15 Sidelying Rt shoulder abduction AROM x 15  Sidelying Rt shoulder ER c towel under arm x 15 AROM  Review of HEP adjustments due to progress into AROM.   Manual:  Rt shoulder GH joint inferior glides G2-g3 in flexion, scaption.  Mobilization c movement c ER passive and posterior glide.  Lat contract/relax into elevation.   TODAY'S TREATMENT:                                                                                            DATE:  11/21/23:  TherEx: UBE: level 1 3 minutes each direction, 1 minute rest break between  Rows: green TB x 15 holding 3 sec Shoulder Ext: red TB x 15 holding 3 sec Placing 1# weight on 1st clinic gym shelf x 10  Wall ladder x 5 scaption Supine AAROM 2 # bar 2 x 10  Sidelying ER 2 x 10   Manual PROM Rt shoulder, grade 2-3 AP GH mobs  Modalities:  Ice pack to Rt shoulder x 5 minutes  TODAY'S TREATMENT:  DATE: 11/16/2023:  TherEx:  UBE UE only Lvl 1.0 for ROM 3 mins forward, 1 min rest, 3 mins backward Supine AAROM 1 lb wand flexion shoulder x 15 2-3 sec hold Supine Active flexion Rt shoulder x 15 Supine 2 lb protraction Rt shoulder 3 sec hold x 10  Standing Green band rows bilateral 2 x 10 Standing Green band GH ext bilateral 2 x 10  Green band Rt shoulder reactive isometric ER step out 5 sec hold x 10 c towel under arm at side.  Manual:  Rt shoulder GH joint inferior glides G2-g3 in flexion, scaption.  Mobilization c movement c ER passive and posterior glide.  Lat  contract/relax into elevation.   PATIENT EDUCATION: 11/29/2023 Education details: HEP update Person educated: Patient Education method: Programmer, multimedia, Demonstration, Verbal cues, and Handouts Education comprehension: verbalized understanding, returned demonstration, and verbal cues required  HOME EXERCISE PROGRAM: Access Code: [email or web address] URL: [email or web address] Date: 11/29/2023 Prepared by: Chyrel Masson  Exercises - Supine Shoulder Flexion Extension AAROM with Dowel  - 2-3 x daily - 7 x weekly - 2 sets - 10 reps - 2 seconds hold - Supine Shoulder External Rotation in 45 Degrees Abduction AAROM with Dowel (Mirrored)  - 2-3 x daily - 7 x weekly - 2 sets - 10 reps - 2 seconds hold - Standing Shoulder Posterior Capsule Stretch (Mirrored)  - 2-3 x daily - 7 x weekly - 1 sets - 3 reps - 15-30 hold - Sleeper Stretch (Mirrored)  - 2-3 x daily - 7 x weekly - 1 sets - 3 reps - 30 hold - Isometric Shoulder Flexion at Wall  - 1-2 x daily - 7 x weekly - 1 sets - 10 reps - 5 secibds hold - Standing Isometric Shoulder Internal Rotation at Doorway  - 1-2 x daily - 7 x weekly - 1 sets - 10 reps - 5 seconds hold - Standing Isometric Shoulder External Rotation with Doorway  - 1-2 x daily - 7 x weekly - 1 sets - 10 reps - 5 seconds hold - Standing Shoulder Row with Anchored Resistance  - 1 x daily - 7 x weekly - 1-2 sets - 10-15 reps - Shoulder Extension with Resistance  - 1 x daily - 7 x weekly - 1-2 sets - 10-15 reps  ASSESSMENT:  CLINICAL IMPRESSION: Pt demonstrated improvement in tolerance to active range in gravity reduced positioning.   May continue to benefit from progressive active range improvements, manual for mobility.    OBJECTIVE IMPAIRMENTS: decreased ROM, decreased strength, increased edema, impaired UE functional use, postural dysfunction, and pain.   ACTIVITY LIMITATIONS: carrying, sleeping, bathing, toileting, dressing, reach over head, and  hygiene/grooming  PARTICIPATION LIMITATIONS: meal prep, cleaning, laundry, driving, shopping, community activity, and occupation  PERSONAL FACTORS: 3+ comorbidities: see pertinent history  are also affecting patient's functional outcome.   REHAB POTENTIAL: Good  CLINICAL DECISION MAKING: Stable/uncomplicated  EVALUATION COMPLEXITY: Low   GOALS: Goals reviewed with patient? Yes  SHORT TERM GOALS: (target date for Short term goals are 3 weeks 11/24/23)  1.Patient will demonstrate independent use of home exercise program to maintain progress from in clinic treatments. Goal status: MET 11/14/23  LONG TERM GOALS: (target dates for all long term goals are 10 weeks  01/11/23 )   1. Patient will demonstrate/report pain at worst less than or equal to 2/10 to facilitate minimal limitation in daily activity secondary to pain symptoms. Goal status: on going 11/16/2023   2. Patient will demonstrate independent use of  home exercise program to facilitate ability to maintain/progress functional gains from skilled physical therapy services. Goal status: on going 11/16/2023   3. Patient will demonstrate FOTO outcome > or = 61 % to indicate reduced disability due to condition. Goal status: on going 11/21/2023   4.  Patient will demonstrate Rt UE MMT >/= 4/5 throughout to facilitate lifting, reaching, carrying at [facility] in daily activity.   Goal status: on going 11/16/2023   5.  Patient will demonstrate Rt GH joint AROM WFL s symptoms to facilitate usual overhead reaching, self care, dressing at PLOF.    Goal status: on going 11/16/2023   6.  Pt will improve her Rt shoulder flexion to >/= 140 degrees for improvements in ADL's.  Goal status: on going 11/21/2023  7. Pt will improve her Rt shoulder ER to >/= 60 degrees in order to improved functional actives.   Goal Status: on going 11/16/2023     PLAN:  PT FREQUENCY: 1-2x/week  PT DURATION: 10 weeks  PLANNED INTERVENTIONS: Can include  16109- PT Re-evaluation, 97110-Therapeutic exercises, 97530- Therapeutic activity, 97112- Neuromuscular re-education, 97535- Self Care, 97140- Manual therapy, [phone]- Gait training, [phone]- Orthotic Fit/training, [phone]- Canalith repositioning, [id number]- Aquatic Therapy, 97014- Electrical stimulation (unattended), [id number]- Electrical stimulation (manual), [id number]- Vasopneumatic device, [id number]- Ultrasound, [id number]- Traction (mechanical), [id number]- Ionotophoresis 4mg /ml Dexamethasone, Patient/Family education, Balance training, Stair training, Taping, Dry Needling, Joint mobilization, Joint manipulation, Spinal manipulation, Spinal mobilization, Scar mobilization, Vestibular training, Visual/preceptual remediation/compensation, DME instructions, Cryotherapy, and Moist heat.  All performed as medically necessary.  All included unless contraindicated  PLAN FOR NEXT SESSION: Rt shoulder AROM in gravity reduced positioning to avoid shrug.    Chyrel Masson, PT, DPT, OCS, ATC 12/01/23  7:41 AM

## 2023-12-05 ENCOUNTER — Ambulatory Visit (INDEPENDENT_AMBULATORY_CARE_PROVIDER_SITE_OTHER): Payer: No Typology Code available for payment source | Admitting: Physical Therapy

## 2023-12-05 ENCOUNTER — Encounter: Payer: Self-pay | Admitting: Physical Therapy

## 2023-12-05 DIAGNOSIS — M25511 Pain in right shoulder: Secondary | ICD-10-CM

## 2023-12-05 DIAGNOSIS — M6281 Muscle weakness (generalized): Secondary | ICD-10-CM | POA: Diagnosis not present

## 2023-12-05 DIAGNOSIS — R600 Localized edema: Secondary | ICD-10-CM

## 2023-12-05 NOTE — Therapy (Signed)
OUTPATIENT PHYSICAL THERAPY TREATMENT   Patient Name: Misty Bailey MRN: [id number] DOB:[birth year], 52 y.o., female Today's Date: 12/05/2023  END OF SESSION:  PT End of Session - 12/05/23 1214     Visit Number 8    Number of Visits 20    Date for PT Re-Evaluation 01/12/24    Progress Note Due on Visit 10    PT Start Time 1100    PT Stop Time 1138    PT Time Calculation (min) 38 min    Activity Tolerance Patient limited by pain    Behavior During Therapy [facility] for tasks assessed/performed                    Past Medical History:  Diagnosis Date   Anemia    Anxiety    Arthritis 02/23/2014   Rib Cage   Asthma    Depression    Diverticulosis    Dysmenorrhea 05/08/2018   Essential hypertension 07/29/2019   HLD (hyperlipidemia)    Panic attacks    Pre-diabetes    Suppurative hidradenitis    Past Surgical History:  Procedure Laterality Date   CHOLECYSTECTOMY N/A 06/28/2023   Procedure: LAPAROSCOPIC CHOLECYSTECTOMY;  Surgeon: Abigail Miyamoto, MD;  Location: [facility] OR;  Service: General;  Laterality: N/A;  TAP BLOCK   INGUINAL HERNIA REPAIR Left 06/28/2023   Procedure: OPEN LEFT INGUINAL HERNIA REPAIR WITH MESH;  Surgeon: Abigail Miyamoto, MD;  Location: [facility] OR;  Service: General;  Laterality: Left;   oral surgery     PELVIC LAPAROSCOPY  1998/1999 `   pelvic adhesions and pain   TOE SURGERY Left    great toe   WISDOM TOOTH EXTRACTION     Patient Active Problem List   Diagnosis Date Noted   Adhesive capsulitis of right shoulder 11/28/2023   B12 deficiency 11/28/2023   Neuropathy 06/22/2023   Inguinal hernia of left side without obstruction or gangrene 04/30/2020   Hyperlipidemia 07/30/2019   Syncope 07/29/2019   Essential hypertension 07/29/2019   Dysmenorrhea 05/08/2018   Herpes 03/21/2014   Suppurative hidradenitis    Depression     PCP: Marcine Matar, MD   REFERRING PROVIDER: Kathryne Hitch*   REFERRING DIAG:  Diagnosis   M24.611 (ICD-10-CM) - Arthrofibrosis of right shoulder  Z98.890 (ICD-10-CM) - Status post arthroscopy of right shoulder    THERAPY DIAG:  Acute pain of right shoulder  Muscle weakness (generalized)  Localized edema  Rationale for Evaluation and Treatment: Rehabilitation  ONSET DATE: February 2024  SUBJECTIVE:  SUBJECTIVE STATEMENT: Pt arriving today reporting a fall yesterday morning where she tripped over some things on the floor and fell on her Rt shoulder. Pt stating pain today was 6/10. Pt feels the steroid injection helped. Pt stating it's not throbbing at night anymore.   PERTINENT HISTORY: At this point I recommended a manipulation under anesthesia followed by an arthroscopic intervention with lysis of adhesions and a subacromial decompression.  Pt s/p on 10/12/23 manipulation c debridement of anterior capsule,  inferior capsule and subacrominal space  PMH: anemia, anxiety, asthma, depression, diverticulosis, essential HTN, panic attacks, pre-diabetes, hernia repair, oral surgery, toe surgery, pelvic laparoscopy, cholecystectomy   PAIN:  NPRS scale: 6/10 Pain location: Rt shoulder- anterior and lateral, some posterior shoulder  Pain description: achy, sore, sharp at times, constant Aggravating factors: lifting my arm, ADL's Relieving factors: resting, pain meds as needed  PRECAUTIONS: None  WEIGHT BEARING RESTRICTIONS: No  FALLS:  Has patient fallen in last 6 months? Yes. Number of falls pt stating no injuries, pt stating she was dehydrated.   LIVING ENVIRONMENT: Lives with: lives with their family and lives alone Lives in: House/apartment Stairs: No Has following equipment at home: None  OCCUPATION: Out of work since May  PLOF: Independent  PATIENT GOALS: be able reach  overhead   OBJECTIVE:   DIAGNOSTIC FINDINGS:  10/31/2023 review of chart:  IMPRESSION: 1. Mild tendinosis of the supraspinatus tendon anteriorly. 2. Mild subacromial/subdeltoid bursitis. 3. Mild relative thickening of the inferior joint capsule as can be seen with adhesive capsulitis.  PATIENT SURVEYS:  11/21/23: FOTO update 48%  10/31/23: FOTO intake:  41%      COGNITION: 10/31/2023 Overall cognitive status: WFL     SENSATION: 10/31/2023 Tingling in Rt fingers, pt feels that may be due to B-12 deficiency  POSTURE 10/31/2023 Forward head and rounded shoulders  UPPER EXTREMITY ROM:   ROM (* indicated pain) Right 10/31/23 Left eval Right  PROM 11/07/23 Rt 11/14/23 PROM supine Rt 11/21/23 PROM supine Rt 11/29/2023 AROM Rt 12/05/23  Shoulder flexion 60 155 90* 110* 108 110 AA: 120  Shoulder extension 15 40       Shoulder abduction 65 162 80* 100*     Shoulder adduction         Shoulder internal rotation 50 75 60* 60 *     Shoulder external rotation 25 75 15-20* very guarded and pain limited  30 * 30  A:   (Blank rows = not tested)  UPPER EXTREMITY MMT:  MMT Right 10/31/2023 Left 10/31/2023  Shoulder flexion 2 5  Shoulder extension 3 5  Shoulder abduction 2 5  Shoulder adduction    Shoulder internal rotation 2 5  Shoulder external rotation 2 5  (Blank rows = not tested)    PALPATION:  10/31/2023 TTP: Rt upper trap, Rt bicep tendons. Rt deltoid, Rt supraspinatus.  TODAY'S TREATMENT:                                                                                            DATE: 12/102024:  TherEx:  Pulleys: flexion and scaption holding end range 3 sec x 3 minutes each  Sleeper stretch Rt shoulder 20 sec x 3 Supine cross arm posterior capsule stretch 20 sec x 3   Supine Rt shoulder flexion AAROM c 2# bar x15 holding 3nd range x 3 se Sidelying Rt shoulder abduction AROM x 15  Sidelying Rt shoulder ER c towel under arm x 20 AROM Wall ladder x 10 holding end range x 5 sec in flexion Rows: green TB 2 x 10 holding 3 sec  Manual:  Rt shoulder GH joint inferior glides G2-g3 in flexion/scaption STM to Rt upper trap and levator     TODAY'S TREATMENT:                                                                                            DATE: 12/01/2023:  TherEx:  Pulley flexion with outstretch arm, eccentric lowing focus with Rt arm 3 mins with 2-3 sec stretch hold. Pulley scaption  with outstretch arm, eccentric lowing focus with Rt arm 3 mins with 2-3 sec stretch hold. Sleeper stretch Rt shoulder 20 sec x 3 Supine cross arm posterior capsule stretch 20 sec x 3 Rt Supine Rt shoulder flexion AROM 1 lb x 15 Sidelying Rt shoulder abduction AROM x 15  Sidelying Rt shoulder ER c towel under arm x 15 AROM  Review of HEP adjustments due to progress into AROM.   Manual:  Rt shoulder GH joint inferior glides G2-g3 in flexion, scaption.  Mobilization c movement c ER passive and posterior glide.  Lat contract/relax into elevation.   TODAY'S TREATMENT:                                                                                            DATE: 11/29/2023:  TherEx:  Pulley flexion with outstretch arm, eccentric lowing focus with Rt arm 3 mins with 2-3 sec stretch hold. Pulley scaption  with outstretch arm, eccentric lowing focus with Rt arm 3 mins with 2-3 sec stretch hold. Sleeper stretch Rt shoulder 20 sec x 3 Supine cross arm posterior capsule stretch 20 sec x 3 Rt Supine Rt shoulder flexion AROM 1 lb x 15 Sidelying Rt shoulder abduction  AROM x 15  Sidelying Rt shoulder ER c towel under arm x 15 AROM  Review of HEP adjustments due to progress into AROM.   Manual:  Rt shoulder GH joint inferior glides G2-g3 in flexion, scaption.  Mobilization c  movement c ER passive and posterior glide.  Lat contract/relax into elevation.   TODAY'S TREATMENT:                                                                                            DATE:  11/21/23:  TherEx: UBE: level 1 3 minutes each direction, 1 minute rest break between  Rows: green TB x 15 holding 3 sec Shoulder Ext: red TB x 15 holding 3 sec Placing 1# weight on 1st clinic gym shelf x 10  Wall ladder x 5 scaption Supine AAROM 2 # bar 2 x 10  Sidelying ER 2 x 10   Manual PROM Rt shoulder, grade 2-3 AP GH mobs  Modalities:  Ice pack to Rt shoulder x 5 minutes   PATIENT EDUCATION: 11/29/2023 Education details: HEP update Person educated: Patient Education method: Programmer, multimedia, Demonstration, Verbal cues, and Handouts Education comprehension: verbalized understanding, returned demonstration, and verbal cues required  HOME EXERCISE PROGRAM: Access Code: [email or web address] URL: [email or web address] Date: 11/29/2023 Prepared by: Chyrel Masson  Exercises - Supine Shoulder Flexion Extension AAROM with Dowel  - 2-3 x daily - 7 x weekly - 2 sets - 10 reps - 2 seconds hold - Supine Shoulder External Rotation in 45 Degrees Abduction AAROM with Dowel (Mirrored)  - 2-3 x daily - 7 x weekly - 2 sets - 10 reps - 2 seconds hold - Standing Shoulder Posterior Capsule Stretch (Mirrored)  - 2-3 x daily - 7 x weekly - 1 sets - 3 reps - 15-30 hold - Sleeper Stretch (Mirrored)  - 2-3 x daily - 7 x weekly - 1 sets - 3 reps - 30 hold - Isometric Shoulder Flexion at Wall  - 1-2 x daily - 7 x weekly - 1 sets - 10 reps - 5 secibds hold - Standing Isometric Shoulder Internal Rotation at Doorway  - 1-2 x daily - 7 x weekly - 1 sets - 10 reps - 5 seconds hold - Standing Isometric Shoulder External Rotation with Doorway  - 1-2 x daily - 7 x weekly - 1 sets - 10 reps - 5 seconds hold - Standing Shoulder Row with Anchored Resistance  - 1 x daily - 7 x weekly - 1-2 sets - 10-15 reps - Shoulder  Extension with Resistance  - 1 x daily - 7 x weekly - 1-2 sets - 10-15 reps  ASSESSMENT:  CLINICAL IMPRESSION:  Pt  arriving today reporting a fall. Pt stating she fell on her Rt shoulder. Pt reporting initial  increased in Rt shoulder pain, but stating now her pain is back to where is was prior to the fall. Pt able to tolerate exerises with mild pain with end range of flexion, ER and abduction. Limitations still noted in ROM but pt is continuing to make progress each visit. Reccommended continued skilled PT interventions.    OBJECTIVE IMPAIRMENTS: decreased ROM, decreased  strength, increased edema, impaired UE functional use, postural dysfunction, and pain.   ACTIVITY LIMITATIONS: carrying, sleeping, bathing, toileting, dressing, reach over head, and hygiene/grooming  PARTICIPATION LIMITATIONS: meal prep, cleaning, laundry, driving, shopping, community activity, and occupation  PERSONAL FACTORS: 3+ comorbidities: see pertinent history  are also affecting patient's functional outcome.   REHAB POTENTIAL: Good  CLINICAL DECISION MAKING: Stable/uncomplicated  EVALUATION COMPLEXITY: Low   GOALS: Goals reviewed with patient? Yes  SHORT TERM GOALS: (target date for Short term goals are 3 weeks 11/24/23)  1.Patient will demonstrate independent use of home exercise program to maintain progress from in clinic treatments. Goal status: MET 11/14/23  LONG TERM GOALS: (target dates for all long term goals are 10 weeks  01/11/23 )   1. Patient will demonstrate/report pain at worst less than or equal to 2/10 to facilitate minimal limitation in daily activity secondary to pain symptoms. Goal status: on going 11/16/2023   2. Patient will demonstrate independent use of home exercise program to facilitate ability to maintain/progress functional gains from skilled physical therapy services. Goal status: on going 11/16/2023   3. Patient will demonstrate FOTO outcome > or = 61 % to indicate reduced  disability due to condition. Goal status: on going 11/21/2023   4.  Patient will demonstrate Rt UE MMT >/= 4/5 throughout to facilitate lifting, reaching, carrying at [facility] in daily activity.   Goal status: on going 11/16/2023   5.  Patient will demonstrate Rt GH joint AROM WFL s symptoms to facilitate usual overhead reaching, self care, dressing at PLOF.    Goal status: on going 11/16/2023   6.  Pt will improve her Rt shoulder flexion to >/= 140 degrees for improvements in ADL's.  Goal status: on going 11/21/2023  7. Pt will improve her Rt shoulder ER to >/= 60 degrees in order to improved functional actives.   Goal Status: on going 11/16/2023     PLAN:  PT FREQUENCY: 1-2x/week  PT DURATION: 10 weeks  PLANNED INTERVENTIONS: Can include 25366- PT Re-evaluation, 97110-Therapeutic exercises, 97530- Therapeutic activity, 97112- Neuromuscular re-education, 97535- Self Care, 97140- Manual therapy, [phone]- Gait training, [phone]- Orthotic Fit/training, [phone]- Canalith repositioning, [id number]- Aquatic Therapy, 97014- Electrical stimulation (unattended), [id number]- Electrical stimulation (manual), [id number]- Vasopneumatic device, [id number]- Ultrasound, [id number]- Traction (mechanical), [id number]- Ionotophoresis 4mg /ml Dexamethasone, Patient/Family education, Balance training, Stair training, Taping, Dry Needling, Joint mobilization, Joint manipulation, Spinal manipulation, Spinal mobilization, Scar mobilization, Vestibular training, Visual/preceptual remediation/compensation, DME instructions, Cryotherapy, and Moist heat.  All performed as medically necessary.  All included unless contraindicated  PLAN FOR NEXT SESSION: Rt shoulder AROM in gravity reduced positioning to avoid shrug.    Narda Amber, PT, MPT 12/05/23 12:16 PM   12/05/23  12:16 PM

## 2023-12-06 ENCOUNTER — Telehealth: Payer: Self-pay

## 2023-12-06 NOTE — Telephone Encounter (Signed)
Form completed and placed on Dr. Corey's desk to review and sign.  

## 2023-12-06 NOTE — Telephone Encounter (Signed)
FMLA / ADA form received from [employer].  Claim # [id number]   Due 12/05/23 - l did not receive form until today.

## 2023-12-07 ENCOUNTER — Encounter: Payer: Self-pay | Admitting: Rehabilitative and Restorative Service Providers"

## 2023-12-07 ENCOUNTER — Ambulatory Visit: Payer: No Typology Code available for payment source | Admitting: Rehabilitative and Restorative Service Providers"

## 2023-12-07 DIAGNOSIS — R600 Localized edema: Secondary | ICD-10-CM

## 2023-12-07 DIAGNOSIS — M25511 Pain in right shoulder: Secondary | ICD-10-CM

## 2023-12-07 DIAGNOSIS — M6281 Muscle weakness (generalized): Secondary | ICD-10-CM | POA: Diagnosis not present

## 2023-12-07 NOTE — Therapy (Signed)
OUTPATIENT PHYSICAL THERAPY TREATMENT   Patient Name: Misty Bailey MRN: [id number] DOB:[birth year], 52 y.o., female Today's Date: 12/07/2023  END OF SESSION:  PT End of Session - 12/07/23 1021     Visit Number 9    Number of Visits 20    Date for PT Re-Evaluation 01/12/24    Authorization Type [employer] $40 copay    Authorization - Number of Visits 57    Progress Note Due on Visit 10    PT Start Time 1017    PT Stop Time 1056    PT Time Calculation (min) 39 min    Activity Tolerance Patient limited by pain    Behavior During Therapy [facility] for tasks assessed/performed                     Past Medical History:  Diagnosis Date   Anemia    Anxiety    Arthritis 02/23/2014   Rib Cage   Asthma    Depression    Diverticulosis    Dysmenorrhea 05/08/2018   Essential hypertension 07/29/2019   HLD (hyperlipidemia)    Panic attacks    Pre-diabetes    Suppurative hidradenitis    Past Surgical History:  Procedure Laterality Date   CHOLECYSTECTOMY N/A 06/28/2023   Procedure: LAPAROSCOPIC CHOLECYSTECTOMY;  Surgeon: Abigail Miyamoto, MD;  Location: [facility] OR;  Service: General;  Laterality: N/A;  TAP BLOCK   INGUINAL HERNIA REPAIR Left 06/28/2023   Procedure: OPEN LEFT INGUINAL HERNIA REPAIR WITH MESH;  Surgeon: Abigail Miyamoto, MD;  Location: [facility] OR;  Service: General;  Laterality: Left;   oral surgery     PELVIC LAPAROSCOPY  1998/1999 `   pelvic adhesions and pain   TOE SURGERY Left    great toe   WISDOM TOOTH EXTRACTION     Patient Active Problem List   Diagnosis Date Noted   Adhesive capsulitis of right shoulder 11/28/2023   B12 deficiency 11/28/2023   Neuropathy 06/22/2023   Inguinal hernia of left side without obstruction or gangrene 04/30/2020   Hyperlipidemia 07/30/2019   Syncope 07/29/2019   Essential hypertension 07/29/2019   Dysmenorrhea 05/08/2018   Herpes 03/21/2014   Suppurative hidradenitis    Depression     PCP: Marcine Matar,  MD   REFERRING PROVIDER: Kathryne Hitch*   REFERRING DIAG:  Diagnosis  M24.611 (ICD-10-CM) - Arthrofibrosis of right shoulder  Z98.890 (ICD-10-CM) - Status post arthroscopy of right shoulder    THERAPY DIAG:  Acute pain of right shoulder  Muscle weakness (generalized)  Localized edema  Rationale for Evaluation and Treatment: Rehabilitation  ONSET DATE: February 2024  SUBJECTIVE:  SUBJECTIVE STATEMENT: Pt indicated having some soreness in arms with exercise.  Pt indicated new stretching helping some but was having trouble hold duration(indicated she was pushing too hard probably).   PERTINENT HISTORY: At this point I recommended a manipulation under anesthesia followed by an arthroscopic intervention with lysis of adhesions and a subacromial decompression.  Pt s/p on 10/12/23 manipulation c debridement of anterior capsule,  inferior capsule and subacrominal space  PMH: anemia, anxiety, asthma, depression, diverticulosis, essential HTN, panic attacks, pre-diabetes, hernia repair, oral surgery, toe surgery, pelvic laparoscopy, cholecystectomy   PAIN:  NPRS scale: 5/10 Pain location: Rt shoulder- anterior and lateral, some posterior shoulder  Pain description: achy, sore, sharp at times, constant Aggravating factors: lifting my arm, ADL's Relieving factors: resting, pain meds as needed  PRECAUTIONS: None  WEIGHT BEARING RESTRICTIONS: No  FALLS:  Has patient fallen in last 6 months? Yes. Number of falls pt stating no injuries, pt stating she was dehydrated.   LIVING ENVIRONMENT: Lives with: lives with their family and lives alone Lives in: House/apartment Stairs: No Has following equipment at home: None  OCCUPATION: Out of work since May  PLOF: Independent  PATIENT GOALS: be  able reach overhead   OBJECTIVE:   DIAGNOSTIC FINDINGS:  10/31/2023 review of chart:  IMPRESSION: 1. Mild tendinosis of the supraspinatus tendon anteriorly. 2. Mild subacromial/subdeltoid bursitis. 3. Mild relative thickening of the inferior joint capsule as can be seen with adhesive capsulitis.  PATIENT SURVEYS:  11/21/23: FOTO update 48%  10/31/23: FOTO intake:  41%      COGNITION: 10/31/2023 Overall cognitive status: WFL     SENSATION: 10/31/2023 Tingling in Rt fingers, pt feels that may be due to B-12 deficiency  POSTURE 10/31/2023 Forward head and rounded shoulders  UPPER EXTREMITY ROM:   ROM (* indicated pain) Right 10/31/23 Left eval Right  PROM 11/07/23 Rt 11/14/23 PROM supine Rt 11/21/23 PROM supine Rt 11/29/2023 AROM Rt 12/05/23  Shoulder flexion 60 155 90* 110* 108 110 AA: 120  Shoulder extension 15 40       Shoulder abduction 65 162 80* 100*     Shoulder adduction         Shoulder internal rotation 50 75 60* 60 *     Shoulder external rotation 25 75 15-20* very guarded and pain limited  30 * 30  A:   (Blank rows = not tested)  UPPER EXTREMITY MMT:  MMT Right 10/31/2023 Left 10/31/2023  Shoulder flexion 2 5  Shoulder extension 3 5  Shoulder abduction 2 5  Shoulder adduction    Shoulder internal rotation 2 5  Shoulder external rotation 2 5  (Blank rows = not tested)    PALPATION:  10/31/2023 TTP: Rt upper trap, Rt bicep tendons. Rt deltoid, Rt supraspinatus.  TODAY'S TREATMENT:                                                                                            DATE: 12/07/2023:  TherEx:  Pulley flexion with outstretch arm, eccentric lowing focus with Rt arm 3 mins with 2-3 sec stretch hold. Pulley scaption  with outstretch arm, eccentric lowing  focus with Rt arm 3 mins with 2-3 sec stretch hold. Sleeper stretch Rt shoulder 20 sec x 3 Supine cross arm posterior capsule stretch 30 sec x 3 Rt arm Spent time in review of stretching to avoid pulling or pushing too far for pain aggravation.  Focus on light stretching for longer durations to allow muscle relaxation.   TherActivity UE ranger flexion, scaption reaching Rt arm x 15 each way  UE ranger circles cw, ccw x 10 around shoulder height  Review of HEP adjustments due to progress into AROM.   Manual:  Rt shoulder GH joint inferior glides G2-g3 in flexion, scaption.  Mobilization c movement c ER passive and posterior glide.  Lat contract/relax into elevation.   TODAY'S TREATMENT:                                                                                            DATE: 12/05/2023:  TherEx:  Pulleys: flexion and scaption holding end range 3 sec x 3 minutes each  Sleeper stretch Rt shoulder 20 sec x 3 Supine cross arm posterior capsule stretch 20 sec x 3  Supine Rt shoulder flexion AAROM c 2# bar x15 holding 3nd range x 3 se Sidelying Rt shoulder abduction AROM x 15  Sidelying Rt shoulder ER c towel under arm x 20 AROM Wall ladder x 10 holding end range x 5 sec in flexion Rows: green TB 2 x 10 holding 3 sec  Manual:  Rt shoulder GH joint inferior glides G2-g3 in flexion/scaption STM to Rt upper trap and levator  TODAY'S TREATMENT:                                                                                            DATE: 12/01/2023:  TherEx:  Pulley flexion with outstretch arm, eccentric lowing focus with Rt arm 3 mins with 2-3 sec stretch hold. Pulley scaption  with outstretch arm, eccentric lowing focus with Rt arm 3 mins with 2-3 sec stretch hold. Sleeper stretch Rt shoulder 20 sec x 3 Supine  cross arm posterior capsule stretch 20 sec x 3 Rt Supine Rt shoulder flexion AROM 1 lb x 15 Sidelying Rt shoulder abduction AROM x 15  Sidelying Rt shoulder ER c towel under  arm x 15 AROM  Review of HEP adjustments due to progress into AROM.   Manual:  Rt shoulder GH joint inferior glides G2-g3 in flexion, scaption.  Mobilization c movement c ER passive and posterior glide.  Lat contract/relax into elevation.   PATIENT EDUCATION: 11/29/2023 Education details: HEP update Person educated: Patient Education method: Programmer, multimedia, Demonstration, Verbal cues, and Handouts Education comprehension: verbalized understanding, returned demonstration, and verbal cues required  HOME EXERCISE PROGRAM: Access Code: [email or web address] URL: [email or web address] Date: 11/29/2023 Prepared by: Chyrel Masson  Exercises - Supine Shoulder Flexion Extension AAROM with Dowel  - 2-3 x daily - 7 x weekly - 2 sets - 10 reps - 2 seconds hold - Supine Shoulder External Rotation in 45 Degrees Abduction AAROM with Dowel (Mirrored)  - 2-3 x daily - 7 x weekly - 2 sets - 10 reps - 2 seconds hold - Standing Shoulder Posterior Capsule Stretch (Mirrored)  - 2-3 x daily - 7 x weekly - 1 sets - 3 reps - 15-30 hold - Sleeper Stretch (Mirrored)  - 2-3 x daily - 7 x weekly - 1 sets - 3 reps - 30 hold - Isometric Shoulder Flexion at Wall  - 1-2 x daily - 7 x weekly - 1 sets - 10 reps - 5 secibds hold - Standing Isometric Shoulder Internal Rotation at Doorway  - 1-2 x daily - 7 x weekly - 1 sets - 10 reps - 5 seconds hold - Standing Isometric Shoulder External Rotation with Doorway  - 1-2 x daily - 7 x weekly - 1 sets - 10 reps - 5 seconds hold - Standing Shoulder Row with Anchored Resistance  - 1 x daily - 7 x weekly - 1-2 sets - 10-15 reps - Shoulder Extension with Resistance  - 1 x daily - 7 x weekly - 1-2 sets - 10-15 reps  ASSESSMENT:  CLINICAL IMPRESSION: Fatigue in active and AAROM attempts.  UE range utilized for replication of functional reaching activity with some increased pain symptoms due to fatigue. Guarding still noted in manual intervention with muscle pain produced in  twitches that prevent movement progression.    OBJECTIVE IMPAIRMENTS: decreased ROM, decreased strength, increased edema, impaired UE functional use, postural dysfunction, and pain.   ACTIVITY LIMITATIONS: carrying, sleeping, bathing, toileting, dressing, reach over head, and hygiene/grooming  PARTICIPATION LIMITATIONS: meal prep, cleaning, laundry, driving, shopping, community activity, and occupation  PERSONAL FACTORS: 3+ comorbidities: see pertinent history  are also affecting patient's functional outcome.   REHAB POTENTIAL: Good  CLINICAL DECISION MAKING: Stable/uncomplicated  EVALUATION COMPLEXITY: Low   GOALS: Goals reviewed with patient? Yes  SHORT TERM GOALS: (target date for Short term goals are 3 weeks 11/24/23)  1.Patient will demonstrate independent use of home exercise program to maintain progress from in clinic treatments. Goal status: MET 11/14/23  LONG TERM GOALS: (target dates for all long term goals are 10 weeks  01/11/23 )   1. Patient will demonstrate/report pain at worst less than or equal to 2/10 to facilitate minimal limitation in daily activity secondary to pain symptoms. Goal status: on going 11/16/2023   2. Patient will demonstrate independent use of home exercise program to facilitate ability to maintain/progress functional gains from skilled physical therapy services. Goal status: on going 11/16/2023   3. Patient will demonstrate FOTO  outcome > or = 61 % to indicate reduced disability due to condition. Goal status: on going 11/21/2023   4.  Patient will demonstrate Rt UE MMT >/= 4/5 throughout to facilitate lifting, reaching, carrying at [facility] in daily activity.   Goal status: on going 11/16/2023   5.  Patient will demonstrate Rt GH joint AROM WFL s symptoms to facilitate usual overhead reaching, self care, dressing at PLOF.    Goal status: on going 11/16/2023   6.  Pt will improve her Rt shoulder flexion to >/= 140 degrees for improvements in  ADL's.  Goal status: on going 11/21/2023  7. Pt will improve her Rt shoulder ER to >/= 60 degrees in order to improved functional actives.   Goal Status: on going 11/16/2023     PLAN:  PT FREQUENCY: 1-2x/week  PT DURATION: 10 weeks  PLANNED INTERVENTIONS: Can include 40981- PT Re-evaluation, 97110-Therapeutic exercises, 97530- Therapeutic activity, 97112- Neuromuscular re-education, 97535- Self Care, 97140- Manual therapy, [phone]- Gait training, [phone]- Orthotic Fit/training, [phone]- Canalith repositioning, [id number]- Aquatic Therapy, 97014- Electrical stimulation (unattended), [id number]- Electrical stimulation (manual), [id number]- Vasopneumatic device, [id number]- Ultrasound, [id number]- Traction (mechanical), [id number]- Ionotophoresis 4mg /ml Dexamethasone, Patient/Family education, Balance training, Stair training, Taping, Dry Needling, Joint mobilization, Joint manipulation, Spinal manipulation, Spinal mobilization, Scar mobilization, Vestibular training, Visual/preceptual remediation/compensation, DME instructions, Cryotherapy, and Moist heat.  All performed as medically necessary.  All included unless contraindicated  PLAN FOR NEXT SESSION: AAROM/AROM without shrug.    Chyrel Masson, PT, DPT, OCS, ATC 12/07/23  11:03 AM

## 2023-12-07 NOTE — Telephone Encounter (Signed)
[facility] faxed and sent to scan.

## 2023-12-07 NOTE — Telephone Encounter (Signed)
Form reviewed and signed by Dr. [name].   Form placed at the front desk for faxing/scanning.

## 2023-12-12 ENCOUNTER — Ambulatory Visit (INDEPENDENT_AMBULATORY_CARE_PROVIDER_SITE_OTHER): Payer: No Typology Code available for payment source | Admitting: Physical Therapy

## 2023-12-12 ENCOUNTER — Encounter: Payer: Self-pay | Admitting: Physical Therapy

## 2023-12-12 DIAGNOSIS — M25511 Pain in right shoulder: Secondary | ICD-10-CM

## 2023-12-12 DIAGNOSIS — M6281 Muscle weakness (generalized): Secondary | ICD-10-CM

## 2023-12-12 DIAGNOSIS — R600 Localized edema: Secondary | ICD-10-CM | POA: Diagnosis not present

## 2023-12-12 NOTE — Therapy (Addendum)
OUTPATIENT PHYSICAL THERAPY TREATMENT Progress Note:    Patient Name: Misty Bailey MRN: [id number] DOB:09-09-1971, 52 y.o., female Today's Date: 12/12/2023 Progress Note Reporting Period 10/31/23 to 12/12/2023   See note below for Objective Data and Assessment of Progress/Goals.     END OF SESSION:  PT End of Session - 12/12/23 1149     Visit Number 10    Number of Visits 20    Date for PT Re-Evaluation 01/12/24    Progress Note Due on Visit 20    PT Start Time 1103    PT Stop Time 1144    PT Time Calculation (min) 41 min    Activity Tolerance Patient limited by pain    Behavior During Therapy [facility] for tasks assessed/performed                      Past Medical History:  Diagnosis Date   Anemia    Anxiety    Arthritis 02/23/2014   Rib Cage   Asthma    Depression    Diverticulosis    Dysmenorrhea 05/08/2018   Essential hypertension 07/29/2019   HLD (hyperlipidemia)    Panic attacks    Pre-diabetes    Suppurative hidradenitis    Past Surgical History:  Procedure Laterality Date   CHOLECYSTECTOMY N/A 06/28/2023   Procedure: LAPAROSCOPIC CHOLECYSTECTOMY;  Surgeon: Abigail Miyamoto, MD;  Location: [facility] OR;  Service: General;  Laterality: N/A;  TAP BLOCK   INGUINAL HERNIA REPAIR Left 06/28/2023   Procedure: OPEN LEFT INGUINAL HERNIA REPAIR WITH MESH;  Surgeon: Abigail Miyamoto, MD;  Location: [facility] OR;  Service: General;  Laterality: Left;   oral surgery     PELVIC LAPAROSCOPY  1998/1999 `   pelvic adhesions and pain   TOE SURGERY Left    great toe   WISDOM TOOTH EXTRACTION     Patient Active Problem List   Diagnosis Date Noted   Adhesive capsulitis of right shoulder 11/28/2023   B12 deficiency 11/28/2023   Neuropathy 06/22/2023   Inguinal hernia of left side without obstruction or gangrene 04/30/2020   Hyperlipidemia 07/30/2019   Syncope 07/29/2019   Essential hypertension 07/29/2019   Dysmenorrhea 05/08/2018   Herpes 03/21/2014   Suppurative  hidradenitis    Depression     PCP: Marcine Matar, MD   REFERRING PROVIDER: Kathryne Hitch*   REFERRING DIAG:  Diagnosis  M24.611 (ICD-10-CM) - Arthrofibrosis of right shoulder  Z98.890 (ICD-10-CM) - Status post arthroscopy of right shoulder    THERAPY DIAG:  Acute pain of right shoulder  Muscle weakness (generalized)  Localized edema  Rationale for Evaluation and Treatment: Rehabilitation  ONSET DATE: February 2024  SUBJECTIVE:  SUBJECTIVE STATEMENT: Pt stating HEP is going OK.   PERTINENT HISTORY: At this point I recommended a manipulation under anesthesia followed by an arthroscopic intervention with lysis of adhesions and a subacromial decompression.  Pt s/p on 10/12/23 manipulation c debridement of anterior capsule,  inferior capsule and subacrominal space  PMH: anemia, anxiety, asthma, depression, diverticulosis, essential HTN, panic attacks, pre-diabetes, hernia repair, oral surgery, toe surgery, pelvic laparoscopy, cholecystectomy   PAIN:  NPRS scale: 5/10 Pain location: Rt shoulder- anterior and lateral, some posterior shoulder  Pain description: achy, sore, sharp at times, constant Aggravating factors: lifting my arm, ADL's Relieving factors: resting, pain meds as needed  PRECAUTIONS: None  WEIGHT BEARING RESTRICTIONS: No  FALLS:  Has patient fallen in last 6 months? Yes. Number of falls pt stating no injuries, pt stating she was dehydrated.   LIVING ENVIRONMENT: Lives with: lives with their family and lives alone Lives in: House/apartment Stairs: No Has following equipment at home: None  OCCUPATION: Out of work since May  PLOF: Independent  PATIENT GOALS: be able reach overhead   OBJECTIVE:   DIAGNOSTIC FINDINGS:  10/31/2023 review of chart:   IMPRESSION: 1. Mild tendinosis of the supraspinatus tendon anteriorly. 2. Mild subacromial/subdeltoid bursitis. 3. Mild relative thickening of the inferior joint capsule as can be seen with adhesive capsulitis.  PATIENT SURVEYS:  11/21/23: FOTO update 48%  10/31/23: FOTO intake:  41%      COGNITION: 10/31/2023 Overall cognitive status: WFL     SENSATION: 10/31/2023 Tingling in Rt fingers, pt feels that may be due to B-12 deficiency  POSTURE 10/31/2023 Forward head and rounded shoulders  UPPER EXTREMITY ROM:   ROM (* indicated pain) Right 10/31/23 Left eval Right  PROM 11/07/23 Rt 11/14/23 PROM supine Rt 11/21/23 PROM supine Rt 11/29/2023 AROM Rt 12/05/23 Rt 12/12/23  Shoulder flexion 60 155 90* 110* 108 110 AA: 120 A: 124  Shoulder extension 15 40        Shoulder abduction 65 162 80* 100*    A: 100  Shoulder adduction          Shoulder internal rotation 50 75 60* 60 *      Shoulder external rotation 25 75 15-20* very guarded and pain limited  30 * 30   A: 50  (Blank rows = not tested)  UPPER EXTREMITY MMT:  MMT Right 10/31/2023 Left 10/31/2023  Shoulder flexion 2 5  Shoulder extension 3 5  Shoulder abduction 2 5  Shoulder adduction    Shoulder internal rotation 2 5  Shoulder external rotation 2 5  (Blank rows = not tested)    PALPATION:  10/31/2023 TTP: Rt upper trap, Rt bicep tendons. Rt deltoid, Rt supraspinatus.  TODAY'S TREATMENT:                                                                                            DATE: 12/12/2023:  TherEx:  Pulleys: flexion and scaption holding end range 3 sec x 3 minutes each Rows: green TB 3 x 10 Wall ladder flexion and scaption x 5  each holding end range x 5 sec in flexion Standing flexion AAROM c 1# bar 2 x 10  using mirror for visual feedback to prevent shoulder hiking Supine cross arm posterior capsule stretch 20 sec x 3  Supine Rt shoulder flexion AAROM c 2# bar x15 holding 3nd range x 3 sec Side-lying Rt shoulder abduction AROM x 15  Side-lying Rt shoulder ER c towel under arm x 20 AROM Manual:  Rt shoulder GH joint inferior glides g2-g3 in flexion/scaption STM to Rt upper trap and levator scapulae   TODAY'S TREATMENT:                                                                                            DATE: 12/07/2023:  TherEx:  Pulley flexion with outstretch arm, eccentric lowing focus with Rt arm 3 mins with 2-3 sec stretch hold. Pulley scaption  with outstretch arm, eccentric lowing focus with Rt arm 3 mins with 2-3 sec stretch hold. Sleeper stretch Rt shoulder 20 sec x 3 Supine cross arm posterior capsule stretch 30 sec x 3 Rt arm Spent time in review of stretching to avoid pulling or pushing too far for pain aggravation.  Focus on light stretching for longer durations to allow muscle relaxation.   TherActivity UE ranger flexion, scaption reaching Rt arm x 15 each way  UE ranger circles cw, ccw x 10 around shoulder height  Review of HEP adjustments due to progress into AROM.   Manual:  Rt shoulder GH joint inferior glides G2-g3 in flexion, scaption.  Mobilization c movement c ER passive and posterior glide.  Lat contract/relax into elevation.   TODAY'S TREATMENT:                                                                                            DATE: 12/05/2023:  TherEx:  Pulleys: flexion and scaption holding end range 3 sec x 3 minutes each  Sleeper stretch Rt shoulder 20 sec x 3 Supine cross arm posterior capsule stretch 20 sec x 3  Supine  Rt shoulder flexion AAROM c 2# bar x15 holding 3nd range x 3 se Sidelying Rt shoulder abduction AROM x 15  Sidelying Rt shoulder ER c towel under arm x 20 AROM Wall ladder x 10 holding end range x 5 sec in flexion Rows: green  TB 2 x 10 holding 3 sec  Manual:  Rt shoulder GH joint inferior glides G2-g3 in flexion/scaption STM to Rt upper trap and levator  TODAY'S TREATMENT:                                                                                            DATE: 12/01/2023:  TherEx:  Pulley flexion with outstretch arm, eccentric lowing focus with Rt arm 3 mins with 2-3 sec stretch hold. Pulley scaption  with outstretch arm, eccentric lowing focus with Rt arm 3 mins with 2-3 sec stretch hold. Sleeper stretch Rt shoulder 20 sec x 3 Supine cross arm posterior capsule stretch 20 sec x 3 Rt Supine Rt shoulder flexion AROM 1 lb x 15 Sidelying Rt shoulder abduction AROM x 15  Sidelying Rt shoulder ER c towel under arm x 15 AROM  Review of HEP adjustments due to progress into AROM.   Manual:  Rt shoulder GH joint inferior glides G2-g3 in flexion, scaption.  Mobilization c movement c ER passive and posterior glide.  Lat contract/relax into elevation.   PATIENT EDUCATION: 11/29/2023 Education details: HEP update Person educated: Patient Education method: Programmer, multimedia, Demonstration, Verbal cues, and Handouts Education comprehension: verbalized understanding, returned demonstration, and verbal cues required  HOME EXERCISE PROGRAM: Access Code: [email or web address] URL: [email or web address] Date: 11/29/2023 Prepared by: Chyrel Masson  Exercises - Supine Shoulder Flexion Extension AAROM with Dowel  - 2-3 x daily - 7 x weekly - 2 sets - 10 reps - 2 seconds hold - Supine Shoulder External Rotation in 45 Degrees Abduction AAROM with Dowel (Mirrored)  - 2-3 x daily - 7 x weekly - 2 sets - 10 reps - 2 seconds hold - Standing Shoulder Posterior Capsule Stretch (Mirrored)  - 2-3 x daily - 7 x weekly - 1 sets - 3 reps - 15-30 hold - Sleeper Stretch (Mirrored)  - 2-3 x daily - 7 x weekly - 1 sets - 3 reps - 30 hold - Isometric Shoulder Flexion at Wall  - 1-2 x daily - 7 x weekly - 1 sets - 10 reps - 5 secibds  hold - Standing Isometric Shoulder Internal Rotation at Doorway  - 1-2 x daily - 7 x weekly - 1 sets - 10 reps - 5 seconds hold - Standing Isometric Shoulder External Rotation with Doorway  - 1-2 x daily - 7 x weekly - 1 sets - 10 reps - 5 seconds hold - Standing Shoulder Row with Anchored Resistance  - 1 x daily - 7 x weekly - 1-2 sets - 10-15 reps - Shoulder Extension with Resistance  - 1 x daily - 7 x weekly - 1-2 sets - 10-15 reps  ASSESSMENT:  CLINICAL IMPRESSION: Pt is 8 weeks post op today. Pt beginning to progress active assisted and active ROM. Pt still needed verbal, tactile cues to prevent  shoulder hiking during flexion and abduction of her Rt UE. Pt able to correct with visual feedback using the mirror. Recommending continued skilled PT interventions.    OBJECTIVE IMPAIRMENTS: decreased ROM, decreased strength, increased edema, impaired UE functional use, postural dysfunction, and pain.   ACTIVITY LIMITATIONS: carrying, sleeping, bathing, toileting, dressing, reach over head, and hygiene/grooming  PARTICIPATION LIMITATIONS: meal prep, cleaning, laundry, driving, shopping, community activity, and occupation  PERSONAL FACTORS: 3+ comorbidities: see pertinent history  are also affecting patient's functional outcome.   REHAB POTENTIAL: Good  CLINICAL DECISION MAKING: Stable/uncomplicated  EVALUATION COMPLEXITY: Low   GOALS: Goals reviewed with patient? Yes  SHORT TERM GOALS: (target date for Short term goals are 3 weeks 11/24/23)  1.Patient will demonstrate independent use of home exercise program to maintain progress from in clinic treatments. Goal status: MET 11/14/23  LONG TERM GOALS: (target dates for all long term goals are 10 weeks  01/11/23 )   1. Patient will demonstrate/report pain at worst less than or equal to 2/10 to facilitate minimal limitation in daily activity secondary to pain symptoms. Goal status: on going 12/12/23   2. Patient will demonstrate  independent use of home exercise program to facilitate ability to maintain/progress functional gains from skilled physical therapy services. Goal status: on going 12/12/23   3. Patient will demonstrate FOTO outcome > or = 61 % to indicate reduced disability due to condition. Goal status: on going 12/12/23   4.  Patient will demonstrate Rt UE MMT >/= 4/5 throughout to facilitate lifting, reaching, carrying at [facility] in daily activity.   Goal status: on going 12/12/23   5.  Patient will demonstrate Rt GH joint AROM WFL s symptoms to facilitate usual overhead reaching, self care, dressing at PLOF.    Goal status: on going 12/12/23   6.  Pt will improve her Rt shoulder flexion to >/= 140 degrees for improvements in ADL's.  Goal status: on going 12/12/23  7. Pt will improve her Rt shoulder ER to >/= 60 degrees in order to improved functional actives.   Goal Status: on going 12/12/23     PLAN:  PT FREQUENCY: 1-2x/week  PT DURATION: 10 weeks  PLANNED INTERVENTIONS: Can include 16109- PT Re-evaluation, 97110-Therapeutic exercises, 97530- Therapeutic activity, 97112- Neuromuscular re-education, 97535- Self Care, 97140- Manual therapy, [phone]- Gait training, [id number]- Orthotic Fit/training, [phone]- Canalith repositioning, [id number]- Aquatic Therapy, 97014- Electrical stimulation (unattended), [id number]- Electrical stimulation (manual), [id number]- Vasopneumatic device, [id number]- Ultrasound, [id number]- Traction (mechanical), [id number]- Ionotophoresis 4mg /ml Dexamethasone, Patient/Family education, Balance training, Stair training, Taping, Dry Needling, Joint mobilization, Joint manipulation, Spinal manipulation, Spinal mobilization, Scar mobilization, Vestibular training, Visual/preceptual remediation/compensation, DME instructions, Cryotherapy, and Moist heat.  All performed as medically necessary.  All included unless contraindicated  PLAN FOR NEXT SESSION: AAROM/AROM without shrug.  Pt is 8 weeks post op on  12/12/23   Narda Amber, PT, MPT 12/12/23 11:53 AM  12/12/23  11:53 AM

## 2023-12-14 ENCOUNTER — Ambulatory Visit (INDEPENDENT_AMBULATORY_CARE_PROVIDER_SITE_OTHER): Payer: No Typology Code available for payment source | Admitting: Rehabilitative and Restorative Service Providers"

## 2023-12-14 ENCOUNTER — Encounter: Payer: Self-pay | Admitting: Rehabilitative and Restorative Service Providers"

## 2023-12-14 DIAGNOSIS — M6281 Muscle weakness (generalized): Secondary | ICD-10-CM | POA: Diagnosis not present

## 2023-12-14 DIAGNOSIS — R600 Localized edema: Secondary | ICD-10-CM

## 2023-12-14 DIAGNOSIS — M25511 Pain in right shoulder: Secondary | ICD-10-CM

## 2023-12-14 NOTE — Therapy (Signed)
OUTPATIENT PHYSICAL THERAPY TREATMENT   Patient Name: Misty Bailey MRN: [id number] DOB:[birth year], 52 y.o., female Today's Date: 12/14/2023   END OF SESSION:  PT End of Session - 12/14/23 0956     Visit Number 11    Number of Visits 20    Date for PT Re-Evaluation 01/12/24    Authorization Type [employer] $40 copay    Authorization - Number of Visits 57    Progress Note Due on Visit 20    PT Start Time 1002    PT Stop Time 1043    PT Time Calculation (min) 41 min    Activity Tolerance Patient tolerated treatment well    Behavior During Therapy [facility] for tasks assessed/performed             Past Medical History:  Diagnosis Date   Anemia    Anxiety    Arthritis 02/23/2014   Rib Cage   Asthma    Depression    Diverticulosis    Dysmenorrhea 05/08/2018   Essential hypertension 07/29/2019   HLD (hyperlipidemia)    Panic attacks    Pre-diabetes    Suppurative hidradenitis    Past Surgical History:  Procedure Laterality Date   CHOLECYSTECTOMY N/A 06/28/2023   Procedure: LAPAROSCOPIC CHOLECYSTECTOMY;  Surgeon: Abigail Miyamoto, MD;  Location: [facility] OR;  Service: General;  Laterality: N/A;  TAP BLOCK   INGUINAL HERNIA REPAIR Left 06/28/2023   Procedure: OPEN LEFT INGUINAL HERNIA REPAIR WITH MESH;  Surgeon: Abigail Miyamoto, MD;  Location: [facility] OR;  Service: General;  Laterality: Left;   oral surgery     PELVIC LAPAROSCOPY  1998/1999 `   pelvic adhesions and pain   TOE SURGERY Left    great toe   WISDOM TOOTH EXTRACTION     Patient Active Problem List   Diagnosis Date Noted   Adhesive capsulitis of right shoulder 11/28/2023   B12 deficiency 11/28/2023   Neuropathy 06/22/2023   Inguinal hernia of left side without obstruction or gangrene 04/30/2020   Hyperlipidemia 07/30/2019   Syncope 07/29/2019   Essential hypertension 07/29/2019   Dysmenorrhea 05/08/2018   Herpes 03/21/2014   Suppurative hidradenitis    Depression     PCP: Marcine Matar, MD   REFERRING  PROVIDER: Kathryne Hitch*   REFERRING DIAG:  Diagnosis  M24.611 (ICD-10-CM) - Arthrofibrosis of right shoulder  Z98.890 (ICD-10-CM) - Status post arthroscopy of right shoulder    THERAPY DIAG:  Acute pain of right shoulder  Muscle weakness (generalized)  Localized edema  Rationale for Evaluation and Treatment: Rehabilitation  ONSET DATE: February 2024  SUBJECTIVE:  SUBJECTIVE STATEMENT: Pt indicated about a 4/10 upon arrival today.  Pt indicated feeling tight in movements.   PERTINENT HISTORY:  Pt s/p on 10/12/23 manipulation c debridement of anterior capsule,  inferior capsule and subacrominal space  PMH: anemia, anxiety, asthma, depression, diverticulosis, essential HTN, panic attacks, pre-diabetes, hernia repair, oral surgery, toe surgery, pelvic laparoscopy, cholecystectomy   PAIN:  NPRS scale: 4/10  Pain location: Rt shoulder- anterior and lateral, some posterior shoulder  Pain description: achy, sore, sharp at times, constant Aggravating factors: lifting my arm, ADL's Relieving factors: resting, pain meds as needed  PRECAUTIONS: None  WEIGHT BEARING RESTRICTIONS: No  FALLS:  Has patient fallen in last 6 months? Yes. Number of falls pt stating no injuries, pt stating she was dehydrated.   LIVING ENVIRONMENT: Lives with: lives with their family and lives alone Lives in: House/apartment Stairs: No Has following equipment at home: None  OCCUPATION: Out of work since May  PLOF: Independent  PATIENT GOALS: be able reach overhead   OBJECTIVE:   DIAGNOSTIC FINDINGS:  10/31/2023 review of chart:  IMPRESSION: 1. Mild tendinosis of the supraspinatus tendon anteriorly. 2. Mild subacromial/subdeltoid bursitis. 3. Mild relative thickening of the inferior joint capsule as can  be seen with adhesive capsulitis.  PATIENT SURVEYS:  11/21/23: FOTO update 48%  10/31/23: FOTO intake:  41%      COGNITION: 10/31/2023 Overall cognitive status: WFL     SENSATION: 10/31/2023 Tingling in Rt fingers, pt feels that may be due to B-12 deficiency  POSTURE 10/31/2023 Forward head and rounded shoulders  UPPER EXTREMITY ROM:   ROM (* indicated pain) Right 10/31/23 Left eval Right  PROM 11/07/23 Rt 11/14/23 PROM supine Rt 11/21/23 PROM supine Rt 11/29/2023 AROM Rt 12/05/23 Rt 12/12/23  Shoulder flexion 60 155 90* 110* 108 110 AA: 120 A: 124  Shoulder extension 15 40        Shoulder abduction 65 162 80* 100*    A: 100  Shoulder adduction          Shoulder internal rotation 50 75 60* 60 *      Shoulder external rotation 25 75 15-20* very guarded and pain limited  30 * 30   A: 50  (Blank rows = not tested)  UPPER EXTREMITY MMT:  MMT Right 10/31/2023 Left 10/31/2023  Shoulder flexion 2 5  Shoulder extension 3 5  Shoulder abduction 2 5  Shoulder adduction    Shoulder internal rotation 2 5  Shoulder external rotation 2 5  (Blank rows = not tested)    PALPATION:  10/31/2023 TTP: Rt upper trap, Rt bicep tendons. Rt deltoid, Rt supraspinatus.  TODAY'S TREATMENT:                                                                                            DATE: 12/14/2023:  TherEx:  UBE fwd/back 3 mins each way with 1 min rest between lvl 1 for ROM Pulley flexion with outstretch arm, eccentric lowing focus with Rt arm 3 mins with 2-3 sec stretch hold. Pulley scaption  with outstretch arm, eccentric lowing focus with Rt arm 3 mins with 2-3 sec stretch hold. Standing red band ER walk outs Rt shoulder c towel under arm 5 sec hold x 10  TherActivity UE ranger flexion,  scaption reaching Rt arm x 15 each way  UE ranger circles cw, ccw x 10 around shoulder height   Manual:  Rt shoulder GH joint inferior glides G2-g3 in flexion, scaption.  Mobilization c movement c ER passive and posterior glide.  Lat contract/relax into elevation.    TODAY'S TREATMENT:                                                                                            DATE: 12/12/2023:  TherEx:  Pulleys: flexion and scaption holding end range 3 sec x 3 minutes each Rows: green TB 3 x 10 Wall ladder flexion and scaption x 5  each holding end range x 5 sec in flexion Standing flexion AAROM c 1# bar 2 x 10 using mirror for visual feedback to prevent shoulder hiking Supine cross arm posterior capsule stretch 20 sec x 3  Supine Rt shoulder flexion AAROM c 2# bar x15 holding 3nd range x 3 sec Side-lying Rt shoulder abduction AROM x 15  Side-lying Rt shoulder ER c towel under arm x 20 AROM Manual:  Rt shoulder GH joint inferior glides g2-g3 in flexion/scaption STM to Rt upper trap and levator scapulae   TODAY'S TREATMENT:                                                                                            DATE: 12/07/2023:  TherEx:  Pulley flexion with outstretch arm, eccentric lowing focus with Rt arm 3 mins with 2-3 sec stretch hold. Pulley scaption  with outstretch arm, eccentric lowing focus with Rt arm 3 mins with 2-3 sec stretch hold. Sleeper stretch Rt shoulder 20 sec x 3 Supine cross arm posterior capsule stretch 30 sec x 3 Rt arm Spent time in  review of stretching to avoid pulling or pushing too far for pain aggravation.  Focus on light stretching for longer durations to allow muscle relaxation.   TherActivity UE ranger flexion, scaption reaching Rt arm x 15 each way  UE ranger circles cw, ccw x 10 around shoulder height  Review of HEP adjustments due to progress into AROM.   Manual:  Rt shoulder GH joint inferior glides G2-g3 in flexion, scaption.  Mobilization c  movement c ER passive and posterior glide.  Lat contract/relax into elevation.    PATIENT EDUCATION: 11/29/2023 Education details: HEP update Person educated: Patient Education method: Programmer, multimedia, Demonstration, Verbal cues, and Handouts Education comprehension: verbalized understanding, returned demonstration, and verbal cues required  HOME EXERCISE PROGRAM: Access Code: [email or web address] URL: [email or web address] Date: 11/29/2023 Prepared by: Chyrel Masson  Exercises - Supine Shoulder Flexion Extension AAROM with Dowel  - 2-3 x daily - 7 x weekly - 2 sets - 10 reps - 2 seconds hold - Supine Shoulder External Rotation in 45 Degrees Abduction AAROM with Dowel (Mirrored)  - 2-3 x daily - 7 x weekly - 2 sets - 10 reps - 2 seconds hold - Standing Shoulder Posterior Capsule Stretch (Mirrored)  - 2-3 x daily - 7 x weekly - 1 sets - 3 reps - 15-30 hold - Sleeper Stretch (Mirrored)  - 2-3 x daily - 7 x weekly - 1 sets - 3 reps - 30 hold - Isometric Shoulder Flexion at Wall  - 1-2 x daily - 7 x weekly - 1 sets - 10 reps - 5 secibds hold - Standing Isometric Shoulder Internal Rotation at Doorway  - 1-2 x daily - 7 x weekly - 1 sets - 10 reps - 5 seconds hold - Standing Isometric Shoulder External Rotation with Doorway  - 1-2 x daily - 7 x weekly - 1 sets - 10 reps - 5 seconds hold - Standing Shoulder Row with Anchored Resistance  - 1 x daily - 7 x weekly - 1-2 sets - 10-15 reps - Shoulder Extension with Resistance  - 1 x daily - 7 x weekly - 1-2 sets - 10-15 reps  ASSESSMENT:  CLINICAL IMPRESSION: Continued emphasis on improving overall range of motion tolerance as well as early strengthening to promote improved active range in daily activity.  Continued skilled PT services indicated at this time.    OBJECTIVE IMPAIRMENTS: decreased ROM, decreased strength, increased edema, impaired UE functional use, postural dysfunction, and pain.   ACTIVITY LIMITATIONS: carrying, sleeping,  bathing, toileting, dressing, reach over head, and hygiene/grooming  PARTICIPATION LIMITATIONS: meal prep, cleaning, laundry, driving, shopping, community activity, and occupation  PERSONAL FACTORS: 3+ comorbidities: see pertinent history  are also affecting patient's functional outcome.   REHAB POTENTIAL: Good  CLINICAL DECISION MAKING: Stable/uncomplicated  EVALUATION COMPLEXITY: Low   GOALS: Goals reviewed with patient? Yes  SHORT TERM GOALS: (target date for Short term goals are 3 weeks 11/24/23)  1.Patient will demonstrate independent use of home exercise program to maintain progress from in clinic treatments. Goal status: MET 11/14/23  LONG TERM GOALS: (target dates for all long term goals are 10 weeks  01/11/23 )   1. Patient will demonstrate/report pain at worst less than or equal to 2/10 to facilitate minimal limitation in daily activity secondary to pain symptoms. Goal status: on going 12/12/23   2. Patient will demonstrate independent use of home exercise program to facilitate ability to maintain/progress functional gains from skilled physical therapy services. Goal status: on going 12/12/23   3.  Patient will demonstrate FOTO outcome > or = 61 % to indicate reduced disability due to condition. Goal status: on going 12/12/23   4.  Patient will demonstrate Rt UE MMT >/= 4/5 throughout to facilitate lifting, reaching, carrying at [facility] in daily activity.   Goal status: on going 12/12/23   5.  Patient will demonstrate Rt GH joint AROM WFL s symptoms to facilitate usual overhead reaching, self care, dressing at PLOF.    Goal status: on going 12/12/23   6.  Pt will improve her Rt shoulder flexion to >/= 140 degrees for improvements in ADL's.  Goal status: on going 12/12/23  7. Pt will improve her Rt shoulder ER to >/= 60 degrees in order to improved functional actives.   Goal Status: on going 12/12/23     PLAN:  PT FREQUENCY: 1-2x/week  PT DURATION: 10  weeks  PLANNED INTERVENTIONS: Can include 62952- PT Re-evaluation, 97110-Therapeutic exercises, 97530- Therapeutic activity, 97112- Neuromuscular re-education, 97535- Self Care, 97140- Manual therapy, [phone]- Gait training, [phone]- Orthotic Fit/training, [phone]- Canalith repositioning, [id number]- Aquatic Therapy, 97014- Electrical stimulation (unattended), [id number]- Electrical stimulation (manual), [id number]- Vasopneumatic device, [id number]- Ultrasound, [id number]- Traction (mechanical), [id number]- Ionotophoresis 4mg /ml Dexamethasone, Patient/Family education, Balance training, Stair training, Taping, Dry Needling, Joint mobilization, Joint manipulation, Spinal manipulation, Spinal mobilization, Scar mobilization, Vestibular training, Visual/preceptual remediation/compensation, DME instructions, Cryotherapy, and Moist heat.  All performed as medically necessary.  All included unless contraindicated  PLAN FOR NEXT SESSION: MMT update Rt shoulder/ FOTO update (around 30 days since last update)   Chyrel Masson, PT, DPT, OCS, ATC 12/14/23  10:47 AM

## 2023-12-16 ENCOUNTER — Other Ambulatory Visit: Payer: Self-pay | Admitting: Internal Medicine

## 2023-12-16 DIAGNOSIS — N898 Other specified noninflammatory disorders of vagina: Secondary | ICD-10-CM

## 2023-12-17 MED ORDER — FLUCONAZOLE 150 MG PO TABS
150.0000 mg | ORAL_TABLET | Freq: Every day | ORAL | 0 refills | Status: DC
Start: 2023-12-17 — End: 2024-01-05

## 2023-12-18 ENCOUNTER — Encounter: Payer: Self-pay | Admitting: Physical Therapy

## 2023-12-18 ENCOUNTER — Ambulatory Visit (INDEPENDENT_AMBULATORY_CARE_PROVIDER_SITE_OTHER): Payer: No Typology Code available for payment source | Admitting: Physical Therapy

## 2023-12-18 DIAGNOSIS — R600 Localized edema: Secondary | ICD-10-CM | POA: Diagnosis not present

## 2023-12-18 DIAGNOSIS — M25511 Pain in right shoulder: Secondary | ICD-10-CM

## 2023-12-18 DIAGNOSIS — M6281 Muscle weakness (generalized): Secondary | ICD-10-CM

## 2023-12-18 NOTE — Therapy (Signed)
OUTPATIENT PHYSICAL THERAPY TREATMENT   Patient Name: Misty Bailey MRN: [id number] DOB:[birth year], 52 y.o., female Today's Date: 12/18/2023   END OF SESSION:  PT End of Session - 12/18/23 0941     Visit Number 12    Number of Visits 20    Date for PT Re-Evaluation 01/12/24    Authorization Type [employer] $40 copay    Progress Note Due on Visit 20    PT Start Time 0940    PT Stop Time 1015    PT Time Calculation (min) 35 min    Activity Tolerance Patient tolerated treatment well    Behavior During Therapy [facility] for tasks assessed/performed              Past Medical History:  Diagnosis Date   Anemia    Anxiety    Arthritis 02/23/2014   Rib Cage   Asthma    Depression    Diverticulosis    Dysmenorrhea 05/08/2018   Essential hypertension 07/29/2019   HLD (hyperlipidemia)    Panic attacks    Pre-diabetes    Suppurative hidradenitis    Past Surgical History:  Procedure Laterality Date   CHOLECYSTECTOMY N/A 06/28/2023   Procedure: LAPAROSCOPIC CHOLECYSTECTOMY;  Surgeon: Abigail Miyamoto, MD;  Location: [facility] OR;  Service: General;  Laterality: N/A;  TAP BLOCK   INGUINAL HERNIA REPAIR Left 06/28/2023   Procedure: OPEN LEFT INGUINAL HERNIA REPAIR WITH MESH;  Surgeon: Abigail Miyamoto, MD;  Location: [facility] OR;  Service: General;  Laterality: Left;   oral surgery     PELVIC LAPAROSCOPY  1998/1999 `   pelvic adhesions and pain   TOE SURGERY Left    great toe   WISDOM TOOTH EXTRACTION     Patient Active Problem List   Diagnosis Date Noted   Adhesive capsulitis of right shoulder 11/28/2023   B12 deficiency 11/28/2023   Neuropathy 06/22/2023   Inguinal hernia of left side without obstruction or gangrene 04/30/2020   Hyperlipidemia 07/30/2019   Syncope 07/29/2019   Essential hypertension 07/29/2019   Dysmenorrhea 05/08/2018   Herpes 03/21/2014   Suppurative hidradenitis    Depression     PCP: Marcine Matar, MD   REFERRING PROVIDER: Kathryne Hitch*   REFERRING DIAG:  Diagnosis  M24.611 (ICD-10-CM) - Arthrofibrosis of right shoulder  Z98.890 (ICD-10-CM) - Status post arthroscopy of right shoulder    THERAPY DIAG:  Acute pain of right shoulder  Muscle weakness (generalized)  Localized edema  Rationale for Evaluation and Treatment: Rehabilitation  ONSET DATE: February 2024  SUBJECTIVE:  SUBJECTIVE STATEMENT: Pt reporting 6/10 in her Rt shoulder.   PERTINENT HISTORY:  Pt s/p on 10/12/23 manipulation c debridement of anterior capsule,  inferior capsule and subacrominal space  PMH: anemia, anxiety, asthma, depression, diverticulosis, essential HTN, panic attacks, pre-diabetes, hernia repair, oral surgery, toe surgery, pelvic laparoscopy, cholecystectomy   PAIN:  NPRS scale: 6/10  Pain location: Rt shoulder- anterior and lateral, some posterior shoulder  Pain description: achy, sore, sharp at times, constant Aggravating factors: lifting my arm, ADL's Relieving factors: resting, pain meds as needed  PRECAUTIONS: None  WEIGHT BEARING RESTRICTIONS: No  FALLS:  Has patient fallen in last 6 months? Yes. Number of falls pt stating no injuries, pt stating she was dehydrated.   LIVING ENVIRONMENT: Lives with: lives with their family and lives alone Lives in: House/apartment Stairs: No Has following equipment at home: None  OCCUPATION: Out of work since May  PLOF: Independent  PATIENT GOALS: be able reach overhead   OBJECTIVE:   DIAGNOSTIC FINDINGS:  10/31/2023 review of chart:  IMPRESSION: 1. Mild tendinosis of the supraspinatus tendon anteriorly. 2. Mild subacromial/subdeltoid bursitis. 3. Mild relative thickening of the inferior joint capsule as can be seen with adhesive capsulitis.  PATIENT SURVEYS:  11/21/23: FOTO update  46%  11/21/23: FOTO update 48%  10/31/23: FOTO intake:  41%      COGNITION: 10/31/2023 Overall cognitive status: WFL     SENSATION: 10/31/2023 Tingling in Rt fingers, pt feels that may be due to B-12 deficiency  POSTURE 10/31/2023 Forward head and rounded shoulders  UPPER EXTREMITY ROM:   ROM (* indicated pain) Right 10/31/23 Left eval Right  PROM 11/07/23 Rt 11/14/23 PROM supine Rt 11/21/23 PROM supine Rt 11/29/2023 AROM Rt 12/05/23 Rt 12/12/23  Shoulder flexion 60 155 90* 110* 108 110 AA: 120 A: 124  Shoulder extension 15 40        Shoulder abduction 65 162 80* 100*    A: 100  Shoulder adduction          Shoulder internal rotation 50 75 60* 60 *      Shoulder external rotation 25 75 15-20* very guarded and pain limited  30 * 30   A: 50  (Blank rows = not tested)  UPPER EXTREMITY MMT:  MMT Right 10/31/2023 Left 10/31/2023 Rt 12/18/23  Shoulder flexion 2 5 3   Shoulder extension 3 5 3+  Shoulder abduction 2 5 3   Shoulder adduction     Shoulder internal rotation 2 5 3   Shoulder external rotation 2 5 3   (Blank rows = not tested)    PALPATION:  10/31/2023 TTP: Rt upper trap, Rt bicep tendons. Rt deltoid, Rt supraspinatus.  TODAY'S TREATMENT:                                                                                            DATE: 12/18/2023:  TherEx:  UBE fwd/back 3 mins each way with 1 min rest between lvl 1.5 for ROM Pulley flexion and scaption with outstretch arm, eccentric lowing focus with Rt arm 3 mins with 3 sec stretch hold Standing red band ER walk outs Rt shoulder c towel under arm 5 sec hold x 10 Standing UE ranger flexion, scaption reaching Rt arm x 15 each way  Standing UE ranger circles cw, ccw x 15 around shoulder height Manual:  Rt shoulder GH  joint inferior glides G2-G3 in flexion and scaption Scapula mobs  Passive ER and posterior glide      TODAY'S TREATMENT:                                                                                            DATE: 12/14/2023:  TherEx:  UBE fwd/back 3 mins each way with 1 min rest between lvl 1 for ROM Pulley flexion with outstretch arm, eccentric lowing focus with Rt arm 3 mins with 2-3 sec stretch hold. Pulley scaption  with outstretch arm, eccentric lowing focus with Rt arm 3 mins with 2-3 sec stretch hold. Standing red band ER walk outs Rt shoulder c towel under arm 5 sec hold x 10  TherActivity UE ranger flexion, scaption reaching Rt arm x 15 each way  UE ranger circles cw, ccw x 10 around shoulder height   Manual:  Rt shoulder GH joint inferior glides G2-g3 in flexion, scaption.  Mobilization c movement c ER passive and posterior glide.  Lat contract/relax into elevation.    TODAY'S TREATMENT:                                                                                            DATE: 12/12/2023:  TherEx:  Pulleys: flexion and scaption holding end range 3 sec x 3 minutes each Rows: green TB 3 x 10 Wall ladder flexion and scaption x 5  each holding end range x 5 sec in flexion Standing flexion AAROM c 1# bar 2 x 10 using mirror for visual feedback to prevent shoulder hiking Supine cross arm posterior capsule stretch 20 sec x 3  Supine Rt shoulder flexion AAROM c 2# bar x15 holding 3nd range x  3 sec Side-lying Rt shoulder abduction AROM x 15  Side-lying Rt shoulder ER c towel under arm x 20 AROM Manual:  Rt shoulder GH joint inferior glides g2-g3 in flexion/scaption STM to Rt upper trap and levator scapulae     PATIENT EDUCATION: 11/29/2023 Education details: HEP update Person educated: Patient Education method: Programmer, multimedia, Demonstration, Verbal cues, and Handouts Education comprehension: verbalized understanding, returned demonstration, and verbal cues  required  HOME EXERCISE PROGRAM: Access Code: [email or web address] URL: [email or web address] Date: 11/29/2023 Prepared by: Chyrel Masson  Exercises - Supine Shoulder Flexion Extension AAROM with Dowel  - 2-3 x daily - 7 x weekly - 2 sets - 10 reps - 2 seconds hold - Supine Shoulder External Rotation in 45 Degrees Abduction AAROM with Dowel (Mirrored)  - 2-3 x daily - 7 x weekly - 2 sets - 10 reps - 2 seconds hold - Standing Shoulder Posterior Capsule Stretch (Mirrored)  - 2-3 x daily - 7 x weekly - 1 sets - 3 reps - 15-30 hold - Sleeper Stretch (Mirrored)  - 2-3 x daily - 7 x weekly - 1 sets - 3 reps - 30 hold - Isometric Shoulder Flexion at Wall  - 1-2 x daily - 7 x weekly - 1 sets - 10 reps - 5 secibds hold - Standing Isometric Shoulder Internal Rotation at Doorway  - 1-2 x daily - 7 x weekly - 1 sets - 10 reps - 5 seconds hold - Standing Isometric Shoulder External Rotation with Doorway  - 1-2 x daily - 7 x weekly - 1 sets - 10 reps - 5 seconds hold - Standing Shoulder Row with Anchored Resistance  - 1 x daily - 7 x weekly - 1-2 sets - 10-15 reps - Shoulder Extension with Resistance  - 1 x daily - 7 x weekly - 1-2 sets - 10-15 reps  ASSESSMENT:  CLINICAL IMPRESSION: Pt arriving today reporting 6/10 pain in her Rt shoulder. Pt stating she may have over did it over the weekend. Pt tolerating exercises with slow movements and increased time to help control Rt shoulder pain. Continued emphasis on improving overall range of motion and strength as pt tolerates. FOTO updated this visit with score of 46%. Continued skilled PT services indicated at this time.    OBJECTIVE IMPAIRMENTS: decreased ROM, decreased strength, increased edema, impaired UE functional use, postural dysfunction, and pain.   ACTIVITY LIMITATIONS: carrying, sleeping, bathing, toileting, dressing, reach over head, and hygiene/grooming  PARTICIPATION LIMITATIONS: meal prep, cleaning, laundry, driving, shopping, community  activity, and occupation  PERSONAL FACTORS: 3+ comorbidities: see pertinent history  are also affecting patient's functional outcome.   REHAB POTENTIAL: Good  CLINICAL DECISION MAKING: Stable/uncomplicated  EVALUATION COMPLEXITY: Low   GOALS: Goals reviewed with patient? Yes  SHORT TERM GOALS: (target date for Short term goals are 3 weeks 11/24/23)  1.Patient will demonstrate independent use of home exercise program to maintain progress from in clinic treatments. Goal status: MET 11/14/23  LONG TERM GOALS: (target dates for all long term goals are 10 weeks  01/11/23 )   1. Patient will demonstrate/report pain at worst less than or equal to 2/10 to facilitate minimal limitation in daily activity secondary to pain symptoms. Goal status: on going 12/18/23   2. Patient will demonstrate independent use of home exercise program to facilitate ability to maintain/progress functional gains from skilled physical therapy services. Goal status: on going 12/18/23   3. Patient will demonstrate FOTO outcome > or = 61 % to indicate reduced disability  due to condition. Goal status: on going 12/18/23   4.  Patient will demonstrate Rt UE MMT >/= 4/5 throughout to facilitate lifting, reaching, carrying at [facility] in daily activity.   Goal status: on going 12/18/23   5.  Patient will demonstrate Rt GH joint AROM WFL s symptoms to facilitate usual overhead reaching, self care, dressing at PLOF.    Goal status: on going 12/18/23   6.  Pt will improve her Rt shoulder flexion to >/= 140 degrees for improvements in ADL's.  Goal status: on going 12/18/23  7. Pt will improve her Rt shoulder ER to >/= 60 degrees in order to improved functional actives.   Goal Status: on going 12/18/23     PLAN:  PT FREQUENCY: 1-2x/week  PT DURATION: 10 weeks  PLANNED INTERVENTIONS: Can include [id number]- PT Re-evaluation, 97110-Therapeutic exercises, 97530- Therapeutic activity, 97112- Neuromuscular re-education,  97535- Self Care, 97140- Manual therapy, [phone]- Gait training, [phone]- Orthotic Fit/training, [phone]- Canalith repositioning, [id number]- Aquatic Therapy, 97014- Electrical stimulation (unattended), [id number]- Electrical stimulation (manual), [id number]- Vasopneumatic device, [id number]- Ultrasound, [id number]- Traction (mechanical), [id number]- Ionotophoresis 4mg /ml Dexamethasone, Patient/Family education, Balance training, Stair training, Taping, Dry Needling, Joint mobilization, Joint manipulation, Spinal manipulation, Spinal mobilization, Scar mobilization, Vestibular training, Visual/preceptual remediation/compensation, DME instructions, Cryotherapy, and Moist heat.  All performed as medically necessary.  All included unless contraindicated  PLAN FOR NEXT SESSION: ROM and strengthening as tolerated   Narda Amber, PT, MPT 12/18/23 10:19 AM   12/18/23  10:19 AM

## 2023-12-21 ENCOUNTER — Encounter: Payer: Self-pay | Admitting: Rehabilitative and Restorative Service Providers"

## 2023-12-21 ENCOUNTER — Ambulatory Visit (INDEPENDENT_AMBULATORY_CARE_PROVIDER_SITE_OTHER): Payer: No Typology Code available for payment source | Admitting: Rehabilitative and Restorative Service Providers"

## 2023-12-21 DIAGNOSIS — M6281 Muscle weakness (generalized): Secondary | ICD-10-CM

## 2023-12-21 DIAGNOSIS — M25511 Pain in right shoulder: Secondary | ICD-10-CM

## 2023-12-21 DIAGNOSIS — R600 Localized edema: Secondary | ICD-10-CM

## 2023-12-21 NOTE — Therapy (Signed)
OUTPATIENT PHYSICAL THERAPY TREATMENT   Patient Name: Misty Bailey MRN: [id number] DOB:1971-03-07, 52 y.o., female Today's Date: 12/21/2023   END OF SESSION:  PT End of Session - 12/21/23 0854     Visit Number 13    Number of Visits 20    Date for PT Re-Evaluation 01/12/24    Authorization Type [employer] $40 copay    Authorization - Number of Visits 57    Progress Note Due on Visit 20    PT Start Time 0851    PT Stop Time 0940    PT Time Calculation (min) 49 min    Activity Tolerance Patient limited by pain   end range limited by pain   Behavior During Therapy [facility] for tasks assessed/performed               Past Medical History:  Diagnosis Date   Anemia    Anxiety    Arthritis 02/23/2014   Rib Cage   Asthma    Depression    Diverticulosis    Dysmenorrhea 05/08/2018   Essential hypertension 07/29/2019   HLD (hyperlipidemia)    Panic attacks    Pre-diabetes    Suppurative hidradenitis    Past Surgical History:  Procedure Laterality Date   CHOLECYSTECTOMY N/A 06/28/2023   Procedure: LAPAROSCOPIC CHOLECYSTECTOMY;  Surgeon: Abigail Miyamoto, MD;  Location: [facility] OR;  Service: General;  Laterality: N/A;  TAP BLOCK   INGUINAL HERNIA REPAIR Left 06/28/2023   Procedure: OPEN LEFT INGUINAL HERNIA REPAIR WITH MESH;  Surgeon: Abigail Miyamoto, MD;  Location: [facility] OR;  Service: General;  Laterality: Left;   oral surgery     PELVIC LAPAROSCOPY  1998/1999 `   pelvic adhesions and pain   TOE SURGERY Left    great toe   WISDOM TOOTH EXTRACTION     Patient Active Problem List   Diagnosis Date Noted   Adhesive capsulitis of right shoulder 11/28/2023   B12 deficiency 11/28/2023   Neuropathy 06/22/2023   Inguinal hernia of left side without obstruction or gangrene 04/30/2020   Hyperlipidemia 07/30/2019   Syncope 07/29/2019   Essential hypertension 07/29/2019   Dysmenorrhea 05/08/2018   Herpes 03/21/2014   Suppurative hidradenitis    Depression     PCP: Marcine Matar, MD   REFERRING PROVIDER: Kathryne Hitch*   REFERRING DIAG:  Diagnosis  M24.611 (ICD-10-CM) - Arthrofibrosis of right shoulder  Z98.890 (ICD-10-CM) - Status post arthroscopy of right shoulder    THERAPY DIAG:  Acute pain of right shoulder  Muscle weakness (generalized)  Localized edema  Rationale for Evaluation and Treatment: Rehabilitation  ONSET DATE: February 2024  SUBJECTIVE:  SUBJECTIVE STATEMENT: Pt indicated doing ok today.  Pt reported a little tight.  Pt indicated "definitely seeing improvement in use of arm."   PERTINENT HISTORY:  Pt s/p on 10/12/23 manipulation c debridement of anterior capsule,  inferior capsule and subacrominal space  PMH: anemia, anxiety, asthma, depression, diverticulosis, essential HTN, panic attacks, pre-diabetes, hernia repair, oral surgery, toe surgery, pelvic laparoscopy, cholecystectomy   PAIN:  NPRS scale: 6/10  Pain location: Rt shoulder- anterior and lateral, some posterior shoulder  Pain description: achy, sore, sharp at times, constant Aggravating factors: lifting my arm, ADL's Relieving factors: resting, pain meds as needed  PRECAUTIONS: None  WEIGHT BEARING RESTRICTIONS: No  FALLS:  Has patient fallen in last 6 months? Yes. Number of falls pt stating no injuries, pt stating she was dehydrated.   LIVING ENVIRONMENT: Lives with: lives with their family and lives alone Lives in: House/apartment Stairs: No Has following equipment at home: None  OCCUPATION: Out of work since May  PLOF: Independent  PATIENT GOALS: be able reach overhead   OBJECTIVE:   DIAGNOSTIC FINDINGS:  10/31/2023 review of chart:  IMPRESSION: 1. Mild tendinosis of the supraspinatus tendon anteriorly. 2. Mild subacromial/subdeltoid bursitis. 3.  Mild relative thickening of the inferior joint capsule as can be seen with adhesive capsulitis.  PATIENT SURVEYS:  12/18/2023: FOTO update 46%  11/21/23: FOTO update 48%  10/31/23: FOTO intake:  41%      COGNITION: 10/31/2023 Overall cognitive status: WFL     SENSATION: 10/31/2023 Tingling in Rt fingers, pt feels that may be due to B-12 deficiency  POSTURE 10/31/2023 Forward head and rounded shoulders  UPPER EXTREMITY ROM:   ROM (* indicated pain) Right 10/31/23 Left eval Right  PROM 11/07/23 Rt 11/14/23 PROM supine Rt 11/21/23 PROM supine Rt 11/29/2023 AROM Rt 12/05/23 Rt 12/12/23  Shoulder flexion 60 155 90* 110* 108 110 AA: 120 A: 124  Shoulder extension 15 40        Shoulder abduction 65 162 80* 100*    A: 100  Shoulder adduction          Shoulder internal rotation 50 75 60* 60 *      Shoulder external rotation 25 75 15-20* very guarded and pain limited  30 * 30   A: 50  (Blank rows = not tested)  UPPER EXTREMITY MMT:  MMT Right 10/31/2023 Left 10/31/2023 Rt 12/18/23  Shoulder flexion 2 5 3   Shoulder extension 3 5 3+  Shoulder abduction 2 5 3   Shoulder adduction     Shoulder internal rotation 2 5 3   Shoulder external rotation 2 5 3   (Blank rows = not tested)    PALPATION:  10/31/2023 TTP: Rt upper trap, Rt bicep tendons. Rt deltoid, Rt supraspinatus.  TODAY'S TREATMENT:                                                                                            DATE: 12/21/2023:  TherEx:  UBE fwd/back 3 mins each way with 1 min rest between lvl 3 fwd, lvl 2 backward Seated cross arm stretch 15 sec x 3 in rest period of UBE Doorway Rt shoulder ER stretch 15 sec x 5 Standing AAROM 1 lb bar flexion to tolerance with focus on Rt arm lowering x 10  Standing AAROM 1  lb bar abduction to tolerance with focus on Rt arm lowering x 10  Supine 90 deg flexion Rt shoulder small circles x 20 cw, ccw 2 lb weight Supine 90 deg flexion Rt shoulder protraction hold 3 sec x 10    Manual:  Rt shoulder GH joint inferior glides G2-g3 in flexion, scaption.  Mobilization c movement c ER passive and posterior glide.  Lat contract/relax into elevation.   Vaso Rt shoulder 10 mins low compression 34 degrees  TODAY'S TREATMENT:                                                                                            DATE: 12/18/2023:  TherEx:  UBE fwd/back 3 mins each way with 1 min rest between lvl 1.5 for ROM Pulley flexion and scaption with outstretch arm, eccentric lowing focus with Rt arm 3 mins with 3 sec stretch hold Standing red band ER walk outs Rt shoulder c towel under arm 5 sec hold x 10 Standing UE ranger flexion, scaption reaching Rt arm x 15 each way  Standing UE ranger circles cw, ccw x 15 around shoulder height Manual:  Rt shoulder GH joint inferior glides G2-G3 in flexion and scaption Scapula mobs  Passive ER and posterior glide   TODAY'S TREATMENT:                                                                                            DATE: 12/14/2023:  TherEx:  UBE fwd/back 3 mins each way with 1 min rest between lvl 1 for ROM Pulley flexion with outstretch arm, eccentric lowing focus with Rt arm 3 mins with 2-3 sec stretch hold. Pulley scaption  with outstretch arm, eccentric lowing focus with Rt arm 3 mins with 2-3 sec stretch hold. Standing red band ER walk outs Rt shoulder c towel under arm  5 sec hold x 10  TherActivity UE ranger flexion, scaption reaching Rt arm x 15 each way  UE ranger circles cw, ccw x 10 around shoulder height   Manual:  Rt shoulder GH joint inferior glides G2-g3 in flexion, scaption.  Mobilization c movement c ER passive and posterior glide.  Lat contract/relax into elevation.   PATIENT  EDUCATION: 11/29/2023 Education details: HEP update Person educated: Patient Education method: Programmer, multimedia, Demonstration, Verbal cues, and Handouts Education comprehension: verbalized understanding, returned demonstration, and verbal cues required  HOME EXERCISE PROGRAM: Access Code: [email or web address] URL: [email or web address] Date: 11/29/2023 Prepared by: Chyrel Masson  Exercises - Supine Shoulder Flexion Extension AAROM with Dowel  - 2-3 x daily - 7 x weekly - 2 sets - 10 reps - 2 seconds hold - Supine Shoulder External Rotation in 45 Degrees Abduction AAROM with Dowel (Mirrored)  - 2-3 x daily - 7 x weekly - 2 sets - 10 reps - 2 seconds hold - Standing Shoulder Posterior Capsule Stretch (Mirrored)  - 2-3 x daily - 7 x weekly - 1 sets - 3 reps - 15-30 hold - Sleeper Stretch (Mirrored)  - 2-3 x daily - 7 x weekly - 1 sets - 3 reps - 30 hold - Isometric Shoulder Flexion at Wall  - 1-2 x daily - 7 x weekly - 1 sets - 10 reps - 5 secibds hold - Standing Isometric Shoulder Internal Rotation at Doorway  - 1-2 x daily - 7 x weekly - 1 sets - 10 reps - 5 seconds hold - Standing Isometric Shoulder External Rotation with Doorway  - 1-2 x daily - 7 x weekly - 1 sets - 10 reps - 5 seconds hold - Standing Shoulder Row with Anchored Resistance  - 1 x daily - 7 x weekly - 1-2 sets - 10-15 reps - Shoulder Extension with Resistance  - 1 x daily - 7 x weekly - 1-2 sets - 10-15 reps  ASSESSMENT:  CLINICAL IMPRESSION: IR mobility showing improvement in quality.  Pt did still present with end range tightness/symptoms in all movement directions for Rt shoulder.  Progressed to eccentric loading strengthening in elevation movements with fair to good overall tolerance.  Continued end range improvements and strengthening to help improve functional ability.  Vaso use today to address post activity soreness/localized edema response.    OBJECTIVE IMPAIRMENTS: decreased ROM, decreased strength, increased  edema, impaired UE functional use, postural dysfunction, and pain.   ACTIVITY LIMITATIONS: carrying, sleeping, bathing, toileting, dressing, reach over head, and hygiene/grooming  PARTICIPATION LIMITATIONS: meal prep, cleaning, laundry, driving, shopping, community activity, and occupation  PERSONAL FACTORS: 3+ comorbidities: see pertinent history  are also affecting patient's functional outcome.   REHAB POTENTIAL: Good  CLINICAL DECISION MAKING: Stable/uncomplicated  EVALUATION COMPLEXITY: Low   GOALS: Goals reviewed with patient? Yes  SHORT TERM GOALS: (target date for Short term goals are 3 weeks 11/24/23)  1.Patient will demonstrate independent use of home exercise program to maintain progress from in clinic treatments. Goal status: MET 11/14/23  LONG TERM GOALS: (target dates for all long term goals are 10 weeks  01/11/23 )   1. Patient will demonstrate/report pain at worst less than or equal to 2/10 to facilitate minimal limitation in daily activity secondary to pain symptoms. Goal status: on going 12/18/23   2. Patient will demonstrate independent use of home exercise program to facilitate ability to maintain/progress functional gains from skilled physical therapy services. Goal status: on going 12/18/23   3. Patient will demonstrate  FOTO outcome > or = 61 % to indicate reduced disability due to condition. Goal status: on going 12/18/23   4.  Patient will demonstrate Rt UE MMT >/= 4/5 throughout to facilitate lifting, reaching, carrying at [facility] in daily activity.   Goal status: on going 12/18/23   5.  Patient will demonstrate Rt GH joint AROM WFL s symptoms to facilitate usual overhead reaching, self care, dressing at PLOF.    Goal status: on going 12/18/23   6.  Pt will improve her Rt shoulder flexion to >/= 140 degrees for improvements in ADL's.  Goal status: on going 12/18/23  7. Pt will improve her Rt shoulder ER to >/= 60 degrees in order to improved functional  actives.   Goal Status: on going 12/18/23     PLAN:  PT FREQUENCY: 1-2x/week  PT DURATION: 10 weeks  PLANNED INTERVENTIONS: Can include [id number]- PT Re-evaluation, 97110-Therapeutic exercises, 97530- Therapeutic activity, 97112- Neuromuscular re-education, 97535- Self Care, 97140- Manual therapy, [phone]- Gait training, [phone]- Orthotic Fit/training, [phone]- Canalith repositioning, [id number]- Aquatic Therapy, 97014- Electrical stimulation (unattended), [id number]- Electrical stimulation (manual), [id number]- Vasopneumatic device, [id number]- Ultrasound, [id number]- Traction (mechanical), [id number]- Ionotophoresis 4mg /ml Dexamethasone, Patient/Family education, Balance training, Stair training, Taping, Dry Needling, Joint mobilization, Joint manipulation, Spinal manipulation, Spinal mobilization, Scar mobilization, Vestibular training, Visual/preceptual remediation/compensation, DME instructions, Cryotherapy, and Moist heat.  All performed as medically necessary.  All included unless contraindicated  PLAN FOR NEXT SESSION: End range longer duration stretching emphasis, elevation strengthening, ER strengthening.  Vaso as desired.    Chyrel Masson, PT, DPT, OCS, ATC 12/21/23  9:32 AM

## 2023-12-26 ENCOUNTER — Ambulatory Visit (INDEPENDENT_AMBULATORY_CARE_PROVIDER_SITE_OTHER): Payer: No Typology Code available for payment source | Admitting: Physical Therapy

## 2023-12-26 ENCOUNTER — Encounter: Payer: Self-pay | Admitting: Physical Therapy

## 2023-12-26 DIAGNOSIS — M6281 Muscle weakness (generalized): Secondary | ICD-10-CM | POA: Diagnosis not present

## 2023-12-26 DIAGNOSIS — M25511 Pain in right shoulder: Secondary | ICD-10-CM

## 2023-12-26 DIAGNOSIS — R600 Localized edema: Secondary | ICD-10-CM | POA: Diagnosis not present

## 2023-12-26 NOTE — Therapy (Signed)
 OUTPATIENT PHYSICAL THERAPY TREATMENT   Patient Name: Ramyah Pankowski Bailey MRN: [id number] DOB:September 25, 1971, 52 y.o., female Today's Date: 12/26/2023   END OF SESSION:  PT End of Session - 12/26/23 1523     Visit Number 14    Number of Visits 20    Date for PT Re-Evaluation 01/12/24    Authorization Type [employer] $40 copay    Authorization - Number of Visits 57    Progress Note Due on Visit 20    PT Start Time 1520    PT Stop Time 1600    PT Time Calculation (min) 40 min    Activity Tolerance Patient limited by pain    Behavior During Therapy [facility] for tasks assessed/performed                Past Medical History:  Diagnosis Date   Anemia    Anxiety    Arthritis 02/23/2014   Rib Cage   Asthma    Depression    Diverticulosis    Dysmenorrhea 05/08/2018   Essential hypertension 07/29/2019   HLD (hyperlipidemia)    Panic attacks    Pre-diabetes    Suppurative hidradenitis    Past Surgical History:  Procedure Laterality Date   CHOLECYSTECTOMY N/A 06/28/2023   Procedure: LAPAROSCOPIC CHOLECYSTECTOMY;  Surgeon: Vernetta Berg, MD;  Location: [facility] OR;  Service: General;  Laterality: N/A;  TAP BLOCK   INGUINAL HERNIA REPAIR Left 06/28/2023   Procedure: OPEN LEFT INGUINAL HERNIA REPAIR WITH MESH;  Surgeon: Vernetta Berg, MD;  Location: [facility] OR;  Service: General;  Laterality: Left;   oral surgery     PELVIC LAPAROSCOPY  1998/1999 `   pelvic adhesions and pain   TOE SURGERY Left    great toe   WISDOM TOOTH EXTRACTION     Patient Active Problem List   Diagnosis Date Noted   Adhesive capsulitis of right shoulder 11/28/2023   B12 deficiency 11/28/2023   Neuropathy 06/22/2023   Inguinal hernia of left side without obstruction or gangrene 04/30/2020   Hyperlipidemia 07/30/2019   Syncope 07/29/2019   Essential hypertension 07/29/2019   Dysmenorrhea 05/08/2018   Herpes 03/21/2014   Suppurative hidradenitis    Depression     PCP: Vicci Barnie NOVAK, MD   REFERRING  PROVIDER: Vernetta Lonni GRADE*   REFERRING DIAG:  Diagnosis  M24.611 (ICD-10-CM) - Arthrofibrosis of right shoulder  Z98.890 (ICD-10-CM) - Status post arthroscopy of right shoulder    THERAPY DIAG:  Acute pain of right shoulder  Muscle weakness (generalized)  Localized edema  Rationale for Evaluation and Treatment: Rehabilitation  ONSET DATE: February 2024  SUBJECTIVE:  SUBJECTIVE STATEMENT: Pt reporting 5/10 pain in her Rt shoulder. Pt stating pain worsens when she sleeps on her shoulder. Pt stating reaching overhead is still painful and difficult.   PERTINENT HISTORY:  Pt s/p on 10/12/23 manipulation c debridement of anterior capsule,  inferior capsule and subacrominal space  PMH: anemia, anxiety, asthma, depression, diverticulosis, essential HTN, panic attacks, pre-diabetes, hernia repair, oral surgery, toe surgery, pelvic laparoscopy, cholecystectomy   PAIN:  NPRS scale: 5/10  Pain location: Rt shoulder- anterior and lateral, some posterior shoulder  Pain description: achy, sore, sharp at times, constant Aggravating factors: lifting my arm, ADL's Relieving factors: resting, pain meds as needed  PRECAUTIONS: None  WEIGHT BEARING RESTRICTIONS: No  FALLS:  Has patient fallen in last 6 months? Yes. Number of falls pt stating no injuries, pt stating she was dehydrated.   LIVING ENVIRONMENT: Lives with: lives with their family and lives alone Lives in: House/apartment Stairs: No Has following equipment at home: None  OCCUPATION: Out of work since May  PLOF: Independent  PATIENT GOALS: be able reach overhead   OBJECTIVE:   DIAGNOSTIC FINDINGS:  10/31/2023 review of chart:  IMPRESSION: 1. Mild tendinosis of the supraspinatus tendon anteriorly. 2. Mild subacromial/subdeltoid  bursitis. 3. Mild relative thickening of the inferior joint capsule as can be seen with adhesive capsulitis.  PATIENT SURVEYS:  12/18/2023: FOTO update 46%  11/21/23: FOTO update 48%  10/31/23: FOTO intake:  41%      COGNITION: 10/31/2023 Overall cognitive status: WFL     SENSATION: 10/31/2023 Tingling in Rt fingers, pt feels that may be due to B-12 deficiency  POSTURE 10/31/2023 Forward head and rounded shoulders  UPPER EXTREMITY ROM:   ROM (* indicated pain) Right 10/31/23 Left eval Right  PROM 11/07/23 Rt 11/14/23 PROM supine Rt 11/21/23 PROM supine Rt 11/29/2023 AROM Rt 12/05/23 Rt 12/12/23 Rt 12/26/23 supine  Shoulder flexion 60 155 90* 110* 108 110 AA: 120 A: 124 A: 128  Shoulder extension 15 40         Shoulder abduction 65 162 80* 100*    A: 100 A: 110  Shoulder adduction           Shoulder internal rotation 50 75 60* 60 *       Shoulder external rotation 25 75 15-20* very guarded and pain limited  30 * 30   A: 50 A: 55  (Blank rows = not tested)  UPPER EXTREMITY MMT:  MMT Right 10/31/2023 Left 10/31/2023 Rt 12/18/23  Shoulder flexion 2 5 3   Shoulder extension 3 5 3+  Shoulder abduction 2 5 3   Shoulder adduction     Shoulder internal rotation 2 5 3   Shoulder external rotation 2 5 3   (Blank rows = not tested)    PALPATION:  10/31/2023 TTP: Rt upper trap, Rt bicep tendons. Rt deltoid, Rt supraspinatus.  TODAY'S TREATMENT:                                                                                            DATE: 12/26/2023:  TherEx:  UBE: Level 3 x 3 minutes each direction Standing cross arm stretch x 2 holding 30 sec Door stretch bil UE's 90 deg holding 20 sec x 3  Standing AAROM c 1 # bar 2 x 10  Supine AAROM c 1# bar 2 x 10  Supine circles at 90  deg flexion both directions for scapular stabilization c 2# weight Manual:  Grade 2-3 GH mobs in flexion and inferior glides Modalities:  Vaso-pneumatic: x 10 minutes, low compression   TODAY'S TREATMENT:                                                                                            DATE: 12/21/2023:  TherEx:  UBE fwd/back 3 mins each way with 1 min rest between lvl 3 fwd, lvl 2 backward Seated cross arm stretch 15 sec x 3 in rest period of UBE Doorway Rt shoulder ER stretch 15 sec x 5 Standing AAROM 1 lb bar flexion to tolerance with focus on Rt arm lowering x 10  Standing AAROM 1 lb bar abduction to tolerance with focus on Rt arm lowering x 10  Supine 90 deg flexion Rt shoulder small circles x 20 cw, ccw 2 lb weight Supine 90 deg flexion Rt shoulder protraction hold 3 sec x 10    Manual:  Rt shoulder GH joint inferior glides G2-g3 in flexion, scaption.  Mobilization c movement c ER passive and posterior glide.  Lat contract/relax into elevation.   Vaso Rt shoulder 10 mins low compression 34 degrees  TODAY'S TREATMENT:                                                                                            DATE: 12/18/2023:  TherEx:  UBE fwd/back 3 mins each way with 1 min rest between lvl 1.5 for ROM Pulley flexion and scaption with outstretch arm, eccentric lowing focus with Rt arm 3 mins with 3 sec stretch hold Standing red band ER walk outs Rt shoulder c towel under arm 5 sec hold x 10 Standing UE ranger flexion, scaption reaching Rt arm x 15 each way  Standing UE ranger circles cw, ccw x 15 around shoulder height Manual:  Rt shoulder  GH joint inferior glides G2-G3 in flexion and scaption Scapula mobs  Passive ER and posterior glide    PATIENT EDUCATION: 11/29/2023 Education details: HEP update Person educated: Patient Education method: Programmer, Multimedia, Demonstration, Verbal cues, and Handouts Education comprehension: verbalized understanding, returned  demonstration, and verbal cues required  HOME EXERCISE PROGRAM: Access Code: [email or web address] URL: [email or web address] Date: 11/29/2023 Prepared by: Ozell Silvan  Exercises - Supine Shoulder Flexion Extension AAROM with Dowel  - 2-3 x daily - 7 x weekly - 2 sets - 10 reps - 2 seconds hold - Supine Shoulder External Rotation in 45 Degrees Abduction AAROM with Dowel (Mirrored)  - 2-3 x daily - 7 x weekly - 2 sets - 10 reps - 2 seconds hold - Standing Shoulder Posterior Capsule Stretch (Mirrored)  - 2-3 x daily - 7 x weekly - 1 sets - 3 reps - 15-30 hold - Sleeper Stretch (Mirrored)  - 2-3 x daily - 7 x weekly - 1 sets - 3 reps - 30 hold - Isometric Shoulder Flexion at Wall  - 1-2 x daily - 7 x weekly - 1 sets - 10 reps - 5 secibds hold - Standing Isometric Shoulder Internal Rotation at Doorway  - 1-2 x daily - 7 x weekly - 1 sets - 10 reps - 5 seconds hold - Standing Isometric Shoulder External Rotation with Doorway  - 1-2 x daily - 7 x weekly - 1 sets - 10 reps - 5 seconds hold - Standing Shoulder Row with Anchored Resistance  - 1 x daily - 7 x weekly - 1-2 sets - 10-15 reps - Shoulder Extension with Resistance  - 1 x daily - 7 x weekly - 1-2 sets - 10-15 reps  ASSESSMENT:  CLINICAL IMPRESSION: Pt still presenting with end range tightness and pain. Pt is making gain in her ROM and tolerating treatment well. Continue skilled PT to progress toward LTGs not met.    OBJECTIVE IMPAIRMENTS: decreased ROM, decreased strength, increased edema, impaired UE functional use, postural dysfunction, and pain.   ACTIVITY LIMITATIONS: carrying, sleeping, bathing, toileting, dressing, reach over head, and hygiene/grooming  PARTICIPATION LIMITATIONS: meal prep, cleaning, laundry, driving, shopping, community activity, and occupation  PERSONAL FACTORS: 3+ comorbidities: see pertinent history  are also affecting patient's functional outcome.   REHAB POTENTIAL: Good  CLINICAL DECISION MAKING:  Stable/uncomplicated  EVALUATION COMPLEXITY: Low   GOALS: Goals reviewed with patient? Yes  SHORT TERM GOALS: (target date for Short term goals are 3 weeks 11/24/23)  1.Patient will demonstrate independent use of home exercise program to maintain progress from in clinic treatments. Goal status: MET 11/14/23  LONG TERM GOALS: (target dates for all long term goals are 10 weeks  01/11/23 )   1. Patient will demonstrate/report pain at worst less than or equal to 2/10 to facilitate minimal limitation in daily activity secondary to pain symptoms. Goal status: on going 12/18/23   2. Patient will demonstrate independent use of home exercise program to facilitate ability to maintain/progress functional gains from skilled physical therapy services. Goal status: on going 12/18/23   3. Patient will demonstrate FOTO outcome > or = 61 % to indicate reduced disability due to condition. Goal status: on going 12/18/23   4.  Patient will demonstrate Rt UE MMT >/= 4/5 throughout to facilitate lifting, reaching, carrying at [facility] in daily activity.   Goal status: on going 12/18/23   5.  Patient will demonstrate Rt GH joint AROM WFL s symptoms to facilitate usual overhead reaching, self care, dressing  at [facility].    Goal status: on going 12/18/23   6.  Pt will improve her Rt shoulder flexion to >/= 140 degrees for improvements in ADL's.  Goal status: on going 12/18/23  7. Pt will improve her Rt shoulder ER to >/= 60 degrees in order to improved functional actives.   Goal Status: on going 12/18/23     PLAN:  PT FREQUENCY: 1-2x/week  PT DURATION: 10 weeks  PLANNED INTERVENTIONS: Can include 02853- PT Re-evaluation, 97110-Therapeutic exercises, 97530- Therapeutic activity, 97112- Neuromuscular re-education, 97535- Self Care, 97140- Manual therapy, [phone]- Gait training, [phone]- Orthotic Fit/training, [phone]- Canalith repositioning, [id number]- Aquatic Therapy, 97014- Electrical stimulation (unattended),  [id number]- Electrical stimulation (manual), [id number]- Vasopneumatic device, [id number]- Ultrasound, [id number]- Traction (mechanical), [id number]- Ionotophoresis 4mg /ml Dexamethasone , Patient/Family education, Balance training, Stair training, Taping, Dry Needling, Joint mobilization, Joint manipulation, Spinal manipulation, Spinal mobilization, Scar mobilization, Vestibular training, Visual/preceptual remediation/compensation, DME instructions, Cryotherapy, and Moist heat.  All performed as medically necessary.  All included unless contraindicated  PLAN FOR NEXT SESSION: End range longer duration stretching emphasis, elevation strengthening, ER strengthening.  Vaso as desired.    Delon Lunger, PT, MPT 12/26/23 4:09 PM   12/26/23  4:09 PM

## 2024-01-02 ENCOUNTER — Ambulatory Visit: Payer: No Typology Code available for payment source | Admitting: Physical Therapy

## 2024-01-02 ENCOUNTER — Encounter: Payer: Self-pay | Admitting: Physical Therapy

## 2024-01-02 DIAGNOSIS — M25511 Pain in right shoulder: Secondary | ICD-10-CM

## 2024-01-02 DIAGNOSIS — M6281 Muscle weakness (generalized): Secondary | ICD-10-CM

## 2024-01-02 DIAGNOSIS — R600 Localized edema: Secondary | ICD-10-CM

## 2024-01-02 NOTE — Therapy (Signed)
 OUTPATIENT PHYSICAL THERAPY TREATMENT   Patient Name: Misty Bailey MRN: [id number] DOB:09-17-1971, 53 y.o., female Today's Date: 01/02/2024   END OF SESSION:  PT End of Session - 01/02/24 0935     Visit Number 15    Number of Visits 20    Date for PT Re-Evaluation 01/12/24    Progress Note Due on Visit 20    PT Start Time 0935    PT Stop Time 1013    PT Time Calculation (min) 38 min    Activity Tolerance Patient limited by pain    Behavior During Therapy [facility] for tasks assessed/performed                Past Medical History:  Diagnosis Date   Anemia    Anxiety    Arthritis 02/23/2014   Rib Cage   Asthma    Depression    Diverticulosis    Dysmenorrhea 05/08/2018   Essential hypertension 07/29/2019   HLD (hyperlipidemia)    Panic attacks    Pre-diabetes    Suppurative hidradenitis    Past Surgical History:  Procedure Laterality Date   CHOLECYSTECTOMY N/A 06/28/2023   Procedure: LAPAROSCOPIC CHOLECYSTECTOMY;  Surgeon: Vernetta Berg, MD;  Location: [facility] OR;  Service: General;  Laterality: N/A;  TAP BLOCK   INGUINAL HERNIA REPAIR Left 06/28/2023   Procedure: OPEN LEFT INGUINAL HERNIA REPAIR WITH MESH;  Surgeon: Vernetta Berg, MD;  Location: [facility] OR;  Service: General;  Laterality: Left;   oral surgery     PELVIC LAPAROSCOPY  1998/1999 `   pelvic adhesions and pain   TOE SURGERY Left    great toe   WISDOM TOOTH EXTRACTION     Patient Active Problem List   Diagnosis Date Noted   Adhesive capsulitis of right shoulder 11/28/2023   B12 deficiency 11/28/2023   Neuropathy 06/22/2023   Inguinal hernia of left side without obstruction or gangrene 04/30/2020   Hyperlipidemia 07/30/2019   Syncope 07/29/2019   Essential hypertension 07/29/2019   Dysmenorrhea 05/08/2018   Herpes 03/21/2014   Suppurative hidradenitis    Depression     PCP: Vicci Barnie NOVAK, MD   REFERRING PROVIDER: Vernetta Lonni GRADE*   REFERRING DIAG:  Diagnosis  M24.611  (ICD-10-CM) - Arthrofibrosis of right shoulder  Z98.890 (ICD-10-CM) - Status post arthroscopy of right shoulder    THERAPY DIAG:  Acute pain of right shoulder  Muscle weakness (generalized)  Localized edema  Rationale for Evaluation and Treatment: Rehabilitation  ONSET DATE: February 2024  SUBJECTIVE:  SUBJECTIVE STATEMENT: Pt arriving today with 7/10 pain in her Rt shoulder. Pt stating he feels she may have pulled a muscle while painting this weekend her bedrooms.   PERTINENT HISTORY:  Pt s/p on 10/12/23 manipulation c debridement of anterior capsule,  inferior capsule and subacrominal space  PMH: anemia, anxiety, asthma, depression, diverticulosis, essential HTN, panic attacks, pre-diabetes, hernia repair, oral surgery, toe surgery, pelvic laparoscopy, cholecystectomy   PAIN:  NPRS scale: 7/10  Pain location: Rt shoulder- anterior and lateral, some posterior shoulder  Pain description: achy, sore, sharp at times, constant Aggravating factors: lifting my arm, ADL's Relieving factors: resting, pain meds as needed  PRECAUTIONS: None  WEIGHT BEARING RESTRICTIONS: No  FALLS:  Has patient fallen in last 6 months? Yes. Number of falls pt stating no injuries, pt stating she was dehydrated.   LIVING ENVIRONMENT: Lives with: lives with their family and lives alone Lives in: House/apartment Stairs: No Has following equipment at home: None  OCCUPATION: Out of work since May  PLOF: Independent  PATIENT GOALS: be able reach overhead   OBJECTIVE:   DIAGNOSTIC FINDINGS:  10/31/2023 review of chart:  IMPRESSION: 1. Mild tendinosis of the supraspinatus tendon anteriorly. 2. Mild subacromial/subdeltoid bursitis. 3. Mild relative thickening of the inferior joint capsule as can be seen with  adhesive capsulitis.  PATIENT SURVEYS:  12/18/2023: FOTO update 46%  11/21/23: FOTO update 48%  10/31/23: FOTO intake:  41%      COGNITION: 10/31/2023 Overall cognitive status: WFL     SENSATION: 10/31/2023 Tingling in Rt fingers, pt feels that may be due to B-12 deficiency  POSTURE 10/31/2023 Forward head and rounded shoulders  UPPER EXTREMITY ROM:   ROM (* indicated pain) Right 10/31/23 Left eval Right  PROM 11/07/23 Rt 11/14/23 PROM supine Rt 11/21/23 PROM supine Rt 11/29/2023 AROM Rt 12/05/23 Rt 12/12/23 Rt 12/26/23 supine Rt 01/02/24 supine  Shoulder flexion 60 155 90* 110* 108 110 AA: 120 A: 124 A: 128 A: 125  Shoulder extension 15 40          Shoulder abduction 65 162 80* 100*    A: 100 A: 110 A: 112  Shoulder adduction            Shoulder internal rotation 50 75 60* 60 *        Shoulder external rotation 25 75 15-20* very guarded and pain limited  30 * 30   A: 50 A: 55 A: 54  (Blank rows = not tested)  UPPER EXTREMITY MMT:  MMT Right 10/31/2023 Left 10/31/2023 Rt 12/18/23  Shoulder flexion 2 5 3   Shoulder extension 3 5 3+  Shoulder abduction 2 5 3   Shoulder adduction     Shoulder internal rotation 2 5 3   Shoulder external rotation 2 5 3   (Blank rows = not tested)    PALPATION:  10/31/2023 TTP: Rt upper trap, Rt bicep tendons. Rt deltoid, Rt supraspinatus.  TODAY'S TREATMENT:                                                                                            DATE: 1/72025:  TherEx:  UBE: Level 3 x 3 minutes each direction Pulleys: flexion and abduction holding end range 3 seconds  Standing cross arm stretch x 2 holding 30 sec Door stretch bil UE's 60 deg holding 10 sec x 5  Standing AAROM flexion c 1 # bar 2 x 10  Standing AAROM abd 2 x 10 c 1 #  bar Manual:  Grade 2-3 GH mobs in flexion and inferior glides Modalities:  Vaso-pneumatic: x 10 minutes, low compression    TODAY'S TREATMENT:                                                                                            DATE: 12/26/2023:  TherEx:  UBE: Level 3 x 3 minutes each direction Standing cross arm stretch x 2 holding 30 sec Door stretch bil UE's 90 deg holding 20 sec x 3  Standing AAROM c 1 # bar 2 x 10  Supine AAROM c 1# bar 2 x 10  Supine circles at 90 deg flexion both directions for scapular stabilization c 2# weight Manual:  Grade 2-3 GH mobs in flexion and inferior glides Modalities:  Vaso-pneumatic: x 10 minutes, low compression   TODAY'S TREATMENT:                                                                                            DATE: 12/21/2023:  TherEx:  UBE fwd/back 3 mins each way with 1 min rest between lvl 3 fwd, lvl 2 backward Seated cross arm stretch 15 sec x 3 in rest period of UBE Doorway Rt shoulder ER stretch 15 sec x 5 Standing AAROM 1 lb bar flexion to tolerance with focus on Rt arm lowering x 10  Standing AAROM 1 lb bar abduction to tolerance with focus on Rt arm lowering x 10  Supine 90 deg flexion Rt shoulder small circles x 20 cw, ccw 2 lb weight Supine 90 deg flexion Rt shoulder protraction hold 3 sec x 10    Manual:  Rt shoulder GH joint inferior glides G2-g3 in flexion, scaption.  Mobilization c movement c ER passive and posterior glide.  Lat contract/relax into elevation.   Vaso Rt shoulder 10 mins low compression 34  degrees  TODAY'S TREATMENT:                                                                                            DATE: 12/18/2023:  TherEx:  UBE fwd/back 3 mins each way with 1 min rest between lvl 1.5 for ROM Pulley flexion and scaption with outstretch arm, eccentric lowing focus with Rt arm 3 mins with 3 sec stretch hold Standing red band ER walk outs Rt shoulder c towel under arm 5 sec hold x  10 Standing UE ranger flexion, scaption reaching Rt arm x 15 each way  Standing UE ranger circles cw, ccw x 15 around shoulder height Manual:  Rt shoulder GH joint inferior glides G2-G3 in flexion and scaption Scapula mobs  Passive ER and posterior glide    PATIENT EDUCATION: 11/29/2023 Education details: HEP update Person educated: Patient Education method: Programmer, Multimedia, Demonstration, Verbal cues, and Handouts Education comprehension: verbalized understanding, returned demonstration, and verbal cues required  HOME EXERCISE PROGRAM: Access Code: [email or web address] URL: [email or web address] Date: 11/29/2023 Prepared by: Ozell Silvan  Exercises - Supine Shoulder Flexion Extension AAROM with Dowel  - 2-3 x daily - 7 x weekly - 2 sets - 10 reps - 2 seconds hold - Supine Shoulder External Rotation in 45 Degrees Abduction AAROM with Dowel (Mirrored)  - 2-3 x daily - 7 x weekly - 2 sets - 10 reps - 2 seconds hold - Standing Shoulder Posterior Capsule Stretch (Mirrored)  - 2-3 x daily - 7 x weekly - 1 sets - 3 reps - 15-30 hold - Sleeper Stretch (Mirrored)  - 2-3 x daily - 7 x weekly - 1 sets - 3 reps - 30 hold - Isometric Shoulder Flexion at Wall  - 1-2 x daily - 7 x weekly - 1 sets - 10 reps - 5 secibds hold - Standing Isometric Shoulder Internal Rotation at Doorway  - 1-2 x daily - 7 x weekly - 1 sets - 10 reps - 5 seconds hold - Standing Isometric Shoulder External Rotation with Doorway  - 1-2 x daily - 7 x weekly - 1 sets - 10 reps - 5 seconds hold - Standing Shoulder Row with Anchored Resistance  - 1 x daily - 7 x weekly - 1-2 sets - 10-15 reps - Shoulder Extension with Resistance  - 1 x daily - 7 x weekly - 1-2 sets - 10-15 reps  ASSESSMENT:  CLINICAL IMPRESSION: Pt arriving today with 7/10 pain in her Rt shoulder. Pt reporting  increased pain following painting her bedrooms last week. Pt able to tolerate her exercises with increased pain at end range. Recommend continue  skilled PT.    OBJECTIVE IMPAIRMENTS: decreased ROM, decreased strength, increased edema, impaired UE functional use, postural dysfunction, and pain.   ACTIVITY LIMITATIONS: carrying, sleeping, bathing, toileting, dressing, reach over head, and hygiene/grooming  PARTICIPATION LIMITATIONS: meal prep, cleaning, laundry, driving, shopping, community activity, and occupation  PERSONAL FACTORS: 3+ comorbidities: see pertinent history  are also affecting patient's functional outcome.   REHAB POTENTIAL: Good  CLINICAL DECISION MAKING: Stable/uncomplicated  EVALUATION COMPLEXITY: Low   GOALS: Goals reviewed with patient? Yes  SHORT TERM GOALS: (target date for Short term goals are 3 weeks 11/24/23)  1.Patient will demonstrate independent use of home exercise program to maintain progress from in clinic treatments. Goal status: MET 11/14/23  LONG TERM GOALS: (target dates for all long term goals are 10 weeks  01/11/23 )   1. Patient will demonstrate/report pain at worst less than or equal to 2/10 to facilitate minimal limitation in daily activity secondary to pain symptoms. Goal status: on going 01/01/23   2. Patient will demonstrate independent use of home exercise program to facilitate ability to maintain/progress functional gains from skilled physical therapy services. Goal status: on going 01/01/23   3. Patient will demonstrate FOTO outcome > or = 61 % to indicate reduced disability due to condition. Goal status: on going 01/01/23   4.  Patient will demonstrate Rt UE MMT >/= 4/5 throughout to facilitate lifting, reaching, carrying at [facility] in daily activity.   Goal status: on going 01/01/23   5.  Patient will demonstrate Rt GH joint AROM WFL s symptoms to facilitate usual overhead reaching, self care, dressing at PLOF.    Goal status: on going 01/01/23   6.  Pt will improve her Rt shoulder flexion to >/= 140 degrees for improvements in ADL's.  Goal status: on going 12/18/23  7. Pt will  improve her Rt shoulder ER to >/= 60 degrees in order to improved functional actives.   Goal Status: on going 01/01/23     PLAN:  PT FREQUENCY: 1-2x/week  PT DURATION: 10 weeks  PLANNED INTERVENTIONS: Can include 02853- PT Re-evaluation, 97110-Therapeutic exercises, 97530- Therapeutic activity, 97112- Neuromuscular re-education, 97535- Self Care, 97140- Manual therapy, [phone]- Gait training, [phone]- Orthotic Fit/training, [phone]- Canalith repositioning, [id number]- Aquatic Therapy, 97014- Electrical stimulation (unattended), [id number]- Electrical stimulation (manual), [id number]- Vasopneumatic device, [id number]- Ultrasound, [id number]- Traction (mechanical), [id number]- Ionotophoresis 4mg /ml Dexamethasone , Patient/Family education, Balance training, Stair training, Taping, Dry Needling, Joint mobilization, Joint manipulation, Spinal manipulation, Spinal mobilization, Scar mobilization, Vestibular training, Visual/preceptual remediation/compensation, DME instructions, Cryotherapy, and Moist heat.  All performed as medically necessary.  All included unless contraindicated  PLAN FOR NEXT SESSION: End range longer duration stretching emphasis, elevation strengthening, ER strengthening.  Vaso as desired.    Delon Lunger, PT, MPT 01/02/24 10:08 AM   01/02/24  10:08 AM

## 2024-01-04 ENCOUNTER — Other Ambulatory Visit: Payer: Self-pay | Admitting: Internal Medicine

## 2024-01-04 ENCOUNTER — Encounter: Payer: Self-pay | Admitting: Rehabilitative and Restorative Service Providers"

## 2024-01-04 ENCOUNTER — Ambulatory Visit (INDEPENDENT_AMBULATORY_CARE_PROVIDER_SITE_OTHER): Payer: No Typology Code available for payment source | Admitting: Rehabilitative and Restorative Service Providers"

## 2024-01-04 DIAGNOSIS — R600 Localized edema: Secondary | ICD-10-CM | POA: Diagnosis not present

## 2024-01-04 DIAGNOSIS — E782 Mixed hyperlipidemia: Secondary | ICD-10-CM

## 2024-01-04 DIAGNOSIS — M6281 Muscle weakness (generalized): Secondary | ICD-10-CM

## 2024-01-04 DIAGNOSIS — M25511 Pain in right shoulder: Secondary | ICD-10-CM

## 2024-01-04 NOTE — Therapy (Signed)
 OUTPATIENT PHYSICAL THERAPY TREATMENT   Patient Name: Misty Bailey MRN: [id number] DOB:August 22, 1971, 53 y.o., female Today's Date: 01/04/2024   END OF SESSION:  PT End of Session - 01/04/24 1136     Visit Number 16    Number of Visits 20    Date for PT Re-Evaluation 01/12/24    Authorization Type [employer] $40 copay    Authorization - Number of Visits 57    Progress Note Due on Visit 20    PT Start Time 1136    PT Stop Time 1225    PT Time Calculation (min) 49 min    Activity Tolerance Patient limited by pain    Behavior During Therapy [facility] for tasks assessed/performed                 Past Medical History:  Diagnosis Date   Anemia    Anxiety    Arthritis 02/23/2014   Rib Cage   Asthma    Depression    Diverticulosis    Dysmenorrhea 05/08/2018   Essential hypertension 07/29/2019   HLD (hyperlipidemia)    Panic attacks    Pre-diabetes    Suppurative hidradenitis    Past Surgical History:  Procedure Laterality Date   CHOLECYSTECTOMY N/A 06/28/2023   Procedure: LAPAROSCOPIC CHOLECYSTECTOMY;  Surgeon: Vernetta Berg, MD;  Location: [facility] OR;  Service: General;  Laterality: N/A;  TAP BLOCK   INGUINAL HERNIA REPAIR Left 06/28/2023   Procedure: OPEN LEFT INGUINAL HERNIA REPAIR WITH MESH;  Surgeon: Vernetta Berg, MD;  Location: [facility] OR;  Service: General;  Laterality: Left;   oral surgery     PELVIC LAPAROSCOPY  1998/1999 `   pelvic adhesions and pain   TOE SURGERY Left    great toe   WISDOM TOOTH EXTRACTION     Patient Active Problem List   Diagnosis Date Noted   Adhesive capsulitis of right shoulder 11/28/2023   B12 deficiency 11/28/2023   Neuropathy 06/22/2023   Inguinal hernia of left side without obstruction or gangrene 04/30/2020   Hyperlipidemia 07/30/2019   Syncope 07/29/2019   Essential hypertension 07/29/2019   Dysmenorrhea 05/08/2018   Herpes 03/21/2014   Suppurative hidradenitis    Depression     PCP: Vicci Barnie NOVAK, MD   REFERRING  PROVIDER: Vernetta Lonni GRADE*   REFERRING DIAG:  Diagnosis  M24.611 (ICD-10-CM) - Arthrofibrosis of right shoulder  Z98.890 (ICD-10-CM) - Status post arthroscopy of right shoulder    THERAPY DIAG:  Acute pain of right shoulder  Muscle weakness (generalized)  Localized edema  Rationale for Evaluation and Treatment: Rehabilitation  ONSET DATE: February 2024  SUBJECTIVE:  SUBJECTIVE STATEMENT: Pt indicated at worst 5/10 complaints in last day or so.  Reported feeling better than weekend.    PERTINENT HISTORY:  Pt s/p on 10/12/23 manipulation c debridement of anterior capsule,  inferior capsule and subacrominal space  PMH: anemia, anxiety, asthma, depression, diverticulosis, essential HTN, panic attacks, pre-diabetes, hernia repair, oral surgery, toe surgery, pelvic laparoscopy, cholecystectomy   PAIN:  NPRS scale: 5/10 Pain location: Rt shoulder- anterior and lateral, some posterior shoulder  Pain description: achy, sore, sharp at times, constant Aggravating factors: lifting my arm, ADL's Relieving factors: resting, pain meds as needed  PRECAUTIONS: None  WEIGHT BEARING RESTRICTIONS: No  FALLS:  Has patient fallen in last 6 months? Yes. Number of falls pt stating no injuries, pt stating she was dehydrated.   LIVING ENVIRONMENT: Lives with: lives with their family and lives alone Lives in: House/apartment Stairs: No Has following equipment at home: None  OCCUPATION: Out of work since May  PLOF: Independent  PATIENT GOALS: be able reach overhead   OBJECTIVE:   DIAGNOSTIC FINDINGS:  10/31/2023 review of chart:  IMPRESSION: 1. Mild tendinosis of the supraspinatus tendon anteriorly. 2. Mild subacromial/subdeltoid bursitis. 3. Mild relative thickening of the inferior joint  capsule as can be seen with adhesive capsulitis.  PATIENT SURVEYS:  12/18/2023: FOTO update 46%  11/21/23: FOTO update 48%  10/31/23: FOTO intake:  41%      COGNITION: 10/31/2023 Overall cognitive status: WFL     SENSATION: 10/31/2023 Tingling in Rt fingers, pt feels that may be due to B-12 deficiency  POSTURE 10/31/2023 Forward head and rounded shoulders  UPPER EXTREMITY ROM:   ROM (* indicated pain) Right 10/31/23 Left eval Right  PROM 11/07/23 Rt 11/14/23 PROM supine Rt 11/21/23 PROM supine Rt 11/29/2023 AROM Rt 12/05/23 Rt 12/12/23 Rt 12/26/23 supine Rt 01/02/24 supine  Shoulder flexion 60 155 90* 110* 108 110 AA: 120 A: 124 A: 128 A: 125  Shoulder extension 15 40          Shoulder abduction 65 162 80* 100*    A: 100 A: 110 A: 112  Shoulder adduction            Shoulder internal rotation 50 75 60* 60 *        Shoulder external rotation 25 75 15-20* very guarded and pain limited  30 * 30   A: 50 A: 55 A: 54  (Blank rows = not tested)  UPPER EXTREMITY MMT:  MMT Right 10/31/2023 Left 10/31/2023 Rt 12/18/23  Shoulder flexion 2 5 3   Shoulder extension 3 5 3+  Shoulder abduction 2 5 3   Shoulder adduction     Shoulder internal rotation 2 5 3   Shoulder external rotation 2 5 3   (Blank rows = not tested)   PALPATION:  10/31/2023 TTP: Rt upper trap, Rt bicep tendons. Rt deltoid, Rt supraspinatus.  TODAY'S TREATMENT:                                                                                            DATE: 01/04/2024:  TherEx:  UBE fwd/back 3 mins each way with 1 min rest between lvl  2.5 Seated cross arm stretch 15 sec x 3  Supine Rt shoulder flexion 1 lb x 10, 0 lbs x 20 Sidelying Rt shoulder ER c towel under arm 1 lb x 10, 0 lbs  Sidelying Rt shoulder abduction  AROM x 10   Manual:  Rt shoulder GH joint inferior glides G2-g3 in flexion, scaption.  Mobilization c movement c ER passive and posterior glide.    Vaso Rt shoulder 10 mins low compression 34 degrees  TODAY'S TREATMENT:                                                                                            DATE: 1/72025:  TherEx:  UBE: Level 3 x 3 minutes each direction Pulleys: flexion and abduction holding end range 3 seconds  Standing cross arm stretch x 2 holding 30 sec Door stretch bil UE's 60 deg holding 10 sec x 5  Standing AAROM flexion c 1 # bar 2 x 10  Standing AAROM abd 2 x 10 c 1 # bar Manual:  Grade 2-3 GH mobs in flexion and inferior glides Modalities:  Vaso-pneumatic: x 10 minutes, low compression   TODAY'S TREATMENT:                                                                                            DATE: 12/26/2023:  TherEx:  UBE: Level 3 x 3 minutes each direction Standing cross arm stretch x 2 holding 30 sec Door stretch bil UE's 90 deg holding 20 sec x 3  Standing AAROM c 1 # bar 2 x 10  Supine AAROM c 1# bar 2 x 10  Supine circles at 90 deg flexion both directions for scapular stabilization c 2# weight Manual:  Grade 2-3 GH mobs in flexion and inferior glides Modalities:  Vaso-pneumatic: x 10 minutes, low compression   PATIENT EDUCATION: 11/29/2023 Education details: HEP update Person educated: Patient Education method: Programmer, Multimedia, Demonstration, Verbal cues, and Handouts Education comprehension: verbalized understanding, returned demonstration, and verbal cues required  HOME EXERCISE PROGRAM: Access Code: [email or web address] URL: [email or web address] Date: 11/29/2023 Prepared by: Ozell Silvan  Exercises - Supine  Shoulder Flexion Extension AAROM with Dowel  - 2-3 x daily - 7 x weekly - 2 sets - 10 reps - 2 seconds hold - Supine Shoulder External Rotation in 45 Degrees Abduction AAROM with Dowel (Mirrored)  - 2-3 x daily - 7 x  weekly - 2 sets - 10 reps - 2 seconds hold - Standing Shoulder Posterior Capsule Stretch (Mirrored)  - 2-3 x daily - 7 x weekly - 1 sets - 3 reps - 15-30 hold - Sleeper Stretch (Mirrored)  - 2-3 x daily - 7 x weekly - 1 sets - 3 reps - 30 hold - Isometric Shoulder Flexion at Wall  - 1-2 x daily - 7 x weekly - 1 sets - 10 reps - 5 secibds hold - Standing Isometric Shoulder Internal Rotation at Doorway  - 1-2 x daily - 7 x weekly - 1 sets - 10 reps - 5 seconds hold - Standing Isometric Shoulder External Rotation with Doorway  - 1-2 x daily - 7 x weekly - 1 sets - 10 reps - 5 seconds hold - Standing Shoulder Row with Anchored Resistance  - 1 x daily - 7 x weekly - 1-2 sets - 10-15 reps - Shoulder Extension with Resistance  - 1 x daily - 7 x weekly - 1-2 sets - 10-15 reps  ASSESSMENT:  CLINICAL IMPRESSION: Quality of passive range and quantity of passive Rt shoulder range improved.  No joint tightness was observed in available passive range today.  Range was limited by pain complaints and some muscle guarding.  Progressed to include gravity reduced active range strengthening to improve contractile tissue activation/strength. Fair tolerance to newer intervention today.    OBJECTIVE IMPAIRMENTS: decreased ROM, decreased strength, increased edema, impaired UE functional use, postural dysfunction, and pain.   ACTIVITY LIMITATIONS: carrying, sleeping, bathing, toileting, dressing, reach over head, and hygiene/grooming  PARTICIPATION LIMITATIONS: meal prep, cleaning, laundry, driving, shopping, community activity, and occupation  PERSONAL FACTORS: 3+ comorbidities: see pertinent history  are also affecting patient's functional outcome.   REHAB POTENTIAL: Good  CLINICAL DECISION MAKING: Stable/uncomplicated  EVALUATION COMPLEXITY: Low   GOALS: Goals reviewed with patient? Yes  SHORT TERM GOALS: (target date for Short term goals are 3 weeks 11/24/23)  1.Patient will demonstrate independent use  of home exercise program to maintain progress from in clinic treatments. Goal status: MET 11/14/23  LONG TERM GOALS: (target dates for all long term goals are 10 weeks  01/11/23 )   1. Patient will demonstrate/report pain at worst less than or equal to 2/10 to facilitate minimal limitation in daily activity secondary to pain symptoms. Goal status: on going 01/01/23   2. Patient will demonstrate independent use of home exercise program to facilitate ability to maintain/progress functional gains from skilled physical therapy services. Goal status: on going 01/01/23   3. Patient will demonstrate FOTO outcome > or = 61 % to indicate reduced disability due to condition. Goal status: on going 01/01/23   4.  Patient will demonstrate Rt UE MMT >/= 4/5 throughout to facilitate lifting, reaching, carrying at [facility] in daily activity.   Goal status: on going 01/01/23   5.  Patient will demonstrate Rt GH joint AROM WFL s symptoms to facilitate usual overhead reaching, self care, dressing at PLOF.    Goal status: on going 01/01/23   6.  Pt will improve her Rt shoulder flexion to >/= 140 degrees for improvements in ADL's.  Goal status: on going 12/18/23  7. Pt will improve her Rt  shoulder ER to >/= 60 degrees in order to improved functional actives.   Goal Status: on going 01/01/23     PLAN:  PT FREQUENCY: 1-2x/week  PT DURATION: 10 weeks  PLANNED INTERVENTIONS: Can include 02853- PT Re-evaluation, 97110-Therapeutic exercises, 97530- Therapeutic activity, 97112- Neuromuscular re-education, 97535- Self Care, 97140- Manual therapy, [phone]- Gait training, [phone]- Orthotic Fit/training, [phone]- Canalith repositioning, [id number]- Aquatic Therapy, 97014- Electrical stimulation (unattended), [id number]- Electrical stimulation (manual), [id number]- Vasopneumatic device, [id number]- Ultrasound, [id number]- Traction (mechanical), [id number]- Ionotophoresis 4mg /ml Dexamethasone , Patient/Family education, Balance training, Stair training, Taping, Dry  Needling, Joint mobilization, Joint manipulation, Spinal manipulation, Spinal mobilization, Scar mobilization, Vestibular training, Visual/preceptual remediation/compensation, DME instructions, Cryotherapy, and Moist heat.  All performed as medically necessary.  All included unless contraindicated  PLAN FOR NEXT SESSION: Higher rep strengthening plan.    Ozell Silvan, PT, DPT, OCS, ATC 01/04/24  12:16 PM

## 2024-01-05 ENCOUNTER — Ambulatory Visit: Payer: No Typology Code available for payment source | Attending: Internal Medicine | Admitting: Internal Medicine

## 2024-01-05 ENCOUNTER — Other Ambulatory Visit (HOSPITAL_COMMUNITY)
Admission: RE | Admit: 2024-01-05 | Discharge: 2024-01-05 | Disposition: A | Discharge status: Home / Self Care | Payer: No Typology Code available for payment source | Source: Ambulatory Visit | Attending: Internal Medicine | Admitting: Internal Medicine

## 2024-01-05 VITALS — BP 177/80 | HR 78 | Temp 98.0°F | Ht 61.0 in | Wt 152.0 lb

## 2024-01-05 DIAGNOSIS — I10 Essential (primary) hypertension: Secondary | ICD-10-CM

## 2024-01-05 DIAGNOSIS — R631 Polydipsia: Secondary | ICD-10-CM | POA: Diagnosis not present

## 2024-01-05 DIAGNOSIS — Z124 Encounter for screening for malignant neoplasm of cervix: Secondary | ICD-10-CM | POA: Insufficient documentation

## 2024-01-05 DIAGNOSIS — N898 Other specified noninflammatory disorders of vagina: Secondary | ICD-10-CM

## 2024-01-05 DIAGNOSIS — R7303 Prediabetes: Secondary | ICD-10-CM

## 2024-01-05 LAB — POCT GLYCOSYLATED HEMOGLOBIN (HGB A1C): HbA1c, POC (prediabetic range): 6.2 % (ref 5.7–6.4)

## 2024-01-05 LAB — GLUCOSE, POCT (MANUAL RESULT ENTRY): POC Glucose: 94 mg/dL (ref 70–99)

## 2024-01-05 MED ORDER — NYSTATIN-TRIAMCINOLONE 100000-0.1 UNIT/GM-% EX OINT
TOPICAL_OINTMENT | CUTANEOUS | 0 refills | Status: DC
Start: 2024-01-05 — End: 2024-03-11

## 2024-01-05 MED ORDER — FLUCONAZOLE 150 MG PO TABS
ORAL_TABLET | ORAL | 0 refills | Status: DC
Start: 2024-01-05 — End: 2024-01-16

## 2024-01-05 NOTE — Progress Notes (Addendum)
 Patient ID: Misty Bailey, female    DOB: May 04, 1971  MRN: [id number]  CC: Gynecologic Exam (Pap. Durene itchiness X1 mo /Fatigue, increased thirst & hunger)   Subjective: Misty Bailey is a 53 y.o. female who presents for PAP. Her concerns today include:  Dysmenorrhea, uterine fibroid, diverticulosis, HL, HTN, hidradenitis, bronchospasm, B12 def. PreDM  GYN History:  Pt is G0P0 Any hx of abn paps?: one yrs ago Menses regular or irregular?: only 3 cycles last yr; last menses 10/2023 How long does menses last? 4-7 days Menstrual flow light or heavy?: heavy first 2 days Method of birth control?:  none Any vaginal dischg at this time?: still having a lot of vaginal itching even after Diflucan  given on last visit almost 2 months ago.  Wet prep was positive for Candida vaginitis. Dysuria?:  she thinks due to itching and irritation Any hx of STI?: Trichomonas treated Sexually active with how many partners: 1 female partner Desires STI screen:  yes Last MMG: 08/2022.  Wants to hold off until fully recovered from shoulder surgery.  Family hx of uterine, cervical or breast cancer?:  no  HTN:  BP elev.  Did not take meds as yet because she was trying to get out of the door in time to make it to this appointment.  Not checking BP.  Needs new battery for her device.  Wonders if she has DM.  Due to recurrent vaginal yeast. Endorses frequent thirst and tiredness.  She is fasting. Hx of preDM.    Patient Active Problem List   Diagnosis Date Noted   Adhesive capsulitis of right shoulder 11/28/2023   B12 deficiency 11/28/2023   Neuropathy 06/22/2023   Inguinal hernia of left side without obstruction or gangrene 04/30/2020   Hyperlipidemia 07/30/2019   Syncope 07/29/2019   Essential hypertension 07/29/2019   Dysmenorrhea 05/08/2018   Herpes 03/21/2014   Suppurative hidradenitis    Depression      Current Outpatient Medications on File Prior to Visit  Medication Sig Dispense  Refill   albuterol  (VENTOLIN  HFA) 108 (90 Base) MCG/ACT inhaler Inhale 2 puffs into the lungs every 6 (six) hours as needed for wheezing or shortness of breath. 8 g 4   amLODipine  (NORVASC ) 10 MG tablet Take 1 tablet (10 mg total) by mouth daily. 90 tablet 1   atorvastatin  (LIPITOR) 10 MG tablet TAKE 1 TABLET BY MOUTH EVERY DAY 90 tablet 1   gabapentin  (NEURONTIN ) 100 MG capsule Take 3 capsules (300 mg total) by mouth at bedtime as needed. 180 capsule 3   losartan  (COZAAR ) 25 MG tablet Take 1 tablet (25 mg total) by mouth daily. 90 tablet 1   desonide (DESOWEN) 0.05 % ointment Apply 1 Application topically daily as needed (rash). (Patient not taking: Reported on 01/05/2024)     fluticasone  (FLONASE ) 50 MCG/ACT nasal spray Place 1 spray into both nostrils daily. (Patient not taking: Reported on 01/05/2024) 16 g 1   Current Facility-Administered Medications on File Prior to Visit  Medication Dose Route Frequency Provider Last Rate Last Admin   cyanocobalamin  (VITAMIN B12) injection 1,000 mcg  1,000 mcg Intramuscular Once         Allergies  Allergen Reactions   Codeine  Shortness Of Breath    Social History   Socioeconomic History   Marital status: Single    Spouse name: Not on file   Number of children: 0   Years of education: Associates   Highest education level: Associate degree: occupational, scientist, product/process development, or vocational  program  Occupational History   Not on file  Tobacco Use   Smoking status: Former    Current packs/day: 0.00    Types: Cigarettes    Quit date: 1996    Years since quitting: 29.0   Smokeless tobacco: Never   Tobacco comments:    Weed  Vaping Use   Vaping status: Never Used  Substance and Sexual Activity   Alcohol use: Yes    Comment: social   Drug use: Yes    Frequency: 3.0 times per week    Types: Marijuana   Sexual activity: Not Currently    Partners: Male    Birth control/protection: None  Other Topics Concern   Not on file  Social History Narrative    Boyfriend incarcerated 2014-2018   Social Drivers of Health   Financial Resource Strain: Medium Risk (01/05/2024)   Overall Financial Resource Strain (CARDIA)    Difficulty of Paying Living Expenses: Somewhat hard  Food Insecurity: Food Insecurity Present (01/05/2024)   Hunger Vital Sign    Worried About Running Out of Food in the Last Year: Sometimes true    Ran Out of Food in the Last Year: Sometimes true  Transportation Needs: No Transportation Needs (01/05/2024)   PRAPARE - Administrator, Civil Service (Medical): No    Lack of Transportation (Non-Medical): No  Physical Activity: Insufficiently Active (01/05/2024)   Exercise Vital Sign    Days of Exercise per Week: 3 days    Minutes of Exercise per Session: 20 min  Stress: Stress Concern Present (01/05/2024)   [employer] of Occupational Health - Occupational Stress Questionnaire    Feeling of Stress : To some extent  Social Connections: Moderately Integrated (01/05/2024)   Social Connection and Isolation Panel [NHANES]    Frequency of Communication with Friends and Family: More than three times a week    Frequency of Social Gatherings with Friends and Family: Twice a week    Attends Religious Services: More than 4 times per year    Active Member of [employer] or Organizations: No    Attends Engineer, Structural: More than 4 times per year    Marital Status: Never married  Recent Concern: Social Connections - Moderately Isolated (10/26/2023)   Social Connection and Isolation Panel [NHANES]    Frequency of Communication with Friends and Family: More than three times a week    Frequency of Social Gatherings with Friends and Family: More than three times a week    Attends Religious Services: More than 4 times per year    Active Member of [employer] or Organizations: No    Attends Engineer, Structural: Not on file    Marital Status: Never married  Intimate Partner Violence: Not At Risk (11/10/2023)    Humiliation, Afraid, Rape, and Kick questionnaire    Fear of Current or Ex-Partner: No    Emotionally Abused: No    Physically Abused: No    Sexually Abused: No    Family History  Problem Relation Age of Onset   Diabetes Mother    Hypertension Mother    Hyperlipidemia Mother    Heart murmur Mother    Crohn's disease Mother    Hypertension Father    Stroke Father    Hyperlipidemia Father    Diabetes Sister    Hypertension Sister    Hyperlipidemia Sister    Hypertension Maternal Grandmother    Diabetes Maternal Grandmother    Osteoporosis Maternal Grandmother    Cirrhosis Maternal  Grandfather    Thyroid disease Paternal Aunt    Breast cancer Neg Hx     Past Surgical History:  Procedure Laterality Date   CHOLECYSTECTOMY N/A 06/28/2023   Procedure: LAPAROSCOPIC CHOLECYSTECTOMY;  Surgeon: Vernetta Berg, MD;  Location: [facility] OR;  Service: General;  Laterality: N/A;  TAP BLOCK   INGUINAL HERNIA REPAIR Left 06/28/2023   Procedure: OPEN LEFT INGUINAL HERNIA REPAIR WITH MESH;  Surgeon: Vernetta Berg, MD;  Location: [facility] OR;  Service: General;  Laterality: Left;   oral surgery     PELVIC LAPAROSCOPY  1998/1999 `   pelvic adhesions and pain   TOE SURGERY Left    great toe   WISDOM TOOTH EXTRACTION      ROS: Review of Systems Negative except as stated above  PHYSICAL EXAM: BP (!) 177/80   Pulse 78   Temp 98 F (36.7 C) (Oral)   Ht 5' 1 (1.549 m)   Wt 152 lb (68.9 kg)   SpO2 97%   BMI 28.72 kg/m   Physical Exam  General appearance - alert, well appearing, and in no distress Mental status - normal mood, behavior, speech, dress, motor activity, and thought processes Pelvic -slightly dusky appearance to external vaginal area.  Small amount of white discharge within the vaginal vault around the cervix.  Cervix appears grossly normal.  Uterus felt normal in size.  No adnexal masses appreciated.  Results for orders placed or performed in visit on 01/05/24  POCT glucose  (manual entry)   Collection Time: 01/05/24 11:24 AM  Result Value Ref Range   POC Glucose 94 70 - 99 mg/dl  POCT glycosylated hemoglobin (Hb A1C)   Collection Time: 01/05/24 11:28 AM  Result Value Ref Range   Hemoglobin A1C     HbA1c POC (<> result, manual entry)     HbA1c, POC (prediabetic range) 6.2 5.7 - 6.4 %   HbA1c, POC (controlled diabetic range)         Latest Ref Rng & Units 10/23/2023   11:46 AM 06/21/2023    2:00 PM 05/11/2023   12:09 PM  CMP  Glucose 70 - 99 mg/dL 892  898  889   BUN 6 - 20 mg/dL 15  15  17    Creatinine 0.44 - 1.00 mg/dL 9.03  8.90  8.97   Sodium 135 - 145 mmol/L 140  140  143   Potassium 3.5 - 5.1 mmol/L 3.8  3.6  4.2   Chloride 98 - 111 mmol/L 106  103  108   CO2 22 - 32 mmol/L 27  24  21    Calcium  8.9 - 10.3 mg/dL 9.6  9.7  9.4    Lipid Panel     Component Value Date/Time   CHOL 213 (H) 02/03/2021 1005   TRIG 78 02/03/2021 1005   HDL 56 02/03/2021 1005   CHOLHDL 3.8 02/03/2021 1005   LDLCALC 143 (H) 02/03/2021 1005    CBC    Component Value Date/Time   WBC 6.1 10/23/2023 1146   RBC 4.72 10/23/2023 1146   HGB 13.5 10/23/2023 1146   HGB 13.6 08/17/2021 1037   HCT 42.2 10/23/2023 1146   HCT 42.3 08/17/2021 1037   PLT 277 10/23/2023 1146   PLT 312 08/17/2021 1037   MCV 89.4 10/23/2023 1146   MCV 89 08/17/2021 1037   MCH 28.6 10/23/2023 1146   MCHC 32.0 10/23/2023 1146   RDW 14.0 10/23/2023 1146   RDW 12.6 08/17/2021 1037   LYMPHSABS 2.7 01/25/2018 1106  MONOABS 0.5 08/09/2013 1045   EOSABS 0.2 01/25/2018 1106   BASOSABS 0.0 01/25/2018 1106    ASSESSMENT AND PLAN: 1. Pap smear for cervical cancer screening (Primary) -She will let me know when she is ready to do her mammogram most likely when she completes physical therapy for her shoulder. - Cervicovaginal ancillary only - Cytology - PAP  2. Essential hypertension Not at goal.  Patient did not take medicines before she came out today.  She will take her medicines as soon as  she returns home.  Advised to check blood pressure at least once a week with goal being 130/80 or lower.  3. Polydipsia Still in range for prediabetes. Patient advised to eliminate sugary drinks from the diet, cut back on portion sizes especially of white carbohydrates, eat more white lean meat like chicken turkey and seafood instead of beef or pork and incorporate fresh fruits and vegetables into the diet daily.  - POCT glycosylated hemoglobin (Hb A1C) - POCT glucose (manual entry)  4. Itching of vagina -Will prescribe Diflucan  again for her to take daily for 2 days and then once a week for 3 weeks.  I have also given some Mycolog cream for her to use on the external vaginal area.  Advised not to put in the vagina. - fluconazole  (DIFLUCAN ) 150 MG tablet; 1 tab PO daily x 2 days then once a week  Dispense: 5 tablet; Refill: 0 - nystatin -triamcinolone  ointment (MYCOLOG); Apply to affected area once a day for 5 days then as needed.  Dispense: 30 g; Refill: 0  5. Prediabetes See #3 above.  Addendum 01/06/2024: My CMA Clarissa was present as chaperone when I performed the pelvic exam/Pap smear.  Patient was given the opportunity to ask questions.  Patient verbalized understanding of the plan and was able to repeat key elements of the plan.   This documentation was completed using Paediatric nurse.  Any transcriptional errors are unintentional.  Orders Placed This Encounter  Procedures   POCT glycosylated hemoglobin (Hb A1C)   POCT glucose (manual entry)     Requested Prescriptions   Signed Prescriptions Disp Refills   fluconazole  (DIFLUCAN ) 150 MG tablet 5 tablet 0    Sig: 1 tab PO daily x 2 days then once a week   nystatin -triamcinolone  ointment (MYCOLOG) 30 g 0    Sig: Apply to affected area once a day for 5 days then as needed.    Return in about 4 months (around 05/04/2024).  Barnie Louder, MD, FACP

## 2024-01-08 ENCOUNTER — Other Ambulatory Visit: Payer: Self-pay | Admitting: Internal Medicine

## 2024-01-08 LAB — CERVICOVAGINAL ANCILLARY ONLY
Bacterial Vaginitis (gardnerella): POSITIVE — AB
Candida Glabrata: NEGATIVE
Candida Vaginitis: NEGATIVE
Chlamydia: NEGATIVE
Comment: NEGATIVE
Comment: NEGATIVE
Comment: NEGATIVE
Comment: NEGATIVE
Comment: NEGATIVE
Comment: NORMAL
Neisseria Gonorrhea: NEGATIVE
Trichomonas: NEGATIVE

## 2024-01-08 MED ORDER — METRONIDAZOLE 0.75 % VA GEL: 1.0000 | Freq: Two times a day (BID) | VAGINAL | 0 refills | Status: DC

## 2024-01-09 ENCOUNTER — Ambulatory Visit (INDEPENDENT_AMBULATORY_CARE_PROVIDER_SITE_OTHER): Payer: No Typology Code available for payment source | Admitting: Physical Therapy

## 2024-01-09 ENCOUNTER — Encounter: Payer: Self-pay | Admitting: Physical Therapy

## 2024-01-09 DIAGNOSIS — M6281 Muscle weakness (generalized): Secondary | ICD-10-CM | POA: Diagnosis not present

## 2024-01-09 DIAGNOSIS — R600 Localized edema: Secondary | ICD-10-CM | POA: Diagnosis not present

## 2024-01-09 DIAGNOSIS — M25511 Pain in right shoulder: Secondary | ICD-10-CM

## 2024-01-09 LAB — CYTOLOGY - PAP
Comment: NEGATIVE
Comment: NEGATIVE
Comment: NEGATIVE
Diagnosis: UNDETERMINED — AB
HPV 16: NEGATIVE
HPV 18 / 45: NEGATIVE
High risk HPV: POSITIVE — AB

## 2024-01-09 NOTE — Therapy (Signed)
 OUTPATIENT PHYSICAL THERAPY TREATMENT   Patient Name: Misty Bailey MRN: [id number] DOB:[birth year], 53 y.o., female Today's Date: 01/09/2024   END OF SESSION:  PT End of Session - 01/09/24 1118     Visit Number 17    Number of Visits 20    Date for PT Re-Evaluation 01/12/24    Authorization - Number of Visits 58    Progress Note Due on Visit 20    PT Start Time 1105    PT Stop Time 1145    PT Time Calculation (min) 40 min    Activity Tolerance Patient limited by pain    Behavior During Therapy [facility] for tasks assessed/performed                  Past Medical History:  Diagnosis Date   Anemia    Anxiety    Arthritis 02/23/2014   Rib Cage   Asthma    Depression    Diverticulosis    Dysmenorrhea 05/08/2018   Essential hypertension 07/29/2019   HLD (hyperlipidemia)    Panic attacks    Pre-diabetes    Suppurative hidradenitis    Past Surgical History:  Procedure Laterality Date   CHOLECYSTECTOMY N/A 06/28/2023   Procedure: LAPAROSCOPIC CHOLECYSTECTOMY;  Surgeon: Vernetta Berg, MD;  Location: [facility] OR;  Service: General;  Laterality: N/A;  TAP BLOCK   INGUINAL HERNIA REPAIR Left 06/28/2023   Procedure: OPEN LEFT INGUINAL HERNIA REPAIR WITH MESH;  Surgeon: Vernetta Berg, MD;  Location: [facility] OR;  Service: General;  Laterality: Left;   oral surgery     PELVIC LAPAROSCOPY  1998/1999 `   pelvic adhesions and pain   TOE SURGERY Left    great toe   WISDOM TOOTH EXTRACTION     Patient Active Problem List   Diagnosis Date Noted   Adhesive capsulitis of right shoulder 11/28/2023   B12 deficiency 11/28/2023   Neuropathy 06/22/2023   Inguinal hernia of left side without obstruction or gangrene 04/30/2020   Hyperlipidemia 07/30/2019   Syncope 07/29/2019   Essential hypertension 07/29/2019   Dysmenorrhea 05/08/2018   Herpes 03/21/2014   Suppurative hidradenitis    Depression     PCP: Vicci Barnie NOVAK, MD   REFERRING PROVIDER: Vernetta Lonni GRADE*   REFERRING DIAG:  Diagnosis  M24.611 (ICD-10-CM) - Arthrofibrosis of right shoulder  Z98.890 (ICD-10-CM) - Status post arthroscopy of right shoulder    THERAPY DIAG:  Acute pain of right shoulder  Muscle weakness (generalized)  Localized edema  Rationale for Evaluation and Treatment: Rehabilitation  ONSET DATE: February 2024  SUBJECTIVE:  SUBJECTIVE STATEMENT: Pt arriving reporting 4-5/10 pain in her Rt shoulder.   PERTINENT HISTORY:  Pt s/p on 10/12/23 manipulation c debridement of anterior capsule,  inferior capsule and subacrominal space  PMH: anemia, anxiety, asthma, depression, diverticulosis, essential HTN, panic attacks, pre-diabetes, hernia repair, oral surgery, toe surgery, pelvic laparoscopy, cholecystectomy   PAIN:  NPRS scale: 5/10 Pain location: Rt shoulder- anterior and lateral, some posterior shoulder  Pain description: achy, sore, sharp at times, constant Aggravating factors: lifting my arm, ADL's Relieving factors: resting, pain meds as needed  PRECAUTIONS: None  WEIGHT BEARING RESTRICTIONS: No  FALLS:  Has patient fallen in last 6 months? Yes. Number of falls pt stating no injuries, pt stating she was dehydrated.   LIVING ENVIRONMENT: Lives with: lives with their family and lives alone Lives in: House/apartment Stairs: No Has following equipment at home: None  OCCUPATION: Out of work since May  PLOF: Independent  PATIENT GOALS: be able reach overhead   OBJECTIVE:   DIAGNOSTIC FINDINGS:  10/31/2023 review of chart:  IMPRESSION: 1. Mild tendinosis of the supraspinatus tendon anteriorly. 2. Mild subacromial/subdeltoid bursitis. 3. Mild relative thickening of the inferior joint capsule as can be seen with adhesive capsulitis.  PATIENT SURVEYS:   12/18/2023: FOTO update 46%  11/21/23: FOTO update 48%  10/31/23: FOTO intake:  41%      COGNITION: 10/31/2023 Overall cognitive status: WFL     SENSATION: 10/31/2023 Tingling in Rt fingers, pt feels that may be due to B-12 deficiency  POSTURE 10/31/2023 Forward head and rounded shoulders  UPPER EXTREMITY ROM:   ROM (* indicated pain) Right 10/31/23 Left eval Right  PROM 11/07/23 Rt 11/14/23 PROM supine Rt 11/21/23 PROM supine Rt 11/29/2023 AROM Rt 12/05/23 Rt 12/12/23 Rt 12/26/23 supine Rt 01/02/24 supine Rt 01/09/24 supine  Shoulder flexion 60 155 90* 110* 108 110 AA: 120 A: 124 A: 128 A: 125 A: 130  Shoulder extension 15 40           Shoulder abduction 65 162 80* 100*    A: 100 A: 110 A: 112 A: 115  Shoulder adduction             Shoulder internal rotation 50 75 60* 60 *         Shoulder external rotation 25 75 15-20* very guarded and pain limited  30 * 30   A: 50 A: 55 A: 54 A; 55  (Blank rows = not tested)  UPPER EXTREMITY MMT:  MMT Right 10/31/2023 Left 10/31/2023 Rt 12/18/23 Rt 01/09/24  Shoulder flexion 2 5 3  3+  Shoulder extension 3 5 3+ 3+  Shoulder abduction 2 5 3  3+  Shoulder adduction      Shoulder internal rotation 2 5 3    Shoulder external rotation 2 5 3    (Blank rows = not tested)   PALPATION:  10/31/2023 TTP: Rt upper trap, Rt bicep tendons. Rt deltoid, Rt supraspinatus.  TODAY'S TREATMENT:                                                                                            DATE: 01/09/24:  TherEx:  UBE: Level 3 x  4 minutes each direction, 1 minute rest break in between  Seated Cross arm stretch Door stretch bil UE's 60 deg holding 10 sec x 5  Side lying Rt shoulder abd x 20 Side lying Rt Shoulder ER x 20  Supine Rt shoulder flexion 1# x  20  Manual:  Grade 2-3 GH mobs in flexion and inferior glides    TODAY'S TREATMENT:                                                                                            DATE: 01/04/2024:  TherEx:  UBE fwd/back 3 mins each way with 1 min rest between lvl  2.5 Seated cross arm stretch 15 sec x 3  Supine Rt shoulder flexion 1 lb x 10, 0 lbs x 20 Sidelying Rt shoulder ER c towel under arm 1 lb x 10, 0 lbs  Sidelying Rt shoulder abduction AROM x 10   Manual:  Rt shoulder GH joint inferior glides G2-g3 in flexion, scaption.  Mobilization c movement c ER passive and posterior glide.    Vaso Rt shoulder 10 mins low compression 34 degrees  TODAY'S TREATMENT:                                                                                            DATE: 1/72025:  TherEx:  UBE: Level 3 x 3 minutes each direction Pulleys: flexion and abduction holding end range 3 seconds  Standing cross arm stretch x 2 holding 30 sec Door stretch bil UE's 60 deg holding 10 sec x 5  Standing AAROM flexion c 1 # bar 2 x 10  Standing AAROM abd 2 x 10 c 1 # bar Manual:  Grade 2-3 GH mobs in flexion and inferior glides Modalities:  Vaso-pneumatic: x 10 minutes, low compression      PATIENT EDUCATION: 11/29/2023 Education details: HEP update Person educated: Patient Education method: Programmer, Multimedia, Facilities Manager, Verbal cues, and Handouts Education comprehension: verbalized understanding, returned demonstration, and verbal cues required  HOME EXERCISE PROGRAM: Access Code: [email or web address] URL: [email or web address] Date: 11/29/2023 Prepared by: Ozell Silvan  Exercises - Supine Shoulder Flexion Extension AAROM with Dowel  - 2-3 x daily -  7 x weekly - 2 sets - 10 reps - 2 seconds hold - Supine Shoulder External Rotation in 45 Degrees Abduction AAROM with Dowel (Mirrored)  - 2-3 x daily - 7 x weekly - 2 sets - 10 reps - 2 seconds hold - Standing Shoulder Posterior Capsule Stretch (Mirrored)   - 2-3 x daily - 7 x weekly - 1 sets - 3 reps - 15-30 hold - Sleeper Stretch (Mirrored)  - 2-3 x daily - 7 x weekly - 1 sets - 3 reps - 30 hold - Isometric Shoulder Flexion at Wall  - 1-2 x daily - 7 x weekly - 1 sets - 10 reps - 5 secibds hold - Standing Isometric Shoulder Internal Rotation at Doorway  - 1-2 x daily - 7 x weekly - 1 sets - 10 reps - 5 seconds hold - Standing Isometric Shoulder External Rotation with Doorway  - 1-2 x daily - 7 x weekly - 1 sets - 10 reps - 5 seconds hold - Standing Shoulder Row with Anchored Resistance  - 1 x daily - 7 x weekly - 1-2 sets - 10-15 reps - Shoulder Extension with Resistance  - 1 x daily - 7 x weekly - 1-2 sets - 10-15 reps  ASSESSMENT:  CLINICAL IMPRESSION: Pt still with muscle guarding during abd and ER motions at end ranges. Treatment included more reps for increased muscle strength and endurance. Continue skilled PT per pt's treatment plan.    OBJECTIVE IMPAIRMENTS: decreased ROM, decreased strength, increased edema, impaired UE functional use, postural dysfunction, and pain.   ACTIVITY LIMITATIONS: carrying, sleeping, bathing, toileting, dressing, reach over head, and hygiene/grooming  PARTICIPATION LIMITATIONS: meal prep, cleaning, laundry, driving, shopping, community activity, and occupation  PERSONAL FACTORS: 3+ comorbidities: see pertinent history  are also affecting patient's functional outcome.   REHAB POTENTIAL: Good  CLINICAL DECISION MAKING: Stable/uncomplicated  EVALUATION COMPLEXITY: Low   GOALS: Goals reviewed with patient? Yes  SHORT TERM GOALS: (target date for Short term goals are 3 weeks 11/24/23)  1.Patient will demonstrate independent use of home exercise program to maintain progress from in clinic treatments. Goal status: MET 11/14/23  LONG TERM GOALS: (target dates for all long term goals are 10 weeks  01/11/23 )   1. Patient will demonstrate/report pain at worst less than or equal to 2/10 to facilitate  minimal limitation in daily activity secondary to pain symptoms. Goal status: on going 01/01/23   2. Patient will demonstrate independent use of home exercise program to facilitate ability to maintain/progress functional gains from skilled physical therapy services. Goal status: on going 01/01/23   3. Patient will demonstrate FOTO outcome > or = 61 % to indicate reduced disability due to condition. Goal status: on going 01/01/23   4.  Patient will demonstrate Rt UE MMT >/= 4/5 throughout to facilitate lifting, reaching, carrying at [facility] in daily activity.   Goal status: on going 01/01/23   5.  Patient will demonstrate Rt GH joint AROM WFL s symptoms to facilitate usual overhead reaching, self care, dressing at PLOF.    Goal status: on going 01/01/23   6.  Pt will improve her Rt shoulder flexion to >/= 140 degrees for improvements in ADL's.  Goal status: on going 12/18/23  7. Pt will improve her Rt shoulder ER to >/= 60 degrees in order to improved functional actives.   Goal Status: on going 01/01/23     PLAN:  PT FREQUENCY: 1-2x/week  PT DURATION: 10 weeks  PLANNED INTERVENTIONS:  Can include 02853- PT Re-evaluation, 97110-Therapeutic exercises, 97530- Therapeutic activity, [id number]- Neuromuscular re-education, [phone]- Self Care, 97140- Manual therapy, [phone]- Gait training, [phone]- Orthotic Fit/training, [phone]- Canalith repositioning, [id number]- Aquatic Therapy, 97014- Electrical stimulation (unattended), [id number]- Electrical stimulation (manual), [id number]- Vasopneumatic device, [id number]- Ultrasound, [id number]- Traction (mechanical), [id number]- Ionotophoresis 4mg /ml Dexamethasone , Patient/Family education, Balance training, Stair training, Taping, Dry Needling, Joint mobilization, Joint manipulation, Spinal manipulation, Spinal mobilization, Scar mobilization, Vestibular training, Visual/preceptual remediation/compensation, DME instructions, Cryotherapy, and Moist heat.  All performed as medically necessary.  All included  unless contraindicated  PLAN FOR NEXT SESSION: Higher rep strengthening plan.    Delon Lunger, PT, MPT 01/09/24 11:23 AM   01/09/24  11:23 AM

## 2024-01-10 ENCOUNTER — Encounter: Payer: Self-pay | Admitting: Internal Medicine

## 2024-01-10 ENCOUNTER — Other Ambulatory Visit: Payer: Self-pay | Admitting: Internal Medicine

## 2024-01-10 DIAGNOSIS — R87618 Other abnormal cytological findings on specimens from cervix uteri: Secondary | ICD-10-CM

## 2024-01-11 ENCOUNTER — Telehealth: Payer: Self-pay

## 2024-01-11 NOTE — Telephone Encounter (Signed)
FMLA form to be completed  Received 01/11/24 Due  Claim # [id number]  Pt will need OV for discussion, re-eval, and form completion. Please reach out to pt to assist with scheduling. Thank you!

## 2024-01-12 ENCOUNTER — Ambulatory Visit: Payer: No Typology Code available for payment source | Admitting: Family Medicine

## 2024-01-12 ENCOUNTER — Ambulatory Visit (INDEPENDENT_AMBULATORY_CARE_PROVIDER_SITE_OTHER): Payer: No Typology Code available for payment source | Admitting: Rehabilitative and Restorative Service Providers"

## 2024-01-12 ENCOUNTER — Encounter: Payer: Self-pay | Admitting: Rehabilitative and Restorative Service Providers"

## 2024-01-12 DIAGNOSIS — R600 Localized edema: Secondary | ICD-10-CM

## 2024-01-12 DIAGNOSIS — M25511 Pain in right shoulder: Secondary | ICD-10-CM | POA: Diagnosis not present

## 2024-01-12 DIAGNOSIS — M6281 Muscle weakness (generalized): Secondary | ICD-10-CM | POA: Diagnosis not present

## 2024-01-12 NOTE — Therapy (Signed)
OUTPATIENT PHYSICAL THERAPY TREATMENT   Patient Name: Misty Bailey MRN: [id number] DOB:07-15-1971, 53 y.o., female Today's Date: 01/12/2024   END OF SESSION:  PT End of Session - 01/12/24 1020     Visit Number 18    Number of Visits 20    Date for PT Re-Evaluation 01/12/24    Authorization Type [employer] $40 copay    Authorization - Number of Visits 58    Progress Note Due on Visit 20    PT Start Time 1020    PT Stop Time 1100    PT Time Calculation (min) 40 min              Past Medical History:  Diagnosis Date   Anemia    Anxiety    Arthritis 02/23/2014   Rib Cage   Asthma    Depression    Diverticulosis    Dysmenorrhea 05/08/2018   Essential hypertension 07/29/2019   HLD (hyperlipidemia)    Panic attacks    Pre-diabetes    Suppurative hidradenitis    Past Surgical History:  Procedure Laterality Date   CHOLECYSTECTOMY N/A 06/28/2023   Procedure: LAPAROSCOPIC CHOLECYSTECTOMY;  Surgeon: Abigail Miyamoto, MD;  Location: [facility] OR;  Service: General;  Laterality: N/A;  TAP BLOCK   INGUINAL HERNIA REPAIR Left 06/28/2023   Procedure: OPEN LEFT INGUINAL HERNIA REPAIR WITH MESH;  Surgeon: Abigail Miyamoto, MD;  Location: [facility] OR;  Service: General;  Laterality: Left;   oral surgery     PELVIC LAPAROSCOPY  1998/1999 `   pelvic adhesions and pain   TOE SURGERY Left    great toe   WISDOM TOOTH EXTRACTION     Patient Active Problem List   Diagnosis Date Noted   Adhesive capsulitis of right shoulder 11/28/2023   B12 deficiency 11/28/2023   Neuropathy 06/22/2023   Inguinal hernia of left side without obstruction or gangrene 04/30/2020   Hyperlipidemia 07/30/2019   Syncope 07/29/2019   Essential hypertension 07/29/2019   Dysmenorrhea 05/08/2018   Herpes 03/21/2014   Suppurative hidradenitis    Depression     PCP: Marcine Matar, MD   REFERRING PROVIDER: Kathryne Hitch*   REFERRING DIAG:  Diagnosis  M24.611 (ICD-10-CM) - Arthrofibrosis of right  shoulder  Z98.890 (ICD-10-CM) - Status post arthroscopy of right shoulder    THERAPY DIAG:  Acute pain of right shoulder  Muscle weakness (generalized)  Localized edema  Rationale for Evaluation and Treatment: Rehabilitation  ONSET DATE: February 2024  SUBJECTIVE:  SUBJECTIVE STATEMENT: Patient endorses some soreness but overall doing well. Has been doing HEP. Has grandson (2yo) this weekend and will just be using right arm or putting him on her back to carry to prevent L shoulder pain.  PERTINENT HISTORY: Pt s/p on 10/12/23 manipulation c debridement of anterior capsule,  inferior capsule and subacrominal space  PMH: anemia, anxiety, asthma, depression, diverticulosis, essential HTN, panic attacks, pre-diabetes, hernia repair, oral surgery, toe surgery, pelvic laparoscopy, cholecystectomy   PAIN:  NPRS scale: 5/10, forearm soreness Pain location: Rt shoulder- anterior and lateral, some posterior shoulder  Pain description: achy, sore, sharp at times, constant Aggravating factors: lifting my arm, ADL's Relieving factors: resting, pain meds as needed  PRECAUTIONS: None  WEIGHT BEARING RESTRICTIONS: No  FALLS:  Has patient fallen in last 6 months? Yes. Number of falls pt stating no injuries, pt stating she was dehydrated.   LIVING ENVIRONMENT: Lives with: lives with their family and lives alone Lives in: House/apartment Stairs: No Has following equipment at home: None  OCCUPATION: Out of work since May  PLOF: Independent  PATIENT GOALS: be able reach overhead   OBJECTIVE:   DIAGNOSTIC FINDINGS:  10/31/2023 review of chart:  IMPRESSION: 1. Mild tendinosis of the supraspinatus tendon anteriorly. 2. Mild subacromial/subdeltoid bursitis. 3. Mild relative thickening of the inferior joint  capsule as can be seen with adhesive capsulitis.  PATIENT SURVEYS:  12/18/2023: FOTO update 46%  11/21/23: FOTO update 48%  10/31/23: FOTO intake:  41%      COGNITION: 10/31/2023 Overall cognitive status: WFL     SENSATION: 10/31/2023 Tingling in Rt fingers, pt feels that may be due to B-12 deficiency  POSTURE 10/31/2023 Forward head and rounded shoulders  UPPER EXTREMITY ROM:   ROM (* indicated pain) Right 10/31/23 Left eval Right  PROM 11/07/23 Rt 11/14/23 PROM supine Rt 11/21/23 PROM supine Rt 11/29/2023 AROM Rt 12/05/23 Rt 12/12/23 Rt 12/26/23 supine Rt 01/02/24 supine Rt 01/09/24 supine  Shoulder flexion 60 155 90* 110* 108 110 AA: 120 A: 124 A: 128 A: 125 A: 130  Shoulder extension 15 40           Shoulder abduction 65 162 80* 100*    A: 100 A: 110 A: 112 A: 115  Shoulder adduction             Shoulder internal rotation 50 75 60* 60 *         Shoulder external rotation 25 75 15-20* very guarded and pain limited  30 * 30   A: 50 A: 55 A: 54 A; 55  (Blank rows = not tested)  UPPER EXTREMITY MMT:  MMT Right 10/31/2023 Left 10/31/2023 Rt 12/18/23 Rt 01/09/24  Shoulder flexion 2 5 3  3+  Shoulder extension 3 5 3+ 3+  Shoulder abduction 2 5 3  3+  Shoulder adduction      Shoulder internal rotation 2 5 3    Shoulder external rotation 2 5 3    (Blank rows = not tested)   PALPATION:  10/31/2023 TTP: Rt upper trap, Rt bicep tendons. Rt deltoid, Rt supraspinatus.  TODAY'S TREATMENT:                                                                                            DATE: 01/12/24: TherEx:  UBE: Level 3 x  4 minutes each direction, 1 minute rest break in between  Cross arm stretch x30sec throughout session   Side lying Rt shoulder abd x 20 Side lying Rt Shoulder ER x  20  Prone Y and T RUE only x10 ea; tc for reduction of compensatory strategies  Manual: Grade II-III GH A-P, inferior, and P-A glides in varied degrees of abduction for facilitation of movement Bicep soft tissue mobilization to tolerance for reduction of pain and increased mobility due to patient guarding  TODAY'S TREATMENT:                                                                                            DATE: 01/09/24:  TherEx:  UBE: Level 3 x  4 minutes each direction, 1 minute rest break in between  Seated Cross arm stretch Door stretch bil UE's 60 deg holding 10 sec x 5  Side lying Rt shoulder abd x 20 Side lying Rt Shoulder ER x 20  Supine Rt shoulder flexion 1# x 20  Manual:  Grade 2-3 GH mobs in flexion and inferior glides    TODAY'S TREATMENT:                                                                                            DATE: 01/04/2024:  TherEx:  UBE fwd/back 3 mins each way with 1 min rest between lvl  2.5 Seated cross arm stretch 15 sec x 3  Supine Rt shoulder flexion 1 lb x 10, 0 lbs x 20 Sidelying Rt shoulder ER c towel under arm 1 lb x 10, 0 lbs  Sidelying Rt shoulder abduction AROM x 10   Manual:  Rt shoulder GH joint inferior glides G2-g3 in flexion, scaption.  Mobilization c movement c ER passive and posterior glide.    Vaso Rt shoulder 10 mins low compression 34 degrees  TODAY'S TREATMENT:  DATE: 1/72025:  TherEx:  UBE: Level 3 x 3 minutes each direction Pulleys: flexion and abduction holding end range 3 seconds  Standing cross arm stretch x 2 holding 30 sec Door stretch bil UE's 60 deg holding 10 sec x 5  Standing AAROM flexion c 1 # bar 2 x 10  Standing AAROM abd 2 x 10 c 1 # bar Manual:  Grade 2-3 GH mobs in flexion and inferior glides Modalities:  Vaso-pneumatic: x 10 minutes, low compression      PATIENT  EDUCATION: 11/29/2023 Education details: HEP update Person educated: Patient Education method: Programmer, multimedia, Facilities manager, Verbal cues, and Handouts Education comprehension: verbalized understanding, returned demonstration, and verbal cues required  HOME EXERCISE PROGRAM: Access Code: [email or web address] URL: [email or web address] Date: 11/29/2023 Prepared by: Chyrel Masson  Exercises - Supine Shoulder Flexion Extension AAROM with Dowel  - 2-3 x daily - 7 x weekly - 2 sets - 10 reps - 2 seconds hold - Supine Shoulder External Rotation in 45 Degrees Abduction AAROM with Dowel (Mirrored)  - 2-3 x daily - 7 x weekly - 2 sets - 10 reps - 2 seconds hold - Standing Shoulder Posterior Capsule Stretch (Mirrored)  - 2-3 x daily - 7 x weekly - 1 sets - 3 reps - 15-30 hold - Sleeper Stretch (Mirrored)  - 2-3 x daily - 7 x weekly - 1 sets - 3 reps - 30 hold - Isometric Shoulder Flexion at Wall  - 1-2 x daily - 7 x weekly - 1 sets - 10 reps - 5 secibds hold - Standing Isometric Shoulder Internal Rotation at Doorway  - 1-2 x daily - 7 x weekly - 1 sets - 10 reps - 5 seconds hold - Standing Isometric Shoulder External Rotation with Doorway  - 1-2 x daily - 7 x weekly - 1 sets - 10 reps - 5 seconds hold - Standing Shoulder Row with Anchored Resistance  - 1 x daily - 7 x weekly - 1-2 sets - 10-15 reps - Shoulder Extension with Resistance  - 1 x daily - 7 x weekly - 1-2 sets - 10-15 reps  ASSESSMENT:  CLINICAL IMPRESSION: Patient demonstrated improved active ROM during activity with reduced shoulder shrug. Patient continues to lack ROM in ER with guarding present during passive stretching via therapist. Patient continues to demonstrate deficits in strength, ROM, and mobility and will benefit from continued skilled physical therapy for return to PLOF.   OBJECTIVE IMPAIRMENTS: decreased ROM, decreased strength, increased edema, impaired UE functional use, postural dysfunction, and pain.   ACTIVITY  LIMITATIONS: carrying, sleeping, bathing, toileting, dressing, reach over head, and hygiene/grooming  PARTICIPATION LIMITATIONS: meal prep, cleaning, laundry, driving, shopping, community activity, and occupation  PERSONAL FACTORS: 3+ comorbidities: see pertinent history  are also affecting patient's functional outcome.   REHAB POTENTIAL: Good  CLINICAL DECISION MAKING: Stable/uncomplicated  EVALUATION COMPLEXITY: Low   GOALS: Goals reviewed with patient? Yes  SHORT TERM GOALS: (target date for Short term goals are 3 weeks 11/24/23)  1.Patient will demonstrate independent use of home exercise program to maintain progress from in clinic treatments. Goal status: MET 11/14/23  LONG TERM GOALS: (target dates for all long term goals are 10 weeks  01/11/23 )   1. Patient will demonstrate/report pain at worst less than or equal to 2/10 to facilitate minimal limitation in daily activity secondary to pain symptoms. Goal status: on going 01/01/23   2. Patient will demonstrate independent use of home exercise program to facilitate ability to maintain/progress functional gains  from skilled physical therapy services. Goal status: on going 01/01/23   3. Patient will demonstrate FOTO outcome > or = 61 % to indicate reduced disability due to condition. Goal status: on going 01/01/23   4.  Patient will demonstrate Rt UE MMT >/= 4/5 throughout to facilitate lifting, reaching, carrying at [facility] in daily activity.   Goal status: on going 01/01/23   5.  Patient will demonstrate Rt GH joint AROM WFL s symptoms to facilitate usual overhead reaching, self care, dressing at PLOF.    Goal status: on going 01/01/23   6.  Pt will improve her Rt shoulder flexion to >/= 140 degrees for improvements in ADL's.  Goal status: on going 12/18/23  7. Pt will improve her Rt shoulder ER to >/= 60 degrees in order to improved functional actives.   Goal Status: on going 01/01/23     PLAN:  PT FREQUENCY: 1-2x/week  PT  DURATION: 10 weeks  PLANNED INTERVENTIONS: Can include 84132- PT Re-evaluation, 97110-Therapeutic exercises, 97530- Therapeutic activity, 97112- Neuromuscular re-education, 97535- Self Care, 97140- Manual therapy, [phone]- Gait training, [phone]- Orthotic Fit/training, [phone]- Canalith repositioning, [id number]- Aquatic Therapy, 97014- Electrical stimulation (unattended), [id number]- Electrical stimulation (manual), [id number]- Vasopneumatic device, [id number]- Ultrasound, [id number]- Traction (mechanical), [id number]- Ionotophoresis 4mg /ml Dexamethasone, Patient/Family education, Balance training, Stair training, Taping, Dry Needling, Joint mobilization, Joint manipulation, Spinal manipulation, Spinal mobilization, Scar mobilization, Vestibular training, Visual/preceptual remediation/compensation, DME instructions, Cryotherapy, and Moist heat.  All performed as medically necessary.  All included unless contraindicated  PLAN FOR NEXT SESSION: Stretching in ER via dowel in supine to prevent compensatory muscle strategies/activation. Continue ROM and strengthening plan.   Leroy Pettway, Armentha Branagan, Student-PT 01/12/2024, 11:25 AM

## 2024-01-15 NOTE — Telephone Encounter (Signed)
Dr. [name],  This form summarizes episodes of care. Please review and provide reason(s) that you do or do not agree with Dr. Eliberto Ivory recommendation to return to work.   Form placed in Dr. Zollie Pee box.

## 2024-01-15 NOTE — Progress Notes (Unsigned)
   [name], PhD, LAT, ATC acting as a scribe for [name], MD.  Misty Bailey is a 53 y.o. female who presents to [facility] at [facility] today for f/u R shoulder pain and FMLA paperwork update. Pt was last seen by Dr. [name] on 11/28/23 and was given a R GH steroid injection and was advised to cont PT, completing 18 visits.  Today, pt reports R shoulder is starting to improved some. She notes increase in her AROM and strength. Cont'd limitation w/ IR. She has been receiving duplicate paperwork from her insurance company. She wants to continue current work restrictions through March, until after her f/u visit w/ Dr. [name].   Her last visit with Dr. [name] was on November 20, 2023.  She has been continuing physical therapy.  She has improved range of motion and physical therapy but she notes continued pain in her shoulder especially with her arm in a flexed extended position.  She notes even a typical desk position is uncomfortable for her.  Dx testing: 06/19/23 C-spine & R shoulder MRI              06/15/23 NCV study 05/24/23 R shoulder & C-spine XR             05/15/23 Labs  Pertinent review of systems: No fevers or chills  Relevant historical information: Hypertension   Exam:  BP (!) 158/96   Pulse 74   Ht 5\' 1"  (1.549 m)   Wt 147 lb (66.7 kg)   SpO2 98%   BMI 27.78 kg/m  General: Well Developed, well nourished, and in no acute distress.   MSK: Right shoulder normal-appearing Range of motion abduction 110 degrees.  Forward flexion about 120 degrees. Patient does experience significant pain with forward flexion beyond 80 degrees and abduction beyond 90 degrees.       Assessment and Plan: 53 y.o. female with chronic right shoulder pain improving with physical therapy but not fully resolved.  She has improved her range of motion with physical therapy but still experiences significant pain with typical forward flexed position required for desk  work.  She is not ready to return to work even at a sedentary capacity at this time.  I have filled paperwork out for her long-term disability employer.  There seems to be some confusion about when she has been authorized to return to work.  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  Her orthopedic surgeon Dr. [name] wrote a return to work statement on January 8.  However I do not think she is ready return to work just yet.  I think it is possible that Dr. [name] her general surgeon returned her to work in November following her gallbladder surgery earlier this year.  Since that time her shoulder pain is worsened and she required shoulder surgery.   Discussed warning signs or symptoms. Please see discharge instructions. Patient expresses understanding.   The above documentation has been reviewed and is accurate and complete [name], M.D.  Total encounter time 30 minutes including face-to-face time with the patient and, reviewing past medical record, and charting on the date of service.

## 2024-01-16 ENCOUNTER — Encounter: Payer: No Typology Code available for payment source | Admitting: Physical Therapy

## 2024-01-16 ENCOUNTER — Encounter: Payer: Self-pay | Admitting: Family Medicine

## 2024-01-16 ENCOUNTER — Ambulatory Visit (INDEPENDENT_AMBULATORY_CARE_PROVIDER_SITE_OTHER): Payer: No Typology Code available for payment source | Admitting: Family Medicine

## 2024-01-16 VITALS — BP 158/96 | HR 74 | Ht 61.0 in | Wt 147.0 lb

## 2024-01-16 DIAGNOSIS — G8929 Other chronic pain: Secondary | ICD-10-CM

## 2024-01-16 DIAGNOSIS — M25511 Pain in right shoulder: Secondary | ICD-10-CM

## 2024-01-16 NOTE — Patient Instructions (Addendum)
Thank you for coming in today.    

## 2024-01-17 NOTE — Telephone Encounter (Signed)
Form completed / letter drafted, review and signed by Dr. [name] and placed at the front desk for faxing/scanning.

## 2024-01-18 ENCOUNTER — Encounter: Payer: Self-pay | Admitting: Rehabilitative and Restorative Service Providers"

## 2024-01-18 ENCOUNTER — Ambulatory Visit: Payer: No Typology Code available for payment source | Admitting: Rehabilitative and Restorative Service Providers"

## 2024-01-18 DIAGNOSIS — R600 Localized edema: Secondary | ICD-10-CM

## 2024-01-18 DIAGNOSIS — M25511 Pain in right shoulder: Secondary | ICD-10-CM

## 2024-01-18 DIAGNOSIS — M6281 Muscle weakness (generalized): Secondary | ICD-10-CM | POA: Diagnosis not present

## 2024-01-18 NOTE — Therapy (Signed)
OUTPATIENT PHYSICAL THERAPY TREATMENT / PROGRESS NOTE/ RECERT   Patient Name: Mariene Siggers [place] MRN: [id number] DOB:[birth year], 53 y.o., female Today's Date: 01/18/2024   Progress Note Reporting Period 12/12/2023  to 01/18/2024  See note below for Objective Data and Assessment of Progress/Goals.    END OF SESSION:  PT End of Session - 01/18/24 0859     Visit Number 19    Number of Visits 24    Date for PT Re-Evaluation 02/29/24    Authorization Type [employer] $40 copay    Authorization - Number of Visits 58    Progress Note Due on Visit 20    PT Start Time 0851    PT Stop Time 0929    PT Time Calculation (min) 38 min    Activity Tolerance Patient limited by pain    Behavior During Therapy [facility] for tasks assessed/performed               Past Medical History:  Diagnosis Date   Anemia    Anxiety    Arthritis 02/23/2014   Rib Cage   Asthma    Depression    Diverticulosis    Dysmenorrhea 05/08/2018   Essential hypertension 07/29/2019   HLD (hyperlipidemia)    Panic attacks    Pre-diabetes    Suppurative hidradenitis    Past Surgical History:  Procedure Laterality Date   CHOLECYSTECTOMY N/A 06/28/2023   Procedure: LAPAROSCOPIC CHOLECYSTECTOMY;  Surgeon: Abigail Miyamoto, MD;  Location: [facility] OR;  Service: General;  Laterality: N/A;  TAP BLOCK   INGUINAL HERNIA REPAIR Left 06/28/2023   Procedure: OPEN LEFT INGUINAL HERNIA REPAIR WITH MESH;  Surgeon: Abigail Miyamoto, MD;  Location: [facility] OR;  Service: General;  Laterality: Left;   oral surgery     PELVIC LAPAROSCOPY  1998/1999 `   pelvic adhesions and pain   TOE SURGERY Left    great toe   WISDOM TOOTH EXTRACTION     Patient Active Problem List   Diagnosis Date Noted   Adhesive capsulitis of right shoulder 11/28/2023   B12 deficiency 11/28/2023   Neuropathy 06/22/2023   Inguinal hernia of left side without obstruction or gangrene 04/30/2020   Hyperlipidemia 07/30/2019   Syncope 07/29/2019   Essential hypertension  07/29/2019   Dysmenorrhea 05/08/2018   Herpes 03/21/2014   Suppurative hidradenitis    Depression     PCP: Marcine Matar, MD   REFERRING PROVIDER: Kathryne Hitch*   REFERRING DIAG:  Diagnosis  M24.611 (ICD-10-CM) - Arthrofibrosis of right shoulder  Z98.890 (ICD-10-CM) - Status post arthroscopy of right shoulder    THERAPY DIAG:  Acute pain of right shoulder  Muscle weakness (generalized)  Localized edema  Rationale for Evaluation and Treatment: Rehabilitation  ONSET DATE: February 2024  SUBJECTIVE:  SUBJECTIVE STATEMENT: Pt indicated not feeling well today (illness).  Pt indicated arm symptoms around 4/10.   PERTINENT HISTORY: Pt s/p on 10/12/23 manipulation c debridement of anterior capsule,  inferior capsule and subacrominal space  PMH: anemia, anxiety, asthma, depression, diverticulosis, essential HTN, panic attacks, pre-diabetes, hernia repair, oral surgery, toe surgery, pelvic laparoscopy, cholecystectomy   PAIN:  NPRS scale: 4/10 Pain location: Rt shoulder- anterior and lateral, some posterior shoulder  Pain description: achy, sore, sharp at times, constant Aggravating factors: lifting my arm, ADL's Relieving factors: resting, pain meds as needed  PRECAUTIONS: None  WEIGHT BEARING RESTRICTIONS: No  FALLS:  Has patient fallen in last 6 months? Yes. Number of falls pt stating no injuries, pt stating she was dehydrated.   LIVING ENVIRONMENT: Lives with: lives with their family and lives alone Lives in: House/apartment Stairs: No Has following equipment at home: None  OCCUPATION: Out of work since May  PLOF: Independent  PATIENT GOALS: be able reach overhead   OBJECTIVE:   DIAGNOSTIC FINDINGS:  10/31/2023 review of chart:  IMPRESSION: 1. Mild tendinosis  of the supraspinatus tendon anteriorly. 2. Mild subacromial/subdeltoid bursitis. 3. Mild relative thickening of the inferior joint capsule as can be seen with adhesive capsulitis.  PATIENT SURVEYS:  01/18/2024:  FOTO update 62  12/18/2023: FOTO update 46%  11/21/23: FOTO update 48%  10/31/23: FOTO intake:  41%      COGNITION: 10/31/2023 Overall cognitive status: WFL     SENSATION: 10/31/2023 Tingling in Rt fingers, pt feels that may be due to B-12 deficiency  POSTURE 10/31/2023 Forward head and rounded shoulders  UPPER EXTREMITY ROM:   ROM (* indicated pain) Right 10/31/23 Left eval Right  PROM 11/07/23 Rt 11/14/23 PROM supine Rt 11/21/23 PROM supine Rt 11/29/2023 AROM Rt 12/05/23 Rt 12/12/23 Rt 12/26/23 supine Rt 01/02/24 supine Rt 01/09/24 supine  Shoulder flexion 60 155 90* 110* 108 110 AA: 120 A: 124 A: 128 A: 125 A: 130  Shoulder extension 15 40           Shoulder abduction 65 162 80* 100*    A: 100 A: 110 A: 112 A: 115  Shoulder adduction             Shoulder internal rotation 50 75 60* 60 *         Shoulder external rotation 25 75 15-20* very guarded and pain limited  30 * 30   A: 50 A: 55 A: 54 A; 55  (Blank rows = not tested)  UPPER EXTREMITY MMT:  MMT Right 10/31/2023 Left 10/31/2023 Rt 12/18/23 Rt 01/09/24 Right 01/18/2024  Shoulder flexion 2 5 3  3+ 4/5  Shoulder extension 3 5 3+ 3+   Shoulder abduction 2 5 3  3+ 3+/5  Shoulder adduction       Shoulder internal rotation 2 5 3   5/5  Shoulder external rotation 2 5 3   4/5  (Blank rows = not tested)   PALPATION:  10/31/2023 TTP: Rt upper trap, Rt bicep tendons. Rt deltoid, Rt supraspinatus.  TODAY'S TREATMENT:                                                                                            DATE:  01/18/2024:  TherEx:  Pulleys flexion 3 mins, scaption 3 mins with outstretched arm and eccentric lowering focus Tband rows blue band bilaterally 2 x 15 Tband gh ext blue band bilaterally 2 x 15 Tband Green band Rt ER walk outs with arm at side with towel 5 sec hold x 12  Sidelying Rt shoulder abduction 1 lb 2 x 15 Sidelying Rt shoulder flexion 2 x 15       TODAY'S TREATMENT:                                                                                            DATE: 01/12/24: TherEx:  UBE: Level 3 x  4 minutes each direction, 1 minute rest break in between  Cross arm stretch x30sec throughout session   Side lying Rt shoulder abd x 20 Side lying Rt Shoulder ER x 20  Prone Y and T RUE only x10 ea; tc for reduction of compensatory strategies  Manual: Grade II-III GH A-P, inferior, and P-A glides in varied degrees of abduction for facilitation of movement Bicep soft tissue mobilization to tolerance for reduction of pain and increased mobility due to patient guarding  TODAY'S TREATMENT:                                                                                            DATE: 01/09/24:  TherEx:  UBE: Level 3 x  4 minutes each direction, 1 minute rest break in between  Seated Cross arm stretch Door stretch bil UE's 60 deg holding 10 sec x 5  Side lying Rt shoulder abd x 20 Side lying Rt Shoulder ER x 20  Supine Rt shoulder flexion 1# x 20  Manual:  Grade 2-3 GH mobs in flexion and inferior glides    TODAY'S TREATMENT:  DATE: 01/04/2024:  TherEx:  UBE fwd/back 3 mins each way with 1 min rest between lvl  2.5 Seated cross arm stretch 15 sec x 3  Supine Rt shoulder flexion 1 lb x 10, 0 lbs x 20 Sidelying Rt shoulder ER c towel under arm 1 lb x 10, 0 lbs  Sidelying Rt shoulder abduction AROM x 10   Manual:  Rt shoulder GH joint inferior glides G2-g3 in flexion, scaption.  Mobilization  c movement c ER passive and posterior glide.    Vaso Rt shoulder 10 mins low compression 34 degrees   PATIENT EDUCATION: 01/18/2024 Education details: HEP update Person educated: Patient Education method: Explanation, Demonstration, Verbal cues, and Handouts Education comprehension: verbalized understanding, returned demonstration, and verbal cues required  HOME EXERCISE PROGRAM: Access Code: [email or web address] URL: [email or web address] Date: 01/18/2024 Prepared by: Chyrel Masson  Exercises - Supine Shoulder External Rotation in 45 Degrees Abduction AAROM with Dowel (Mirrored)  - 2-3 x daily - 7 x weekly - 2 sets - 10 reps - 2 seconds hold - Standing Shoulder Posterior Capsule Stretch (Mirrored)  - 2-3 x daily - 7 x weekly - 1 sets - 3 reps - 15-30 hold - Sleeper Stretch (Mirrored)  - 2-3 x daily - 7 x weekly - 1 sets - 3 reps - 30 hold - Standing Shoulder Row with Anchored Resistance  - 1 x daily - 7 x weekly - 1-2 sets - 10-15 reps - Shoulder Extension with Resistance  - 1 x daily - 7 x weekly - 1-2 sets - 10-15 reps - Shoulder External Rotation Reactive Isometrics (Mirrored)  - 1-2 x daily - 7 x weekly - 1 sets - 10 reps - 5-15 hold - Sidelying Shoulder Abduction Palm Forward  - 1-2 x daily - 7 x weekly - 2-3 sets - 10-15 reps - Sidelying Shoulder Flexion 15 Degrees (Mirrored)  - 1-2 x daily - 7 x weekly - 2-3 sets - 10-15 reps  ASSESSMENT:  CLINICAL IMPRESSION: The patient has attended 19 visits over the course of treatment cycle.  Patient has reported overall improvement at 70% with global rating of change at moderately better +4.  See objective data above for updated information regarding current presentation.   Pt reported still having some complaints of shoulder pain as well as functional reach/lift limits in daily activity.  At this time, recommended continued skilled PT services but reduced frequency to help develop HEP for long term management.    OBJECTIVE  IMPAIRMENTS: decreased ROM, decreased strength, increased edema, impaired UE functional use, postural dysfunction, and pain.   ACTIVITY LIMITATIONS: carrying, sleeping, bathing, toileting, dressing, reach over head, and hygiene/grooming  PARTICIPATION LIMITATIONS: meal prep, cleaning, laundry, driving, shopping, community activity, and occupation  PERSONAL FACTORS: 3+ comorbidities: see pertinent history  are also affecting patient's functional outcome.   REHAB POTENTIAL: Good  CLINICAL DECISION MAKING: Stable/uncomplicated  EVALUATION COMPLEXITY: Low   GOALS: Goals reviewed with patient? Yes  SHORT TERM GOALS: (target date for Short term goals are 3 weeks 11/24/23)  1.Patient will demonstrate independent use of home exercise program to maintain progress from in clinic treatments. Goal status: MET 11/14/23  LONG TERM GOALS: (target dates for all long term goals are 6 additional weeks  02/29/2024 )   1. Patient will demonstrate/report pain at worst less than or equal to 2/10 to facilitate minimal limitation in daily activity secondary to pain symptoms. Goal status: revised 01/18/2024   2. Patient will demonstrate independent use of home exercise program to  facilitate ability to maintain/progress functional gains from skilled physical therapy services. Goal status revised 01/18/2024   3. Patient will demonstrate FOTO outcome > or = 61 % to indicate reduced disability due to condition. Goal status:met 01/18/2024   4.  Patient will demonstrate Rt UE MMT >/= 4/5 throughout to facilitate lifting, reaching, carrying at [facility] in daily activity.   Goal status: revised 01/18/2024   5.  Patient will demonstrate Rt GH joint AROM WFL s symptoms to facilitate usual overhead reaching, self care, dressing at PLOF.    Goal status: revised 01/18/2024   6.  Pt will improve her Rt shoulder flexion to >/= 140 degrees for improvements in ADL's.  Goal status: revised 01/18/2024  7. Pt will improve her  Rt shoulder ER to >/= 60 degrees in order to improved functional actives.   Goal Status: revised 01/18/2024     PLAN:  PT FREQUENCY: 1x/week  PT DURATION: 6 weeks  PLANNED INTERVENTIONS: Can include [id number]- PT Re-evaluation, 97110-Therapeutic exercises, 97530- Therapeutic activity, 97112- Neuromuscular re-education, 97535- Self Care, 97140- Manual therapy, [phone]- Gait training, [phone]- Orthotic Fit/training, [phone]- Canalith repositioning, [id number]- Aquatic Therapy, 97014- Electrical stimulation (unattended), [id number]- Electrical stimulation (manual), [id number]- Vasopneumatic device, [id number]- Ultrasound, [id number]- Traction (mechanical), [id number]- Ionotophoresis 4mg /ml Dexamethasone, Patient/Family education, Balance training, Stair training, Taping, Dry Needling, Joint mobilization, Joint manipulation, Spinal manipulation, Spinal mobilization, Scar mobilization, Vestibular training, Visual/preceptual remediation/compensation, DME instructions, Cryotherapy, and Moist heat.  All performed as medically necessary.  All included unless contraindicated  PLAN FOR NEXT SESSION:  Progressive strengthening and end range mobiltiy gains as able.    Chyrel Masson, PT, DPT, OCS, ATC 01/18/24  9:28 AM

## 2024-01-29 ENCOUNTER — Encounter: Payer: Self-pay | Admitting: Rehabilitative and Restorative Service Providers"

## 2024-01-29 ENCOUNTER — Ambulatory Visit (INDEPENDENT_AMBULATORY_CARE_PROVIDER_SITE_OTHER): Payer: No Typology Code available for payment source | Admitting: Rehabilitative and Restorative Service Providers"

## 2024-01-29 DIAGNOSIS — R600 Localized edema: Secondary | ICD-10-CM | POA: Diagnosis not present

## 2024-01-29 DIAGNOSIS — M6281 Muscle weakness (generalized): Secondary | ICD-10-CM | POA: Diagnosis not present

## 2024-01-29 DIAGNOSIS — M25511 Pain in right shoulder: Secondary | ICD-10-CM | POA: Diagnosis not present

## 2024-01-29 NOTE — Therapy (Signed)
OUTPATIENT PHYSICAL THERAPY TREATMENT / PROGRESS NOTE/ RECERT   Patient Name: Misty Bailey MRN: [id number] DOB:[birth year], 53 y.o., female Today's Date: 01/29/2024   Progress Note Reporting Period 12/12/2023  to 01/18/2024  See note below for Objective Data and Assessment of Progress/Goals.    END OF SESSION:  PT End of Session - 01/29/24 1158     Visit Number 20    Number of Visits 24    Date for PT Re-Evaluation 02/29/24    Authorization Type [employer] $40 copay    Authorization - Number of Visits 58    Progress Note Due on Visit 20    PT Start Time 1141    PT Stop Time 1221    PT Time Calculation (min) 40 min    Activity Tolerance Patient tolerated treatment well    Behavior During Therapy [facility] for tasks assessed/performed                Past Medical History:  Diagnosis Date   Anemia    Anxiety    Arthritis 02/23/2014   Rib Cage   Asthma    Depression    Diverticulosis    Dysmenorrhea 05/08/2018   Essential hypertension 07/29/2019   HLD (hyperlipidemia)    Panic attacks    Pre-diabetes    Suppurative hidradenitis    Past Surgical History:  Procedure Laterality Date   CHOLECYSTECTOMY N/A 06/28/2023   Procedure: LAPAROSCOPIC CHOLECYSTECTOMY;  Surgeon: Abigail Miyamoto, MD;  Location: [facility] OR;  Service: General;  Laterality: N/A;  TAP BLOCK   INGUINAL HERNIA REPAIR Left 06/28/2023   Procedure: OPEN LEFT INGUINAL HERNIA REPAIR WITH MESH;  Surgeon: Abigail Miyamoto, MD;  Location: [facility] OR;  Service: General;  Laterality: Left;   oral surgery     PELVIC LAPAROSCOPY  1998/1999 `   pelvic adhesions and pain   TOE SURGERY Left    great toe   WISDOM TOOTH EXTRACTION     Patient Active Problem List   Diagnosis Date Noted   Adhesive capsulitis of right shoulder 11/28/2023   B12 deficiency 11/28/2023   Neuropathy 06/22/2023   Inguinal hernia of left side without obstruction or gangrene 04/30/2020   Hyperlipidemia 07/30/2019   Syncope 07/29/2019   Essential  hypertension 07/29/2019   Dysmenorrhea 05/08/2018   Herpes 03/21/2014   Suppurative hidradenitis    Depression     PCP: Marcine Matar, MD   REFERRING PROVIDER: Kathryne Hitch*   REFERRING DIAG:  Diagnosis  M24.611 (ICD-10-CM) - Arthrofibrosis of right shoulder  Z98.890 (ICD-10-CM) - Status post arthroscopy of right shoulder    THERAPY DIAG:  Acute pain of right shoulder  Muscle weakness (generalized)  Localized edema  Rationale for Evaluation and Treatment: Rehabilitation  ONSET DATE: February 2024  SUBJECTIVE:  SUBJECTIVE STATEMENT: Pt indicated not feeling well today (illness).  Pt indicated arm symptoms around 4/10.   PERTINENT HISTORY: Pt s/p on 10/12/23 manipulation c debridement of anterior capsule,  inferior capsule and subacrominal space  PMH: anemia, anxiety, asthma, depression, diverticulosis, essential HTN, panic attacks, pre-diabetes, hernia repair, oral surgery, toe surgery, pelvic laparoscopy, cholecystectomy   PAIN:  NPRS scale: 4/10 Pain location: Rt shoulder- anterior and lateral, some posterior shoulder  Pain description: achy, sore, sharp at times, constant Aggravating factors: lifting my arm, ADL's Relieving factors: resting, pain meds as needed  PRECAUTIONS: None  WEIGHT BEARING RESTRICTIONS: No  FALLS:  Has patient fallen in last 6 months? Yes. Number of falls pt stating no injuries, pt stating she was dehydrated.   LIVING ENVIRONMENT: Lives with: lives with their family and lives alone Lives in: House/apartment Stairs: No Has following equipment at home: None  OCCUPATION: Out of work since May  PLOF: Independent  PATIENT GOALS: be able reach overhead   OBJECTIVE:   DIAGNOSTIC FINDINGS:  10/31/2023 review of chart:  IMPRESSION: 1.  Mild tendinosis of the supraspinatus tendon anteriorly. 2. Mild subacromial/subdeltoid bursitis. 3. Mild relative thickening of the inferior joint capsule as can be seen with adhesive capsulitis.  PATIENT SURVEYS:  01/18/2024:  FOTO update 62  12/18/2023: FOTO update 46%  11/21/23: FOTO update 48%  10/31/23: FOTO intake:  41%      COGNITION: 10/31/2023 Overall cognitive status: WFL     SENSATION: 10/31/2023 Tingling in Rt fingers, pt feels that may be due to B-12 deficiency  POSTURE 10/31/2023 Forward head and rounded shoulders  UPPER EXTREMITY ROM:   ROM (* indicated pain) Right 10/31/23 Left eval Right  PROM 11/07/23 Rt 11/14/23 PROM supine Rt 11/21/23 PROM supine Rt 11/29/2023 AROM Rt 12/05/23 Rt 12/12/23 Rt 12/26/23 supine Rt 01/02/24 supine Rt 01/09/24 supine  Shoulder flexion 60 155 90* 110* 108 110 AA: 120 A: 124 A: 128 A: 125 A: 130  Shoulder extension 15 40           Shoulder abduction 65 162 80* 100*    A: 100 A: 110 A: 112 A: 115  Shoulder adduction             Shoulder internal rotation 50 75 60* 60 *         Shoulder external rotation 25 75 15-20* very guarded and pain limited  30 * 30   A: 50 A: 55 A: 54 A; 55  (Blank rows = not tested)  UPPER EXTREMITY MMT:  MMT Right 10/31/2023 Left 10/31/2023 Rt 12/18/23 Rt 01/09/24 Right 01/18/2024  Shoulder flexion 2 5 3  3+ 4/5  Shoulder extension 3 5 3+ 3+   Shoulder abduction 2 5 3  3+ 3+/5  Shoulder adduction       Shoulder internal rotation 2 5 3   5/5  Shoulder external rotation 2 5 3   4/5  (Blank rows = not tested)   PALPATION:  10/31/2023 TTP: Rt upper trap, Rt bicep tendons. Rt deltoid, Rt supraspinatus.  TODAY'S TREATMENT:                                                                                             DATE: 01/29/2024:  TherEx:  Sidelying Rt shoulder abduction 2 x 15 1 lb  Sidelying Rt shoulder ER 1 lb 2 x 15 c towel under arm  Additional time spent in verbal review of existing HEP.   Neuro Reed Stabilizations in 90 deg flexion c mild resistance 30 sec bouts  TherActivity Pulleys for functional reach flexion, scaption Rt arm with eccentric lowering focus 3 mins each way  Functional reach to overhead holding towel x 25 Rt  TODAY'S TREATMENT:                                                                                            DATE: 01/18/2024:  TherEx:  Pulleys flexion 3 mins, scaption 3 mins with outstretched arm and eccentric lowering focus Tband rows blue band bilaterally 2 x 15 Tband gh ext blue band bilaterally 2 x 15 Tband Green band Rt ER walk outs with arm at side with towel 5 sec hold x 12  Sidelying Rt shoulder abduction 1 lb 2 x 15 Sidelying Rt shoulder flexion 2 x 15       TODAY'S TREATMENT:                                                                                            DATE: 01/12/24: TherEx:  UBE: Level 3 x  4 minutes each direction, 1 minute rest break in between  Cross arm stretch x30sec throughout session   Side lying Rt shoulder abd x 20 Side lying Rt Shoulder ER x 20  Prone Y and T RUE only x10 ea; tc for reduction of compensatory strategies  Manual: Grade II-III GH A-P, inferior, and P-A glides in varied degrees of abduction for facilitation of movement Bicep soft tissue mobilization to tolerance for reduction of pain and increased mobility due to patient guarding  TODAY'S TREATMENT:  DATE: 01/09/24:  TherEx:  UBE: Level 3 x  4 minutes each direction, 1 minute rest break in between  Seated Cross arm stretch Door stretch bil UE's 60 deg holding 10 sec x 5  Side lying Rt shoulder abd x 20 Side lying Rt Shoulder ER x 20  Supine Rt shoulder  flexion 1# x 20  Manual:  Grade 2-3 GH mobs in flexion and inferior glides    TODAY'S TREATMENT:                                                                                            DATE: 01/04/2024:  TherEx:  UBE fwd/back 3 mins each way with 1 min rest between lvl  2.5 Seated cross arm stretch 15 sec x 3  Supine Rt shoulder flexion 1 lb x 10, 0 lbs x 20 Sidelying Rt shoulder ER c towel under arm 1 lb x 10, 0 lbs  Sidelying Rt shoulder abduction AROM x 10   Manual:  Rt shoulder GH joint inferior glides G2-g3 in flexion, scaption.  Mobilization c movement c ER passive and posterior glide.    Vaso Rt shoulder 10 mins low compression 34 degrees   PATIENT EDUCATION: 01/18/2024 Education details: HEP update Person educated: Patient Education method: Explanation, Demonstration, Verbal cues, and Handouts Education comprehension: verbalized understanding, returned demonstration, and verbal cues required  HOME EXERCISE PROGRAM: Access Code: [email or web address] URL: [email or web address] Date: 01/18/2024 Prepared by: Chyrel Masson  Exercises - Supine Shoulder External Rotation in 45 Degrees Abduction AAROM with Dowel (Mirrored)  - 2-3 x daily - 7 x weekly - 2 sets - 10 reps - 2 seconds hold - Standing Shoulder Posterior Capsule Stretch (Mirrored)  - 2-3 x daily - 7 x weekly - 1 sets - 3 reps - 15-30 hold - Sleeper Stretch (Mirrored)  - 2-3 x daily - 7 x weekly - 1 sets - 3 reps - 30 hold - Standing Shoulder Row with Anchored Resistance  - 1 x daily - 7 x weekly - 1-2 sets - 10-15 reps - Shoulder Extension with Resistance  - 1 x daily - 7 x weekly - 1-2 sets - 10-15 reps - Shoulder External Rotation Reactive Isometrics (Mirrored)  - 1-2 x daily - 7 x weekly - 1 sets - 10 reps - 5-15 hold - Sidelying Shoulder Abduction Palm Forward  - 1-2 x daily - 7 x weekly - 2-3 sets - 10-15 reps - Sidelying Shoulder Flexion 15 Degrees (Mirrored)  - 1-2 x daily - 7 x weekly - 2-3 sets - 10-15  reps  ASSESSMENT:  CLINICAL IMPRESSION: Functional reach continued to show improvement towards goals.  Pt to continue to beneift from progressive strengthening program and mobility gains to promote continued end range gains and daily activity tolerance.    OBJECTIVE IMPAIRMENTS: decreased ROM, decreased strength, increased edema, impaired UE functional use, postural dysfunction, and pain.   ACTIVITY LIMITATIONS: carrying, sleeping, bathing, toileting, dressing, reach over head, and hygiene/grooming  PARTICIPATION LIMITATIONS: meal prep, cleaning, laundry, driving, shopping, community activity, and occupation  PERSONAL FACTORS: 3+ comorbidities: see pertinent history  are also affecting patient's functional outcome.  REHAB POTENTIAL: Good  CLINICAL DECISION MAKING: Stable/uncomplicated  EVALUATION COMPLEXITY: Low   GOALS: Goals reviewed with patient? Yes  SHORT TERM GOALS: (target date for Short term goals are 3 weeks 11/24/23)  1.Patient will demonstrate independent use of home exercise program to maintain progress from in clinic treatments. Goal status: MET 11/14/23  LONG TERM GOALS: (target dates for all long term goals are 6 additional weeks  02/29/2024 )   1. Patient will demonstrate/report pain at worst less than or equal to 2/10 to facilitate minimal limitation in daily activity secondary to pain symptoms. Goal status: revised 01/18/2024   2. Patient will demonstrate independent use of home exercise program to facilitate ability to maintain/progress functional gains from skilled physical therapy services. Goal status revised 01/18/2024   3. Patient will demonstrate FOTO outcome > or = 61 % to indicate reduced disability due to condition. Goal status:met 01/18/2024   4.  Patient will demonstrate Rt UE MMT >/= 4/5 throughout to facilitate lifting, reaching, carrying at [facility] in daily activity.   Goal status: revised 01/18/2024   5.  Patient will demonstrate Rt GH joint  AROM WFL s symptoms to facilitate usual overhead reaching, self care, dressing at PLOF.    Goal status: revised 01/18/2024   6.  Pt will improve her Rt shoulder flexion to >/= 140 degrees for improvements in ADL's.  Goal status: revised 01/18/2024  7. Pt will improve her Rt shoulder ER to >/= 60 degrees in order to improved functional actives.   Goal Status: revised 01/18/2024     PLAN:  PT FREQUENCY: 1x/week  PT DURATION: 6 weeks  PLANNED INTERVENTIONS: Can include 16109- PT Re-evaluation, 97110-Therapeutic exercises, 97530- Therapeutic activity, 97112- Neuromuscular re-education, 97535- Self Care, 97140- Manual therapy, [phone]- Gait training, [phone]- Orthotic Fit/training, [phone]- Canalith repositioning, [id number]- Aquatic Therapy, 97014- Electrical stimulation (unattended), [id number]- Electrical stimulation (manual), [id number]- Vasopneumatic device, [id number]- Ultrasound, [id number]- Traction (mechanical), [id number]- Ionotophoresis 4mg /ml Dexamethasone, Patient/Family education, Balance training, Stair training, Taping, Dry Needling, Joint mobilization, Joint manipulation, Spinal manipulation, Spinal mobilization, Scar mobilization, Vestibular training, Visual/preceptual remediation/compensation, DME instructions, Cryotherapy, and Moist heat.  All performed as medically necessary.  All included unless contraindicated  PLAN FOR NEXT SESSION:  Strengthening   Chyrel Masson, PT, DPT, OCS, ATC 01/29/24  12:26 PM

## 2024-02-07 ENCOUNTER — Encounter: Payer: Self-pay | Admitting: Rehabilitative and Restorative Service Providers"

## 2024-02-07 ENCOUNTER — Ambulatory Visit (INDEPENDENT_AMBULATORY_CARE_PROVIDER_SITE_OTHER): Payer: No Typology Code available for payment source | Admitting: Rehabilitative and Restorative Service Providers"

## 2024-02-07 DIAGNOSIS — R600 Localized edema: Secondary | ICD-10-CM

## 2024-02-07 DIAGNOSIS — M6281 Muscle weakness (generalized): Secondary | ICD-10-CM | POA: Diagnosis not present

## 2024-02-07 DIAGNOSIS — M25511 Pain in right shoulder: Secondary | ICD-10-CM | POA: Diagnosis not present

## 2024-02-07 NOTE — Therapy (Signed)
OUTPATIENT PHYSICAL THERAPY TREATMENT  Patient Name: Misty Bailey MRN: [id number] DOB:[birth year], 53 y.o., female Today's Date: 02/07/2024   END OF SESSION:  PT End of Session - 02/07/24 0850     Visit Number 21    Number of Visits 24    Date for PT Re-Evaluation 02/29/24    Authorization Type [employer] $40 copay    Authorization - Number of Visits 58    Progress Note Due on Visit 20    PT Start Time 0845    PT Stop Time 0925    PT Time Calculation (min) 40 min    Activity Tolerance Patient tolerated treatment well;Patient limited by pain    Behavior During Therapy [facility] for tasks assessed/performed                 Past Medical History:  Diagnosis Date   Anemia    Anxiety    Arthritis 02/23/2014   Rib Cage   Asthma    Depression    Diverticulosis    Dysmenorrhea 05/08/2018   Essential hypertension 07/29/2019   HLD (hyperlipidemia)    Panic attacks    Pre-diabetes    Suppurative hidradenitis    Past Surgical History:  Procedure Laterality Date   CHOLECYSTECTOMY N/A 06/28/2023   Procedure: LAPAROSCOPIC CHOLECYSTECTOMY;  Surgeon: Abigail Miyamoto, MD;  Location: [facility] OR;  Service: General;  Laterality: N/A;  TAP BLOCK   INGUINAL HERNIA REPAIR Left 06/28/2023   Procedure: OPEN LEFT INGUINAL HERNIA REPAIR WITH MESH;  Surgeon: Abigail Miyamoto, MD;  Location[facility] OR;  Service: General;  Laterality: Left;   oral surgery     PELVIC LAPAROSCOPY  1998/1999 `   pelvic adhesions and pain   TOE SURGERY Left    great toe   WISDOM TOOTH EXTRACTION     Patient Active Problem List   Diagnosis Date Noted   Adhesive capsulitis of right shoulder 11/28/2023   B12 deficiency 11/28/2023   Neuropathy 06/22/2023   Inguinal hernia of left side without obstruction or gangrene 04/30/2020   Hyperlipidemia 07/30/2019   Syncope 07/29/2019   Essential hypertension 07/29/2019   Dysmenorrhea 05/08/2018   Herpes 03/21/2014   Suppurative hidradenitis    Depression     PCP: Marcine Matar, MD   REFERRING PROVIDER: Kathryne Hitch*   REFERRING DIAG:  Diagnosis  M24.611 (ICD-10-CM) - Arthrofibrosis of right shoulder  Z98.890 (ICD-10-CM) - Status post arthroscopy of right shoulder    THERAPY DIAG:  Acute pain of right shoulder  Muscle weakness (generalized)  Localized edema  Rationale for Evaluation and Treatment: Rehabilitation  ONSET DATE: February 2024  SUBJECTIVE:  SUBJECTIVE STATEMENT: Having increased pain today and this week due to flare up of HS (Hidradenitis suppurativa). Has not been able to do much of the HEP or movement due to increased pain at armpit.  PERTINENT HISTORY: Pt s/p on 10/12/23 manipulation c debridement of anterior capsule,  inferior capsule and subacrominal space  PMH: anemia, anxiety, asthma, depression, diverticulosis, essential HTN, panic attacks, pre-diabetes, hernia repair, oral surgery, toe surgery, pelvic laparoscopy, cholecystectomy   PAIN:  NPRS scale: 8/10 due to flare up of other autoimmune  Pain location: Rt shoulder- anterior and lateral, some posterior shoulder  Pain description: achy, sore, sharp at times, constant Aggravating factors: lifting my arm, ADL's Relieving factors: resting, pain meds as needed  PRECAUTIONS: None  WEIGHT BEARING RESTRICTIONS: No  FALLS:  Has patient fallen in last 6 months? Yes. Number of falls pt stating no injuries, pt stating she was dehydrated.   LIVING ENVIRONMENT: Lives with: lives with their family and lives alone Lives in: House/apartment Stairs: No Has following equipment at home: None  OCCUPATION: Out of work since May  PLOF: Independent  PATIENT GOALS: be able reach overhead   OBJECTIVE:   DIAGNOSTIC FINDINGS:  10/31/2023 review of chart:  IMPRESSION: 1. Mild  tendinosis of the supraspinatus tendon anteriorly. 2. Mild subacromial/subdeltoid bursitis. 3. Mild relative thickening of the inferior joint capsule as can be seen with adhesive capsulitis.  PATIENT SURVEYS:  01/18/2024:  FOTO update 62  12/18/2023: FOTO update 46%  11/21/23: FOTO update 48%  10/31/23: FOTO intake:  41%      COGNITION: 10/31/2023 Overall cognitive status: WFL     SENSATION: 10/31/2023 Tingling in Rt fingers, pt feels that may be due to B-12 deficiency  POSTURE 10/31/2023 Forward head and rounded shoulders  UPPER EXTREMITY ROM:   ROM (* indicated pain) Right 10/31/23 Left eval Right  PROM 11/07/23 Rt 11/14/23 PROM supine Rt 11/21/23 PROM supine Rt 11/29/2023 AROM Rt 12/05/23 Rt 12/12/23 Rt 12/26/23 supine Rt 01/02/24 supine Rt 01/09/24 supine  Shoulder flexion 60 155 90* 110* 108 110 AA: 120 A: 124 A: 128 A: 125 A: 130  Shoulder extension 15 40           Shoulder abduction 65 162 80* 100*    A: 100 A: 110 A: 112 A: 115  Shoulder adduction             Shoulder internal rotation 50 75 60* 60 *         Shoulder external rotation 25 75 15-20* very guarded and pain limited  30 * 30   A: 50 A: 55 A: 54 A; 55  (Blank rows = not tested)  UPPER EXTREMITY MMT:  MMT Right 10/31/2023 Left 10/31/2023 Rt 12/18/23 Rt 01/09/24 Right 01/18/2024  Shoulder flexion 2 5 3  3+ 4/5  Shoulder extension 3 5 3+ 3+   Shoulder abduction 2 5 3  3+ 3+/5  Shoulder adduction       Shoulder internal rotation 2 5 3   5/5  Shoulder external rotation 2 5 3   4/5  (Blank rows = not tested)   PALPATION:  10/31/2023 TTP: Rt upper trap, Rt bicep tendons. Rt deltoid, Rt supraspinatus.  TODAY'S TREATMENT:                                                                                             DATE: 02/07/2024:  Theract: Pulleys for functional reach flexion, scaption Rt arm with eccentric lowering focus 3 mins each way, rest required between bouts  TherEx:  Green Theraband row/extension 2x15 ea; vc for proper form and reduction of shoulder shrug RUE green tband ER walkout 2x10 Standing abduction to 90deg 2x8 with mirror for visual feedback   Due to increased pain and patient voicing more comfort with standing, supine activity was not done today.   TODAY'S TREATMENT:                                                                                            DATE: 01/29/2024:  TherEx:  Sidelying Rt shoulder abduction 2 x 15 1 lb  Sidelying Rt shoulder ER 1 lb 2 x 15 c towel under arm  Additional time spent in verbal review of existing HEP.   Neuro Reed Stabilizations in 90 deg flexion c mild resistance 30 sec bouts  TherActivity Pulleys for functional reach flexion, scaption Rt arm with eccentric lowering focus 3 mins each way  Functional reach to overhead holding towel x 25 Rt  TODAY'S TREATMENT:                                                                                            DATE: 01/18/2024:  TherEx:  Pulleys flexion 3 mins, scaption 3 mins with outstretched arm and eccentric lowering focus Tband rows blue band bilaterally 2 x 15 Tband gh ext blue band bilaterally 2 x 15 Tband Green band Rt ER walk outs with arm at side with towel 5 sec hold x 12  Sidelying Rt shoulder abduction 1 lb 2 x 15 Sidelying Rt shoulder flexion 2 x 15       TODAY'S TREATMENT:  DATE: 01/12/24: TherEx:  UBE: Level 3 x  4 minutes each direction, 1 minute rest break in between  Cross arm stretch x30sec throughout session   Side lying Rt shoulder abd x 20 Side lying Rt Shoulder ER x 20  Prone Y and T RUE only x10 ea; tc for reduction of compensatory strategies  Manual: Grade II-III GH A-P,  inferior, and P-A glides in varied degrees of abduction for facilitation of movement Bicep soft tissue mobilization to tolerance for reduction of pain and increased mobility due to patient guarding  TODAY'S TREATMENT:                                                                                            DATE: 01/09/24:  TherEx:  UBE: Level 3 x  4 minutes each direction, 1 minute rest break in between  Seated Cross arm stretch Door stretch bil UE's 60 deg holding 10 sec x 5  Side lying Rt shoulder abd x 20 Side lying Rt Shoulder ER x 20  Supine Rt shoulder flexion 1# x 20  Manual:  Grade 2-3 GH mobs in flexion and inferior glides    TODAY'S TREATMENT:                                                                                            DATE: 01/04/2024:  TherEx:  UBE fwd/back 3 mins each way with 1 min rest between lvl  2.5 Seated cross arm stretch 15 sec x 3  Supine Rt shoulder flexion 1 lb x 10, 0 lbs x 20 Sidelying Rt shoulder ER c towel under arm 1 lb x 10, 0 lbs  Sidelying Rt shoulder abduction AROM x 10   Manual:  Rt shoulder GH joint inferior glides G2-g3 in flexion, scaption.  Mobilization c movement c ER passive and posterior glide.    Vaso Rt shoulder 10 mins low compression 34 degrees   PATIENT EDUCATION: 01/18/2024 Education details: HEP update Person educated: Patient Education method: Explanation, Demonstration, Verbal cues, and Handouts Education comprehension: verbalized understanding, returned demonstration, and verbal cues required  HOME EXERCISE PROGRAM: Access Code: [email or web address] URL: [email or web address] Date: 01/18/2024 Prepared by: Chyrel Masson  Exercises - Supine Shoulder External Rotation in 45 Degrees Abduction AAROM with Dowel (Mirrored)  - 2-3 x daily - 7 x weekly - 2 sets - 10 reps - 2 seconds hold - Standing Shoulder Posterior Capsule Stretch (Mirrored)  - 2-3 x daily - 7 x weekly - 1 sets - 3 reps - 15-30 hold - Sleeper  Stretch (Mirrored)  - 2-3 x daily - 7 x weekly - 1 sets - 3 reps - 30 hold - Standing Shoulder Row with Anchored Resistance  - 1 x daily - 7 x weekly - 1-2 sets -  10-15 reps - Shoulder Extension with Resistance  - 1 x daily - 7 x weekly - 1-2 sets - 10-15 reps - Shoulder External Rotation Reactive Isometrics (Mirrored)  - 1-2 x daily - 7 x weekly - 1 sets - 10 reps - 5-15 hold - Sidelying Shoulder Abduction Palm Forward  - 1-2 x daily - 7 x weekly - 2-3 sets - 10-15 reps - Sidelying Shoulder Flexion 15 Degrees (Mirrored)  - 1-2 x daily - 7 x weekly - 2-3 sets - 10-15 reps  ASSESSMENT:  CLINICAL IMPRESSION: Despite patients increased pain from HS flare-up, she was able to tolerate standing activity without compensatory strategies. Required increased rest break between activity due to pain caused by flare-up. Patient will benefit from continued strengthening as well as stretching as tolerated for increased function.    OBJECTIVE IMPAIRMENTS: decreased ROM, decreased strength, increased edema, impaired UE functional use, postural dysfunction, and pain.   ACTIVITY LIMITATIONS: carrying, sleeping, bathing, toileting, dressing, reach over head, and hygiene/grooming  PARTICIPATION LIMITATIONS: meal prep, cleaning, laundry, driving, shopping, community activity, and occupation  PERSONAL FACTORS: 3+ comorbidities: see pertinent history  are also affecting patient's functional outcome.   REHAB POTENTIAL: Good  CLINICAL DECISION MAKING: Stable/uncomplicated  EVALUATION COMPLEXITY: Low   GOALS: Goals reviewed with patient? Yes  SHORT TERM GOALS: (target date for Short term goals are 3 weeks 11/24/23)  1.Patient will demonstrate independent use of home exercise program to maintain progress from in clinic treatments. Goal status: MET 11/14/23  LONG TERM GOALS: (target dates for all long term goals are 6 additional weeks  02/29/2024 )   1. Patient will demonstrate/report pain at worst less than  or equal to 2/10 to facilitate minimal limitation in daily activity secondary to pain symptoms. Goal status: revised 01/18/2024   2. Patient will demonstrate independent use of home exercise program to facilitate ability to maintain/progress functional gains from skilled physical therapy services. Goal status revised 01/18/2024   3. Patient will demonstrate FOTO outcome > or = 61 % to indicate reduced disability due to condition. Goal status:met 01/18/2024   4.  Patient will demonstrate Rt UE MMT >/= 4/5 throughout to facilitate lifting, reaching, carrying at [facility] in daily activity.   Goal status: revised 01/18/2024   5.  Patient will demonstrate Rt GH joint AROM WFL s symptoms to facilitate usual overhead reaching, self care, dressing at PLOF.    Goal status: revised 01/18/2024   6.  Pt will improve her Rt shoulder flexion to >/= 140 degrees for improvements in ADL's.  Goal status: revised 01/18/2024  7. Pt will improve her Rt shoulder ER to >/= 60 degrees in order to improved functional actives.   Goal Status: revised 01/18/2024     PLAN:  PT FREQUENCY: 1x/week  PT DURATION: 6 weeks  PLANNED INTERVENTIONS: Can include 81191- PT Re-evaluation, 97110-Therapeutic exercises, 97530- Therapeutic activity, 97112- Neuromuscular re-education, 97535- Self Care, 97140- Manual therapy, [phone]- Gait training, [phone]- Orthotic Fit/training, [phone]- Canalith repositioning, [id number]- Aquatic Therapy, 97014- Electrical stimulation (unattended), [id number]- Electrical stimulation (manual), [id number]- Vasopneumatic device, [id number]- Ultrasound, [id number]- Traction (mechanical), [id number]- Ionotophoresis 4mg /ml Dexamethasone, Patient/Family education, Balance training, Stair training, Taping, Dry Needling, Joint mobilization, Joint manipulation, Spinal manipulation, Spinal mobilization, Scar mobilization, Vestibular training, Visual/preceptual remediation/compensation, DME instructions, Cryotherapy, and Moist heat.  All performed as  medically necessary.  All included unless contraindicated  PLAN FOR NEXT SESSION:  continue strengthening as tolerated   Oletta Buehring, Sunshyne Horvath, Student-PT 02/07/2024, 9:38 AM

## 2024-02-12 ENCOUNTER — Encounter: Payer: No Typology Code available for payment source | Admitting: Rehabilitative and Restorative Service Providers"

## 2024-02-12 NOTE — Therapy (Incomplete)
OUTPATIENT PHYSICAL THERAPY TREATMENT  Patient Name: Misty Bailey MRN: [id number] DOB:Jun 26, 1971, 53 y.o., female Today's Date: 02/12/2024   END OF SESSION:        Past Medical History:  Diagnosis Date   Anemia    Anxiety    Arthritis 02/23/2014   Rib Cage   Asthma    Depression    Diverticulosis    Dysmenorrhea 05/08/2018   Essential hypertension 07/29/2019   HLD (hyperlipidemia)    Panic attacks    Pre-diabetes    Suppurative hidradenitis    Past Surgical History:  Procedure Laterality Date   CHOLECYSTECTOMY N/A 06/28/2023   Procedure: LAPAROSCOPIC CHOLECYSTECTOMY;  Surgeon: Abigail Miyamoto, MD;  Location: [facility] OR;  Service: General;  Laterality: N/A;  TAP BLOCK   INGUINAL HERNIA REPAIR Left 06/28/2023   Procedure: OPEN LEFT INGUINAL HERNIA REPAIR WITH MESH;  Surgeon: Abigail Miyamoto, MD;  Location: [facility] OR;  Service: General;  Laterality: Left;   oral surgery     PELVIC LAPAROSCOPY  1998/1999 `   pelvic adhesions and pain   TOE SURGERY Left    great toe   WISDOM TOOTH EXTRACTION     Patient Active Problem List   Diagnosis Date Noted   Adhesive capsulitis of right shoulder 11/28/2023   B12 deficiency 11/28/2023   Neuropathy 06/22/2023   Inguinal hernia of left side without obstruction or gangrene 04/30/2020   Hyperlipidemia 07/30/2019   Syncope 07/29/2019   Essential hypertension 07/29/2019   Dysmenorrhea 05/08/2018   Herpes 03/21/2014   Suppurative hidradenitis    Depression     PCP: Marcine Matar, MD   REFERRING PROVIDER: Kathryne Hitch*   REFERRING DIAG:  Diagnosis  M24.611 (ICD-10-CM) - Arthrofibrosis of right shoulder  Z98.890 (ICD-10-CM) - Status post arthroscopy of right shoulder    THERAPY DIAG:  No diagnosis found.  Rationale for Evaluation and Treatment: Rehabilitation  ONSET DATE: February 2024  SUBJECTIVE:                                                                                                                                                                                       SUBJECTIVE STATEMENT: Having increased pain today and this week due to flare up of HS (Hidradenitis suppurativa). Has not been able to do much of the HEP or movement due to increased pain at armpit.  PERTINENT HISTORY: Pt s/p on 10/12/23 manipulation c debridement of anterior capsule,  inferior capsule and subacrominal space  PMH: anemia, anxiety, asthma, depression, diverticulosis, essential HTN, panic attacks, pre-diabetes, hernia repair, oral surgery, toe surgery, pelvic laparoscopy, cholecystectomy   PAIN:  NPRS scale: 8/10 due to flare up of other  autoimmune  Pain location: Rt shoulder- anterior and lateral, some posterior shoulder  Pain description: achy, sore, sharp at times, constant Aggravating factors: lifting my arm, ADL's Relieving factors: resting, pain meds as needed  PRECAUTIONS: None  WEIGHT BEARING RESTRICTIONS: No  FALLS:  Has patient fallen in last 6 months? Yes. Number of falls pt stating no injuries, pt stating she was dehydrated.   LIVING ENVIRONMENT: Lives with: lives with their family and lives alone Lives in: House/apartment Stairs: No Has following equipment at home: None  OCCUPATION: Out of work since May  PLOF: Independent  PATIENT GOALS: be able reach overhead   OBJECTIVE:   DIAGNOSTIC FINDINGS:  10/31/2023 review of chart:  IMPRESSION: 1. Mild tendinosis of the supraspinatus tendon anteriorly. 2. Mild subacromial/subdeltoid bursitis. 3. Mild relative thickening of the inferior joint capsule as can be seen with adhesive capsulitis.  PATIENT SURVEYS:  01/18/2024:  FOTO update 62  12/18/2023: FOTO update 46%  11/21/23: FOTO update 48%  10/31/23: FOTO intake:  41%      COGNITION: 10/31/2023 Overall cognitive status: WFL     SENSATION: 10/31/2023 Tingling in Rt fingers, pt feels that may be due to B-12 deficiency  POSTURE 10/31/2023 Forward head and rounded  shoulders  UPPER EXTREMITY ROM:   ROM (* indicated pain) Right 10/31/23 Left eval Right  PROM 11/07/23 Rt 11/14/23 PROM supine Rt 11/21/23 PROM supine Rt 11/29/2023 AROM Rt 12/05/23 Rt 12/12/23 Rt 12/26/23 supine Rt 01/02/24 supine Rt 01/09/24 supine Right 02/12/2024  Shoulder flexion 60 155 90* 110* 108 110 AA: 120 A: 124 A: 128 A: 125 A: 130   Shoulder extension 15 40            Shoulder abduction 65 162 80* 100*    A: 100 A: 110 A: 112 A: 115   Shoulder adduction              Shoulder internal rotation 50 75 60* 60 *          Shoulder external rotation 25 75 15-20* very guarded and pain limited  30 * 30   A: 50 A: 55 A: 54 A; 55   (Blank rows = not tested)  UPPER EXTREMITY MMT:  MMT Right 10/31/2023 Left 10/31/2023 Rt 12/18/23 Rt 01/09/24 Right 01/18/2024  Shoulder flexion 2 5 3  3+ 4/5  Shoulder extension 3 5 3+ 3+   Shoulder abduction 2 5 3  3+ 3+/5  Shoulder adduction       Shoulder internal rotation 2 5 3   5/5  Shoulder external rotation 2 5 3   4/5  (Blank rows = not tested)   PALPATION:  10/31/2023 TTP: Rt upper trap, Rt bicep tendons. Rt deltoid, Rt supraspinatus.  TODAY'S TREATMENT:                                                                                            DATE: 02/12/2024:  Theract: Pulleys for functional reach flexion, scaption Rt arm with eccentric lowering focus 3 mins each way, rest required between bouts  TherEx:    TODAY'S TREATMENT:                                                                                            DATE: 02/07/2024:  Theract: Pulleys for functional reach flexion, scaption Rt arm with eccentric lowering focus 3 mins each way, rest required between bouts  TherEx:  Green Theraband row/extension 2x15 ea; vc for proper form  and reduction of shoulder shrug RUE green tband ER walkout 2x10 Standing abduction to 90deg 2x8 with mirror for visual feedback   Due to increased pain and patient voicing more comfort with standing, supine activity was not done today.   TODAY'S TREATMENT:                                                                                            DATE: 01/29/2024:  TherEx:  Sidelying Rt shoulder abduction 2 x 15 1 lb  Sidelying Rt shoulder ER 1 lb 2 x 15 c towel under arm  Additional time spent in verbal review of existing HEP.   Neuro Reed Stabilizations in 90 deg flexion c mild resistance 30 sec bouts  TherActivity Pulleys for functional reach flexion, scaption Rt arm with eccentric lowering focus 3 mins each way  Functional reach to overhead holding towel x 25 Rt  TODAY'S TREATMENT:                                                                                            DATE: 01/18/2024:  TherEx:  Pulleys flexion 3 mins, scaption 3 mins with outstretched arm and eccentric lowering focus Tband rows blue band bilaterally 2 x 15 Tband gh ext blue band bilaterally 2 x 15 Tband Green  band Rt ER walk outs with arm at side with towel 5 sec hold x 12  Sidelying Rt shoulder abduction 1 lb 2 x 15 Sidelying Rt shoulder flexion 2 x 15       TODAY'S TREATMENT:                                                                                            DATE: 01/12/24: TherEx:  UBE: Level 3 x  4 minutes each direction, 1 minute rest break in between  Cross arm stretch x30sec throughout session   Side lying Rt shoulder abd x 20 Side lying Rt Shoulder ER x 20  Prone Y and T RUE only x10 ea; tc for reduction of compensatory strategies  Manual: Grade II-III GH A-P, inferior, and P-A glides in varied degrees of abduction for facilitation of movement Bicep soft tissue mobilization to tolerance for reduction of pain and increased mobility due to patient guarding   PATIENT  EDUCATION: 01/18/2024 Education details: HEP update Person educated: Patient Education method: Programmer, multimedia, Demonstration, Verbal cues, and Handouts Education comprehension: verbalized understanding, returned demonstration, and verbal cues required  HOME EXERCISE PROGRAM: Access Code: [email or web address] URL: [email or web address] Date: 01/18/2024 Prepared by: Chyrel Masson  Exercises - Supine Shoulder External Rotation in 45 Degrees Abduction AAROM with Dowel (Mirrored)  - 2-3 x daily - 7 x weekly - 2 sets - 10 reps - 2 seconds hold - Standing Shoulder Posterior Capsule Stretch (Mirrored)  - 2-3 x daily - 7 x weekly - 1 sets - 3 reps - 15-30 hold - Sleeper Stretch (Mirrored)  - 2-3 x daily - 7 x weekly - 1 sets - 3 reps - 30 hold - Standing Shoulder Row with Anchored Resistance  - 1 x daily - 7 x weekly - 1-2 sets - 10-15 reps - Shoulder Extension with Resistance  - 1 x daily - 7 x weekly - 1-2 sets - 10-15 reps - Shoulder External Rotation Reactive Isometrics (Mirrored)  - 1-2 x daily - 7 x weekly - 1 sets - 10 reps - 5-15 hold - Sidelying Shoulder Abduction Palm Forward  - 1-2 x daily - 7 x weekly - 2-3 sets - 10-15 reps - Sidelying Shoulder Flexion 15 Degrees (Mirrored)  - 1-2 x daily - 7 x weekly - 2-3 sets - 10-15 reps  ASSESSMENT:  CLINICAL IMPRESSION: Despite patients increased pain from HS flare-up, she was able to tolerate standing activity without compensatory strategies. Required increased rest break between activity due to pain caused by flare-up. Patient will benefit from continued strengthening as well as stretching as tolerated for increased function.    OBJECTIVE IMPAIRMENTS: decreased ROM, decreased strength, increased edema, impaired UE functional use, postural dysfunction, and pain.   ACTIVITY LIMITATIONS: carrying, sleeping, bathing, toileting, dressing, reach over head, and hygiene/grooming  PARTICIPATION LIMITATIONS: meal prep, cleaning, laundry, driving,  shopping, community activity, and occupation  PERSONAL FACTORS: 3+ comorbidities: see pertinent history  are also affecting patient's functional outcome.   REHAB POTENTIAL: Good  CLINICAL DECISION MAKING: Stable/uncomplicated  EVALUATION COMPLEXITY: Low   GOALS: Goals reviewed with patient? Yes  SHORT TERM GOALS: (target  date for Short term goals are 3 weeks 11/24/23)  1.Patient will demonstrate independent use of home exercise program to maintain progress from in clinic treatments. Goal status: MET 11/14/23  LONG TERM GOALS: (target dates for all long term goals are 6 additional weeks  02/29/2024 )   1. Patient will demonstrate/report pain at worst less than or equal to 2/10 to facilitate minimal limitation in daily activity secondary to pain symptoms. Goal status: revised 01/18/2024   2. Patient will demonstrate independent use of home exercise program to facilitate ability to maintain/progress functional gains from skilled physical therapy services. Goal status revised 01/18/2024   3. Patient will demonstrate FOTO outcome > or = 61 % to indicate reduced disability due to condition. Goal status:met 01/18/2024   4.  Patient will demonstrate Rt UE MMT >/= 4/5 throughout to facilitate lifting, reaching, carrying at [facility] in daily activity.   Goal status: revised 01/18/2024   5.  Patient will demonstrate Rt GH joint AROM WFL s symptoms to facilitate usual overhead reaching, self care, dressing at PLOF.    Goal status: revised 01/18/2024   6.  Pt will improve her Rt shoulder flexion to >/= 140 degrees for improvements in ADL's.  Goal status: revised 01/18/2024  7. Pt will improve her Rt shoulder ER to >/= 60 degrees in order to improved functional actives.   Goal Status: revised 01/18/2024     PLAN:  PT FREQUENCY: 1x/week  PT DURATION: 6 weeks  PLANNED INTERVENTIONS: Can include 40981- PT Re-evaluation, 97110-Therapeutic exercises, 97530- Therapeutic activity, 97112-  Neuromuscular re-education, 97535- Self Care, 97140- Manual therapy, [phone]- Gait training, [phone]- Orthotic Fit/training, [phone]- Canalith repositioning, [id number]- Aquatic Therapy, 97014- Electrical stimulation (unattended), [id number]- Electrical stimulation (manual), [id number]- Vasopneumatic device, [id number]- Ultrasound, [id number]- Traction (mechanical), [id number]- Ionotophoresis 4mg /ml Dexamethasone, Patient/Family education, Balance training, Stair training, Taping, Dry Needling, Joint mobilization, Joint manipulation, Spinal manipulation, Spinal mobilization, Scar mobilization, Vestibular training, Visual/preceptual remediation/compensation, DME instructions, Cryotherapy, and Moist heat.  All performed as medically necessary.  All included unless contraindicated  PLAN FOR NEXT SESSION:  continue strengthening as tolerated   Chyrel Masson, PT, DPT, OCS, ATC 02/12/24  7:54 AM

## 2024-02-19 ENCOUNTER — Encounter: Payer: No Typology Code available for payment source | Admitting: Rehabilitative and Restorative Service Providers"

## 2024-02-19 NOTE — Therapy (Incomplete)
OUTPATIENT PHYSICAL THERAPY TREATMENT  Patient Name: Misty Bailey MRN: [id number] DOB:1971/04/21, 53 y.o., female Today's Date: 02/19/2024   END OF SESSION:        Past Medical History:  Diagnosis Date   Anemia    Anxiety    Arthritis 02/23/2014   Rib Cage   Asthma    Depression    Diverticulosis    Dysmenorrhea 05/08/2018   Essential hypertension 07/29/2019   HLD (hyperlipidemia)    Panic attacks    Pre-diabetes    Suppurative hidradenitis    Past Surgical History:  Procedure Laterality Date   CHOLECYSTECTOMY N/A 06/28/2023   Procedure: LAPAROSCOPIC CHOLECYSTECTOMY;  Surgeon: Abigail Miyamoto, MD;  Location: [facility] OR;  Service: General;  Laterality: N/A;  TAP BLOCK   INGUINAL HERNIA REPAIR Left 06/28/2023   Procedure: OPEN LEFT INGUINAL HERNIA REPAIR WITH MESH;  Surgeon: Abigail Miyamoto, MD;  Location: [facility] OR;  Service: General;  Laterality: Left;   oral surgery     PELVIC LAPAROSCOPY  1998/1999 `   pelvic adhesions and pain   TOE SURGERY Left    great toe   WISDOM TOOTH EXTRACTION     Patient Active Problem List   Diagnosis Date Noted   Adhesive capsulitis of right shoulder 11/28/2023   B12 deficiency 11/28/2023   Neuropathy 06/22/2023   Inguinal hernia of left side without obstruction or gangrene 04/30/2020   Hyperlipidemia 07/30/2019   Syncope 07/29/2019   Essential hypertension 07/29/2019   Dysmenorrhea 05/08/2018   Herpes 03/21/2014   Suppurative hidradenitis    Depression     PCP: Marcine Matar, MD   REFERRING PROVIDER: Marcine Matar, MD   REFERRING DIAG:  Diagnosis  M24.611 (ICD-10-CM) - Arthrofibrosis of right shoulder  Z98.890 (ICD-10-CM) - Status post arthroscopy of right shoulder    THERAPY DIAG:  No diagnosis found.  Rationale for Evaluation and Treatment: Rehabilitation  ONSET DATE: February 2024  SUBJECTIVE:                                                                                                                                                                                       SUBJECTIVE STATEMENT: Having increased pain today and this week due to flare up of HS (Hidradenitis suppurativa). Has not been able to do much of the HEP or movement due to increased pain at armpit.  PERTINENT HISTORY: Pt s/p on 10/12/23 manipulation c debridement of anterior capsule,  inferior capsule and subacrominal space  PMH: anemia, anxiety, asthma, depression, diverticulosis, essential HTN, panic attacks, pre-diabetes, hernia repair, oral surgery, toe surgery, pelvic laparoscopy, cholecystectomy   PAIN:  NPRS scale: 8/10 due to flare up of  other autoimmune  Pain location: Rt shoulder- anterior and lateral, some posterior shoulder  Pain description: achy, sore, sharp at times, constant Aggravating factors: lifting my arm, ADL's Relieving factors: resting, pain meds as needed  PRECAUTIONS: None  WEIGHT BEARING RESTRICTIONS: No  FALLS:  Has patient fallen in last 6 months? Yes. Number of falls pt stating no injuries, pt stating she was dehydrated.   LIVING ENVIRONMENT: Lives with: lives with their family and lives alone Lives in: House/apartment Stairs: No Has following equipment at home: None  OCCUPATION: Out of work since May  PLOF: Independent  PATIENT GOALS: be able reach overhead   OBJECTIVE:   DIAGNOSTIC FINDINGS:  10/31/2023 review of chart:  IMPRESSION: 1. Mild tendinosis of the supraspinatus tendon anteriorly. 2. Mild subacromial/subdeltoid bursitis. 3. Mild relative thickening of the inferior joint capsule as can be seen with adhesive capsulitis.  PATIENT SURVEYS:  01/18/2024:  FOTO update 62  12/18/2023: FOTO update 46%  11/21/23: FOTO update 48%  10/31/23: FOTO intake:  41%      COGNITION: 10/31/2023 Overall cognitive status: WFL     SENSATION: 10/31/2023 Tingling in Rt fingers, pt feels that may be due to B-12 deficiency  POSTURE 10/31/2023 Forward head and rounded  shoulders  UPPER EXTREMITY ROM:   ROM (* indicated pain) Right 10/31/23 Left eval Right  PROM 11/07/23 Rt 11/14/23 PROM supine Rt 11/21/23 PROM supine Rt 11/29/2023 AROM Rt 12/05/23 Rt 12/12/23 Rt 12/26/23 supine Rt 01/02/24 supine Rt 01/09/24 supine Right 02/12/2024  Shoulder flexion 60 155 90* 110* 108 110 AA: 120 A: 124 A: 128 A: 125 A: 130   Shoulder extension 15 40            Shoulder abduction 65 162 80* 100*    A: 100 A: 110 A: 112 A: 115   Shoulder adduction              Shoulder internal rotation 50 75 60* 60 *          Shoulder external rotation 25 75 15-20* very guarded and pain limited  30 * 30   A: 50 A: 55 A: 54 A; 55   (Blank rows = not tested)  UPPER EXTREMITY MMT:  MMT Right 10/31/2023 Left 10/31/2023 Rt 12/18/23 Rt 01/09/24 Right 01/18/2024  Shoulder flexion 2 5 3  3+ 4/5  Shoulder extension 3 5 3+ 3+   Shoulder abduction 2 5 3  3+ 3+/5  Shoulder adduction       Shoulder internal rotation 2 5 3   5/5  Shoulder external rotation 2 5 3   4/5  (Blank rows = not tested)   PALPATION:  10/31/2023 TTP: Rt upper trap, Rt bicep tendons. Rt deltoid, Rt supraspinatus.  TODAY'S TREATMENT:                                                                                            DATE: 02/19/2024:  Theract: Pulleys for functional reach flexion, scaption Rt arm with eccentric lowering focus 3 mins each way, rest required between bouts  TherEx:    TODAY'S TREATMENT:                                                                                            DATE: 02/07/2024:  Theract: Pulleys for functional reach flexion, scaption Rt arm with eccentric lowering focus 3 mins each way, rest required between bouts  TherEx:  Green Theraband row/extension 2x15 ea; vc for proper form  and reduction of shoulder shrug RUE green tband ER walkout 2x10 Standing abduction to 90deg 2x8 with mirror for visual feedback   Due to increased pain and patient voicing more comfort with standing, supine activity was not done today.   TODAY'S TREATMENT:                                                                                            DATE: 01/29/2024:  TherEx:  Sidelying Rt shoulder abduction 2 x 15 1 lb  Sidelying Rt shoulder ER 1 lb 2 x 15 c towel under arm  Additional time spent in verbal review of existing HEP.   Neuro Reed Stabilizations in 90 deg flexion c mild resistance 30 sec bouts  TherActivity Pulleys for functional reach flexion, scaption Rt arm with eccentric lowering focus 3 mins each way  Functional reach to overhead holding towel x 25 Rt  TODAY'S TREATMENT:                                                                                            DATE: 01/18/2024:  TherEx:  Pulleys flexion 3 mins, scaption 3 mins with outstretched arm and eccentric lowering focus Tband rows blue band bilaterally 2 x 15 Tband gh ext blue band bilaterally 2 x 15 Tband Green  band Rt ER walk outs with arm at side with towel 5 sec hold x 12  Sidelying Rt shoulder abduction 1 lb 2 x 15 Sidelying Rt shoulder flexion 2 x 15       TODAY'S TREATMENT:                                                                                            DATE: 01/12/24: TherEx:  UBE: Level 3 x  4 minutes each direction, 1 minute rest break in between  Cross arm stretch x30sec throughout session   Side lying Rt shoulder abd x 20 Side lying Rt Shoulder ER x 20  Prone Y and T RUE only x10 ea; tc for reduction of compensatory strategies  Manual: Grade II-III GH A-P, inferior, and P-A glides in varied degrees of abduction for facilitation of movement Bicep soft tissue mobilization to tolerance for reduction of pain and increased mobility due to patient guarding   PATIENT  EDUCATION: 01/18/2024 Education details: HEP update Person educated: Patient Education method: Programmer, multimedia, Demonstration, Verbal cues, and Handouts Education comprehension: verbalized understanding, returned demonstration, and verbal cues required  HOME EXERCISE PROGRAM: Access Code: [email or web address] URL: [email or web address] Date: 01/18/2024 Prepared by: Chyrel Masson  Exercises - Supine Shoulder External Rotation in 45 Degrees Abduction AAROM with Dowel (Mirrored)  - 2-3 x daily - 7 x weekly - 2 sets - 10 reps - 2 seconds hold - Standing Shoulder Posterior Capsule Stretch (Mirrored)  - 2-3 x daily - 7 x weekly - 1 sets - 3 reps - 15-30 hold - Sleeper Stretch (Mirrored)  - 2-3 x daily - 7 x weekly - 1 sets - 3 reps - 30 hold - Standing Shoulder Row with Anchored Resistance  - 1 x daily - 7 x weekly - 1-2 sets - 10-15 reps - Shoulder Extension with Resistance  - 1 x daily - 7 x weekly - 1-2 sets - 10-15 reps - Shoulder External Rotation Reactive Isometrics (Mirrored)  - 1-2 x daily - 7 x weekly - 1 sets - 10 reps - 5-15 hold - Sidelying Shoulder Abduction Palm Forward  - 1-2 x daily - 7 x weekly - 2-3 sets - 10-15 reps - Sidelying Shoulder Flexion 15 Degrees (Mirrored)  - 1-2 x daily - 7 x weekly - 2-3 sets - 10-15 reps  ASSESSMENT:  CLINICAL IMPRESSION: Despite patients increased pain from HS flare-up, she was able to tolerate standing activity without compensatory strategies. Required increased rest break between activity due to pain caused by flare-up. Patient will benefit from continued strengthening as well as stretching as tolerated for increased function.    OBJECTIVE IMPAIRMENTS: decreased ROM, decreased strength, increased edema, impaired UE functional use, postural dysfunction, and pain.   ACTIVITY LIMITATIONS: carrying, sleeping, bathing, toileting, dressing, reach over head, and hygiene/grooming  PARTICIPATION LIMITATIONS: meal prep, cleaning, laundry, driving,  shopping, community activity, and occupation  PERSONAL FACTORS: 3+ comorbidities: see pertinent history  are also affecting patient's functional outcome.   REHAB POTENTIAL: Good  CLINICAL DECISION MAKING: Stable/uncomplicated  EVALUATION COMPLEXITY: Low   GOALS: Goals reviewed with patient? Yes  SHORT TERM GOALS: (target  date for Short term goals are 3 weeks 11/24/23)  1.Patient will demonstrate independent use of home exercise program to maintain progress from in clinic treatments. Goal status: MET 11/14/23  LONG TERM GOALS: (target dates for all long term goals are 6 additional weeks  02/29/2024 )   1. Patient will demonstrate/report pain at worst less than or equal to 2/10 to facilitate minimal limitation in daily activity secondary to pain symptoms. Goal status: revised 01/18/2024   2. Patient will demonstrate independent use of home exercise program to facilitate ability to maintain/progress functional gains from skilled physical therapy services. Goal status revised 01/18/2024   3. Patient will demonstrate FOTO outcome > or = 61 % to indicate reduced disability due to condition. Goal status:met 01/18/2024   4.  Patient will demonstrate Rt UE MMT >/= 4/5 throughout to facilitate lifting, reaching, carrying at [facility] in daily activity.   Goal status: revised 01/18/2024   5.  Patient will demonstrate Rt GH joint AROM WFL s symptoms to facilitate usual overhead reaching, self care, dressing at PLOF.    Goal status: revised 01/18/2024   6.  Pt will improve her Rt shoulder flexion to >/= 140 degrees for improvements in ADL's.  Goal status: revised 01/18/2024  7. Pt will improve her Rt shoulder ER to >/= 60 degrees in order to improved functional actives.   Goal Status: revised 01/18/2024     PLAN:  PT FREQUENCY: 1x/week  PT DURATION: 6 weeks  PLANNED INTERVENTIONS: Can include 09811- PT Re-evaluation, 97110-Therapeutic exercises, 97530- Therapeutic activity, 97112-  Neuromuscular re-education, 97535- Self Care, 97140- Manual therapy, [phone]- Gait training, [phone]- Orthotic Fit/training, [phone]- Canalith repositioning, [id number]- Aquatic Therapy, 97014- Electrical stimulation (unattended), [id number]- Electrical stimulation (manual), [id number]- Vasopneumatic device, [id number]- Ultrasound, [id number]- Traction (mechanical), [id number]- Ionotophoresis 4mg /ml Dexamethasone, Patient/Family education, Balance training, Stair training, Taping, Dry Needling, Joint mobilization, Joint manipulation, Spinal manipulation, Spinal mobilization, Scar mobilization, Vestibular training, Visual/preceptual remediation/compensation, DME instructions, Cryotherapy, and Moist heat.  All performed as medically necessary.  All included unless contraindicated  PLAN FOR NEXT SESSION:  continue strengthening as tolerated   Chyrel Masson, PT, DPT, OCS, ATC 02/19/24  7:56 AM

## 2024-02-26 ENCOUNTER — Encounter: Payer: Self-pay | Admitting: Rehabilitative and Restorative Service Providers"

## 2024-02-26 ENCOUNTER — Ambulatory Visit (INDEPENDENT_AMBULATORY_CARE_PROVIDER_SITE_OTHER): Payer: No Typology Code available for payment source | Admitting: Rehabilitative and Restorative Service Providers"

## 2024-02-26 DIAGNOSIS — R600 Localized edema: Secondary | ICD-10-CM | POA: Diagnosis not present

## 2024-02-26 DIAGNOSIS — M25511 Pain in right shoulder: Secondary | ICD-10-CM | POA: Diagnosis not present

## 2024-02-26 DIAGNOSIS — M6281 Muscle weakness (generalized): Secondary | ICD-10-CM | POA: Diagnosis not present

## 2024-02-26 NOTE — Therapy (Signed)
 OUTPATIENT PHYSICAL THERAPY TREATMENT  Patient Name: Misty Bailey MRN: [id number] DOB:[birth year], 53 y.o., female Today's Date: 02/26/2024   END OF SESSION:  PT End of Session - 02/26/24 1024     Visit Number 22    Number of Visits 24    Date for PT Re-Evaluation 02/29/24    Authorization Type [employer] $40 copay    Authorization - Number of Visits 58    Progress Note Due on Visit 20    PT Start Time 1018    PT Stop Time 1057    PT Time Calculation (min) 39 min    Activity Tolerance Patient tolerated treatment well    Behavior During Therapy [facility] for tasks assessed/performed                  Past Medical History:  Diagnosis Date   Anemia    Anxiety    Arthritis 02/23/2014   Rib Cage   Asthma    Depression    Diverticulosis    Dysmenorrhea 05/08/2018   Essential hypertension 07/29/2019   HLD (hyperlipidemia)    Panic attacks    Pre-diabetes    Suppurative hidradenitis    Past Surgical History:  Procedure Laterality Date   CHOLECYSTECTOMY N/A 06/28/2023   Procedure: LAPAROSCOPIC CHOLECYSTECTOMY;  Surgeon: Abigail Miyamoto, MD;  Location: [facility] OR;  Service: General;  Laterality: N/A;  TAP BLOCK   INGUINAL HERNIA REPAIR Left 06/28/2023   Procedure: OPEN LEFT INGUINAL HERNIA REPAIR WITH MESH;  Surgeon: Abigail Miyamoto, MD;  Location: [facility] OR;  Service: General;  Laterality: Left;   oral surgery     PELVIC LAPAROSCOPY  1998/1999 `   pelvic adhesions and pain   TOE SURGERY Left    great toe   WISDOM TOOTH EXTRACTION     Patient Active Problem List   Diagnosis Date Noted   Adhesive capsulitis of right shoulder 11/28/2023   B12 deficiency 11/28/2023   Neuropathy 06/22/2023   Inguinal hernia of left side without obstruction or gangrene 04/30/2020   Hyperlipidemia 07/30/2019   Syncope 07/29/2019   Essential hypertension 07/29/2019   Dysmenorrhea 05/08/2018   Herpes 03/21/2014   Suppurative hidradenitis    Depression     PCP: Marcine Matar,  MD   REFERRING PROVIDER: Kathryne Hitch*   REFERRING DIAG:  Diagnosis  M24.611 (ICD-10-CM) - Arthrofibrosis of right shoulder  Z98.890 (ICD-10-CM) - Status post arthroscopy of right shoulder    THERAPY DIAG:  Acute pain of right shoulder  Muscle weakness (generalized)  Localized edema  Rationale for Evaluation and Treatment: Rehabilitation  ONSET DATE: February 2024  SUBJECTIVE:  SUBJECTIVE STATEMENT: Pt indicated doing better with other medical complaints.  Pt indicated shoulder up to 5-6/10 and felt a little more tight in last week. Pt indicated she tried to keep therapy going at home to keep motion going.   PERTINENT HISTORY: Pt s/p on 10/12/23 manipulation c debridement of anterior capsule,  inferior capsule and subacrominal space  PMH: anemia, anxiety, asthma, depression, diverticulosis, essential HTN, panic attacks, pre-diabetes, hernia repair, oral surgery, toe surgery, pelvic laparoscopy, cholecystectomy   PAIN:  NPRS scale: 5-6/10 at worst.  Pain location: Rt shoulder- anterior and lateral, some posterior shoulder  Pain description: achy, sore, sharp at times, constant Aggravating factors: lifting my arm, ADL's Relieving factors: resting, pain meds as needed  PRECAUTIONS: None  WEIGHT BEARING RESTRICTIONS: No  FALLS:  Has patient fallen in last 6 months? Yes. Number of falls pt stating no injuries, pt stating she was dehydrated.   LIVING ENVIRONMENT: Lives with: lives with their family and lives alone Lives in: House/apartment Stairs: No Has following equipment at home: None  OCCUPATION: Out of work since May  PLOF: Independent  PATIENT GOALS: be able reach overhead   OBJECTIVE:   DIAGNOSTIC FINDINGS:  10/31/2023 review of chart:  IMPRESSION: 1. Mild  tendinosis of the supraspinatus tendon anteriorly. 2. Mild subacromial/subdeltoid bursitis. 3. Mild relative thickening of the inferior joint capsule as can be seen with adhesive capsulitis.  PATIENT SURVEYS:  01/18/2024:  FOTO update 62  12/18/2023: FOTO update 46%  11/21/23: FOTO update 48%  10/31/23: FOTO intake:  41%      COGNITION: 10/31/2023 Overall cognitive status: WFL     SENSATION: 10/31/2023 Tingling in Rt fingers, pt feels that may be due to B-12 deficiency  POSTURE 10/31/2023 Forward head and rounded shoulders  UPPER EXTREMITY ROM:   ROM (* indicated pain) Right 10/31/23 Left eval Right  PROM 11/07/23 Rt 11/14/23 PROM supine Rt 11/21/23 PROM supine Rt 11/29/2023 AROM Rt 12/05/23 Rt 12/12/23 Rt 12/26/23 supine Rt 01/02/24 supine Rt 01/09/24 supine Right 02/26/2024  Shoulder flexion 60 155 90* 110* 108 110 AA: 120 A: 124 A: 128 A: 125 A: 130 AROM in supine 130   AROM in standing against gravity: 125 deg  Shoulder extension 15 40            Shoulder abduction 65 162 80* 100*    A: 100 A: 110 A: 112 A: 115 AROM in supine 125  Shoulder adduction              Shoulder internal rotation 50 75 60* 60 *          Shoulder external rotation 25 75 15-20* very guarded and pain limited  30 * 30   A: 50 A: 55 A: 54 A; 55 AROM in supine in 45 deg abduction: 65  (Blank rows = not tested)  UPPER EXTREMITY MMT:  MMT Right 10/31/2023 Left 10/31/2023 Rt 12/18/23 Rt 01/09/24 Right 01/18/2024  Shoulder flexion 2 5 3  3+ 4/5  Shoulder extension 3 5 3+ 3+   Shoulder abduction 2 5 3  3+ 3+/5  Shoulder adduction       Shoulder internal rotation 2 5 3   5/5  Shoulder external rotation 2 5 3   4/5  (Blank rows = not tested)   PALPATION:  10/31/2023 TTP: Rt upper trap, Rt bicep tendons. Rt deltoid, Rt supraspinatus.  TODAY'S TREATMENT:                                                                                            DATE: 02/26/2024:  Manual Supine Rt shoulder g2 inferior jt mobs, Rt shoulder posterior glide c ER passive range mobilization c movement  Neuro Re-ed( to improve neural recruitment, postural activation and coordinatoin) Rt shoulder Stabilizations in 90 deg flexion supine with mild resistance from clinician 20-30 sec bouts  Standing blue band rows c scapular retraction focus 2 x 15 Standing blue band GH ext 2 x 15 Standing Rt shoulder reactive isometric walk out for ER c towel under arm 5 sec hold x 10 green band   Theract ( to improve reaching, overhead reach, lifting ability of arm for daily activity) Pulleys for functional reach flexion, scaption Rt arm with eccentric lowering focus 3 mins each way, rest required between bouts Standing Rt shoulder flexion overhead and return slowly lowering x 5    TODAY'S TREATMENT:                                                                                            DATE: 02/07/2024:  Theract: Pulleys for functional reach flexion, scaption Rt arm with eccentric lowering focus 3 mins each way, rest required between bouts  TherEx:  Green Theraband row/extension 2x15 ea; vc for proper form and reduction of shoulder shrug RUE green tband ER walkout 2x10 Standing abduction to 90deg 2x8 with mirror for visual feedback   Due to increased pain and patient voicing more comfort with standing, supine activity was not done today.   TODAY'S TREATMENT:                                                                                            DATE: 01/29/2024:  TherEx:  Sidelying Rt shoulder abduction 2 x 15 1 lb  Sidelying Rt shoulder ER 1 lb 2 x 15 c towel under arm  Additional time spent in verbal review of existing HEP.   Neuro Reed Stabilizations in 90 deg flexion c mild resistance 30 sec  bouts  TherActivity Pulleys for functional reach flexion, scaption Rt arm with eccentric lowering focus 3 mins each way  Functional reach to overhead holding towel x 25 Rt  TODAY'S TREATMENT:  DATE: 01/18/2024:  TherEx:  Pulleys flexion 3 mins, scaption 3 mins with outstretched arm and eccentric lowering focus Tband rows blue band bilaterally 2 x 15 Tband gh ext blue band bilaterally 2 x 15 Tband Green band Rt ER walk outs with arm at side with towel 5 sec hold x 12  Sidelying Rt shoulder abduction 1 lb 2 x 15 Sidelying Rt shoulder flexion 2 x 15    PATIENT EDUCATION: 01/18/2024 Education details: HEP update Person educated: Patient Education method: Programmer, multimedia, Demonstration, Verbal cues, and Handouts Education comprehension: verbalized understanding, returned demonstration, and verbal cues required  HOME EXERCISE PROGRAM: Access Code: [email or web address] URL: [email or web address] Date: 01/18/2024 Prepared by: Chyrel Masson  Exercises - Supine Shoulder External Rotation in 45 Degrees Abduction AAROM with Dowel (Mirrored)  - 2-3 x daily - 7 x weekly - 2 sets - 10 reps - 2 seconds hold - Standing Shoulder Posterior Capsule Stretch (Mirrored)  - 2-3 x daily - 7 x weekly - 1 sets - 3 reps - 15-30 hold - Sleeper Stretch (Mirrored)  - 2-3 x daily - 7 x weekly - 1 sets - 3 reps - 30 hold - Standing Shoulder Row with Anchored Resistance  - 1 x daily - 7 x weekly - 1-2 sets - 10-15 reps - Shoulder Extension with Resistance  - 1 x daily - 7 x weekly - 1-2 sets - 10-15 reps - Shoulder External Rotation Reactive Isometrics (Mirrored)  - 1-2 x daily - 7 x weekly - 1 sets - 10 reps - 5-15 hold - Sidelying Shoulder Abduction Palm Forward  - 1-2 x daily - 7 x weekly - 2-3 sets - 10-15 reps - Sidelying Shoulder Flexion 15 Degrees (Mirrored)  - 1-2 x daily - 7 x weekly - 2-3 sets - 10-15  reps  ASSESSMENT:  CLINICAL IMPRESSION: Rt shoulder active and passive range more limited by pain and muscle related guarding/pain vs. Capsular tightness as observed in clinic.  Good news was that patient didn't regress in off time of therapy due to other medical complications.  Continued skilled PT services indicated at this time.  Has one more visit current scheduled with recert required at that visit.    OBJECTIVE IMPAIRMENTS: decreased ROM, decreased strength, increased edema, impaired UE functional use, postural dysfunction, and pain.   ACTIVITY LIMITATIONS: carrying, sleeping, bathing, toileting, dressing, reach over head, and hygiene/grooming  PARTICIPATION LIMITATIONS: meal prep, cleaning, laundry, driving, shopping, community activity, and occupation  PERSONAL FACTORS: 3+ comorbidities: see pertinent history  are also affecting patient's functional outcome.   REHAB POTENTIAL: Good  CLINICAL DECISION MAKING: Stable/uncomplicated  EVALUATION COMPLEXITY: Low   GOALS: Goals reviewed with patient? Yes  SHORT TERM GOALS: (target date for Short term goals are 3 weeks 11/24/23)  1.Patient will demonstrate independent use of home exercise program to maintain progress from in clinic treatments. Goal status: MET 11/14/23  LONG TERM GOALS: (target dates for all long term goals are 6 additional weeks  02/29/2024 )   1. Patient will demonstrate/report pain at worst less than or equal to 2/10 to facilitate minimal limitation in daily activity secondary to pain symptoms. Goal status: on going 02/26/2024   2. Patient will demonstrate independent use of home exercise program to facilitate ability to maintain/progress functional gains from skilled physical therapy services. Goal status on going 02/26/2024   3. Patient will demonstrate FOTO outcome > or = 61 % to indicate reduced disability due to condition. Goal status:met 01/18/2024   4.  Patient will  demonstrate Rt UE MMT >/= 4/5  throughout to facilitate lifting, reaching, carrying at [facility] in daily activity.   Goal status:on going 02/26/2024   5.  Patient will demonstrate Rt GH joint AROM WFL s symptoms to facilitate usual overhead reaching, self care, dressing at PLOF.    Goal status:on going 02/26/2024   6.  Pt will improve her Rt shoulder flexion to >/= 140 degrees for improvements in ADL's.  Goal status:on going 02/26/2024  7. Pt will improve her Rt shoulder ER to >/= 60 degrees in order to improved functional actives.   Goal Status: on going 02/26/2024     PLAN:  PT FREQUENCY: 1x/week  PT DURATION: 6 weeks  PLANNED INTERVENTIONS: Can include 40981- PT Re-evaluation, 97110-Therapeutic exercises, 97530- Therapeutic activity, 97112- Neuromuscular re-education, 97535- Self Care, 97140- Manual therapy, [id number]- Gait training, [id number]- Orthotic Fit/training, [phone]- Canalith repositioning, [id number]- Aquatic Therapy, 97014- Electrical stimulation (unattended), [id number]- Electrical stimulation (manual), [id number]- Vasopneumatic device, [id number]- Ultrasound, [id number]- Traction (mechanical), [id number]- Ionotophoresis 4mg /ml Dexamethasone, Patient/Family education, Balance training, Stair training, Taping, Dry Needling, Joint mobilization, Joint manipulation, Spinal manipulation, Spinal mobilization, Scar mobilization, Vestibular training, Visual/preceptual remediation/compensation, DME instructions, Cryotherapy, and Moist heat.  All performed as medically necessary.  All included unless contraindicated  PLAN FOR NEXT SESSION:  Progress note next visit (recert)   Chyrel Masson, PT, DPT, OCS, ATC 02/26/24  10:56 AM

## 2024-03-04 ENCOUNTER — Ambulatory Visit (INDEPENDENT_AMBULATORY_CARE_PROVIDER_SITE_OTHER): Payer: No Typology Code available for payment source | Admitting: Rehabilitative and Restorative Service Providers"

## 2024-03-04 ENCOUNTER — Encounter: Payer: Self-pay | Admitting: Rehabilitative and Restorative Service Providers"

## 2024-03-04 DIAGNOSIS — M6281 Muscle weakness (generalized): Secondary | ICD-10-CM

## 2024-03-04 DIAGNOSIS — M25511 Pain in right shoulder: Secondary | ICD-10-CM | POA: Diagnosis not present

## 2024-03-04 DIAGNOSIS — R600 Localized edema: Secondary | ICD-10-CM

## 2024-03-04 NOTE — Therapy (Signed)
 OUTPATIENT PHYSICAL THERAPY TREATMENT / PROGRESS NOTE/ RECERT  Patient Name: Misty Bailey MRN: [id number] DOB:1971/06/17, 53 y.o., female Today's Date: 03/04/2024  Progress Note Reporting Period 01/18/2024 to 03/04/2024  See note below for Objective Data and Assessment of Progress/Goals.      END OF SESSION:  PT End of Session - 03/04/24 1019     Visit Number 23    Number of Visits 24    Date for PT Re-Evaluation 04/15/24    Authorization Type [employer] $40 copay    Authorization - Number of Visits 58    Progress Note Due on Visit 20    PT Start Time 1014    PT Stop Time 1103    PT Time Calculation (min) 49 min    Activity Tolerance Patient limited by pain    Behavior During Therapy [facility] for tasks assessed/performed                   Past Medical History:  Diagnosis Date   Anemia    Anxiety    Arthritis 02/23/2014   Rib Cage   Asthma    Depression    Diverticulosis    Dysmenorrhea 05/08/2018   Essential hypertension 07/29/2019   HLD (hyperlipidemia)    Panic attacks    Pre-diabetes    Suppurative hidradenitis    Past Surgical History:  Procedure Laterality Date   CHOLECYSTECTOMY N/A 06/28/2023   Procedure: LAPAROSCOPIC CHOLECYSTECTOMY;  Surgeon: Abigail Miyamoto, MD;  Location: [facility] OR;  Service: General;  Laterality: N/A;  TAP BLOCK   INGUINAL HERNIA REPAIR Left 06/28/2023   Procedure: OPEN LEFT INGUINAL HERNIA REPAIR WITH MESH;  Surgeon: Abigail Miyamoto, MD;  Location: [facility] OR;  Service: General;  Laterality: Left;   oral surgery     PELVIC LAPAROSCOPY  1998/1999 `   pelvic adhesions and pain   TOE SURGERY Left    great toe   WISDOM TOOTH EXTRACTION     Patient Active Problem List   Diagnosis Date Noted   Adhesive capsulitis of right shoulder 11/28/2023   B12 deficiency 11/28/2023   Neuropathy 06/22/2023   Inguinal hernia of left side without obstruction or gangrene 04/30/2020   Hyperlipidemia 07/30/2019   Syncope 07/29/2019   Essential  hypertension 07/29/2019   Dysmenorrhea 05/08/2018   Herpes 03/21/2014   Suppurative hidradenitis    Depression     PCP: Marcine Matar, MD   REFERRING PROVIDER: Kathryne Hitch*   REFERRING DIAG:  Diagnosis  M24.611 (ICD-10-CM) - Arthrofibrosis of right shoulder  Z98.890 (ICD-10-CM) - Status post arthroscopy of right shoulder    THERAPY DIAG:  Acute pain of right shoulder  Muscle weakness (generalized)  Localized edema  Rationale for Evaluation and Treatment: Rehabilitation  ONSET DATE: February 2024  SUBJECTIVE:  SUBJECTIVE STATEMENT: Pt indicated symptoms about a 5/10 upon arrival today.  About 75 % overall improvement noted.   PERTINENT HISTORY: Pt s/p on 10/12/23 manipulation c debridement of anterior capsule,  inferior capsule and subacrominal space  PMH: anemia, anxiety, asthma, depression, diverticulosis, essential HTN, panic attacks, pre-diabetes, hernia repair, oral surgery, toe surgery, pelvic laparoscopy, cholecystectomy   PAIN:  NPRS scale: 5-6/10 at worst.  Pain location: Rt shoulder- anterior and lateral, some posterior shoulder  Pain description: achy, sore, sharp at times, constant Aggravating factors: lifting my arm, ADL's Relieving factors: resting, pain meds as needed  PRECAUTIONS: None  WEIGHT BEARING RESTRICTIONS: No  FALLS:  Has patient fallen in last 6 months? Yes. Number of falls pt stating no injuries, pt stating she was dehydrated.   LIVING ENVIRONMENT: Lives with: lives with their family and lives alone Lives in: House/apartment Stairs: No Has following equipment at home: None  OCCUPATION: Out of work since May  PLOF: Independent  PATIENT GOALS: be able reach overhead   OBJECTIVE:   DIAGNOSTIC FINDINGS:  10/31/2023 review of chart:   IMPRESSION: 1. Mild tendinosis of the supraspinatus tendon anteriorly. 2. Mild subacromial/subdeltoid bursitis. 3. Mild relative thickening of the inferior joint capsule as can be seen with adhesive capsulitis.  PATIENT SURVEYS:  01/18/2024:  FOTO update 62  12/18/2023: FOTO update 46%  11/21/23: FOTO update 48%  10/31/23: FOTO intake:  41%      COGNITION: 10/31/2023 Overall cognitive status: WFL     SENSATION: 10/31/2023 Tingling in Rt fingers, pt feels that may be due to B-12 deficiency  POSTURE 10/31/2023 Forward head and rounded shoulders  UPPER EXTREMITY ROM:   ROM (* indicated pain) Right 10/31/23 Left eval Right  PROM 11/07/23 Rt 11/14/23 PROM supine Rt 11/21/23 PROM supine Rt 11/29/2023 AROM Rt 12/05/23 Rt 12/12/23 Rt 12/26/23 supine Rt 01/02/24 supine Rt 01/09/24 supine Right 02/26/2024  Shoulder flexion 60 155 90* 110* 108 110 AA: 120 A: 124 A: 128 A: 125 A: 130 AROM in supine 130   AROM in standing against gravity: 125 deg  Shoulder extension 15 40            Shoulder abduction 65 162 80* 100*    A: 100 A: 110 A: 112 A: 115 AROM in supine 125  Shoulder adduction              Shoulder internal rotation 50 75 60* 60 *          Shoulder external rotation 25 75 15-20* very guarded and pain limited  30 * 30   A: 50 A: 55 A: 54 A; 55 AROM in supine in 45 deg abduction: 65  (Blank rows = not tested)  UPPER EXTREMITY MMT:  MMT Right 10/31/2023 Left 10/31/2023 Rt 12/18/23 Rt 01/09/24 Right 01/18/2024 Right 03/04/2024  Shoulder flexion 2 5 3  3+ 4/5 4+/5 c pain  Shoulder extension 3 5 3+ 3+    Shoulder abduction 2 5 3  3+ 3+/5 4/5 c pain  Shoulder adduction        Shoulder internal rotation 2 5 3   5/5 5/5  Shoulder external rotation 2 5 3   4/5 4+/5  (Blank rows = not tested)   PALPATION:  10/31/2023 TTP: Rt upper trap, Rt bicep tendons. Rt deltoid, Rt supraspinatus.  TODAY'S TREATMENT:                                                                                            DATE: 03/04/2024:  Neuro Re-ed( to improve neural recruitment, postural activation and coordinatoin) Standing blue band rows c scapular retraction focus 2 x 15 Standing blue band GH ext 2 x 15 c focus on movement into extension  UBE fwd/back 3 mins each way for muscle activation. 1 in rest between   Theract ( to improve reaching, overhead reach, lifting ability of arm for daily activity) Pulleys for functional reach flexion, scaption Rt arm with eccentric lowering focus 3 mins each way, rest required between bouts Standing wand 1lb AAROM flexion with eccentric lowering focus x 15 Standing wand 1 lb GH ext away from buttock x 10  2-3 sec  Vasopneumatic Device Rt shoulder low compression 34 degrees 10 mins in sitting.   TODAY'S TREATMENT:                                                                                            DATE: 02/26/2024:  Manual Supine Rt shoulder g2 inferior jt mobs, Rt shoulder posterior glide c ER passive range mobilization c movement  Neuro Re-ed( to improve neural recruitment, postural activation and coordinatoin) Rt shoulder Stabilizations in 90 deg flexion supine with mild resistance from clinician 20-30 sec bouts  Standing blue band rows c scapular retraction focus 2 x 15 Standing blue band GH ext 2 x 15 Standing Rt shoulder reactive isometric walk out for ER c towel under arm 5 sec hold x 10 green band   Theract ( to improve reaching, overhead reach, lifting ability of arm for daily activity) Pulleys for functional reach flexion, scaption Rt arm with eccentric lowering focus 3 mins each way, rest required between bouts Standing Rt shoulder flexion overhead and return slowly lowering x 5    TODAY'S TREATMENT:                                                                                             DATE: 02/07/2024:  Theract: Pulleys for functional reach flexion, scaption Rt arm with eccentric lowering focus 3 mins each way, rest required between bouts  TherEx:  Green Theraband row/extension 2x15 ea; vc for proper form and reduction of shoulder shrug RUE green tband ER walkout 2x10 Standing abduction to 90deg 2x8 with  mirror for visual feedback   Due to increased pain and patient voicing more comfort with standing, supine activity was not done today.   TODAY'S TREATMENT:                                                                                            DATE: 01/29/2024:  TherEx:  Sidelying Rt shoulder abduction 2 x 15 1 lb  Sidelying Rt shoulder ER 1 lb 2 x 15 c towel under arm  Additional time spent in verbal review of existing HEP.   Neuro Reed Stabilizations in 90 deg flexion c mild resistance 30 sec bouts  TherActivity Pulleys for functional reach flexion, scaption Rt arm with eccentric lowering focus 3 mins each way  Functional reach to overhead holding towel x 25 Rt    PATIENT EDUCATION: 01/18/2024 Education details: HEP update Person educated: Patient Education method: Programmer, multimedia, Demonstration, Verbal cues, and Handouts Education comprehension: verbalized understanding, returned demonstration, and verbal cues required  HOME EXERCISE PROGRAM: Access Code: [email or web address] URL: [email or web address] Date: 01/18/2024 Prepared by: Chyrel Masson  Exercises - Supine Shoulder External Rotation in 45 Degrees Abduction AAROM with Dowel (Mirrored)  - 2-3 x daily - 7 x weekly - 2 sets - 10 reps - 2 seconds hold - Standing Shoulder Posterior Capsule Stretch (Mirrored)  - 2-3 x daily - 7 x weekly - 1 sets - 3 reps - 15-30 hold - Sleeper Stretch (Mirrored)  - 2-3 x daily - 7 x weekly - 1 sets - 3 reps - 30 hold - Standing Shoulder Row with Anchored Resistance  - 1 x daily - 7 x weekly - 1-2 sets -  10-15 reps - Shoulder Extension with Resistance  - 1 x daily - 7 x weekly - 1-2 sets - 10-15 reps - Shoulder External Rotation Reactive Isometrics (Mirrored)  - 1-2 x daily - 7 x weekly - 1 sets - 10 reps - 5-15 hold - Sidelying Shoulder Abduction Palm Forward  - 1-2 x daily - 7 x weekly - 2-3 sets - 10-15 reps - Sidelying Shoulder Flexion 15 Degrees (Mirrored)  - 1-2 x daily - 7 x weekly - 2-3 sets - 10-15 reps  ASSESSMENT:  CLINICAL IMPRESSION: The patient has attended 23 visits over the course of treatment cycle and 4 since last progress note.  Treatment was interrupted due to other medical condition in that timeline. Patient has reported overall improvement at 75 % with global rating of change at at quite a bit better +5.  See objective data above for updated information regarding current presentation.  Pt has continued to show steady overall progress in mobility and strength.  Despite improvements, end range restriction with pain still noted in various Rt shoulder movements as well as some functional strength deficits that can impact functional use of arm.  Recommend continued skilled PT services through follow up of MD visit with goals of continued improvement and HEP transitioning when appropriate.     OBJECTIVE IMPAIRMENTS: decreased ROM, decreased strength, increased edema, impaired UE functional use, postural dysfunction, and pain.   ACTIVITY LIMITATIONS: carrying, sleeping, bathing, toileting, dressing, reach over head, and  hygiene/grooming  PARTICIPATION LIMITATIONS: meal prep, cleaning, laundry, driving, shopping, community activity, and occupation  PERSONAL FACTORS: 3+ comorbidities: see pertinent history  are also affecting patient's functional outcome.   REHAB POTENTIAL: Good  CLINICAL DECISION MAKING: Stable/uncomplicated  EVALUATION COMPLEXITY: Low   GOALS: Goals reviewed with patient? Yes  SHORT TERM GOALS: (target date for Short term goals are 3 weeks  11/24/23)  1.Patient will demonstrate independent use of home exercise program to maintain progress from in clinic treatments. Goal status: MET 11/14/23  LONG TERM GOALS: (target dates for all long term goals are 6 additional weeks  04/15/2024 )   1. Patient will demonstrate/report pain at worst less than or equal to 2/10 to facilitate minimal limitation in daily activity secondary to pain symptoms. Goal status: revised goal date - 03/04/2024   2. Patient will demonstrate independent use of home exercise program to facilitate ability to maintain/progress functional gains from skilled physical therapy services. Goal status  revised goal date - 03/04/2024   3. Patient will demonstrate FOTO outcome > or = 61 % to indicate reduced disability due to condition. Goal status:met 01/18/2024   4.  Patient will demonstrate Rt UE MMT >/= 4/5 throughout to facilitate lifting, reaching, carrying at [facility] in daily activity.   Goal status: revised goal date - 03/04/2024   5.  Patient will demonstrate Rt GH joint AROM WFL s symptoms to facilitate usual overhead reaching, self care, dressing at PLOF.    Goal status: revised goal date - 03/04/2024   6.  Pt will improve her Rt shoulder flexion to >/= 140 degrees for improvements in ADL's.  Goal status: revised goal date - 03/04/2024  7. Pt will improve her Rt shoulder ER to >/= 60 degrees in order to improved functional actives.   Goal Status: revised goal date - 03/04/2024     PLAN:  PT FREQUENCY: 1x/week  PT DURATION: 6 weeks  PLANNED INTERVENTIONS: Can include 16109- PT Re-evaluation, 97110-Therapeutic exercises, 97530- Therapeutic activity, 97112- Neuromuscular re-education, 97535- Self Care, 97140- Manual therapy, [phone]- Gait training, [id number]- Orthotic Fit/training, [phone]- Canalith repositioning, [id number]- Aquatic Therapy, 97014- Electrical stimulation (unattended), [id number]- Electrical stimulation (manual), [id number]- Vasopneumatic device, [id number]- Ultrasound,  [id number]- Traction (mechanical), [id number]- Ionotophoresis 4mg /ml Dexamethasone, Patient/Family education, Balance training, Stair training, Taping, Dry Needling, Joint mobilization, Joint manipulation, Spinal manipulation, Spinal mobilization, Scar mobilization, Vestibular training, Visual/preceptual remediation/compensation, DME instructions, Cryotherapy, and Moist heat.  All performed as medically necessary.  All included unless contraindicated  PLAN FOR NEXT SESSION:  End range gains, functional elevation strengthening.    Chyrel Masson, PT, DPT, OCS, ATC 03/04/24  10:56 AM

## 2024-03-11 ENCOUNTER — Ambulatory Visit: Payer: Self-pay

## 2024-03-11 ENCOUNTER — Ambulatory Visit (INDEPENDENT_AMBULATORY_CARE_PROVIDER_SITE_OTHER): Admitting: Rehabilitative and Restorative Service Providers"

## 2024-03-11 ENCOUNTER — Telehealth: Payer: Self-pay

## 2024-03-11 ENCOUNTER — Ambulatory Visit: Admitting: Family Medicine

## 2024-03-11 ENCOUNTER — Ambulatory Visit (INDEPENDENT_AMBULATORY_CARE_PROVIDER_SITE_OTHER)

## 2024-03-11 ENCOUNTER — Encounter: Payer: Self-pay | Admitting: Rehabilitative and Restorative Service Providers"

## 2024-03-11 ENCOUNTER — Encounter: Payer: Self-pay | Admitting: Family Medicine

## 2024-03-11 VITALS — BP 122/86 | HR 69 | Ht 61.0 in | Wt 148.0 lb

## 2024-03-11 DIAGNOSIS — G8929 Other chronic pain: Secondary | ICD-10-CM

## 2024-03-11 DIAGNOSIS — M25512 Pain in left shoulder: Secondary | ICD-10-CM | POA: Diagnosis not present

## 2024-03-11 DIAGNOSIS — M25511 Pain in right shoulder: Secondary | ICD-10-CM

## 2024-03-11 DIAGNOSIS — M6281 Muscle weakness (generalized): Secondary | ICD-10-CM

## 2024-03-11 DIAGNOSIS — R600 Localized edema: Secondary | ICD-10-CM | POA: Diagnosis not present

## 2024-03-11 NOTE — Patient Instructions (Signed)
 Thank you for coming in today.  You received an injection today. Seek immediate medical attention if the joint becomes red, extremely painful, or is oozing fluid.  Please get an Xray today before you leave

## 2024-03-11 NOTE — Therapy (Signed)
 OUTPATIENT PHYSICAL THERAPY TREATMENT  Patient Name: Misty Bailey MRN: [id number] DOB:1971/02/09, 53 y.o., female Today's Date: 03/11/2024  END OF SESSION:  PT End of Session - 03/11/24 1011     Visit Number 24    Number of Visits 28    Date for PT Re-Evaluation 04/15/24    Authorization Type [employer] $40 copay    Authorization - Number of Visits 58    Progress Note Due on Visit 28    PT Start Time 1011    PT Stop Time 1100    PT Time Calculation (min) 49 min    Activity Tolerance Patient limited by pain    Behavior During Therapy [facility] for tasks assessed/performed                    Past Medical History:  Diagnosis Date   Anemia    Anxiety    Arthritis 02/23/2014   Rib Cage   Asthma    Depression    Diverticulosis    Dysmenorrhea 05/08/2018   Essential hypertension 07/29/2019   HLD (hyperlipidemia)    Panic attacks    Pre-diabetes    Suppurative hidradenitis    Past Surgical History:  Procedure Laterality Date   CHOLECYSTECTOMY N/A 06/28/2023   Procedure: LAPAROSCOPIC CHOLECYSTECTOMY;  Surgeon: Abigail Miyamoto, MD;  Location: [facility] OR;  Service: General;  Laterality: N/A;  TAP BLOCK   INGUINAL HERNIA REPAIR Left 06/28/2023   Procedure: OPEN LEFT INGUINAL HERNIA REPAIR WITH MESH;  Surgeon: Abigail Miyamoto, MD;  Location: [facility] OR;  Service: General;  Laterality: Left;   oral surgery     PELVIC LAPAROSCOPY  1998/1999 `   pelvic adhesions and pain   TOE SURGERY Left    great toe   WISDOM TOOTH EXTRACTION     Patient Active Problem List   Diagnosis Date Noted   Adhesive capsulitis of right shoulder 11/28/2023   B12 deficiency 11/28/2023   Neuropathy 06/22/2023   Inguinal hernia of left side without obstruction or gangrene 04/30/2020   Hyperlipidemia 07/30/2019   Syncope 07/29/2019   Essential hypertension 07/29/2019   Dysmenorrhea 05/08/2018   Herpes 03/21/2014   Suppurative hidradenitis    Depression     PCP: Marcine Matar, MD   REFERRING  PROVIDER: Kathryne Hitch*   REFERRING DIAG:  Diagnosis  M24.611 (ICD-10-CM) - Arthrofibrosis of right shoulder  Z98.890 (ICD-10-CM) - Status post arthroscopy of right shoulder    THERAPY DIAG:  Acute pain of right shoulder  Muscle weakness (generalized)  Localized edema  Rationale for Evaluation and Treatment: Rehabilitation  ONSET DATE: February 2024  SUBJECTIVE:  SUBJECTIVE STATEMENT: Pt indicated getting injection in lt shoulder due to some pain and limited behind back motion.  Reported Rt shoulder "feeling ok."  Reported about 4/10.  Reported it doesn't feel as tight as last week.   PERTINENT HISTORY: Pt s/p on 10/12/23 manipulation c debridement of anterior capsule,  inferior capsule and subacrominal space  PMH: anemia, anxiety, asthma, depression, diverticulosis, essential HTN, panic attacks, pre-diabetes, hernia repair, oral surgery, toe surgery, pelvic laparoscopy, cholecystectomy   PAIN:  NPRS scale:4/10 upon arrival.  Pain location: Rt shoulder- anterior and lateral, some posterior shoulder  Pain description: achy, sore, sharp at times, constant Aggravating factors: lifting my arm, ADL's Relieving factors: resting, pain meds as needed  PRECAUTIONS: None  WEIGHT BEARING RESTRICTIONS: No  FALLS:  Has patient fallen in last 6 months? Yes. Number of falls pt stating no injuries, pt stating she was dehydrated.   LIVING ENVIRONMENT: Lives with: lives with their family and lives alone Lives in: House/apartment Stairs: No Has following equipment at home: None  OCCUPATION: Out of work since May  PLOF: Independent  PATIENT GOALS: be able reach overhead   OBJECTIVE:   DIAGNOSTIC FINDINGS:  10/31/2023 review of chart:  IMPRESSION: 1. Mild tendinosis of the supraspinatus  tendon anteriorly. 2. Mild subacromial/subdeltoid bursitis. 3. Mild relative thickening of the inferior joint capsule as can be seen with adhesive capsulitis.  PATIENT SURVEYS:  01/18/2024:  FOTO update 62  12/18/2023: FOTO update 46%  11/21/23: FOTO update 48%  10/31/23: FOTO intake:  41%      COGNITION: 10/31/2023 Overall cognitive status: WFL     SENSATION: 10/31/2023 Tingling in Rt fingers, pt feels that may be due to B-12 deficiency  POSTURE 10/31/2023 Forward head and rounded shoulders  UPPER EXTREMITY ROM:   ROM (* indicated pain) Right 10/31/23 Left eval Right  PROM 11/07/23 Rt 11/14/23 PROM supine Rt 11/21/23 PROM supine Rt 11/29/2023 AROM Rt 12/05/23 Rt 12/12/23 Rt 12/26/23 supine Rt 01/02/24 supine Rt 01/09/24 supine Right 02/26/2024  Shoulder flexion 60 155 90* 110* 108 110 AA: 120 A: 124 A: 128 A: 125 A: 130 AROM in supine 130   AROM in standing against gravity: 125 deg  Shoulder extension 15 40            Shoulder abduction 65 162 80* 100*    A: 100 A: 110 A: 112 A: 115 AROM in supine 125  Shoulder adduction              Shoulder internal rotation 50 75 60* 60 *          Shoulder external rotation 25 75 15-20* very guarded and pain limited  30 * 30   A: 50 A: 55 A: 54 A; 55 AROM in supine in 45 deg abduction: 65  (Blank rows = not tested)  UPPER EXTREMITY MMT:  MMT Right 10/31/2023 Left 10/31/2023 Rt 12/18/23 Rt 01/09/24 Right 01/18/2024 Right 03/04/2024  Shoulder flexion 2 5 3  3+ 4/5 4+/5 c pain  Shoulder extension 3 5 3+ 3+    Shoulder abduction 2 5 3  3+ 3+/5 4/5 c pain  Shoulder adduction        Shoulder internal rotation 2 5 3   5/5 5/5  Shoulder external rotation 2 5 3   4/5 4+/5  (Blank rows = not tested)   PALPATION:  10/31/2023 TTP: Rt upper trap, Rt bicep tendons. Rt deltoid, Rt supraspinatus.  TODAY'S TREATMENT:                                                                                            DATE: 03/11/2024:  Therex: UBE fwd/back 3 mins each way for ROM loosening to start treamtent,  1 min rest between - lvl 3.0 Standing green band ER walk outs isometric reactives c towel under arm 5 sec hold x 10  Standing green band ER c towel under arm x 10  Supine 90 deg flexion hold with circles cw, ccw 50 x each way 2 lbs  Supine 90 deg flexion protraction punch 3 sec hold 3 lbs x 10  Theract ( to improve reaching, overhead reach, lifting ability of arm for daily activity) UE ranger AAROM flexion with ipsilateral step in 2-3 sec hold x 15 UE ranger AAROM scaption with ipsilateral step in 2-3 sec hold x 15 Standing wand 1lb AAROM flexion with eccentric lowering focus x 15 Standing wand 1 lb GH ext away from buttock x 10  2-3 sec  Vasopneumatic Device Rt shoulder low compression 34 degrees 10 mins in sitting  TODAY'S TREATMENT:                                                                                            DATE: 03/04/2024:  Neuro Re-ed( to improve neural recruitment, postural activation and coordinatoin) Standing blue band rows c scapular retraction focus 2 x 15 Standing blue band GH ext 2 x 15 c focus on movement into extension  UBE fwd/back 3 mins each way for muscle activation. 1 in rest between   Theract ( to improve reaching, overhead reach, lifting ability of arm for daily activity) Pulleys for functional reach flexion, scaption Rt arm with eccentric lowering focus 3 mins each way, rest required between bouts Standing wand 1lb AAROM flexion with eccentric lowering focus x 15 Standing wand 1 lb GH ext away from buttock x 10  2-3 sec  Vasopneumatic Device Rt shoulder low compression 34 degrees 10 mins in sitting.   TODAY'S TREATMENT:                                                                                             DATE: 02/26/2024:  Manual Supine Rt shoulder g2 inferior jt mobs, Rt shoulder posterior glide c ER passive range mobilization c movement  Neuro Re-ed( to improve neural recruitment, postural  activation and coordinatoin) Rt shoulder Stabilizations in 90 deg flexion supine with mild resistance from clinician 20-30 sec bouts  Standing blue band rows c scapular retraction focus 2 x 15 Standing blue band GH ext 2 x 15 Standing Rt shoulder reactive isometric walk out for ER c towel under arm 5 sec hold x 10 green band   Theract ( to improve reaching, overhead reach, lifting ability of arm for daily activity) Pulleys for functional reach flexion, scaption Rt arm with eccentric lowering focus 3 mins each way, rest required between bouts Standing Rt shoulder flexion overhead and return slowly lowering x 5   PATIENT EDUCATION: 01/18/2024 Education details: HEP update Person educated: Patient Education method: Programmer, multimedia, Demonstration, Verbal cues, and Handouts Education comprehension: verbalized understanding, returned demonstration, and verbal cues required  HOME EXERCISE PROGRAM: Access Code: [email or web address] URL: [email or web address] Date: 01/18/2024 Prepared by: Chyrel Masson  Exercises - Supine Shoulder External Rotation in 45 Degrees Abduction AAROM with Dowel (Mirrored)  - 2-3 x daily - 7 x weekly - 2 sets - 10 reps - 2 seconds hold - Standing Shoulder Posterior Capsule Stretch (Mirrored)  - 2-3 x daily - 7 x weekly - 1 sets - 3 reps - 15-30 hold - Sleeper Stretch (Mirrored)  - 2-3 x daily - 7 x weekly - 1 sets - 3 reps - 30 hold - Standing Shoulder Row with Anchored Resistance  - 1 x daily - 7 x weekly - 1-2 sets - 10-15 reps - Shoulder Extension with Resistance  - 1 x daily - 7 x weekly - 1-2 sets - 10-15 reps - Shoulder External Rotation Reactive Isometrics (Mirrored)  - 1-2 x daily - 7 x weekly - 1 sets - 10 reps - 5-15 hold - Sidelying Shoulder Abduction  Palm Forward  - 1-2 x daily - 7 x weekly - 2-3 sets - 10-15 reps - Sidelying Shoulder Flexion 15 Degrees (Mirrored)  - 1-2 x daily - 7 x weekly - 2-3 sets - 10-15 reps  ASSESSMENT:  CLINICAL IMPRESSION: Some activity impacted by Lt shoulder, resulting in adaptations to avoid pain increases.  Elevation in standing AAROM activity did show continued improvement in ability.     OBJECTIVE IMPAIRMENTS: decreased ROM, decreased strength, increased edema, impaired UE functional use, postural dysfunction, and pain.   ACTIVITY LIMITATIONS: carrying, sleeping, bathing, toileting, dressing, reach over head, and hygiene/grooming  PARTICIPATION LIMITATIONS: meal prep, cleaning, laundry, driving, shopping, community activity, and occupation  PERSONAL FACTORS: 3+ comorbidities: see pertinent history  are also affecting patient's functional outcome.   REHAB POTENTIAL: Good  CLINICAL DECISION MAKING: Stable/uncomplicated  EVALUATION COMPLEXITY: Low   GOALS: Goals reviewed with patient? Yes  SHORT TERM GOALS: (target date for Short term goals are 3 weeks 11/24/23)  1.Patient will demonstrate independent use of home exercise program to maintain progress from in clinic treatments. Goal status: MET 11/14/23  LONG TERM GOALS: (target dates for all long term goals are 6 additional weeks  04/15/2024 )   1. Patient will demonstrate/report pain at worst less than or equal to 2/10 to facilitate minimal limitation in daily activity secondary to pain symptoms. Goal status: revised goal date - 03/04/2024   2. Patient will demonstrate independent use of home exercise program to facilitate ability to maintain/progress functional gains from skilled physical therapy services. Goal status  revised goal date - 03/04/2024   3. Patient will demonstrate FOTO outcome > or = 61 % to indicate reduced disability due to condition. Goal status:met 01/18/2024  4.  Patient will demonstrate Rt UE MMT >/= 4/5 throughout to  facilitate lifting, reaching, carrying at [facility] in daily activity.   Goal status: revised goal date - 03/04/2024   5.  Patient will demonstrate Rt GH joint AROM WFL s symptoms to facilitate usual overhead reaching, self care, dressing at PLOF.    Goal status: revised goal date - 03/04/2024   6.  Pt will improve her Rt shoulder flexion to >/= 140 degrees for improvements in ADL's.  Goal status: revised goal date - 03/04/2024  7. Pt will improve her Rt shoulder ER to >/= 60 degrees in order to improved functional actives.   Goal Status: revised goal date - 03/04/2024     PLAN:  PT FREQUENCY: 1x/week  PT DURATION: 6 weeks  PLANNED INTERVENTIONS: Can include 21308- PT Re-evaluation, 97110-Therapeutic exercises, 97530- Therapeutic activity, 97112- Neuromuscular re-education, 97535- Self Care, 97140- Manual therapy, [phone]- Gait training, [phone]- Orthotic Fit/training, [phone]- Canalith repositioning, [id number]- Aquatic Therapy, 97014- Electrical stimulation (unattended), [id number]- Electrical stimulation (manual), [id number]- Vasopneumatic device, [id number]- Ultrasound, [id number]- Traction (mechanical), [id number]- Ionotophoresis 4mg /ml Dexamethasone, Patient/Family education, Balance training, Stair training, Taping, Dry Needling, Joint mobilization, Joint manipulation, Spinal manipulation, Spinal mobilization, Scar mobilization, Vestibular training, Visual/preceptual remediation/compensation, DME instructions, Cryotherapy, and Moist heat.  All performed as medically necessary.  All included unless contraindicated  PLAN FOR NEXT SESSION:  Progressive strength gains, elevation gains.    Chyrel Masson, PT, DPT, OCS, ATC 03/11/24  10:55 AM

## 2024-03-11 NOTE — Telephone Encounter (Signed)
 Pt in for f/u visit today. Not ready to return to work.Marland Kitchen

## 2024-03-11 NOTE — Progress Notes (Signed)
 I, Stevenson Clinch, CMA acting as a scribe for [name], MD.  Misty Bailey is a 53 y.o. female who presents to [facility] at [facility] today for  f/u R shoulder pain. Pt was last seen by Dr. [name] on 01/16/24 and was advised to remain out of work. She has cont'd PT, completing 23 visits.  Today, pt reports some continued right shoulder pain, radiating into the upper arm. PT has been helpful for shoulder sx, though sx have no resolved. Has been practicing typing, notes increased pain in the shoulder and arm while typing and clicking the mouse. Does not feel ready to return to work.   Today, also notes having similar pain in the left arm. Notes decreased ROM reaching back. Denies n/t/w, decreased grip strength.   Dx testing: 06/19/23 C-spine & R shoulder MRI              06/15/23 NCV study 05/24/23 R shoulder & C-spine XR             05/15/23 Labs  Pertinent review of systems: No fevers or chills  Relevant historical information: Right adhesive capsulitis bad enough that required surgery.  History of hidradenitis suppurativa previously well-controlled with Humira.  She lost access to her dermatologist when she lost her job and her health insurance.  Now that that is her back she has not reestablished with dermatology for Humira for hidradenitis suppurativa. No history of diabetes or hypothyroidism.  Exam:  BP 122/86   Pulse 69   Ht 5\' 1"  (1.549 m)   Wt 148 lb (67.1 kg)   SpO2 99%   BMI 27.96 kg/m  General: Well Developed, well nourished, and in no acute distress.   MSK: Left shoulder normal-appearing Limited range of motion abduction and functional internal rotation intact strength.    Lab and Radiology Results  Procedure: Real-time Ultrasound Guided Injection of left shoulder glenohumeral joint posterior approach Device: Philips Affiniti 50G/GE Logiq Images permanently stored and available for review in PACS Verbal informed consent obtained.  Discussed  risks and benefits of procedure. Warned about infection, bleeding, hyperglycemia damage to structures among others. Patient expresses understanding and agreement Time-out conducted.   Noted no overlying erythema, induration, or other signs of local infection.   Skin prepped in a sterile fashion.   Local anesthesia: Topical Ethyl chloride.   With sterile technique and under real time ultrasound guidance: 40 mg of Kenalog and 2 mL of Marcaine injected into glenohumeral joint. Fluid seen entering the joint capsule.   Completed without difficulty   Pain immediately resolved suggesting accurate placement of the medication.   Advised to call if fevers/chills, erythema, induration, drainage, or persistent bleeding.   Images permanently stored and available for review in the ultrasound unit.  Impression: Technically successful ultrasound guided injection.     X-ray images left shoulder obtained today personally and independently interpreted. No severe glenohumeral or AC DJD.  No acute fractures. Await formal radiology review    Assessment and Plan: 53 y.o. female with new left shoulder pain and lack of range of motion occurring in the setting of difficult to manage right shoulder adhesive capsulitis.  Additionally she does have a history of hidradenitis suppurativa that previously was pretty controlled with Humira.  Chief differential diagnosis for the new left shoulder pain is adhesive capsulitis.  I am concerned that she may have an inflammatory condition driving these adhesive capsulitis and would recommend that she get back with her dermatologist to get back  on Humira.  For the shoulder pain and lack of range of motion today we will proceed with a glenohumeral injection on x-ray and continued home exercise program as taught by physical therapy for adhesive capsulitis.  Recheck in 1 month if not improved consider MRI.   PDMP not reviewed this encounter. Orders Placed This Encounter  Procedures    [place] LIMITED JOINT SPACE STRUCTURES UP RIGHT(NO LINKED CHARGES)    Reason for Exam (SYMPTOM  OR DIAGNOSIS REQUIRED):   right shoulder pain    Preferred imaging location?:   [facility] Shoulder Left    Standing Status:   Future    Number of Occurrences:   1    Expiration Date:   04/11/2024    Reason for Exam (SYMPTOM  OR DIAGNOSIS REQUIRED):   left shoulder pain    Preferred imaging location?:   [facility]    Is patient pregnant?:   No   No orders of the defined types were placed in this encounter.    Discussed warning signs or symptoms. Please see discharge instructions. Patient expresses understanding.   The above documentation has been reviewed and is accurate and complete [name], M.D.

## 2024-03-19 ENCOUNTER — Ambulatory Visit: Admitting: Physical Therapy

## 2024-03-19 ENCOUNTER — Encounter: Payer: Self-pay | Admitting: Physical Therapy

## 2024-03-19 DIAGNOSIS — R600 Localized edema: Secondary | ICD-10-CM

## 2024-03-19 DIAGNOSIS — M25511 Pain in right shoulder: Secondary | ICD-10-CM

## 2024-03-19 DIAGNOSIS — M6281 Muscle weakness (generalized): Secondary | ICD-10-CM

## 2024-03-19 NOTE — Therapy (Signed)
 OUTPATIENT PHYSICAL THERAPY TREATMENT  Patient Name: Misty Bailey MRN: [id number] DOB:03/26/1971, 53 y.o., female Today's Date: 03/19/2024  END OF SESSION:  PT End of Session - 03/19/24 1348     Visit Number 25    Number of Visits 28    Date for PT Re-Evaluation 04/15/24    Authorization Type [employer] $40 copay    Progress Note Due on Visit 28    PT Start Time 1302    PT Stop Time 1346    PT Time Calculation (min) 44 min    Activity Tolerance Patient limited by pain    Behavior During Therapy [facility] for tasks assessed/performed                     Past Medical History:  Diagnosis Date   Anemia    Anxiety    Arthritis 02/23/2014   Rib Cage   Asthma    Depression    Diverticulosis    Dysmenorrhea 05/08/2018   Essential hypertension 07/29/2019   HLD (hyperlipidemia)    Panic attacks    Pre-diabetes    Suppurative hidradenitis    Past Surgical History:  Procedure Laterality Date   CHOLECYSTECTOMY N/A 06/28/2023   Procedure: LAPAROSCOPIC CHOLECYSTECTOMY;  Surgeon: Abigail Miyamoto, MD;  Location: [facility] OR;  Service: General;  Laterality: N/A;  TAP BLOCK   INGUINAL HERNIA REPAIR Left 06/28/2023   Procedure: OPEN LEFT INGUINAL HERNIA REPAIR WITH MESH;  Surgeon: Abigail Miyamoto, MD;  Location: [facility] OR;  Service: General;  Laterality: Left;   oral surgery     PELVIC LAPAROSCOPY  1998/1999 `   pelvic adhesions and pain   TOE SURGERY Left    great toe   WISDOM TOOTH EXTRACTION     Patient Active Problem List   Diagnosis Date Noted   Adhesive capsulitis of right shoulder 11/28/2023   B12 deficiency 11/28/2023   Neuropathy 06/22/2023   Inguinal hernia of left side without obstruction or gangrene 04/30/2020   Hyperlipidemia 07/30/2019   Syncope 07/29/2019   Essential hypertension 07/29/2019   Dysmenorrhea 05/08/2018   Herpes 03/21/2014   Suppurative hidradenitis    Depression     PCP: Marcine Matar, MD   REFERRING PROVIDER: Kathryne Hitch*   REFERRING DIAG:  Diagnosis  M24.611 (ICD-10-CM) - Arthrofibrosis of right shoulder  Z98.890 (ICD-10-CM) - Status post arthroscopy of right shoulder    THERAPY DIAG:  Acute pain of right shoulder  Muscle weakness (generalized)  Localized edema  Rationale for Evaluation and Treatment: Rehabilitation  ONSET DATE: February 2024  SUBJECTIVE:  SUBJECTIVE STATEMENT: Pt arriving today reporting 4/10 pain in her Rt shoulder.   PERTINENT HISTORY: Pt s/p on 10/12/23 manipulation c debridement of anterior capsule,  inferior capsule and subacrominal space  PMH: anemia, anxiety, asthma, depression, diverticulosis, essential HTN, panic attacks, pre-diabetes, hernia repair, oral surgery, toe surgery, pelvic laparoscopy, cholecystectomy   PAIN:  NPRS scale:4/10 upon arrival.  Pain location: Rt shoulder- anterior and lateral, some posterior shoulder  Pain description: achy, sore, sharp at times, constant Aggravating factors: lifting my arm, ADL's Relieving factors: resting, pain meds as needed  PRECAUTIONS: None  WEIGHT BEARING RESTRICTIONS: No  FALLS:  Has patient fallen in last 6 months? Yes. Number of falls pt stating no injuries, pt stating she was dehydrated.   LIVING ENVIRONMENT: Lives with: lives with their family and lives alone Lives in: House/apartment Stairs: No Has following equipment at home: None  OCCUPATION: Out of work since May  PLOF: Independent  PATIENT GOALS: be able reach overhead   OBJECTIVE:   DIAGNOSTIC FINDINGS:  10/31/2023 review of chart:  IMPRESSION: 1. Mild tendinosis of the supraspinatus tendon anteriorly. 2. Mild subacromial/subdeltoid bursitis. 3. Mild relative thickening of the inferior joint capsule as can be seen with adhesive capsulitis.  PATIENT  SURVEYS:  01/18/2024:  FOTO update 62  12/18/2023: FOTO update 46%  11/21/23: FOTO update 48%  10/31/23: FOTO intake:  41%      COGNITION: 10/31/2023 Overall cognitive status: WFL     SENSATION: 10/31/2023 Tingling in Rt fingers, pt feels that may be due to B-12 deficiency  POSTURE 10/31/2023 Forward head and rounded shoulders  UPPER EXTREMITY ROM:   ROM (* indicated pain) Right 10/31/23 Left eval Right  PROM 11/07/23 Rt 11/14/23 PROM supine Rt 11/21/23 PROM supine Rt 11/29/2023 AROM Rt 12/05/23 Rt 12/12/23 Rt 12/26/23 supine Rt 01/02/24 supine Rt 01/09/24 supine Right 02/26/2024 Right 03/19/24  Shoulder flexion 60 155 90* 110* 108 110 AA: 120 A: 124 A: 128 A: 125 A: 130 AROM in supine 130   AROM in standing against gravity: 125 deg AA: supine 140   Shoulder extension 15 40             Shoulder abduction 65 162 80* 100*    A: 100 A: 110 A: 112 A: 115 AROM in supine 125 AROM Supine 125  Shoulder adduction               Shoulder internal rotation 50 75 60* 60 *           Shoulder external rotation 25 75 15-20* very guarded and pain limited  30 * 30   A: 50 A: 55 A: 54 A; 55 AROM in supine in 45 deg abduction: 65   (Blank rows = not tested)  UPPER EXTREMITY MMT:  MMT Right 10/31/2023 Left 10/31/2023 Rt 12/18/23 Rt 01/09/24 Right 01/18/2024 Right 03/04/2024  Shoulder flexion 2 5 3  3+ 4/5 4+/5 c pain  Shoulder extension 3 5 3+ 3+    Shoulder abduction 2 5 3  3+ 3+/5 4/5 c pain  Shoulder adduction        Shoulder internal rotation 2 5 3   5/5 5/5  Shoulder external rotation 2 5 3   4/5 4+/5  (Blank rows = not tested)   PALPATION:  10/31/2023 TTP: Rt upper trap, Rt bicep tendons. Rt deltoid, Rt supraspinatus.  TODAY'S TREATMENT:                                                                                             DATE: 03/19/2024:  TherEx: UBE: level 2, x 3 minutes each direction Rows: blue TB 2 x 10 holding 3 sec Standing green band ER 2 x 10 (towel under elbow) Side-lying abduction 1# x 15  Side-lying IR stretch on Left shoulder x 4 holding 10 sec  ThereActivities:  UE ranger flexion x 15 UE ranger scaption x 15 Standing flexion c 1 # bar x 15 focus on eccentric lowering     TODAY'S TREATMENT:                                                                                            DATE: 03/11/2024:  Therex: UBE fwd/back 3 mins each way for ROM loosening to start treamtent,  1 min rest between - lvl 3.0 Standing green band ER walk outs isometric reactives c towel under arm 5 sec hold x 10  Standing green band ER c towel under arm x 10  Supine 90 deg flexion hold with circles cw, ccw 50 x each way 2 lbs  Supine 90 deg flexion protraction punch 3 sec hold 3 lbs x 10  Theract ( to improve reaching, overhead reach, lifting ability of arm for daily activity) UE ranger AAROM flexion with ipsilateral step in 2-3 sec hold x 15 UE ranger AAROM scaption with ipsilateral step in 2-3 sec hold x 15 Standing wand 1lb AAROM flexion with eccentric lowering focus x 15 Standing wand 1 lb GH ext away from buttock x 10  2-3 sec  Vasopneumatic Device Rt shoulder low compression 34 degrees 10 mins in sitting  TODAY'S TREATMENT:                                                                                            DATE: 03/04/2024:  Neuro Re-ed( to improve neural recruitment, postural activation and coordinatoin) Standing blue band rows c scapular retraction focus 2 x 15 Standing blue band GH ext 2 x 15 c focus on movement into extension  UBE fwd/back 3 mins each way for muscle activation. 1 in rest between   Theract ( to improve reaching, overhead reach, lifting ability of arm for daily activity) Pulleys for functional reach flexion, scaption Rt arm with  eccentric lowering focus  3 mins each way, rest required between bouts Standing wand 1lb AAROM flexion with eccentric lowering focus x 15 Standing wand 1 lb GH ext away from buttock x 10  2-3 sec  Vasopneumatic Device Rt shoulder low compression 34 degrees 10 mins in sitting.      PATIENT EDUCATION: 01/18/2024 Education details: HEP update Person educated: Patient Education method: Explanation, Demonstration, Verbal cues, and Handouts Education comprehension: verbalized understanding, returned demonstration, and verbal cues required  HOME EXERCISE PROGRAM: Access Code: [email or web address] URL: [email or web address] Date: 01/18/2024 Prepared by: Chyrel Masson  Exercises - Supine Shoulder External Rotation in 45 Degrees Abduction AAROM with Dowel (Mirrored)  - 2-3 x daily - 7 x weekly - 2 sets - 10 reps - 2 seconds hold - Standing Shoulder Posterior Capsule Stretch (Mirrored)  - 2-3 x daily - 7 x weekly - 1 sets - 3 reps - 15-30 hold - Sleeper Stretch (Mirrored)  - 2-3 x daily - 7 x weekly - 1 sets - 3 reps - 30 hold - Standing Shoulder Row with Anchored Resistance  - 1 x daily - 7 x weekly - 1-2 sets - 10-15 reps - Shoulder Extension with Resistance  - 1 x daily - 7 x weekly - 1-2 sets - 10-15 reps - Shoulder External Rotation Reactive Isometrics (Mirrored)  - 1-2 x daily - 7 x weekly - 1 sets - 10 reps - 5-15 hold - Sidelying Shoulder Abduction Palm Forward  - 1-2 x daily - 7 x weekly - 2-3 sets - 10-15 reps - Sidelying Shoulder Flexion 15 Degrees (Mirrored)  - 1-2 x daily - 7 x weekly - 2-3 sets - 10-15 reps  ASSESSMENT:  CLINICAL IMPRESSION: Pt arriving today reporting 4/10 pain in her Rt shoulder. Pt also reporting pain in left shoulder with extension and adduction. We discussed performing stretching on both UE's. Continue skilled PT toward goals set.     OBJECTIVE IMPAIRMENTS: decreased ROM, decreased strength, increased edema, impaired UE functional use, postural  dysfunction, and pain.   ACTIVITY LIMITATIONS: carrying, sleeping, bathing, toileting, dressing, reach over head, and hygiene/grooming  PARTICIPATION LIMITATIONS: meal prep, cleaning, laundry, driving, shopping, community activity, and occupation  PERSONAL FACTORS: 3+ comorbidities: see pertinent history  are also affecting patient's functional outcome.   REHAB POTENTIAL: Good  CLINICAL DECISION MAKING: Stable/uncomplicated  EVALUATION COMPLEXITY: Low   GOALS: Goals reviewed with patient? Yes  SHORT TERM GOALS: (target date for Short term goals are 3 weeks 11/24/23)  1.Patient will demonstrate independent use of home exercise program to maintain progress from in clinic treatments. Goal status: MET 11/14/23  LONG TERM GOALS: (target dates for all long term goals are 6 additional weeks  04/15/2024 )   1. Patient will demonstrate/report pain at worst less than or equal to 2/10 to facilitate minimal limitation in daily activity secondary to pain symptoms. Goal status: revised goal date - 03/04/2024   2. Patient will demonstrate independent use of home exercise program to facilitate ability to maintain/progress functional gains from skilled physical therapy services. Goal status  revised goal date - 03/04/2024   3. Patient will demonstrate FOTO outcome > or = 61 % to indicate reduced disability due to condition. Goal status:met 01/18/2024   4.  Patient will demonstrate Rt UE MMT >/= 4/5 throughout to facilitate lifting, reaching, carrying at [facility] in daily activity.   Goal status: revised goal date - 03/04/2024   5.  Patient will demonstrate Rt GH joint AROM WFL s symptoms to facilitate usual overhead  reaching, self care, dressing at PLOF.    Goal status: revised goal date - 03/04/2024   6.  Pt will improve her Rt shoulder flexion to >/= 140 degrees for improvements in ADL's.  Goal status: revised goal date - 03/04/2024  7. Pt will improve her Rt shoulder ER to >/= 60 degrees in order  to improved functional actives.   Goal Status: revised goal date - 03/04/2024     PLAN:  PT FREQUENCY: 1x/week  PT DURATION: 6 weeks  PLANNED INTERVENTIONS: Can include 95621- PT Re-evaluation, 97110-Therapeutic exercises, 97530- Therapeutic activity, 97112- Neuromuscular re-education, 97535- Self Care, 97140- Manual therapy, [phone]- Gait training, [phone]- Orthotic Fit/training, [phone]- Canalith repositioning, [id number]- Aquatic Therapy, 97014- Electrical stimulation (unattended), [id number]- Electrical stimulation (manual), [id number]- Vasopneumatic device, [id number]- Ultrasound, [id number]- Traction (mechanical), [id number]- Ionotophoresis 4mg /ml Dexamethasone, Patient/Family education, Balance training, Stair training, Taping, Dry Needling, Joint mobilization, Joint manipulation, Spinal manipulation, Spinal mobilization, Scar mobilization, Vestibular training, Visual/preceptual remediation/compensation, DME instructions, Cryotherapy, and Moist heat.  All performed as medically necessary.  All included unless contraindicated  PLAN FOR NEXT SESSION:  Progressive strength gains, elevation gains.   Narda Amber, PT, MPT 03/19/24 1:53 PM   03/19/24  1:53 PM

## 2024-03-20 ENCOUNTER — Encounter: Payer: Self-pay | Admitting: Orthopaedic Surgery

## 2024-03-20 ENCOUNTER — Ambulatory Visit: Admitting: Orthopaedic Surgery

## 2024-03-20 DIAGNOSIS — M24611 Ankylosis, right shoulder: Secondary | ICD-10-CM

## 2024-03-20 DIAGNOSIS — M7542 Impingement syndrome of left shoulder: Secondary | ICD-10-CM | POA: Diagnosis not present

## 2024-03-20 DIAGNOSIS — Z9889 Other specified postprocedural states: Secondary | ICD-10-CM

## 2024-03-20 NOTE — Progress Notes (Signed)
 The patient is someone who I have seen along with Dr. [name] as a relates to her right shoulder.  We took her to the operating room in October of last year for manipulation under anesthesia and a lysis of adhesions with an arthroscopic intervention of her right shoulder.  She is started to have some pain with her left shoulder and saw Dr. [name] last week and he did place a steroid injection in the left shoulder.  She said her only detriment in range of motion right now mainly just reaching behind her.  I did look at the x-rays that Dr. [name] did of her right shoulder in the office last week.  His x-rays show a normal-appearing shoulder in terms of glenohumeral joint and the subacromial outlet as well as the [facility] joint.  There was no worrisome findings.  On my examination of her right shoulder today she does lack full forward flexion but just barely a few degrees and reaching behind her is very painful but I could push her harder up to the thoracic level.  She was reaching behind her better with her left shoulder.  From my standpoint I will have her just continue physical therapy and anti-inflammatories.  Hopefully the steroid injection that was placed in her shoulder last week will help calm down the inflammatory aspect of her left shoulder.  If things worsen she can always come back and see [place].

## 2024-03-25 ENCOUNTER — Encounter: Admitting: Rehabilitative and Restorative Service Providers"

## 2024-03-25 ENCOUNTER — Encounter: Payer: Self-pay | Admitting: Family Medicine

## 2024-03-25 NOTE — Progress Notes (Signed)
Left shoulder looks normal to radiology.

## 2024-03-27 ENCOUNTER — Encounter: Admitting: Physical Therapy

## 2024-04-03 ENCOUNTER — Encounter: Payer: Self-pay | Admitting: Orthopaedic Surgery

## 2024-04-03 NOTE — Therapy (Signed)
 OUTPATIENT PHYSICAL THERAPY TREATMENT  Patient Name: Misty Bailey MRN: [id number] DOB:1971/07/09, 53 y.o., female Today's Date: 04/03/2024  END OF SESSION:            Past Medical History:  Diagnosis Date   Anemia    Anxiety    Arthritis 02/23/2014   Rib Cage   Asthma    Depression    Diverticulosis    Dysmenorrhea 05/08/2018   Essential hypertension 07/29/2019   HLD (hyperlipidemia)    Panic attacks    Pre-diabetes    Suppurative hidradenitis    Past Surgical History:  Procedure Laterality Date   CHOLECYSTECTOMY N/A 06/28/2023   Procedure: LAPAROSCOPIC CHOLECYSTECTOMY;  Surgeon: Abigail Miyamoto, MD;  Location: [facility] OR;  Service: General;  Laterality: N/A;  TAP BLOCK   INGUINAL HERNIA REPAIR Left 06/28/2023   Procedure: OPEN LEFT INGUINAL HERNIA REPAIR WITH MESH;  Surgeon: Abigail Miyamoto, MD;  Location: [facility] OR;  Service: General;  Laterality: Left;   oral surgery     PELVIC LAPAROSCOPY  1998/1999 `   pelvic adhesions and pain   TOE SURGERY Left    great toe   WISDOM TOOTH EXTRACTION     Patient Active Problem List   Diagnosis Date Noted   Adhesive capsulitis of right shoulder 11/28/2023   B12 deficiency 11/28/2023   Neuropathy 06/22/2023   Inguinal hernia of left side without obstruction or gangrene 04/30/2020   Hyperlipidemia 07/30/2019   Syncope 07/29/2019   Essential hypertension 07/29/2019   Dysmenorrhea 05/08/2018   Herpes 03/21/2014   Suppurative hidradenitis    Depression     PCP: Marcine Matar, MD   REFERRING PROVIDER: Kathryne Hitch*   REFERRING DIAG:  Diagnosis  M24.611 (ICD-10-CM) - Arthrofibrosis of right shoulder  Z98.890 (ICD-10-CM) - Status post arthroscopy of right shoulder    THERAPY DIAG:  No diagnosis found.  Rationale for Evaluation and Treatment: Rehabilitation  ONSET DATE: February 2024  SUBJECTIVE:                                                                                                                                                                                       SUBJECTIVE STATEMENT: Pt arriving today reporting 4/10 pain in her Rt shoulder.   PERTINENT HISTORY: Pt s/p on 10/12/23 manipulation c debridement of anterior capsule,  inferior capsule and subacrominal space  PMH: anemia, anxiety, asthma, depression, diverticulosis, essential HTN, panic attacks, pre-diabetes, hernia repair, oral surgery, toe surgery, pelvic laparoscopy, cholecystectomy   PAIN:  NPRS scale:4/10 upon arrival.  Pain location: Rt shoulder- anterior and lateral, some posterior shoulder  Pain description: achy, sore, sharp at times, constant Aggravating factors: lifting my  arm, ADL's Relieving factors: resting, pain meds as needed  PRECAUTIONS: None  WEIGHT BEARING RESTRICTIONS: No  FALLS:  Has patient fallen in last 6 months? Yes. Number of falls pt stating no injuries, pt stating she was dehydrated.   LIVING ENVIRONMENT: Lives with: lives with their family and lives alone Lives in: House/apartment Stairs: No Has following equipment at home: None  OCCUPATION: Out of work since May  PLOF: Independent  PATIENT GOALS: be able reach overhead   OBJECTIVE:   DIAGNOSTIC FINDINGS:  10/31/2023 review of chart:  IMPRESSION: 1. Mild tendinosis of the supraspinatus tendon anteriorly. 2. Mild subacromial/subdeltoid bursitis. 3. Mild relative thickening of the inferior joint capsule as can be seen with adhesive capsulitis.  PATIENT SURVEYS:  01/18/2024:  FOTO update 62  12/18/2023: FOTO update 46%  11/21/23: FOTO update 48%  10/31/23: FOTO intake:  41%      COGNITION: 10/31/2023 Overall cognitive status: WFL     SENSATION: 10/31/2023 Tingling in Rt fingers, pt feels that may be due to B-12 deficiency  POSTURE 10/31/2023 Forward head and rounded shoulders  UPPER EXTREMITY ROM:   ROM (* indicated pain) Right 10/31/23 Left eval Right  PROM 11/07/23 Rt 11/14/23 PROM supine  Rt 11/21/23 PROM supine Rt 11/29/2023 AROM Rt 12/05/23 Rt 12/12/23 Rt 12/26/23 supine Rt 01/02/24 supine Rt 01/09/24 supine Right 02/26/2024 Right 03/19/24  Shoulder flexion 60 155 90* 110* 108 110 AA: 120 A: 124 A: 128 A: 125 A: 130 AROM in supine 130   AROM in standing against gravity: 125 deg AA: supine 140   Shoulder extension 15 40             Shoulder abduction 65 162 80* 100*    A: 100 A: 110 A: 112 A: 115 AROM in supine 125 AROM Supine 125  Shoulder adduction               Shoulder internal rotation 50 75 60* 60 *           Shoulder external rotation 25 75 15-20* very guarded and pain limited  30 * 30   A: 50 A: 55 A: 54 A; 55 AROM in supine in 45 deg abduction: 65   (Blank rows = not tested)  UPPER EXTREMITY MMT:  MMT Right 10/31/2023 Left 10/31/2023 Rt 12/18/23 Rt 01/09/24 Right 01/18/2024 Right 03/04/2024  Shoulder flexion 2 5 3  3+ 4/5 4+/5 c pain  Shoulder extension 3 5 3+ 3+    Shoulder abduction 2 5 3  3+ 3+/5 4/5 c pain  Shoulder adduction        Shoulder internal rotation 2 5 3   5/5 5/5  Shoulder external rotation 2 5 3   4/5 4+/5  (Blank rows = not tested)   PALPATION:  10/31/2023 TTP: Rt upper trap, Rt bicep tendons. Rt deltoid, Rt supraspinatus.  TODAY'S TREATMENT:                                                                                            DATE: 04/04/2024:  Therex: UBE fwd/back 3 mins each way for ROM loosening to start treamtent,  1 min rest between - lvl 3.0 Standing green band ER walk outs isometric reactives c towel under arm 5 sec hold x 10  Standing green band ER c towel under arm x 10  Supine 90 deg flexion hold with circles cw, ccw 50 x each way 2 lbs  Supine 90 deg flexion protraction punch 3 sec hold 3 lbs x 10  Theract ( to improve  reaching, overhead reach, lifting ability of arm for daily activity) UE ranger AAROM flexion with ipsilateral step in 2-3 sec hold x 15 UE ranger AAROM scaption with ipsilateral step in 2-3 sec hold x 15 Standing wand 1lb AAROM flexion with eccentric lowering focus x 15 Standing wand 1 lb GH ext away from buttock x 10  2-3 sec  Vasopneumatic Device Rt shoulder low compression 34 degrees 10 mins in sitting  TODAY'S TREATMENT:                                                                                            DATE: 03/19/2024:  TherEx: UBE: level 2, x 3 minutes each direction Rows: blue TB 2 x 10 holding 3 sec Standing green band ER 2 x 10 (towel under elbow) Side-lying abduction 1# x 15  Side-lying IR stretch on Left shoulder x 4 holding 10 sec  ThereActivities:  UE ranger flexion x 15 UE ranger scaption x 15 Standing flexion c 1 # bar x 15 focus on eccentric lowering     TODAY'S TREATMENT:                                                                                            DATE: 03/11/2024:  Therex: UBE fwd/back 3 mins each way for ROM loosening to start treamtent,  1 min rest between - lvl 3.0 Standing green band ER walk outs isometric reactives c towel under arm 5 sec hold x 10  Standing green band ER c towel under arm x 10  Supine 90 deg flexion hold with circles cw, ccw 50 x each way 2 lbs  Supine 90 deg flexion protraction punch 3 sec hold 3 lbs x  10  Theract ( to improve reaching, overhead reach, lifting ability of arm for daily activity) UE ranger AAROM flexion with ipsilateral step in 2-3 sec hold x 15 UE ranger AAROM scaption with ipsilateral step in 2-3 sec hold x 15 Standing wand 1lb AAROM flexion with eccentric lowering focus x 15 Standing wand 1 lb GH ext away from buttock x 10  2-3 sec  Vasopneumatic Device Rt shoulder low compression 34 degrees 10 mins in sitting  TODAY'S TREATMENT:                                                                                             DATE: 03/04/2024:  Neuro Re-ed( to improve neural recruitment, postural activation and coordinatoin) Standing blue band rows c scapular retraction focus 2 x 15 Standing blue band GH ext 2 x 15 c focus on movement into extension  UBE fwd/back 3 mins each way for muscle activation. 1 in rest between   Theract ( to improve reaching, overhead reach, lifting ability of arm for daily activity) Pulleys for functional reach flexion, scaption Rt arm with eccentric lowering focus 3 mins each way, rest required between bouts Standing wand 1lb AAROM flexion with eccentric lowering focus x 15 Standing wand 1 lb GH ext away from buttock x 10  2-3 sec  Vasopneumatic Device Rt shoulder low compression 34 degrees 10 mins in sitting.      PATIENT EDUCATION: 01/18/2024 Education details: HEP update Person educated: Patient Education method: Explanation, Demonstration, Verbal cues, and Handouts Education comprehension: verbalized understanding, returned demonstration, and verbal cues required  HOME EXERCISE PROGRAM: Access Code: [email or web address] URL: [email or web address] Date: 01/18/2024 Prepared by: Chyrel Masson  Exercises - Supine Shoulder External Rotation in 45 Degrees Abduction AAROM with Dowel (Mirrored)  - 2-3 x daily - 7 x weekly - 2 sets - 10 reps - 2 seconds hold - Standing Shoulder Posterior Capsule Stretch (Mirrored)  - 2-3 x daily - 7 x weekly - 1 sets - 3 reps - 15-30 hold - Sleeper Stretch (Mirrored)  - 2-3 x daily - 7 x weekly - 1 sets - 3 reps - 30 hold - Standing Shoulder Row with Anchored Resistance  - 1 x daily - 7 x weekly - 1-2 sets - 10-15 reps - Shoulder Extension with Resistance  - 1 x daily - 7 x weekly - 1-2 sets - 10-15 reps - Shoulder External Rotation Reactive Isometrics (Mirrored)  - 1-2 x daily - 7 x weekly - 1 sets - 10 reps - 5-15 hold - Sidelying Shoulder Abduction Palm Forward  - 1-2 x daily - 7 x weekly - 2-3 sets - 10-15 reps -  Sidelying Shoulder Flexion 15 Degrees (Mirrored)  - 1-2 x daily - 7 x weekly - 2-3 sets - 10-15 reps  ASSESSMENT:  CLINICAL IMPRESSION: Pt arriving today reporting 4/10 pain in her Rt shoulder. Pt also reporting pain in left shoulder with extension and adduction. We discussed performing stretching on both UE's. Continue skilled PT toward goals set.     OBJECTIVE IMPAIRMENTS: decreased ROM, decreased strength, increased edema, impaired UE functional use, postural dysfunction, and pain.  ACTIVITY LIMITATIONS: carrying, sleeping, bathing, toileting, dressing, reach over head, and hygiene/grooming  PARTICIPATION LIMITATIONS: meal prep, cleaning, laundry, driving, shopping, community activity, and occupation  PERSONAL FACTORS: 3+ comorbidities: see pertinent history  are also affecting patient's functional outcome.   REHAB POTENTIAL: Good  CLINICAL DECISION MAKING: Stable/uncomplicated  EVALUATION COMPLEXITY: Low   GOALS: Goals reviewed with patient? Yes  SHORT TERM GOALS: (target date for Short term goals are 3 weeks 11/24/23)  1.Patient will demonstrate independent use of home exercise program to maintain progress from in clinic treatments. Goal status: MET 11/14/23  LONG TERM GOALS: (target dates for all long term goals are 6 additional weeks  04/15/2024 )   1. Patient will demonstrate/report pain at worst less than or equal to 2/10 to facilitate minimal limitation in daily activity secondary to pain symptoms. Goal status: revised goal date - 03/04/2024   2. Patient will demonstrate independent use of home exercise program to facilitate ability to maintain/progress functional gains from skilled physical therapy services. Goal status  revised goal date - 03/04/2024   3. Patient will demonstrate FOTO outcome > or = 61 % to indicate reduced disability due to condition. Goal status:met 01/18/2024   4.  Patient will demonstrate Rt UE MMT >/= 4/5 throughout to facilitate lifting,  reaching, carrying at [facility] in daily activity.   Goal status: revised goal date - 03/04/2024   5.  Patient will demonstrate Rt GH joint AROM WFL s symptoms to facilitate usual overhead reaching, self care, dressing at PLOF.    Goal status: revised goal date - 03/04/2024   6.  Pt will improve her Rt shoulder flexion to >/= 140 degrees for improvements in ADL's.  Goal status: revised goal date - 03/04/2024  7. Pt will improve her Rt shoulder ER to >/= 60 degrees in order to improved functional actives.   Goal Status: revised goal date - 03/04/2024     PLAN:  PT FREQUENCY: 1x/week  PT DURATION: 6 weeks  PLANNED INTERVENTIONS: Can include 16109- PT Re-evaluation, 97110-Therapeutic exercises, 97530- Therapeutic activity, 97112- Neuromuscular re-education, 97535- Self Care, 97140- Manual therapy, [phone]- Gait training, [phone]- Orthotic Fit/training, [phone]- Canalith repositioning, [id number]- Aquatic Therapy, 97014- Electrical stimulation (unattended), [id number]- Electrical stimulation (manual), [id number]- Vasopneumatic device, [id number]- Ultrasound, [id number]- Traction (mechanical), [id number]- Ionotophoresis 4mg /ml Dexamethasone, Patient/Family education, Balance training, Stair training, Taping, Dry Needling, Joint mobilization, Joint manipulation, Spinal manipulation, Spinal mobilization, Scar mobilization, Vestibular training, Visual/preceptual remediation/compensation, DME instructions, Cryotherapy, and Moist heat.  All performed as medically necessary.  All included unless contraindicated  PLAN FOR NEXT SESSION:  Progressive strength gains, elevation gains.    Chyrel Masson, PT, DPT, OCS, ATC 04/03/24  4:07 PM

## 2024-04-03 NOTE — Telephone Encounter (Signed)
 Forwarding to Dr. [name] to review and advise. I will keep a look out for the leave forms.

## 2024-04-04 ENCOUNTER — Ambulatory Visit: Admitting: Rehabilitative and Restorative Service Providers"

## 2024-04-04 ENCOUNTER — Encounter: Payer: Self-pay | Admitting: Rehabilitative and Restorative Service Providers"

## 2024-04-04 DIAGNOSIS — R600 Localized edema: Secondary | ICD-10-CM | POA: Diagnosis not present

## 2024-04-04 DIAGNOSIS — M25511 Pain in right shoulder: Secondary | ICD-10-CM | POA: Diagnosis not present

## 2024-04-04 DIAGNOSIS — M6281 Muscle weakness (generalized): Secondary | ICD-10-CM

## 2024-04-05 NOTE — Telephone Encounter (Signed)
 Misty Bailey is looking out for those forms.  I think keeping your appointment on the 21st is a good idea.

## 2024-04-09 NOTE — Telephone Encounter (Signed)
 Forms have been received. F/U visit scheduled 04/15/24 .

## 2024-04-11 ENCOUNTER — Ambulatory Visit: Admitting: Rehabilitative and Restorative Service Providers"

## 2024-04-11 ENCOUNTER — Encounter: Payer: Self-pay | Admitting: Rehabilitative and Restorative Service Providers"

## 2024-04-11 DIAGNOSIS — M6281 Muscle weakness (generalized): Secondary | ICD-10-CM | POA: Diagnosis not present

## 2024-04-11 DIAGNOSIS — R600 Localized edema: Secondary | ICD-10-CM

## 2024-04-11 DIAGNOSIS — M25511 Pain in right shoulder: Secondary | ICD-10-CM | POA: Diagnosis not present

## 2024-04-11 NOTE — Therapy (Signed)
 OUTPATIENT PHYSICAL THERAPY TREATMENT  Patient Name: Misty Bailey MRN: [id number] DOB:04/17/1971, 53 y.o., female Today's Date: 04/11/2024  END OF SESSION:  PT End of Session - 04/11/24 0804     Visit Number 27    Number of Visits 28    Date for PT Re-Evaluation 04/15/24    Authorization Type [employer] $40 copay    Progress Note Due on Visit 28    PT Start Time 0801    PT Stop Time 0850    PT Time Calculation (min) 49 min    Activity Tolerance Patient limited by fatigue    Behavior During Therapy [facility] for tasks assessed/performed                       Past Medical History:  Diagnosis Date   Anemia    Anxiety    Arthritis 02/23/2014   Rib Cage   Asthma    Depression    Diverticulosis    Dysmenorrhea 05/08/2018   Essential hypertension 07/29/2019   HLD (hyperlipidemia)    Panic attacks    Pre-diabetes    Suppurative hidradenitis    Past Surgical History:  Procedure Laterality Date   CHOLECYSTECTOMY N/A 06/28/2023   Procedure: LAPAROSCOPIC CHOLECYSTECTOMY;  Surgeon: Oza Blumenthal, MD;  Location: [facility] OR;  Service: General;  Laterality: N/A;  TAP BLOCK   INGUINAL HERNIA REPAIR Left 06/28/2023   Procedure: OPEN LEFT INGUINAL HERNIA REPAIR WITH MESH;  Surgeon: Oza Blumenthal, MD;  Location: [facility] OR;  Service: General;  Laterality: Left;   oral surgery     PELVIC LAPAROSCOPY  1998/1999 `   pelvic adhesions and pain   TOE SURGERY Left    great toe   WISDOM TOOTH EXTRACTION     Patient Active Problem List   Diagnosis Date Noted   Adhesive capsulitis of right shoulder 11/28/2023   B12 deficiency 11/28/2023   Neuropathy 06/22/2023   Inguinal hernia of left side without obstruction or gangrene 04/30/2020   Hyperlipidemia 07/30/2019   Syncope 07/29/2019   Essential hypertension 07/29/2019   Dysmenorrhea 05/08/2018   Herpes 03/21/2014   Suppurative hidradenitis    Depression     PCP: Lawrance Presume, MD   REFERRING PROVIDER: Arnie Lao*   REFERRING DIAG:  Diagnosis  M24.611 (ICD-10-CM) - Arthrofibrosis of right shoulder  Z98.890 (ICD-10-CM) - Status post arthroscopy of right shoulder    THERAPY DIAG:  Acute pain of right shoulder  Muscle weakness (generalized)  Localized edema  Rationale for Evaluation and Treatment: Rehabilitation  ONSET DATE: February 2024  SUBJECTIVE:  SUBJECTIVE STATEMENT: Pt indicated feeling better, reported 4/10 for pain.  Reported less tightness.   PERTINENT HISTORY: Pt s/p on 10/12/23 manipulation c debridement of anterior capsule,  inferior capsule and subacrominal space  PMH: anemia, anxiety, asthma, depression, diverticulosis, essential HTN, panic attacks, pre-diabetes, hernia repair, oral surgery, toe surgery, pelvic laparoscopy, cholecystectomy   PAIN:  NPRS scale: 4.5/10 upon arrival  Pain location: Rt shoulder Pain description: achy, sore, sharp at times, constant Aggravating factors: lifting my arm, ADL's Relieving factors: resting, pain meds as needed  PRECAUTIONS: None  WEIGHT BEARING RESTRICTIONS: No  FALLS:  Has patient fallen in last 6 months? Yes. Number of falls pt stating no injuries, pt stating she was dehydrated.   LIVING ENVIRONMENT: Lives with: lives with their family and lives alone Lives in: House/apartment Stairs: No Has following equipment at home: None  OCCUPATION: Out of work since May  PLOF: Independent  PATIENT GOALS: be able reach overhead   OBJECTIVE:   DIAGNOSTIC FINDINGS:  10/31/2023 review of chart:  IMPRESSION: 1. Mild tendinosis of the supraspinatus tendon anteriorly. 2. Mild subacromial/subdeltoid bursitis. 3. Mild relative thickening of the inferior joint capsule as can be seen with adhesive capsulitis.  PATIENT SURVEYS:   01/18/2024:  FOTO update 62  12/18/2023: FOTO update 46%  11/21/23: FOTO update 48%  10/31/23: FOTO intake:  41%      COGNITION: 10/31/2023 Overall cognitive status: WFL     SENSATION: 10/31/2023 Tingling in Rt fingers, pt feels that may be due to B-12 deficiency  POSTURE 10/31/2023 Forward head and rounded shoulders  UPPER EXTREMITY ROM:   ROM (* indicated pain) Right 10/31/23 Left eval Right  PROM 11/07/23 Rt 11/14/23 PROM supine Rt 11/21/23 PROM supine Rt 11/29/2023 AROM Rt 12/05/23 Rt 12/12/23 Rt 12/26/23 supine Rt 01/02/24 supine Rt 01/09/24 supine Right 02/26/2024 Right 03/19/24  Shoulder flexion 60 155 90* 110* 108 110 AA: 120 A: 124 A: 128 A: 125 A: 130 AROM in supine 130   AROM in standing against gravity: 125 deg AA: supine 140   Shoulder extension 15 40             Shoulder abduction 65 162 80* 100*    A: 100 A: 110 A: 112 A: 115 AROM in supine 125 AROM Supine 125  Shoulder adduction               Shoulder internal rotation 50 75 60* 60 *           Shoulder external rotation 25 75 15-20* very guarded and pain limited  30 * 30   A: 50 A: 55 A: 54 A; 55 AROM in supine in 45 deg abduction: 65   (Blank rows = not tested)  UPPER EXTREMITY MMT:  MMT Right 10/31/2023 Left 10/31/2023 Rt 12/18/23 Rt 01/09/24 Right 01/18/2024 Right 03/04/2024 Right 04/04/2024  Left 04/04/2024  Shoulder flexion 2 5 3  3+ 4/5 4+/5 c pain 5/5 5/5  Shoulder extension 3 5 3+ 3+      Shoulder abduction 2 5 3  3+ 3+/5 4/5 c pain 4+/5 5/5  Shoulder adduction          Shoulder internal rotation 2 5 3   5/5 5/5 5/5 5/5  Shoulder external rotation 2 5 3   4/5 4+/5 4/5 5/5  (Blank rows = not tested)   PALPATION:  04/11/2024:  Trigger points, tightness posterior capsule, infraspinatus.  10/31/2023 TTP: Rt upper trap, Rt bicep tendons. Rt deltoid, Rt supraspinatus.  TODAY'S TREATMENT:                                                                                            DATE: 04/11/2024:  Therex: UBE fwd/back 3 mins each way lvl 3.0 with 1 min rest break Cross arm stretch in supine Rt shoulder 15 sec x 5   TherActivity (to improve functional reaching/movement)  Standing Rt shoulder flexion eccentric only full range x 15 0 lb, x 15 1lb . Additional time for slow control movement focus.    Neuro Re-ed (to improve muscle activation, postural awareness/endurance/control) Stabilizations holds against moderate external resistance 20-30 sec bouts in 90 deg flexion Rt shoulder  Standing shoulder 90 deg flexion  2 lb weight ball circles 20 x 2 cw, ccw each - performed bilaterally  Standing tband rows blue band c scapular retraction focus 2 x 15   Manual: Percussive device to Rt posterior shoulder to help improve rotation mobility tolerance (ER/IR).     Vasopneumatic Device Rt shoulder low compression 34 degrees 10 mins in sitting  TODAY'S TREATMENT:                                                                                            DATE: 04/04/2024:  Therex: Pulleys flexion, scaption 5 sec hold to tolerance bilateral UE 3 mins each way  Sidelying Rt shoulder abduction AROM x 10  Verbal review of existing HEP routine.   Neuro Re-ed (to improve muscle activation, postural awareness/endurance/control) Tband green GH ext 3 sec hold past leg x 15  Standing green band ER walk outs 5 sec reactive isometric hold with towel under arm x 15 bilateral  Standing green band rows c scapular retraction focus 2 x 15   Manual: Percussive device to Rt posterior shoulder to help improve rotation mobility tolerance (ER/IR).   After manual, improvement in symptoms with IR improved 20 deg post treatment.   Vasopneumatic Device Rt shoulder low compression 34 degrees 10 mins in  sitting  TODAY'S TREATMENT:                                                                                            DATE: 03/19/2024:  TherEx: UBE: level 2, x 3 minutes each direction Rows: blue TB 2 x 10 holding 3 sec Standing green band ER 2 x 10 (towel under elbow) Side-lying abduction 1# x 15  Side-lying  IR stretch on Left shoulder x 4 holding 10 sec  ThereActivities:  UE ranger flexion x 15 UE ranger scaption x 15 Standing flexion c 1 # bar x 15 focus on eccentric lowering     TODAY'S TREATMENT:                                                                                            DATE: 03/11/2024:  Therex: UBE fwd/back 3 mins each way for ROM loosening to start treamtent,  1 min rest between - lvl 3.0 Standing green band ER walk outs isometric reactives c towel under arm 5 sec hold x 10  Standing green band ER c towel under arm x 10  Supine 90 deg flexion hold with circles cw, ccw 50 x each way 2 lbs  Supine 90 deg flexion protraction punch 3 sec hold 3 lbs x 10  Theract ( to improve reaching, overhead reach, lifting ability of arm for daily activity) UE ranger AAROM flexion with ipsilateral step in 2-3 sec hold x 15 UE ranger AAROM scaption with ipsilateral step in 2-3 sec hold x 15 Standing wand 1lb AAROM flexion with eccentric lowering focus x 15 Standing wand 1 lb GH ext away from buttock x 10  2-3 sec  Vasopneumatic Device Rt shoulder low compression 34 degrees 10 mins in sitting  TODAY'S TREATMENT:                                                                                            DATE: 03/04/2024:  Neuro Re-ed( to improve neural recruitment, postural activation and coordinatoin) Standing blue band rows c scapular retraction focus 2 x 15 Standing blue band GH ext 2 x 15 c focus on movement into extension  UBE fwd/back 3 mins each way for muscle activation. 1 in rest between   Theract ( to improve reaching, overhead reach, lifting ability of arm for daily  activity) Pulleys for functional reach flexion, scaption Rt arm with eccentric lowering focus 3 mins each way, rest required between bouts Standing wand 1lb AAROM flexion with eccentric lowering focus x 15 Standing wand 1 lb GH ext away from buttock x 10  2-3 sec  Vasopneumatic Device Rt shoulder low compression 34 degrees 10 mins in sitting.    PATIENT EDUCATION: 01/18/2024 Education details: HEP update Person educated: Patient Education method: Explanation, Demonstration, Verbal cues, and Handouts Education comprehension: verbalized understanding, returned demonstration, and verbal cues required  HOME EXERCISE PROGRAM: Access Code: [email or web address] URL: [email or web address] Date: 01/18/2024 Prepared by: Chyrel Masson  Exercises - Supine Shoulder External Rotation in 45 Degrees Abduction AAROM with Dowel (Mirrored)  - 2-3 x daily - 7 x weekly - 2 sets - 10 reps - 2 seconds hold - Standing Shoulder Posterior Capsule Stretch (  Mirrored)  - 2-3 x daily - 7 x weekly - 1 sets - 3 reps - 15-30 hold - Sleeper Stretch (Mirrored)  - 2-3 x daily - 7 x weekly - 1 sets - 3 reps - 30 hold - Standing Shoulder Row with Anchored Resistance  - 1 x daily - 7 x weekly - 1-2 sets - 10-15 reps - Shoulder Extension with Resistance  - 1 x daily - 7 x weekly - 1-2 sets - 10-15 reps - Shoulder External Rotation Reactive Isometrics (Mirrored)  - 1-2 x daily - 7 x weekly - 1 sets - 10 reps - 5-15 hold - Sidelying Shoulder Abduction Palm Forward  - 1-2 x daily - 7 x weekly - 2-3 sets - 10-15 reps - Sidelying Shoulder Flexion 15 Degrees (Mirrored)  - 1-2 x daily - 7 x weekly - 2-3 sets - 10-15 reps  ASSESSMENT:  CLINICAL IMPRESSION: Pt has attended 27 visits overall with improvements noted in objective data.  Continued mild to moderate pain levels noted despite gains in ability.  Pt may continue to benefit from skilled PT services c paired HEP to continue to address mobility restriction and strength  deficits but may be appropriate for HEP transitioning soon pending comfort level and retesting on objective data.     OBJECTIVE IMPAIRMENTS: decreased ROM, decreased strength, increased edema, impaired UE functional use, postural dysfunction, and pain.   ACTIVITY LIMITATIONS: carrying, sleeping, bathing, toileting, dressing, reach over head, and hygiene/grooming  PARTICIPATION LIMITATIONS: meal prep, cleaning, laundry, driving, shopping, community activity, and occupation  PERSONAL FACTORS: 3+ comorbidities: see pertinent history  are also affecting patient's functional outcome.   REHAB POTENTIAL: Good  CLINICAL DECISION MAKING: Stable/uncomplicated  EVALUATION COMPLEXITY: Low   GOALS: Goals reviewed with patient? Yes  SHORT TERM GOALS: (target date for Short term goals are 3 weeks 11/24/23)  1.Patient will demonstrate independent use of home exercise program to maintain progress from in clinic treatments. Goal status: MET 11/14/23  LONG TERM GOALS: (target dates for all long term goals are 6 additional weeks  04/15/2024 )   1. Patient will demonstrate/report pain at worst less than or equal to 2/10 to facilitate minimal limitation in daily activity secondary to pain symptoms. Goal status: revised goal date - 03/04/2024   2. Patient will demonstrate independent use of home exercise program to facilitate ability to maintain/progress functional gains from skilled physical therapy services. Goal status  revised goal date - 03/04/2024   3. Patient will demonstrate FOTO outcome > or = 61 % to indicate reduced disability due to condition. Goal status:met 01/18/2024   4.  Patient will demonstrate Rt UE MMT >/= 4/5 throughout to facilitate lifting, reaching, carrying at [facility] in daily activity.   Goal status: revised goal date - 03/04/2024   5.  Patient will demonstrate Rt GH joint AROM WFL s symptoms to facilitate usual overhead reaching, self care, dressing at PLOF.    Goal status:  revised goal date - 03/04/2024   6.  Pt will improve her Rt shoulder flexion to >/= 140 degrees for improvements in ADL's.  Goal status: revised goal date - 03/04/2024  7. Pt will improve her Rt shoulder ER to >/= 60 degrees in order to improved functional actives.   Goal Status: revised goal date - 03/04/2024     PLAN:  PT FREQUENCY: 1x/week  PT DURATION: 6 weeks  PLANNED INTERVENTIONS: Can include 16109- PT Re-evaluation, 97110-Therapeutic exercises, 97530- Therapeutic activity, [id number]- Neuromuscular re-education, 97535- Self Care, 97140-  Manual therapy, [id number]- Gait training, 30865- Orthotic Fit/training, 78469- Canalith repositioning, 62952- Aquatic Therapy, 97014- Electrical stimulation (unattended), [id number]- Electrical stimulation (manual), [id number]- Vasopneumatic device, [id number]- Ultrasound, [id number]- Traction (mechanical), [id number]- Ionotophoresis 4mg /ml Dexamethasone, Patient/Family education, Balance training, Stair training, Taping, Dry Needling, Joint mobilization, Joint manipulation, Spinal manipulation, Spinal mobilization, Scar mobilization, Vestibular training, Visual/preceptual remediation/compensation, DME instructions, Cryotherapy, and Moist heat.  All performed as medically necessary.  All included unless contraindicated  PLAN FOR NEXT SESSION: 1 more visit per POC with recert date required 04/15/2024.  HEP transitioning or continued care?   Bonna Bustard, PT, DPT, OCS, ATC 04/11/24  8:52 AM

## 2024-04-11 NOTE — Progress Notes (Signed)
   [name], PhD, LAT, ATC acting as a scribe for Garlan Juniper, MD.  Misty Bailey is a 53 y.o. female who presents to [facility] at [facility] today for f/u bilat shoulder pain. Pt was last seen by Dr. Alease Hunter on 03/11/24 and was given a L GH steroid injection and advised to f/u w/ dermatologist about possibly getting back on Humira. She has cont'd PT, completing 27 visits.  Today, pt reports she has avoided reaching out to her derm do to an outstanding bill. Pt got a few days of relief from prior CSI. She rates her L shoulder pain a 4/10 daily. Pt notes increased AROM. R shoulder is still in the post-op period, but improving.   Dx testing: 03/11/24 L shoulder XR 06/19/23 C-spine & R shoulder MRI              06/15/23 NCV study 05/24/23 R shoulder & C-spine XR             05/15/23 Labs  Pertinent review of systems: No fevers or chills  Relevant historical information: Hypertension and history of adhesive capsulitis.  History of hidradenitis suppurativa   Exam:  BP (!) 164/98   Pulse 62   Ht 5\' 1"  (1.549 m)   Wt 151 lb (68.5 kg)   SpO2 98%   BMI 28.53 kg/m  General: Well Developed, well nourished, and in no acute distress.   MSK: Left shoulder decreased range of motion lacks full external rotation and abduction.  Lacks full internal rotation.  Similar right shoulder also lacking full range of motion.       Assessment and Plan: 53 y.o. female with chronic bilateral shoulder pain due to adhesive capsulitis.  Improving with steroid injection and PT.  Can repeat steroid injection if needed in the middle part of June which will be about 2 months.  Plan to check back then.  She is still out of work given lack of full range of motion.  Anticipating return to work August or June.  Will have a firmer idea of return to work at the next visit.  I do think she has an inflammatory condition promoting adhesive capsulitis.  She previously was on Humira for hidradenitis  suppurativa.  Refer her to a different rheumatologist when affiliated with [facility] health.  Previous dermatologist was [facility] dermatology.  This may make it a little easier for her to get back on Humira.  I think Humira probably would help adhesive capsulitis and her hidradenitis suppurativa.   PDMP not reviewed this encounter. Orders Placed This Encounter  Procedures   Ambulatory referral to Dermatology    Referral Priority:   Routine    Referral Type:   Consultation    Referral Reason:   Specialty Services Required    Referred to Provider:   Dellar Fenton, DO    Requested Specialty:   Dermatology    Number of Visits Requested:   1   No orders of the defined types were placed in this encounter.    Discussed warning signs or symptoms. Please see discharge instructions. Patient expresses understanding.   The above documentation has been reviewed and is accurate and complete Garlan Juniper, M.D.

## 2024-04-15 ENCOUNTER — Ambulatory Visit: Admitting: Family Medicine

## 2024-04-15 VITALS — BP 164/98 | HR 62 | Ht 61.0 in | Wt 151.0 lb

## 2024-04-15 DIAGNOSIS — M25512 Pain in left shoulder: Secondary | ICD-10-CM | POA: Diagnosis not present

## 2024-04-15 DIAGNOSIS — L732 Hidradenitis suppurativa: Secondary | ICD-10-CM

## 2024-04-15 DIAGNOSIS — G8929 Other chronic pain: Secondary | ICD-10-CM

## 2024-04-15 NOTE — Patient Instructions (Addendum)
 Thank you for coming in today.   Keep work on exercises.   Recheck in 2 months.   Anticipate RTW August.   You should hear from Dermatology soon. Let me know if you dont.

## 2024-04-15 NOTE — Telephone Encounter (Signed)
 Forms have been received.

## 2024-04-17 ENCOUNTER — Ambulatory Visit (INDEPENDENT_AMBULATORY_CARE_PROVIDER_SITE_OTHER): Admitting: Rehabilitative and Restorative Service Providers"

## 2024-04-17 DIAGNOSIS — M6281 Muscle weakness (generalized): Secondary | ICD-10-CM | POA: Diagnosis not present

## 2024-04-17 DIAGNOSIS — R600 Localized edema: Secondary | ICD-10-CM

## 2024-04-17 DIAGNOSIS — M25511 Pain in right shoulder: Secondary | ICD-10-CM

## 2024-04-17 NOTE — Therapy (Signed)
 OUTPATIENT PHYSICAL THERAPY TREATMENT/ PROGRESS NOTE/ RECERT  Patient Name: Misty Bailey MRN: [id number] DOB:1971-06-17, 53 y.o., female Today's Date: 04/17/2024  Progress Note Reporting Period 3/102025 to 04/17/2024  See note below for Objective Data and Assessment of Progress/Goals.      END OF SESSION:  PT End of Session - 04/17/24 0854     Visit Number 28    Number of Visits 35    Date for PT Re-Evaluation 06/12/24    Authorization Type [employer] $40 copay    Progress Note Due on Visit 28    PT Start Time 0850    PT Stop Time 0929    PT Time Calculation (min) 39 min    Activity Tolerance Patient limited by fatigue    Behavior During Therapy [facility] for tasks assessed/performed               Past Medical History:  Diagnosis Date   Anemia    Anxiety    Arthritis 02/23/2014   Rib Cage   Asthma    Depression    Diverticulosis    Dysmenorrhea 05/08/2018   Essential hypertension 07/29/2019   HLD (hyperlipidemia)    Panic attacks    Pre-diabetes    Suppurative hidradenitis    Past Surgical History:  Procedure Laterality Date   CHOLECYSTECTOMY N/A 06/28/2023   Procedure: LAPAROSCOPIC CHOLECYSTECTOMY;  Surgeon: Oza Blumenthal, MD;  Location: [facility] OR;  Service: General;  Laterality: N/A;  TAP BLOCK   INGUINAL HERNIA REPAIR Left 06/28/2023   Procedure: OPEN LEFT INGUINAL HERNIA REPAIR WITH MESH;  Surgeon: Oza Blumenthal, MD;  Location: [facility] OR;  Service: General;  Laterality: Left;   oral surgery     PELVIC LAPAROSCOPY  1998/1999 `   pelvic adhesions and pain   TOE SURGERY Left    great toe   WISDOM TOOTH EXTRACTION     Patient Active Problem List   Diagnosis Date Noted   Adhesive capsulitis of right shoulder 11/28/2023   B12 deficiency 11/28/2023   Neuropathy 06/22/2023   Inguinal hernia of left side without obstruction or gangrene 04/30/2020   Hyperlipidemia 07/30/2019   Syncope 07/29/2019   Essential hypertension 07/29/2019   Dysmenorrhea 05/08/2018    Herpes 03/21/2014   Suppurative hidradenitis    Depression     PCP: Lawrance Presume, MD   REFERRING PROVIDER: Arnie Lao*   REFERRING DIAG:  Diagnosis  M24.611 (ICD-10-CM) - Arthrofibrosis of right shoulder  Z98.890 (ICD-10-CM) - Status post arthroscopy of right shoulder    THERAPY DIAG:  Acute pain of right shoulder  Muscle weakness (generalized)  Localized edema  Rationale for Evaluation and Treatment: Rehabilitation  ONSET DATE: February 2024  SUBJECTIVE:  SUBJECTIVE STATEMENT: Pt indicated the shoulder/upper arm isn't has tight as it has been.  Pt indicated coming to therapy does seem to help more than just home stuff.   Pt indicated overall improvement to normal for Rt shoulder 70 %.    PERTINENT HISTORY: Pt s/p on 10/12/23 manipulation c debridement of anterior capsule,  inferior capsule and subacrominal space  PMH: anemia, anxiety, asthma, depression, diverticulosis, essential HTN, panic attacks, pre-diabetes, hernia repair, oral surgery, toe surgery, pelvic laparoscopy, cholecystectomy   PAIN:  NPRS scale: 4/10 Pain location: Rt shoulder Pain description: achy, sore, sharp at times, constant Aggravating factors: lifting my arm, ADL's Relieving factors: resting, pain meds as needed  PRECAUTIONS: None  WEIGHT BEARING RESTRICTIONS: No  FALLS:  Has patient fallen in last 6 months? Yes. Number of falls pt stating no injuries, pt stating she was dehydrated.   LIVING ENVIRONMENT: Lives with: lives with their family and lives alone Lives in: House/apartment Stairs: No Has following equipment at home: None  OCCUPATION: Out of work since May  PLOF: Independent  PATIENT GOALS: be able reach overhead   OBJECTIVE:   DIAGNOSTIC FINDINGS:  10/31/2023 review of  chart:  IMPRESSION: 1. Mild tendinosis of the supraspinatus tendon anteriorly. 2. Mild subacromial/subdeltoid bursitis. 3. Mild relative thickening of the inferior joint capsule as can be seen with adhesive capsulitis.  PATIENT SURVEYS:  01/18/2024:  FOTO update 62  12/18/2023: FOTO update 46%  11/21/23: FOTO update 48%  10/31/23: FOTO intake:  41%      COGNITION: 10/31/2023 Overall cognitive status: WFL     SENSATION: 10/31/2023 Tingling in Rt fingers, pt feels that may be due to B-12 deficiency  POSTURE 10/31/2023 Forward head and rounded shoulders  UPPER EXTREMITY ROM:   ROM (* indicated pain) Right 10/31/23 Left eval Right  PROM 11/07/23 Rt 11/14/23 PROM supine Rt 11/21/23 PROM supine Rt 11/29/2023 AROM Rt 12/05/23 Rt 12/12/23 Rt 12/26/23 supine Rt 01/02/24 supine Rt 01/09/24 supine Right 02/26/2024 Right 03/19/24 Right 04/17/2024 AROM in supine  Shoulder flexion 60 155 90* 110* 108 110 AA: 120 A: 124 A: 128 A: 125 A: 130 AROM in supine 130   AROM in standing against gravity: 125 deg AA: supine 140  143 AROM  Shoulder extension 15 40              Shoulder abduction 65 162 80* 100*    A: 100 A: 110 A: 112 A: 115 AROM in supine 125 AROM Supine 125 143 AROM   Shoulder adduction                Shoulder internal rotation 50 75 60* 60 *          70 AROM in supine in 45 deg abduction:  Shoulder external rotation 25 75 15-20* very guarded and pain limited  30 * 30   A: 50 A: 55 A: 54 A; 55 AROM in supine in 45 deg abduction: 65  70 AROM in supine in 45 deg abduction:  (Blank rows = not tested)  UPPER EXTREMITY MMT:  MMT Right 10/31/2023 Left 10/31/2023 Rt 12/18/23 Rt 01/09/24 Right 01/18/2024 Right 03/04/2024 Right 04/04/2024  Left 04/04/2024 Right 04/17/2024   Shoulder flexion 2 5 3  3+ 4/5 4+/5 c pain 5/5 5/5 5/5   Shoulder extension 3 5 3+ 3+        Shoulder abduction 2 5 3  3+ 3+/5 4/5 c pain 4+/5 5/5 4+/5   Shoulder adduction  Shoulder  internal rotation 2 5 3   5/5 5/5 5/5 5/5 5/5   Shoulder external rotation 2 5 3   4/5 4+/5 4/5 5/5 5/5   (Blank rows = not tested)   PALPATION:  04/11/2024:  Trigger points, tightness posterior capsule, infraspinatus.  10/31/2023 TTP: Rt upper trap, Rt bicep tendons. Rt deltoid, Rt supraspinatus.                                                                                                                                                                                                  TODAY'S TREATMENT:                                                                                            DATE: 04/17/2024:  Therex: UBE fwd/back 3 mins each way lvl 3.0 with 1 min rest break Supine Rt shoulder flexion AROM x 5 Cross arm stretch review in sitting 30 sec x 3 Sleeper stretch in partial Rt sidelying arm in 45 deg scaption 30 sec x 3 Hand behind back with strap Rt 30 sec x 3 for functional behind back reach improvements.    Neuro Re-ed (to improve muscle activation, postural awareness/endurance/control) Standing tband rows blue band c scapular retraction focus 2 x 15  Standing tband blue gh ext 2 x 15 Standing blue band ER walk outs 5 sec hold x 10 with towel under arm.   Manual: Percussive device to Rt posterior shoulder to help improve rotation mobility tolerance (ER/IR).     Vasopneumatic Device Rt shoulder low compression 34 degrees 10 mins in sitting  TODAY'S TREATMENT:                                                                                            DATE: 04/11/2024:  Therex: UBE fwd/back 3 mins each way lvl 3.0 with 1 min rest break  Cross arm stretch in supine Rt shoulder 15 sec x 5   TherActivity (to improve functional reaching/movement)  Standing Rt shoulder flexion eccentric only full range x 15 0 lb, x 15 1lb . Additional time for slow control movement focus.    Neuro Re-ed (to improve muscle activation, postural awareness/endurance/control) Stabilizations  holds against moderate external resistance 20-30 sec bouts in 90 deg flexion Rt shoulder  Standing shoulder 90 deg flexion  2 lb weight ball circles 20 x 2 cw, ccw each - performed bilaterally  Standing tband rows blue band c scapular retraction focus 2 x 15   Manual: Percussive device to Rt posterior shoulder to help improve rotation mobility tolerance (ER/IR).     Vasopneumatic Device Rt shoulder low compression 34 degrees 10 mins in sitting  TODAY'S TREATMENT:                                                                                            DATE: 04/04/2024:  Therex: Pulleys flexion, scaption 5 sec hold to tolerance bilateral UE 3 mins each way  Sidelying Rt shoulder abduction AROM x 10  Verbal review of existing HEP routine.   Neuro Re-ed (to improve muscle activation, postural awareness/endurance/control) Tband green GH ext 3 sec hold past leg x 15  Standing green band ER walk outs 5 sec reactive isometric hold with towel under arm x 15 bilateral  Standing green band rows c scapular retraction focus 2 x 15   Manual: Percussive device to Rt posterior shoulder to help improve rotation mobility tolerance (ER/IR).   After manual, improvement in symptoms with IR improved 20 deg post treatment.   Vasopneumatic Device Rt shoulder low compression 34 degrees 10 mins in sitting  TODAY'S TREATMENT:                                                                                            DATE: 03/19/2024:  TherEx: UBE: level 2, x 3 minutes each direction Rows: blue TB 2 x 10 holding 3 sec Standing green band ER 2 x 10 (towel under elbow) Side-lying abduction 1# x 15  Side-lying IR stretch on Left shoulder x 4 holding 10 sec  ThereActivities:  UE ranger flexion x 15 UE ranger scaption x 15 Standing flexion c 1 # bar x 15 focus on eccentric lowering     PATIENT EDUCATION: 01/18/2024 Education details: HEP update Person educated: Patient Education method: Programmer, multimedia,  Demonstration, Verbal cues, and Handouts Education comprehension: verbalized understanding, returned demonstration, and verbal cues required  HOME EXERCISE PROGRAM: Access Code: [email or web address] URL: [email or web address] Date: 01/18/2024 Prepared by: Bonna Bustard  Exercises - Supine Shoulder External Rotation in 45 Degrees Abduction AAROM with Dowel (Mirrored)  - 2-3 x daily - 7 x weekly - 2 sets -  10 reps - 2 seconds hold - Standing Shoulder Posterior Capsule Stretch (Mirrored)  - 2-3 x daily - 7 x weekly - 1 sets - 3 reps - 15-30 hold - Sleeper Stretch (Mirrored)  - 2-3 x daily - 7 x weekly - 1 sets - 3 reps - 30 hold - Standing Shoulder Row with Anchored Resistance  - 1 x daily - 7 x weekly - 1-2 sets - 10-15 reps - Shoulder Extension with Resistance  - 1 x daily - 7 x weekly - 1-2 sets - 10-15 reps - Shoulder External Rotation Reactive Isometrics (Mirrored)  - 1-2 x daily - 7 x weekly - 1 sets - 10 reps - 5-15 hold - Sidelying Shoulder Abduction Palm Forward  - 1-2 x daily - 7 x weekly - 2-3 sets - 10-15 reps - Sidelying Shoulder Flexion 15 Degrees (Mirrored)  - 1-2 x daily - 7 x weekly - 2-3 sets - 10-15 reps  ASSESSMENT:  CLINICAL IMPRESSION: The patient has attended 28 visits over the course of treatment cycle.  Patient has reported overall improvement at 70%.  See objective data above for updated information regarding current presentation.  At this time, Pt presented with continued Rt shoulder pain with mild end range mobility and strength deficits that may continued to benefit from skilled PT services at this time. Pt was in agreement with plan.  Plan for 1x/week for up to 8 more weeks but transition to HEP when appropriate.     OBJECTIVE IMPAIRMENTS: decreased ROM, decreased strength, increased edema, impaired UE functional use, postural dysfunction, and pain.   ACTIVITY LIMITATIONS: carrying, sleeping, bathing, toileting, dressing, reach over head, and  hygiene/grooming  PARTICIPATION LIMITATIONS: meal prep, cleaning, laundry, driving, shopping, community activity, and occupation  PERSONAL FACTORS: 3+ comorbidities: see pertinent history  are also affecting patient's functional outcome.   REHAB POTENTIAL: Good  CLINICAL DECISION MAKING: Stable/uncomplicated  EVALUATION COMPLEXITY: Low   GOALS: Goals reviewed with patient? Yes  SHORT TERM GOALS: (target date for Short term goals are 3 weeks 11/24/23)  1.Patient will demonstrate independent use of home exercise program to maintain progress from in clinic treatments. Goal status: MET 11/14/23  LONG TERM GOALS: (target dates for all long term goals are 6 additional weeks  06/12/2024 )   1. Patient will demonstrate/report pain at worst less than or equal to 2/10 to facilitate minimal limitation in daily activity secondary to pain symptoms. Goal status: revised goal date - 04/17/2024   2. Patient will demonstrate independent use of home exercise program to facilitate ability to maintain/progress functional gains from skilled physical therapy services. Goal status  revised goal date - 04/17/2024   3. Patient will demonstrate FOTO outcome > or = 61 % to indicate reduced disability due to condition. Goal status:met 01/18/2024   4.  Patient will demonstrate Rt UE MMT >/= 4/5 throughout to facilitate lifting, reaching, carrying at [facility] in daily activity.   Goal status: revised goal date - 04/17/2024   5.  Patient will demonstrate Rt GH joint AROM WFL s symptoms to facilitate usual overhead reaching, self care, dressing at PLOF.    Goal status: revised goal date - 04/17/2024   6.  Pt will improve her Rt shoulder flexion to >/= 140 degrees for improvements in ADL's.  Goal status: revised goal date - 04/17/2024  7. Pt will improve her Rt shoulder ER to >/= 60 degrees in order to improved functional actives.   Goal Status: Met 04/17/2024  PLAN:  PT FREQUENCY: 1x/week  PT DURATION: 8  weeks  PLANNED INTERVENTIONS: Can include 40981- PT Re-evaluation, 97110-Therapeutic exercises, 97530- Therapeutic activity, [id number]- Neuromuscular re-education, 97535- Self Care, 97140- Manual therapy, [phone]- Gait training, [phone]- Orthotic Fit/training, [phone]- Canalith repositioning, [id number]- Aquatic Therapy, 97014- Electrical stimulation (unattended), [id number]- Electrical stimulation (manual), [id number]- Vasopneumatic device, [id number]- Ultrasound, [id number]- Traction (mechanical), [id number]- Ionotophoresis 4mg /ml Dexamethasone , Patient/Family education, Balance training, Stair training, Taping, Dry Needling, Joint mobilization, Joint manipulation, Spinal manipulation, Spinal mobilization, Scar mobilization, Vestibular training, Visual/preceptual remediation/compensation, DME instructions, Cryotherapy, and Moist heat.  All performed as medically necessary.  All included unless contraindicated  PLAN FOR NEXT SESSION: Percussive device , strengthening.    Bonna Bustard, PT, DPT, OCS, ATC 04/17/24  9:32 AM

## 2024-04-17 NOTE — Telephone Encounter (Signed)
 Called pt and left VM to call the office.   I need to get clarification on leave, limitations, and projected return to work.

## 2024-04-17 NOTE — Telephone Encounter (Signed)
 Called [employer], requested extension for form completion/submission to 04/19/24.   [employer] has noted this on pt account.

## 2024-04-18 ENCOUNTER — Encounter: Payer: Self-pay | Admitting: Obstetrics and Gynecology

## 2024-04-18 ENCOUNTER — Ambulatory Visit: Admitting: Obstetrics and Gynecology

## 2024-04-18 VITALS — BP 138/82 | HR 74 | Wt 147.0 lb

## 2024-04-18 DIAGNOSIS — R87610 Atypical squamous cells of undetermined significance on cytologic smear of cervix (ASC-US): Secondary | ICD-10-CM | POA: Insufficient documentation

## 2024-04-18 DIAGNOSIS — N958 Other specified menopausal and perimenopausal disorders: Secondary | ICD-10-CM | POA: Insufficient documentation

## 2024-04-18 DIAGNOSIS — B9689 Other specified bacterial agents as the cause of diseases classified elsewhere: Secondary | ICD-10-CM

## 2024-04-18 DIAGNOSIS — N760 Acute vaginitis: Secondary | ICD-10-CM | POA: Diagnosis not present

## 2024-04-18 DIAGNOSIS — N898 Other specified noninflammatory disorders of vagina: Secondary | ICD-10-CM

## 2024-04-18 LAB — WET PREP FOR TRICH, YEAST, CLUE

## 2024-04-18 MED ORDER — METRONIDAZOLE 0.75 % VA GEL: 1.0000 | VAGINAL | 7 refills | Status: AC

## 2024-04-18 MED ORDER — METRONIDAZOLE 0.75 % VA GEL: 1.0000 | Freq: Every day | VAGINAL | 0 refills | Status: AC

## 2024-04-18 MED ORDER — ESTRADIOL 0.1 MG/GM VA CREA: TOPICAL_CREAM | VAGINAL | 1 refills | Status: AC

## 2024-04-18 NOTE — Patient Instructions (Signed)
 Take 600mg  ibuprofen  prior to your next appointment.

## 2024-04-18 NOTE — Assessment & Plan Note (Signed)
 Abnormal PAP results reviewed. ASCCP guidelines reviewed. Recommend colpo, RTO for appt

## 2024-04-18 NOTE — Progress Notes (Signed)
 53 y.o. G9P0110 female with HS here for referral for abnormal PAP. Single, relationship x64yr.  No LMP recorded. (Menstrual status: Perimenopausal).      Component Value Date/Time   DIAGPAP (A) 01/05/2024 1111    - Atypical squamous cells of undetermined significance (ASC-US )   DIAGPAP - Low grade squamous intraepithelial lesion (LSIL) (A) 10/24/2022 1621   DIAGPAP  11/07/2017 0000    NEGATIVE FOR INTRAEPITHELIAL LESIONS OR MALIGNANCY.   HPVHIGH Positive (A) 01/05/2024 1111   HPVHIGH Positive (A) 10/24/2022 1621   ADEQPAP  01/05/2024 1111    Satisfactory for evaluation; transformation zone component PRESENT.   ADEQPAP  10/24/2022 1621    Satisfactory for evaluation; transformation zone component PRESENT.   ADEQPAP  11/07/2017 0000    Satisfactory for evaluation  endocervical/transformation zone component PRESENT.   History of cervical cryotherapy in her 36s. She also notes recurrent BV and yeast infection, vaginal itching today. dryness She is currently not having monthly menstrual. She has intermitted spotting off and on.  Last spotting was in Nov 2024. No bleeding since. Has not had 63month without cycle. Notes feeling hot at night only, no sweats. Would like to establish care, future.   Birth control: None Sexually active: yes   GYN HISTORY: Cyrotherapy in 61s Hx of PID, recurrent trich  OB History  Gravida Para Term Preterm AB Living  2 1  1 1  0  SAB IAB Ectopic Multiple Live Births  1        # Outcome Date GA Lbr Len/2nd Weight Sex Type Anes PTL Lv  2 Preterm           1 SAB             Past Medical History:  Diagnosis Date   Anemia    Anxiety    Arthritis 02/23/2014   Rib Cage   Asthma    Depression    Diverticulosis    Dysmenorrhea 05/08/2018   Essential hypertension 07/29/2019   HLD (hyperlipidemia)    Panic attacks    Pre-diabetes    Suppurative hidradenitis     Past Surgical History:  Procedure Laterality Date   CHOLECYSTECTOMY N/A 06/28/2023    Procedure: LAPAROSCOPIC CHOLECYSTECTOMY;  Surgeon: Oza Blumenthal, MD;  Location: [facility] OR;  Service: General;  Laterality: N/A;  TAP BLOCK   INGUINAL HERNIA REPAIR Left 06/28/2023   Procedure: OPEN LEFT INGUINAL HERNIA REPAIR WITH MESH;  Surgeon: Oza Blumenthal, MD;  Location: [facility] OR;  Service: General;  Laterality: Left;   oral surgery     PELVIC LAPAROSCOPY  1998/1999 `   pelvic adhesions and pain   TOE SURGERY Left    great toe   WISDOM TOOTH EXTRACTION      Current Outpatient Medications on File Prior to Visit  Medication Sig Dispense Refill   albuterol  (VENTOLIN  HFA) 108 (90 Base) MCG/ACT inhaler Inhale 2 puffs into the lungs every 6 (six) hours as needed for wheezing or shortness of breath. 8 g 4   amLODipine  (NORVASC ) 10 MG tablet Take 1 tablet (10 mg total) by mouth daily. 90 tablet 1   atorvastatin  (LIPITOR) 10 MG tablet TAKE 1 TABLET BY MOUTH EVERY DAY 90 tablet 1   desonide (DESOWEN) 0.05 % ointment Apply 1 Application topically daily as needed (rash).     losartan  (COZAAR ) 25 MG tablet Take 1 tablet (25 mg total) by mouth daily. 90 tablet 1   fluticasone  (FLONASE ) 50 MCG/ACT nasal spray Place 1 spray into both nostrils daily. (Patient  not taking: Reported on 04/18/2024) 16 g 1   Current Facility-Administered Medications on File Prior to Visit  Medication Dose Route Frequency Provider Last Rate Last Admin   cyanocobalamin  (VITAMIN B12) injection 1,000 mcg  1,000 mcg Intramuscular Once         Allergies  Allergen Reactions   Codeine  Shortness Of Breath      PE Today's Vitals   04/18/24 0912  BP: 138/82  Pulse: 74  TempSrc: Oral  SpO2: 96%  Weight: 147 lb (66.7 kg)   Body mass index is 27.78 kg/m.  Physical Exam Vitals reviewed. Exam conducted with a chaperone present.  Constitutional:      General: She is not in acute distress.    Appearance: Normal appearance.  HENT:     Head: Normocephalic and atraumatic.     Nose: Nose normal.  Eyes:     Extraocular  Movements: Extraocular movements intact.     Conjunctiva/sclera: Conjunctivae normal.  Pulmonary:     Effort: Pulmonary effort is normal.  Genitourinary:    General: Normal vulva.     Exam position: Lithotomy position.     Vagina: Vaginal discharge present.     Cervix: Normal. No cervical motion tenderness, discharge or lesion.     Uterus: Normal. Not enlarged and not tender.      Adnexa: Right adnexa normal and left adnexa normal.     Comments: +vaginal atrophy, flush cervix Musculoskeletal:        General: Normal range of motion.     Cervical back: Normal range of motion.  Neurological:     General: No focal deficit present.     Mental Status: She is alert.  Psychiatric:        Mood and Affect: Mood normal.        Behavior: Behavior normal.       Assessment and Plan:        Vaginal itching -     WET PREP FOR TRICH, YEAST, CLUE  ASCUS with positive high risk HPV cervical Assessment & Plan: Abnormal PAP results reviewed. ASCCP guidelines reviewed. Recommend colpo, RTO for appt   Orders: -     Colposcopy; Future  BV (bacterial vaginosis) -     metroNIDAZOLE ; Place 1 Applicatorful vaginally at bedtime for 5 days.  Dispense: 50 g; Refill: 0 -     metroNIDAZOLE ; Place 1 Applicatorful vaginally 2 (two) times a week for 52 doses. 6 months of treatment.  Dispense: 70 g; Refill: 7  Genitourinary syndrome of menopause Assessment & Plan: Reviewed safety profile of low dose vaginal estrogen, however reviewed that higher doses have been associated with DVT, breast and uterine cancer.  Considering applying aquaphor or coconut oil to the vulva after showers. You could also using daily vaginal moisturizers by brands like Good Clean Love and Ah! Yes.   Orders: -     Estradiol ; Apply 1/2 gram to vulva nightly for 2 weeks then decrease to 1/2 gram to vulva two nights a week. Do not use applicator.  Dispense: 42.5 g; Refill: 1    Offered chronic BV treatment, patient  amenable Romaine Closs, MD

## 2024-04-18 NOTE — Assessment & Plan Note (Signed)
 Reviewed safety profile of low dose vaginal estrogen, however reviewed that higher doses have been associated with DVT, breast and uterine cancer.  Considering applying aquaphor or coconut oil to the vulva after showers. You could also using daily vaginal moisturizers by brands like Good Clean Love and Ah! Yes.

## 2024-04-19 NOTE — Telephone Encounter (Signed)
Called pt, left VM to call the office.  

## 2024-04-22 NOTE — Telephone Encounter (Signed)
 Request faxed to [employer] this past Friday advising that I've been unable to reach pt by phone and request for extension of form due date.

## 2024-04-25 ENCOUNTER — Other Ambulatory Visit: Payer: Self-pay | Admitting: Nurse Practitioner

## 2024-04-25 ENCOUNTER — Other Ambulatory Visit: Payer: Self-pay

## 2024-04-25 DIAGNOSIS — B9689 Other specified bacterial agents as the cause of diseases classified elsewhere: Secondary | ICD-10-CM

## 2024-04-25 MED ORDER — FLUTICASONE PROPIONATE 50 MCG/ACT NA SUSP
1.0000 | Freq: Every day | NASAL | 1 refills | Status: DC
  Filled 2024-04-25: qty 16, 30d supply, fill #0

## 2024-04-25 NOTE — Telephone Encounter (Signed)
 Form completed, reviewed and signed by Dr. [name], and placed at the front desk for faxing/scanning.

## 2024-04-25 NOTE — Telephone Encounter (Signed)
 Pt stopped by to review and complete forms. Notes continues improvement but still having pain in the upper arm when clicking the mouse and hitting the space bar. The arm feels fatigued, ROM is still limited but improving. PT has been extending through 06/12/24. Re-eval scheduled with Dr. Amiel Balder 06/19/24. Disability extended through 06/24/24.

## 2024-05-01 ENCOUNTER — Ambulatory Visit (INDEPENDENT_AMBULATORY_CARE_PROVIDER_SITE_OTHER): Admitting: Rehabilitative and Restorative Service Providers"

## 2024-05-01 ENCOUNTER — Encounter: Payer: Self-pay | Admitting: Rehabilitative and Restorative Service Providers"

## 2024-05-01 DIAGNOSIS — R600 Localized edema: Secondary | ICD-10-CM | POA: Diagnosis not present

## 2024-05-01 DIAGNOSIS — M25511 Pain in right shoulder: Secondary | ICD-10-CM | POA: Diagnosis not present

## 2024-05-01 DIAGNOSIS — M6281 Muscle weakness (generalized): Secondary | ICD-10-CM

## 2024-05-01 NOTE — Therapy (Signed)
 OUTPATIENT PHYSICAL THERAPY TREATMENT  Patient Name: Misty Bailey MRN: [id number] DOB:Jan 30, 1971, 53 y.o., female Today's Date: 05/01/2024   END OF SESSION:  PT End of Session - 05/01/24 0852     Visit Number 29    Number of Visits 35    Date for PT Re-Evaluation 06/12/24    Authorization Type [employer] $40 copay    Progress Note Due on Visit 28    PT Start Time 0849    PT Stop Time 0938    PT Time Calculation (min) 49 min    Activity Tolerance Patient limited by fatigue    Behavior During Therapy [facility] for tasks assessed/performed                Past Medical History:  Diagnosis Date   Anemia    Anxiety    Arthritis 02/23/2014   Rib Cage   Asthma    Depression    Diverticulosis    Dysmenorrhea 05/08/2018   Essential hypertension 07/29/2019   HLD (hyperlipidemia)    Panic attacks    Pre-diabetes    Suppurative hidradenitis    Past Surgical History:  Procedure Laterality Date   CHOLECYSTECTOMY N/A 06/28/2023   Procedure: LAPAROSCOPIC CHOLECYSTECTOMY;  Surgeon: Oza Blumenthal, MD;  Location: [facility] OR;  Service: General;  Laterality: N/A;  TAP BLOCK   INGUINAL HERNIA REPAIR Left 06/28/2023   Procedure: OPEN LEFT INGUINAL HERNIA REPAIR WITH MESH;  Surgeon: Oza Blumenthal, MD;  Location: [facility] OR;  Service: General;  Laterality: Left;   oral surgery     PELVIC LAPAROSCOPY  1998/1999 `   pelvic adhesions and pain   TOE SURGERY Left    great toe   WISDOM TOOTH EXTRACTION     Patient Active Problem List   Diagnosis Date Noted   ASCUS with positive high risk HPV cervical 04/18/2024   Genitourinary syndrome of menopause 04/18/2024   Adhesive capsulitis of right shoulder 11/28/2023   B12 deficiency 11/28/2023   Neuropathy 06/22/2023   Inguinal hernia of left side without obstruction or gangrene 04/30/2020   Hyperlipidemia 07/30/2019   Syncope 07/29/2019   Essential hypertension 07/29/2019   Dysmenorrhea 05/08/2018   Herpes 03/21/2014   Suppurative hidradenitis     Depression     PCP: Lawrance Presume, MD   REFERRING PROVIDER: Arnie Lao*   REFERRING DIAG:  Diagnosis  M24.611 (ICD-10-CM) - Arthrofibrosis of right shoulder  Z98.890 (ICD-10-CM) - Status post arthroscopy of right shoulder    THERAPY DIAG:  Acute pain of right shoulder  Muscle weakness (generalized)  Localized edema  Rationale for Evaluation and Treatment: Rehabilitation  ONSET DATE: February 2024  SUBJECTIVE:  SUBJECTIVE STATEMENT: Pt indicated not as tight as it has been.  Reported working on things at home.  Reported 3.9/10 pain today  PERTINENT HISTORY: Pt s/p on 10/12/23 manipulation c debridement of anterior capsule,  inferior capsule and subacrominal space  PMH: anemia, anxiety, asthma, depression, diverticulosis, essential HTN, panic attacks, pre-diabetes, hernia repair, oral surgery, toe surgery, pelvic laparoscopy, cholecystectomy   PAIN:  NPRS scale: 3.9/10 Pain location: Rt shoulder Pain description: achy, sore, sharp at times, constant Aggravating factors: lifting my arm, ADL's Relieving factors: resting, pain meds as needed  PRECAUTIONS: None  WEIGHT BEARING RESTRICTIONS: No  FALLS:  Has patient fallen in last 6 months? Yes. Number of falls pt stating no injuries, pt stating she was dehydrated.   LIVING ENVIRONMENT: Lives with: lives with their family and lives alone Lives in: House/apartment Stairs: No Has following equipment at home: None  OCCUPATION: Out of work since May  PLOF: Independent  PATIENT GOALS: be able reach overhead   OBJECTIVE:   DIAGNOSTIC FINDINGS:  10/31/2023 review of chart:  IMPRESSION: 1. Mild tendinosis of the supraspinatus tendon anteriorly. 2. Mild subacromial/subdeltoid bursitis. 3. Mild relative thickening of  the inferior joint capsule as can be seen with adhesive capsulitis.  PATIENT SURVEYS:  01/18/2024:  FOTO update 62  12/18/2023: FOTO update 46%  11/21/23: FOTO update 48%  10/31/23: FOTO intake:  41%      COGNITION: 10/31/2023 Overall cognitive status: WFL     SENSATION: 10/31/2023 Tingling in Rt fingers, pt feels that may be due to B-12 deficiency  POSTURE 10/31/2023 Forward head and rounded shoulders  UPPER EXTREMITY ROM:   ROM (* indicated pain) Right 10/31/23 Left eval Right  PROM 11/07/23 Rt 11/14/23 PROM supine Rt 11/21/23 PROM supine Rt 11/29/2023 AROM Rt 12/05/23 Rt 12/12/23 Rt 12/26/23 supine Rt 01/02/24 supine Rt 01/09/24 supine Right 02/26/2024 Right 03/19/24 Right 04/17/2024 AROM in supine  Shoulder flexion 60 155 90* 110* 108 110 AA: 120 A: 124 A: 128 A: 125 A: 130 AROM in supine 130   AROM in standing against gravity: 125 deg AA: supine 140  143 AROM  Shoulder extension 15 40              Shoulder abduction 65 162 80* 100*    A: 100 A: 110 A: 112 A: 115 AROM in supine 125 AROM Supine 125 143 AROM   Shoulder adduction                Shoulder internal rotation 50 75 60* 60 *          70 AROM in supine in 45 deg abduction:  Shoulder external rotation 25 75 15-20* very guarded and pain limited  30 * 30   A: 50 A: 55 A: 54 A; 55 AROM in supine in 45 deg abduction: 65  70 AROM in supine in 45 deg abduction:  (Blank rows = not tested)  UPPER EXTREMITY MMT:  MMT Right 10/31/2023 Left 10/31/2023 Rt 12/18/23 Rt 01/09/24 Right 01/18/2024 Right 03/04/2024 Right 04/04/2024  Left 04/04/2024 Right 04/17/2024   Shoulder flexion 2 5 3  3+ 4/5 4+/5 c pain 5/5 5/5 5/5   Shoulder extension 3 5 3+ 3+        Shoulder abduction 2 5 3  3+ 3+/5 4/5 c pain 4+/5 5/5 4+/5   Shoulder adduction            Shoulder internal rotation 2 5 3   5/5 5/5 5/5 5/5 5/5   Shoulder external rotation 2  5 3  4/5 4+/5 4/5 5/5 5/5   (Blank rows = not tested)   PALPATION:  05/01/2024:   Continued trigger points noted in Rt upper trap, infraspinatus but reducing in number.   04/11/2024:  Trigger points, tightness posterior capsule, infraspinatus.  10/31/2023 TTP: Rt upper trap, Rt bicep tendons. Rt deltoid, Rt supraspinatus.                                                                                                                                                                                                  TODAY'S TREATMENT:                                                                                            DATE: 04/17/2024:  Therex: UBE fwd/back 3 mins each way lvl 3.0 with 1 min rest break- performed with 10 second faster interval top of each minute.  Sidelying Rt shoulder ER with towel under arm 3 x10  1 lb  Sidelying Rt shoulder abduction 2 x 15 Sidelying Rt shoulder flexion 3 x 10  Supine cross arm stretch Rt 15 seconds x 3  Supine protraction 3 sec hold x 15 3 lbs Rt shoulder  Standing shoulder retraction 5 sec hold x 10    Manual: Percussive device to Rt posterior shoulder to help improve rotation mobility tolerance (ER/IR).   Rt upper trap as well.   Vasopneumatic Device Rt shoulder low compression 34 degrees 10 mins in sitting  TODAY'S TREATMENT:                                                                                            DATE: 04/11/2024:  Therex: UBE fwd/back 3 mins each way lvl 3.0 with 1 min rest break Cross arm stretch in supine Rt shoulder 15 sec x 5   TherActivity (to improve functional reaching/movement)  Standing  Rt shoulder flexion eccentric only full range x 15 0 lb, x 15 1lb . Additional time for slow control movement focus.    Neuro Re-ed (to improve muscle activation, postural awareness/endurance/control) Stabilizations holds against moderate external resistance 20-30 sec bouts in 90 deg flexion Rt shoulder  Standing shoulder 90 deg flexion  2 lb weight ball circles 20 x 2 cw, ccw each - performed bilaterally   Standing tband rows blue band c scapular retraction focus 2 x 15   Manual: Percussive device to Rt posterior shoulder to help improve rotation mobility tolerance (ER/IR).     Vasopneumatic Device Rt shoulder low compression 34 degrees 10 mins in sitting  TODAY'S TREATMENT:                                                                                            DATE: 04/04/2024:  Therex: Pulleys flexion, scaption 5 sec hold to tolerance bilateral UE 3 mins each way  Sidelying Rt shoulder abduction AROM x 10  Verbal review of existing HEP routine.   Neuro Re-ed (to improve muscle activation, postural awareness/endurance/control) Tband green GH ext 3 sec hold past leg x 15  Standing green band ER walk outs 5 sec reactive isometric hold with towel under arm x 15 bilateral  Standing green band rows c scapular retraction focus 2 x 15   Manual: Percussive device to Rt posterior shoulder to help improve rotation mobility tolerance (ER/IR).   After manual, improvement in symptoms with IR improved 20 deg post treatment.   Vasopneumatic Device Rt shoulder low compression 34 degrees 10 mins in sitting  TODAY'S TREATMENT:                                                                                            DATE: 03/19/2024:  TherEx: UBE: level 2, x 3 minutes each direction Rows: blue TB 2 x 10 holding 3 sec Standing green band ER 2 x 10 (towel under elbow) Side-lying abduction 1# x 15  Side-lying IR stretch on Left shoulder x 4 holding 10 sec  ThereActivities:  UE ranger flexion x 15 UE ranger scaption x 15 Standing flexion c 1 # bar x 15 focus on eccentric lowering     PATIENT EDUCATION: 01/18/2024 Education details: HEP update Person educated: Patient Education method: Programmer, multimedia, Demonstration, Verbal cues, and Handouts Education comprehension: verbalized understanding, returned demonstration, and verbal cues required  HOME EXERCISE PROGRAM: Access Code: [email or web address] URL:  [email or web address] Date: 01/18/2024 Prepared by: Bonna Bustard  Exercises - Supine Shoulder External Rotation in 45 Degrees Abduction AAROM with Dowel (Mirrored)  - 2-3 x daily - 7 x weekly - 2 sets - 10 reps - 2 seconds hold - Standing Shoulder Posterior Capsule Stretch (Mirrored)  - 2-3 x daily -  7 x weekly - 1 sets - 3 reps - 15-30 hold - Sleeper Stretch (Mirrored)  - 2-3 x daily - 7 x weekly - 1 sets - 3 reps - 30 hold - Standing Shoulder Row with Anchored Resistance  - 1 x daily - 7 x weekly - 1-2 sets - 10-15 reps - Shoulder Extension with Resistance  - 1 x daily - 7 x weekly - 1-2 sets - 10-15 reps - Shoulder External Rotation Reactive Isometrics (Mirrored)  - 1-2 x daily - 7 x weekly - 1 sets - 10 reps - 5-15 hold - Sidelying Shoulder Abduction Palm Forward  - 1-2 x daily - 7 x weekly - 2-3 sets - 10-15 reps - Sidelying Shoulder Flexion 15 Degrees (Mirrored)  - 1-2 x daily - 7 x weekly - 2-3 sets - 10-15 reps  ASSESSMENT:  CLINICAL IMPRESSION: Fatigue still present in gravity reduced strengthening but making slow but steady progress in some weight increase in movements.  Quality of movement also continued to show mild improvements with less compensatory movement noted.     OBJECTIVE IMPAIRMENTS: decreased ROM, decreased strength, increased edema, impaired UE functional use, postural dysfunction, and pain.   ACTIVITY LIMITATIONS: carrying, sleeping, bathing, toileting, dressing, reach over head, and hygiene/grooming  PARTICIPATION LIMITATIONS: meal prep, cleaning, laundry, driving, shopping, community activity, and occupation  PERSONAL FACTORS: 3+ comorbidities: see pertinent history  are also affecting patient's functional outcome.   REHAB POTENTIAL: Good  CLINICAL DECISION MAKING: Stable/uncomplicated  EVALUATION COMPLEXITY: Low   GOALS: Goals reviewed with patient? Yes  SHORT TERM GOALS: (target date for Short term goals are 3 weeks  11/24/23)  1.Patient will demonstrate independent use of home exercise program to maintain progress from in clinic treatments. Goal status: MET 11/14/23  LONG TERM GOALS: (target dates for all long term goals are 6 additional weeks  06/12/2024 )   1. Patient will demonstrate/report pain at worst less than or equal to 2/10 to facilitate minimal limitation in daily activity secondary to pain symptoms. Goal status: revised goal date - 04/17/2024   2. Patient will demonstrate independent use of home exercise program to facilitate ability to maintain/progress functional gains from skilled physical therapy services. Goal status  revised goal date - 04/17/2024   3. Patient will demonstrate FOTO outcome > or = 61 % to indicate reduced disability due to condition. Goal status:met 01/18/2024   4.  Patient will demonstrate Rt UE MMT >/= 4/5 throughout to facilitate lifting, reaching, carrying at [facility] in daily activity.   Goal status: revised goal date - 04/17/2024   5.  Patient will demonstrate Rt GH joint AROM WFL s symptoms to facilitate usual overhead reaching, self care, dressing at PLOF.    Goal status: revised goal date - 04/17/2024   6.  Pt will improve her Rt shoulder flexion to >/= 140 degrees for improvements in ADL's.  Goal status: revised goal date - 04/17/2024  7. Pt will improve her Rt shoulder ER to >/= 60 degrees in order to improved functional actives.   Goal Status: Met 04/17/2024     PLAN:  PT FREQUENCY: 1x/week  PT DURATION: 8 weeks  PLANNED INTERVENTIONS: Can include 16109- PT Re-evaluation, 97110-Therapeutic exercises, 97530- Therapeutic activity, 97112- Neuromuscular re-education, 97535- Self Care, 97140- Manual therapy, [id number]- Gait training, [phone]- Orthotic Fit/training, [phone]- Canalith repositioning, [id number]- Aquatic Therapy, 97014- Electrical stimulation (unattended), [id number]- Electrical stimulation (manual), [id number]- Vasopneumatic device, [id number]- Ultrasound, [id number]- Traction  (mechanical), [id number]- Ionotophoresis 4mg /ml Dexamethasone , Patient/Family education, Balance training,  Stair training, Taping, Dry Needling, Joint mobilization, Joint manipulation, Spinal manipulation, Spinal mobilization, Scar mobilization, Vestibular training, Visual/preceptual remediation/compensation, DME instructions, Cryotherapy, and Moist heat.  All performed as medically necessary.  All included unless contraindicated  PLAN FOR NEXT SESSION: Continue to build endurance with higher rep strengthening to help avoid pain increase with too much resistance.    Bonna Bustard, PT, DPT, OCS, ATC 05/01/24  9:27 AM

## 2024-05-06 ENCOUNTER — Other Ambulatory Visit: Payer: Self-pay

## 2024-05-08 ENCOUNTER — Encounter: Payer: Self-pay | Admitting: Rehabilitative and Restorative Service Providers"

## 2024-05-08 ENCOUNTER — Ambulatory Visit: Admitting: Rehabilitative and Restorative Service Providers"

## 2024-05-08 DIAGNOSIS — M25511 Pain in right shoulder: Secondary | ICD-10-CM

## 2024-05-08 DIAGNOSIS — R600 Localized edema: Secondary | ICD-10-CM | POA: Diagnosis not present

## 2024-05-08 DIAGNOSIS — M6281 Muscle weakness (generalized): Secondary | ICD-10-CM | POA: Diagnosis not present

## 2024-05-08 NOTE — Therapy (Signed)
 OUTPATIENT PHYSICAL THERAPY TREATMENT  Patient Name: Misty Bailey MRN: [id number] DOB:06-20-1971, 53 y.o., female Today's Date: 05/08/2024   END OF SESSION:  PT End of Session - 05/08/24 0857     Visit Number 30    Number of Visits 35    Date for PT Re-Evaluation 06/12/24    Authorization Type [employer] $40 copay    Progress Note Due on Visit 35    PT Start Time 0850    PT Stop Time 0953    PT Time Calculation (min) 63 min    Activity Tolerance Patient limited by fatigue    Behavior During Therapy [facility] for tasks assessed/performed                 Past Medical History:  Diagnosis Date   Anemia    Anxiety    Arthritis 02/23/2014   Rib Cage   Asthma    Depression    Diverticulosis    Dysmenorrhea 05/08/2018   Essential hypertension 07/29/2019   HLD (hyperlipidemia)    Panic attacks    Pre-diabetes    Suppurative hidradenitis    Past Surgical History:  Procedure Laterality Date   CHOLECYSTECTOMY N/A 06/28/2023   Procedure: LAPAROSCOPIC CHOLECYSTECTOMY;  Surgeon: Oza Blumenthal, MD;  Location: [facility] OR;  Service: General;  Laterality: N/A;  TAP BLOCK   INGUINAL HERNIA REPAIR Left 06/28/2023   Procedure: OPEN LEFT INGUINAL HERNIA REPAIR WITH MESH;  Surgeon: Oza Blumenthal, MD;  Location: [facility] OR;  Service: General;  Laterality: Left;   oral surgery     PELVIC LAPAROSCOPY  1998/1999 `   pelvic adhesions and pain   TOE SURGERY Left    great toe   WISDOM TOOTH EXTRACTION     Patient Active Problem List   Diagnosis Date Noted   ASCUS with positive high risk HPV cervical 04/18/2024   Genitourinary syndrome of menopause 04/18/2024   Adhesive capsulitis of right shoulder 11/28/2023   B12 deficiency 11/28/2023   Neuropathy 06/22/2023   Inguinal hernia of left side without obstruction or gangrene 04/30/2020   Hyperlipidemia 07/30/2019   Syncope 07/29/2019   Essential hypertension 07/29/2019   Dysmenorrhea 05/08/2018   Herpes 03/21/2014   Suppurative  hidradenitis    Depression     PCP: Lawrance Presume, MD   REFERRING PROVIDER: Arnie Lao*   REFERRING DIAG:  Diagnosis  M24.611 (ICD-10-CM) - Arthrofibrosis of right shoulder  Z98.890 (ICD-10-CM) - Status post arthroscopy of right shoulder    THERAPY DIAG:  Acute pain of right shoulder  Muscle weakness (generalized)  Localized edema  Rationale for Evaluation and Treatment: Rehabilitation  ONSET DATE: February 2024  SUBJECTIVE:  SUBJECTIVE STATEMENT: Pt indicated feeling tightness from not doing as much movement in HEP last day or so.  Reported overall getting a little better in daily movements.   PERTINENT HISTORY: Pt s/p on 10/12/23 manipulation c debridement of anterior capsule,  inferior capsule and subacrominal space  PMH: anemia, anxiety, asthma, depression, diverticulosis, essential HTN, panic attacks, pre-diabetes, hernia repair, oral surgery, toe surgery, pelvic laparoscopy, cholecystectomy   PAIN:  NPRS scale: 3.8/10 Pain location: Rt shoulder Pain description: achy, sore, sharp at times, constant Aggravating factors: lifting my arm, ADL's Relieving factors: resting, pain meds as needed  PRECAUTIONS: None  WEIGHT BEARING RESTRICTIONS: No  FALLS:  Has patient fallen in last 6 months? Yes. Number of falls pt stating no injuries, pt stating she was dehydrated.   LIVING ENVIRONMENT: Lives with: lives with their family and lives alone Lives in: House/apartment Stairs: No Has following equipment at home: None  OCCUPATION: Out of work since May  PLOF: Independent  PATIENT GOALS: be able reach overhead   OBJECTIVE:   DIAGNOSTIC FINDINGS:  10/31/2023 review of chart:  IMPRESSION: 1. Mild tendinosis of the supraspinatus tendon anteriorly. 2. Mild  subacromial/subdeltoid bursitis. 3. Mild relative thickening of the inferior joint capsule as can be seen with adhesive capsulitis.  PATIENT SURVEYS:  FOTO discontinued by clinic.  01/18/2024:  FOTO update 62  12/18/2023: FOTO update 46%  11/21/23: FOTO update 48%  10/31/23: FOTO intake:  41%      COGNITION: 10/31/2023 Overall cognitive status: WFL     SENSATION: 10/31/2023 Tingling in Rt fingers, pt feels that may be due to B-12 deficiency  POSTURE 10/31/2023 Forward head and rounded shoulders  UPPER EXTREMITY ROM:   ROM (* indicated pain) Right 10/31/23 Left eval Right  PROM 11/07/23 Rt 11/14/23 PROM supine Rt 11/21/23 PROM supine Rt 11/29/2023 AROM Rt 12/05/23 Rt 12/12/23 Rt 12/26/23 supine Rt 01/02/24 supine Rt 01/09/24 supine Right 02/26/2024 Right 03/19/24 Right 04/17/2024 AROM in supine Right 05/08/2024 AROM in supine  Shoulder flexion 60 155 90* 110* 108 110 AA: 120 A: 124 A: 128 A: 125 A: 130 AROM in supine 130   AROM in standing against gravity: 125 deg AA: supine 140  143 AROM 150 AROM  Shoulder extension 15 40               Shoulder abduction 65 162 80* 100*    A: 100 A: 110 A: 112 A: 115 AROM in supine 125 AROM Supine 125 143 AROM    Shoulder adduction                 Shoulder internal rotation 50 75 60* 60 *          70 AROM in supine in 45 deg abduction: 80 AROM in supine in 45 deg abduction:  Shoulder external rotation 25 75 15-20* very guarded and pain limited  30 * 30   A: 50 A: 55 A: 54 A; 55 AROM in supine in 45 deg abduction: 65  70 AROM in supine in 45 deg abduction: 80 AROM in supine in 45 deg abduction:  (Blank rows = not tested)  UPPER EXTREMITY MMT:  MMT Right 10/31/2023 Left 10/31/2023 Rt 12/18/23 Rt 01/09/24 Right 01/18/2024 Right 03/04/2024 Right 04/04/2024  Left 04/04/2024 Right 04/17/2024   Shoulder flexion 2 5 3  3+ 4/5 4+/5 c pain 5/5 5/5 5/5   Shoulder extension 3 5 3+ 3+        Shoulder abduction 2 5 3  3+ 3+/5 4/5 c  pain 4+/5 5/5 4+/5   Shoulder adduction            Shoulder internal rotation 2 5 3   5/5 5/5 5/5 5/5 5/5   Shoulder external rotation 2 5 3   4/5 4+/5 4/5 5/5 5/5   (Blank rows = not tested)   PALPATION:  05/01/2024:  Continued trigger points noted in Rt upper trap, infraspinatus but reducing in number.   04/11/2024:  Trigger points, tightness posterior capsule, infraspinatus.  10/31/2023 TTP: Rt upper trap, Rt bicep tendons. Rt deltoid, Rt supraspinatus.                                                                                                                                                                                                  TODAY'S TREATMENT:                                                                                            DATE: 05/08/2024:  Therex: UBE fwd/back 4 mins each way lvl 3.5 with 2 min rest break- performed with 15 second faster interval top of each minute.  Doorway Rt ER stretch 30 sec x 3  Neuro Re-ed (postural awareness/muscular recruitment, control) Tband rows blue 2 x 15 c scapular retraction Tband GH ext blue 2 x 15  Lat pull down for scapular mobility control blue band 2 x 15 seated.  Rt shoulder green band walk outs ER c towel under arm 5 sec hold x 15 Standing Rt shoulder 90 deg flexion ball wall circles 30 sec x 2 cw, ccw each    Manual: Percussive device to Rt posterior shoulder to help improve rotation mobility tolerance (ER/IR).   Rt upper trap as well.  Posterior glide with mobilization c movement ER/IR Rt shoulder.   Vasopneumatic Device Rt shoulder low compression 34 degrees 10 mins in sitting  TODAY'S TREATMENT:  DATE: 04/17/2024:  Therex: UBE fwd/back 3 mins each way lvl 3.0 with 1 min rest break- performed with 10 second faster interval top of each minute.  Sidelying Rt shoulder ER with towel under arm 3 x10  1 lb  Sidelying Rt  shoulder abduction 2 x 15 Sidelying Rt shoulder flexion 3 x 10  Supine cross arm stretch Rt 15 seconds x 3  Supine protraction 3 sec hold x 15 3 lbs Rt shoulder  Standing shoulder retraction 5 sec hold x 10    Manual: Percussive device to Rt posterior shoulder to help improve rotation mobility tolerance (ER/IR).   Rt upper trap as well.   Vasopneumatic Device Rt shoulder low compression 34 degrees 10 mins in sitting  TODAY'S TREATMENT:                                                                                            DATE: 04/11/2024:  Therex: UBE fwd/back 3 mins each way lvl 3.0 with 1 min rest break Cross arm stretch in supine Rt shoulder 15 sec x 5   TherActivity (to improve functional reaching/movement)  Standing Rt shoulder flexion eccentric only full range x 15 0 lb, x 15 1lb . Additional time for slow control movement focus.    Neuro Re-ed (to improve muscle activation, postural awareness/endurance/control) Stabilizations holds against moderate external resistance 20-30 sec bouts in 90 deg flexion Rt shoulder  Standing shoulder 90 deg flexion  2 lb weight ball circles 20 x 2 cw, ccw each - performed bilaterally  Standing tband rows blue band c scapular retraction focus 2 x 15   Manual: Percussive device to Rt posterior shoulder to help improve rotation mobility tolerance (ER/IR).     Vasopneumatic Device Rt shoulder low compression 34 degrees 10 mins in sitting  TODAY'S TREATMENT:                                                                                            DATE: 04/04/2024:  Therex: Pulleys flexion, scaption 5 sec hold to tolerance bilateral UE 3 mins each way  Sidelying Rt shoulder abduction AROM x 10  Verbal review of existing HEP routine.   Neuro Re-ed (to improve muscle activation, postural awareness/endurance/control) Tband green GH ext 3 sec hold past leg x 15  Standing green band ER walk outs 5 sec reactive isometric hold with towel under arm  x 15 bilateral  Standing green band rows c scapular retraction focus 2 x 15   Manual: Percussive device to Rt posterior shoulder to help improve rotation mobility tolerance (ER/IR).   After manual, improvement in symptoms with IR improved 20 deg post treatment.   Vasopneumatic Device Rt shoulder low compression 34 degrees 10 mins in sitting  TODAY'S TREATMENT:  DATE: 03/19/2024:  TherEx: UBE: level 2, x 3 minutes each direction Rows: blue TB 2 x 10 holding 3 sec Standing green band ER 2 x 10 (towel under elbow) Side-lying abduction 1# x 15  Side-lying IR stretch on Left shoulder x 4 holding 10 sec  ThereActivities:  UE ranger flexion x 15 UE ranger scaption x 15 Standing flexion c 1 # bar x 15 focus on eccentric lowering     PATIENT EDUCATION: 01/18/2024 Education details: HEP update Person educated: Patient Education method: Programmer, multimedia, Demonstration, Verbal cues, and Handouts Education comprehension: verbalized understanding, returned demonstration, and verbal cues required  HOME EXERCISE PROGRAM: Access Code: [email or web address] URL: [email or web address] Date: 01/18/2024 Prepared by: Bonna Bustard  Exercises - Supine Shoulder External Rotation in 45 Degrees Abduction AAROM with Dowel (Mirrored)  - 2-3 x daily - 7 x weekly - 2 sets - 10 reps - 2 seconds hold - Standing Shoulder Posterior Capsule Stretch (Mirrored)  - 2-3 x daily - 7 x weekly - 1 sets - 3 reps - 15-30 hold - Sleeper Stretch (Mirrored)  - 2-3 x daily - 7 x weekly - 1 sets - 3 reps - 30 hold - Standing Shoulder Row with Anchored Resistance  - 1 x daily - 7 x weekly - 1-2 sets - 10-15 reps - Shoulder Extension with Resistance  - 1 x daily - 7 x weekly - 1-2 sets - 10-15 reps - Shoulder External Rotation Reactive Isometrics (Mirrored)  - 1-2 x daily - 7 x weekly - 1 sets - 10 reps - 5-15 hold - Sidelying Shoulder  Abduction Palm Forward  - 1-2 x daily - 7 x weekly - 2-3 sets - 10-15 reps - Sidelying Shoulder Flexion 15 Degrees (Mirrored)  - 1-2 x daily - 7 x weekly - 2-3 sets - 10-15 reps  ASSESSMENT:  CLINICAL IMPRESSION: Noted improvement continued in Rt shoulder active range.  Continued fatigue present in strengthening/control activity but slowly progression in tolerance.  Continued emphasis on progressive strength improvements to continue to help improve use of Rt arm in daily life.   OBJECTIVE IMPAIRMENTS: decreased ROM, decreased strength, increased edema, impaired UE functional use, postural dysfunction, and pain.   ACTIVITY LIMITATIONS: carrying, sleeping, bathing, toileting, dressing, reach over head, and hygiene/grooming  PARTICIPATION LIMITATIONS: meal prep, cleaning, laundry, driving, shopping, community activity, and occupation  PERSONAL FACTORS: 3+ comorbidities: see pertinent history are also affecting patient's functional outcome.   REHAB POTENTIAL: Good  CLINICAL DECISION MAKING: Stable/uncomplicated  EVALUATION COMPLEXITY: Low   GOALS: Goals reviewed with patient? Yes  SHORT TERM GOALS: (target date for Short term goals are 3 weeks 11/24/23)  1.Patient will demonstrate independent use of home exercise program to maintain progress from in clinic treatments. Goal status: MET 11/14/23  LONG TERM GOALS: (target dates for all long term goals are 6 additional weeks  06/12/2024 )   1. Patient will demonstrate/report pain at worst less than or equal to 2/10 to facilitate minimal limitation in daily activity secondary to pain symptoms. Goal status: on going 05/08/2024   2. Patient will demonstrate independent use of home exercise program to facilitate ability to maintain/progress functional gains from skilled physical therapy services. Goal status  on going 05/08/2024   3. Patient will demonstrate FOTO outcome > or = 61 % to indicate reduced disability due to condition. Goal  status:met 01/18/2024   4.  Patient will demonstrate Rt UE MMT >/= 4/5 throughout to facilitate lifting, reaching, carrying at [facility] in daily activity.  Goal status:  on going 05/08/2024   5.  Patient will demonstrate Rt GH joint AROM WFL s symptoms to facilitate usual overhead reaching, self care, dressing at PLOF.    Goal status:  on going 05/08/2024   6.  Pt will improve her Rt shoulder flexion to >/= 140 degrees for improvements in ADL's.  Goal status: Met  05/08/2024  7. Pt will improve her Rt shoulder ER to >/= 60 degrees in order to improved functional actives.   Goal Status: Met 04/17/2024     PLAN:  PT FREQUENCY: 1x/week  PT DURATION: 8 weeks  PLANNED INTERVENTIONS: Can include 16109- PT Re-evaluation, 97110-Therapeutic exercises, 97530- Therapeutic activity, 97112- Neuromuscular re-education, 97535- Self Care, 97140- Manual therapy, [phone]- Gait training, [phone]- Orthotic Fit/training, [phone]- Canalith repositioning, [id number]- Aquatic Therapy, 97014- Electrical stimulation (unattended), [id number]- Electrical stimulation (manual), [id number]- Vasopneumatic device, [id number]- Ultrasound, [id number]- Traction (mechanical), [id number]- Ionotophoresis 4mg /ml Dexamethasone , Patient/Family education, Balance training, Stair training, Taping, Dry Needling, Joint mobilization, Joint manipulation, Spinal manipulation, Spinal mobilization, Scar mobilization, Vestibular training, Visual/preceptual remediation/compensation, DME instructions, Cryotherapy, and Moist heat.  All performed as medically necessary.  All included unless contraindicated  PLAN FOR NEXT SESSION: Strengthening/endurance.  Manual for symptom relief.    Bonna Bustard, PT, DPT, OCS, ATC 05/08/24  9:44 AM

## 2024-05-15 ENCOUNTER — Ambulatory Visit (INDEPENDENT_AMBULATORY_CARE_PROVIDER_SITE_OTHER): Admitting: Obstetrics and Gynecology

## 2024-05-15 ENCOUNTER — Other Ambulatory Visit (HOSPITAL_COMMUNITY)
Admission: RE | Admit: 2024-05-15 | Discharge: 2024-05-15 | Disposition: A | Discharge status: Home / Self Care | Source: Ambulatory Visit | Attending: Obstetrics and Gynecology | Admitting: Obstetrics and Gynecology

## 2024-05-15 ENCOUNTER — Ambulatory Visit (INDEPENDENT_AMBULATORY_CARE_PROVIDER_SITE_OTHER): Admitting: Rehabilitative and Restorative Service Providers"

## 2024-05-15 ENCOUNTER — Encounter: Payer: Self-pay | Admitting: Rehabilitative and Restorative Service Providers"

## 2024-05-15 VITALS — BP 134/76 | HR 73 | Temp 98.5°F | Wt 146.8 lb

## 2024-05-15 DIAGNOSIS — R87610 Atypical squamous cells of undetermined significance on cytologic smear of cervix (ASC-US): Secondary | ICD-10-CM | POA: Diagnosis present

## 2024-05-15 DIAGNOSIS — M25511 Pain in right shoulder: Secondary | ICD-10-CM

## 2024-05-15 DIAGNOSIS — R600 Localized edema: Secondary | ICD-10-CM

## 2024-05-15 DIAGNOSIS — Z01812 Encounter for preprocedural laboratory examination: Secondary | ICD-10-CM | POA: Diagnosis not present

## 2024-05-15 DIAGNOSIS — R87810 Cervical high risk human papillomavirus (HPV) DNA test positive: Secondary | ICD-10-CM | POA: Insufficient documentation

## 2024-05-15 DIAGNOSIS — M6281 Muscle weakness (generalized): Secondary | ICD-10-CM | POA: Diagnosis not present

## 2024-05-15 LAB — PREGNANCY, URINE: Preg Test, Ur: NEGATIVE

## 2024-05-15 NOTE — Patient Instructions (Signed)
 It is common to have vaginal bleeding and cramping for up to 72 hours after your biopsy. Please call our office with heavy vaginal bleeding, severe abdominal pain or fever. Avoid intercourse, tampon use, douching and baths for 7 days to decrease the risk of infection.

## 2024-05-15 NOTE — Therapy (Signed)
 OUTPATIENT PHYSICAL THERAPY TREATMENT  Patient Name: Misty Bailey MRN: [id number] DOB:01/26/1971, 53 y.o., female Today's Date: 05/15/2024   END OF SESSION:  PT End of Session - 05/15/24 0900     Visit Number 31    Number of Visits 35    Date for PT Re-Evaluation 06/12/24    Authorization Type [employer] $40 copay    Progress Note Due on Visit 35    PT Start Time 0859    PT Stop Time 0937    PT Time Calculation (min) 38 min    Activity Tolerance Patient tolerated treatment well    Behavior During Therapy [facility] for tasks assessed/performed                  Past Medical History:  Diagnosis Date   Anemia    Anxiety    Arthritis 02/23/2014   Rib Cage   Asthma    Depression    Diverticulosis    Dysmenorrhea 05/08/2018   Essential hypertension 07/29/2019   HLD (hyperlipidemia)    Panic attacks    Pre-diabetes    Suppurative hidradenitis    Past Surgical History:  Procedure Laterality Date   CHOLECYSTECTOMY N/A 06/28/2023   Procedure: LAPAROSCOPIC CHOLECYSTECTOMY;  Surgeon: Oza Blumenthal, MD;  Location: [facility] OR;  Service: General;  Laterality: N/A;  TAP BLOCK   INGUINAL HERNIA REPAIR Left 06/28/2023   Procedure: OPEN LEFT INGUINAL HERNIA REPAIR WITH MESH;  Surgeon: Oza Blumenthal, MD;  Location: [facility] OR;  Service: General;  Laterality: Left;   oral surgery     PELVIC LAPAROSCOPY  1998/1999 `   pelvic adhesions and pain   TOE SURGERY Left    great toe   WISDOM TOOTH EXTRACTION     Patient Active Problem List   Diagnosis Date Noted   ASCUS with positive high risk HPV cervical 04/18/2024   Genitourinary syndrome of menopause 04/18/2024   Adhesive capsulitis of right shoulder 11/28/2023   B12 deficiency 11/28/2023   Neuropathy 06/22/2023   Inguinal hernia of left side without obstruction or gangrene 04/30/2020   Hyperlipidemia 07/30/2019   Syncope 07/29/2019   Essential hypertension 07/29/2019   Dysmenorrhea 05/08/2018   Herpes 03/21/2014   Suppurative  hidradenitis    Depression     PCP: Lawrance Presume, MD   REFERRING PROVIDER: Arnie Lao*   REFERRING DIAG:  Diagnosis  M24.611 (ICD-10-CM) - Arthrofibrosis of right shoulder  Z98.890 (ICD-10-CM) - Status post arthroscopy of right shoulder    THERAPY DIAG:  Acute pain of right shoulder  Muscle weakness (generalized)  Localized edema  Rationale for Evaluation and Treatment: Rehabilitation  ONSET DATE: February 2024  SUBJECTIVE:  SUBJECTIVE STATEMENT: Pt indicated stretching has been feeling better.   Pt indicated a little tight this morning from sleeping.   PERTINENT HISTORY: Pt s/p on 10/12/23 manipulation c debridement of anterior capsule,  inferior capsule and subacrominal space  PMH: anemia, anxiety, asthma, depression, diverticulosis, essential HTN, panic attacks, pre-diabetes, hernia repair, oral surgery, toe surgery, pelvic laparoscopy, cholecystectomy   PAIN:  NPRS scale: no specific pain number , "tight" Pain location: Rt shoulder Pain description: tight Aggravating factors: lifting my arm, ADL's Relieving factors: resting, pain meds as needed  PRECAUTIONS: None  WEIGHT BEARING RESTRICTIONS: No  FALLS:  Has patient fallen in last 6 months? Yes. Number of falls pt stating no injuries, pt stating she was dehydrated.   LIVING ENVIRONMENT: Lives with: lives with their family and lives alone Lives in: House/apartment Stairs: No Has following equipment at home: None  OCCUPATION: Out of work since May  PLOF: Independent  PATIENT GOALS: be able reach overhead   OBJECTIVE:   DIAGNOSTIC FINDINGS:  10/31/2023 review of chart:  IMPRESSION: 1. Mild tendinosis of the supraspinatus tendon anteriorly. 2. Mild subacromial/subdeltoid bursitis. 3. Mild relative  thickening of the inferior joint capsule as can be seen with adhesive capsulitis.  PATIENT SURVEYS:  FOTO discontinued by clinic.  01/18/2024:  FOTO update 62  12/18/2023: FOTO update 46%  11/21/23: FOTO update 48%  10/31/23: FOTO intake:  41%      COGNITION: 10/31/2023 Overall cognitive status: WFL     SENSATION: 10/31/2023 Tingling in Rt fingers, pt feels that may be due to B-12 deficiency  POSTURE 10/31/2023 Forward head and rounded shoulders  UPPER EXTREMITY ROM:   ROM (* indicated pain) Right 10/31/23 Left eval Right  PROM 11/07/23 Rt 11/14/23 PROM supine Rt 11/21/23 PROM supine Rt 11/29/2023 AROM Rt 12/05/23 Rt 12/12/23 Rt 12/26/23 supine Rt 01/02/24 supine Rt 01/09/24 supine Right 02/26/2024 Right 03/19/24 Right 04/17/2024 AROM in supine Right 05/08/2024 AROM in supine  Shoulder flexion 60 155 90* 110* 108 110 AA: 120 A: 124 A: 128 A: 125 A: 130 AROM in supine 130   AROM in standing against gravity: 125 deg AA: supine 140  143 AROM 150 AROM  Shoulder extension 15 40               Shoulder abduction 65 162 80* 100*    A: 100 A: 110 A: 112 A: 115 AROM in supine 125 AROM Supine 125 143 AROM    Shoulder adduction                 Shoulder internal rotation 50 75 60* 60 *          70 AROM in supine in 45 deg abduction: 80 AROM in supine in 45 deg abduction:  Shoulder external rotation 25 75 15-20* very guarded and pain limited  30 * 30   A: 50 A: 55 A: 54 A; 55 AROM in supine in 45 deg abduction: 65  70 AROM in supine in 45 deg abduction: 80 AROM in supine in 45 deg abduction:  (Blank rows = not tested)  UPPER EXTREMITY MMT:  MMT Right 10/31/2023 Left 10/31/2023 Rt 12/18/23 Rt 01/09/24 Right 01/18/2024 Right 03/04/2024 Right 04/04/2024  Left 04/04/2024 Right 04/17/2024   Shoulder flexion 2 5 3  3+ 4/5 4+/5 c pain 5/5 5/5 5/5   Shoulder extension 3 5 3+ 3+        Shoulder abduction 2 5 3  3+ 3+/5 4/5 c pain 4+/5 5/5 4+/5   Shoulder adduction  Shoulder internal rotation 2 5 3   5/5 5/5 5/5 5/5 5/5   Shoulder external rotation 2 5 3   4/5 4+/5 4/5 5/5 5/5   (Blank rows = not tested)   PALPATION:  05/01/2024:  Continued trigger points noted in Rt upper trap, infraspinatus but reducing in number.   04/11/2024:  Trigger points, tightness posterior capsule, infraspinatus.  10/31/2023 TTP: Rt upper trap, Rt bicep tendons. Rt deltoid, Rt supraspinatus.                                                                                                                                                                                                  TODAY'S TREATMENT:                                                                                            DATE: 05/15/2024:  Therex: Doorway Rt ER stretch 30 sec x 3 Tband green band ER ROM c towel under arm 2 x 10 c slow movement focus  Cross arm posterior stretch 30 sec x 3   TherActivity (to improve functional reach, liff Seated pulley flexion, scaption 3 mins 5 sec stretch at top with eccentric lowering control focus.  Rest break and technique review performed  Neuro Re-ed (postural awareness/muscular recruitment, control) Lat pull down for scapular mobility control blue band 2 x 15 seated.  Rt shoulder green band walk outs ER c towel under arm 5 sec hold x 10  Vasopneumatic Device Rt shoulder low compression 34 degrees 10 mins in sitting  TODAY'S TREATMENT:                                                                                            DATE: 05/08/2024:  Therex: UBE fwd/back 4 mins each way lvl 3.5 with 2 min rest break- performed with 15 second faster interval top of each minute.  Doorway Rt ER stretch 30 sec x 3  Neuro Re-ed (postural awareness/muscular recruitment, control) Tband rows blue 2 x 15 c scapular retraction Tband GH ext blue 2 x 15  Lat pull down for scapular mobility control blue band 2 x 15 seated.  Rt shoulder green band walk outs ER c towel under arm 5  sec hold x 15 Standing Rt shoulder 90 deg flexion ball wall circles 30 sec x 2 cw, ccw each    Manual: Percussive device to Rt posterior shoulder to help improve rotation mobility tolerance (ER/IR).   Rt upper trap as well.  Posterior glide with mobilization c movement ER/IR Rt shoulder.   Vasopneumatic Device Rt shoulder low compression 34 degrees 10 mins in sitting  TODAY'S TREATMENT:                                                                                            DATE: 04/17/2024:  Therex: UBE fwd/back 3 mins each way lvl 3.0 with 1 min rest break- performed with 10 second faster interval top of each minute.  Sidelying Rt shoulder ER with towel under arm 3 x10  1 lb  Sidelying Rt shoulder abduction 2 x 15 Sidelying Rt shoulder flexion 3 x 10  Supine cross arm stretch Rt 15 seconds x 3  Supine protraction 3 sec hold x 15 3 lbs Rt shoulder  Standing shoulder retraction 5 sec hold x 10    Manual: Percussive device to Rt posterior shoulder to help improve rotation mobility tolerance (ER/IR).   Rt upper trap as well.   Vasopneumatic Device Rt shoulder low compression 34 degrees 10 mins in sitting  TODAY'S TREATMENT:                                                                                            DATE: 04/11/2024:  Therex: UBE fwd/back 3 mins each way lvl 3.0 with 1 min rest break Cross arm stretch in supine Rt shoulder 15 sec x 5   TherActivity (to improve functional reaching/movement)  Standing Rt shoulder flexion eccentric only full range x 15 0 lb, x 15 1lb . Additional time for slow control movement focus.    Neuro Re-ed (to improve muscle activation, postural awareness/endurance/control) Stabilizations holds against moderate external resistance 20-30 sec bouts in 90 deg flexion Rt shoulder  Standing shoulder 90 deg flexion  2 lb weight ball circles 20 x 2 cw, ccw each - performed bilaterally  Standing tband rows blue band c scapular retraction focus 2 x 15    Manual: Percussive device to Rt posterior shoulder to help improve rotation mobility tolerance (ER/IR).     Vasopneumatic Device Rt shoulder low compression 34 degrees 10 mins in sitting   PATIENT EDUCATION: 01/18/2024 Education details: HEP  update Person educated: Patient Education method: Explanation, Demonstration, Verbal cues, and Handouts Education comprehension: verbalized understanding, returned demonstration, and verbal cues required  HOME EXERCISE PROGRAM: Access Code: [email or web address] URL: [email or web address] Date: 01/18/2024 Prepared by: Bonna Bustard  Exercises - Supine Shoulder External Rotation in 45 Degrees Abduction AAROM with Dowel (Mirrored)  - 2-3 x daily - 7 x weekly - 2 sets - 10 reps - 2 seconds hold - Standing Shoulder Posterior Capsule Stretch (Mirrored)  - 2-3 x daily - 7 x weekly - 1 sets - 3 reps - 15-30 hold - Sleeper Stretch (Mirrored)  - 2-3 x daily - 7 x weekly - 1 sets - 3 reps - 30 hold - Standing Shoulder Row with Anchored Resistance  - 1 x daily - 7 x weekly - 1-2 sets - 10-15 reps - Shoulder Extension with Resistance  - 1 x daily - 7 x weekly - 1-2 sets - 10-15 reps - Shoulder External Rotation Reactive Isometrics (Mirrored)  - 1-2 x daily - 7 x weekly - 1 sets - 10 reps - 5-15 hold - Sidelying Shoulder Abduction Palm Forward  - 1-2 x daily - 7 x weekly - 2-3 sets - 10-15 reps - Sidelying Shoulder Flexion 15 Degrees (Mirrored)  - 1-2 x daily - 7 x weekly - 2-3 sets - 10-15 reps  ASSESSMENT:  CLINICAL IMPRESSION: Decreased treatment time due to arrival time.  Continued emphasis in HEP on routine stretching to tightness but not pain increase.  Pt to benefit from continued functional strength and scapular control improvements.   OBJECTIVE IMPAIRMENTS: decreased ROM, decreased strength, increased edema, impaired UE functional use, postural dysfunction, and pain.   ACTIVITY LIMITATIONS: carrying, sleeping, bathing, toileting, dressing,  reach over head, and hygiene/grooming  PARTICIPATION LIMITATIONS: meal prep, cleaning, laundry, driving, shopping, community activity, and occupation  PERSONAL FACTORS: 3+ comorbidities: see pertinent history are also affecting patient's functional outcome.   REHAB POTENTIAL: Good  CLINICAL DECISION MAKING: Stable/uncomplicated  EVALUATION COMPLEXITY: Low   GOALS: Goals reviewed with patient? Yes  SHORT TERM GOALS: (target date for Short term goals are 3 weeks 11/24/23)  1.Patient will demonstrate independent use of home exercise program to maintain progress from in clinic treatments. Goal status: MET 11/14/23  LONG TERM GOALS: (target dates for all long term goals are 6 additional weeks  06/12/2024 )   1. Patient will demonstrate/report pain at worst less than or equal to 2/10 to facilitate minimal limitation in daily activity secondary to pain symptoms. Goal status: on going 05/08/2024   2. Patient will demonstrate independent use of home exercise program to facilitate ability to maintain/progress functional gains from skilled physical therapy services. Goal status  on going 05/08/2024   3. Patient will demonstrate FOTO outcome > or = 61 % to indicate reduced disability due to condition. Goal status:met 01/18/2024   4.  Patient will demonstrate Rt UE MMT >/= 4/5 throughout to facilitate lifting, reaching, carrying at [facility] in daily activity.   Goal status:  on going 05/08/2024   5.  Patient will demonstrate Rt GH joint AROM WFL s symptoms to facilitate usual overhead reaching, self care, dressing at PLOF.    Goal status:  on going 05/08/2024   6.  Pt will improve her Rt shoulder flexion to >/= 140 degrees for improvements in ADL's.  Goal status: Met  05/08/2024  7. Pt will improve her Rt shoulder ER to >/= 60 degrees in order to improved functional actives.   Goal Status: Met 04/17/2024  PLAN:  PT FREQUENCY: 1x/week  PT DURATION: 8 weeks  PLANNED INTERVENTIONS: Can  include 16109- PT Re-evaluation, 97110-Therapeutic exercises, 97530- Therapeutic activity, [id number]- Neuromuscular re-education, 97535- Self Care, 97140- Manual therapy, [phone]- Gait training, [phone]- Orthotic Fit/training, [phone]- Canalith repositioning, [id number]- Aquatic Therapy, 97014- Electrical stimulation (unattended), [id number]- Electrical stimulation (manual), [id number]- Vasopneumatic device, [id number]- Ultrasound, [id number]- Traction (mechanical), [id number]- Ionotophoresis 4mg /ml Dexamethasone , Patient/Family education, Balance training, Stair training, Taping, Dry Needling, Joint mobilization, Joint manipulation, Spinal manipulation, Spinal mobilization, Scar mobilization, Vestibular training, Visual/preceptual remediation/compensation, DME instructions, Cryotherapy, and Moist heat.  All performed as medically necessary.  All included unless contraindicated  PLAN FOR NEXT SESSION: Strengthening in functional movement patterns.    Bonna Bustard, PT, DPT, OCS, ATC 05/15/24  9:36 AM

## 2024-05-15 NOTE — Addendum Note (Signed)
 Addended by: Maximino Spanner on: 05/15/2024 01:34 PM   Modules accepted: Orders

## 2024-05-15 NOTE — Progress Notes (Signed)
 53 y.o. [id number] female with GSM, ASCUS+HPV, recurrent BV (on suppression) here for colposcopy. Single, relationship x47yr. Long term disability. History of cervical cryotherapy in her 50s.  Pt took 2 tylenol  500mg  each around 930am today. Pt is very nervous and is emotional.  Has questions about HPV. Partner has been unfaithful throughout their relationship.  No LMP recorded. (Menstrual status: Perimenopausal).    Last PAP:    Component Value Date/Time   DIAGPAP (A) 01/05/2024 1111    - Atypical squamous cells of undetermined significance (ASC-US )   DIAGPAP - Low grade squamous intraepithelial lesion (LSIL) (A) 10/24/2022 1621   DIAGPAP  11/07/2017 0000    NEGATIVE FOR INTRAEPITHELIAL LESIONS OR MALIGNANCY.   HPVHIGH Positive (A) 01/05/2024 1111   HPVHIGH Positive (A) 10/24/2022 1621   ADEQPAP  01/05/2024 1111    Satisfactory for evaluation; transformation zone component PRESENT.   ADEQPAP  10/24/2022 1621    Satisfactory for evaluation; transformation zone component PRESENT.   ADEQPAP  11/07/2017 0000    Satisfactory for evaluation  endocervical/transformation zone component PRESENT.   GYN HISTORY: Cyrotherapy in 2s Hx of PID, recurrent trich  OB History  Gravida Para Term Preterm AB Living  2 1  1 1  0  SAB IAB Ectopic Multiple Live Births  1        # Outcome Date GA Lbr Len/2nd Weight Sex Type Anes PTL Lv  2 Preterm           1 SAB             Past Medical History:  Diagnosis Date   Anemia    Anxiety    Arthritis 02/23/2014   Rib Cage   Asthma    Depression    Diverticulosis    Dysmenorrhea 05/08/2018   Essential hypertension 07/29/2019   HLD (hyperlipidemia)    Panic attacks    Pre-diabetes    Suppurative hidradenitis     Past Surgical History:  Procedure Laterality Date   CHOLECYSTECTOMY N/A 06/28/2023   Procedure: LAPAROSCOPIC CHOLECYSTECTOMY;  Surgeon: Oza Blumenthal, MD;  Location: [facility] OR;  Service: General;  Laterality: N/A;  TAP BLOCK    INGUINAL HERNIA REPAIR Left 06/28/2023   Procedure: OPEN LEFT INGUINAL HERNIA REPAIR WITH MESH;  Surgeon: Oza Blumenthal, MD;  Location: [facility] OR;  Service: General;  Laterality: Left;   oral surgery     PELVIC LAPAROSCOPY  1998/1999 `   pelvic adhesions and pain   TOE SURGERY Left    great toe   WISDOM TOOTH EXTRACTION      Current Outpatient Medications on File Prior to Visit  Medication Sig Dispense Refill   albuterol  (VENTOLIN  HFA) 108 (90 Base) MCG/ACT inhaler Inhale 2 puffs into the lungs every 6 (six) hours as needed for wheezing or shortness of breath. 8 g 4   amLODipine  (NORVASC ) 10 MG tablet Take 1 tablet (10 mg total) by mouth daily. 90 tablet 1   atorvastatin  (LIPITOR) 10 MG tablet TAKE 1 TABLET BY MOUTH EVERY DAY 90 tablet 1   desonide (DESOWEN) 0.05 % ointment Apply 1 Application topically daily as needed (rash).     estradiol  (ESTRACE  VAGINAL) 0.1 MG/GM vaginal cream Apply 1/2 gram to vulva nightly for 2 weeks then decrease to 1/2 gram to vulva two nights a week. Do not use applicator. 42.5 g 1   fluticasone  (FLONASE ) 50 MCG/ACT nasal spray Place 1 spray into both nostrils daily. 16 g 1   losartan  (COZAAR ) 25 MG tablet Take 1 tablet (  25 mg total) by mouth daily. 90 tablet 1   metroNIDAZOLE  (METROGEL ) 0.75 % vaginal gel Place 1 Applicatorful vaginally 2 (two) times a week for 52 doses. 6 months of treatment. 70 g 7   Current Facility-Administered Medications on File Prior to Visit  Medication Dose Route Frequency Provider Last Rate Last Admin   cyanocobalamin  (VITAMIN B12) injection 1,000 mcg  1,000 mcg Intramuscular Once         Allergies  Allergen Reactions   Codeine  Shortness Of Breath      PE Today's Vitals   05/15/24 1009  Weight: 146 lb 12.8 oz (66.6 kg)   Body mass index is 27.74 kg/m.  Physical Exam Vitals reviewed. Exam conducted with a chaperone present.  Constitutional:      General: She is not in acute distress.    Appearance: Normal appearance.   HENT:     Head: Normocephalic and atraumatic.     Nose: Nose normal.  Eyes:     Extraocular Movements: Extraocular movements intact.     Conjunctiva/sclera: Conjunctivae normal.  Pulmonary:     Effort: Pulmonary effort is normal.  Genitourinary:    General: Normal vulva.     Exam position: Lithotomy position.     Vagina: No vaginal discharge.     Cervix: Normal. No cervical motion tenderness, discharge or lesion.     Uterus: Normal. Not enlarged and not tender.      Adnexa: Right adnexa normal and left adnexa normal.     Comments: +vaginal atrophy, flush cervix Musculoskeletal:        General: Normal range of motion.     Cervical back: Normal range of motion.  Neurological:     General: No focal deficit present.     Mental Status: She is alert.  Psychiatric:        Mood and Affect: Mood normal.        Behavior: Behavior normal.     Colposcopy Procedure Consented for procedure.  Time out performed. Speculum placed in vagina.  Acetic acid 3% was applied to cervix.  Unsatisfactory colposcopy. TZ was NOT seen. Biopsies taken Yes.   Location(s) - 4:00  Findings: No AW changes, flat polypoid tissue at 4:00 ECC was performed.  Specimens to pathology separately.  Monsel's applied to biopsy areas.   Good hemostasis.  Minimal EBL. No complications.  Tolerated well.     Assessment and Plan:        Pre-procedure lab exam -     Urinalysis,Complete w/RFL Culture  ASCUS with positive high risk HPV cervical -     Colposcopy   Abnormal PAP results reviewed. ASCCP guidelines reviewed. Consents signed. Unsatisfactory colposcopy, ECC and biopsy performed. Aftercare instructions provided.  Will call with results. RTO in 6 months for f/u PAP and BV testing I also recommend completion of Gardasil vaccine up to age 9yo, avoidance of smoking, and use of condoms with new sexual partners to prevent progression of disease.    Romaine Closs, MD

## 2024-05-17 LAB — SURGICAL PATHOLOGY

## 2024-05-21 ENCOUNTER — Ambulatory Visit: Payer: Self-pay | Admitting: Obstetrics and Gynecology

## 2024-05-22 ENCOUNTER — Ambulatory Visit (INDEPENDENT_AMBULATORY_CARE_PROVIDER_SITE_OTHER): Admitting: Physical Therapy

## 2024-05-22 ENCOUNTER — Encounter: Payer: Self-pay | Admitting: Physical Therapy

## 2024-05-22 DIAGNOSIS — M6281 Muscle weakness (generalized): Secondary | ICD-10-CM | POA: Diagnosis not present

## 2024-05-22 DIAGNOSIS — R600 Localized edema: Secondary | ICD-10-CM

## 2024-05-22 DIAGNOSIS — M25511 Pain in right shoulder: Secondary | ICD-10-CM

## 2024-05-22 NOTE — Therapy (Addendum)
 OUTPATIENT PHYSICAL THERAPY TREATMENT  Patient Name: Misty Bailey MRN: [id number] DOB:July 17, 1971, 54 y.o., female Today's Date: 05/22/2024   END OF SESSION:  PT End of Session - 05/22/24 0926     Visit Number 32    Number of Visits 35    Date for PT Re-Evaluation 06/12/24    Authorization Type [employer] $40 copay    PT Start Time 0850    PT Stop Time 0938    PT Time Calculation (min) 48 min    Activity Tolerance Patient tolerated treatment well    Behavior During Therapy [place] for tasks assessed/performed                   Past Medical History:  Diagnosis Date   Anemia    Anxiety    Arthritis 02/23/2014   Rib Cage   Asthma    Depression    Diverticulosis    Dysmenorrhea 05/08/2018   Essential hypertension 07/29/2019   HLD (hyperlipidemia)    Panic attacks    Pre-diabetes    Suppurative hidradenitis    Past Surgical History:  Procedure Laterality Date   CHOLECYSTECTOMY N/A 06/28/2023   Procedure: LAPAROSCOPIC CHOLECYSTECTOMY;  Surgeon: Oza Blumenthal, MD;  Location: [facility] OR;  Service: General;  Laterality: N/A;  TAP BLOCK   INGUINAL HERNIA REPAIR Left 06/28/2023   Procedure: OPEN LEFT INGUINAL HERNIA REPAIR WITH MESH;  Surgeon: Oza Blumenthal, MD;  Location: [facility] OR;  Service: General;  Laterality: Left;   oral surgery     PELVIC LAPAROSCOPY  1998/1999 `   pelvic adhesions and pain   TOE SURGERY Left    great toe   WISDOM TOOTH EXTRACTION     Patient Active Problem List   Diagnosis Date Noted   ASCUS with positive high risk HPV cervical 04/18/2024   Genitourinary syndrome of menopause 04/18/2024   Adhesive capsulitis of right shoulder 11/28/2023   B12 deficiency 11/28/2023   Neuropathy 06/22/2023   Inguinal hernia of left side without obstruction or gangrene 04/30/2020   Hyperlipidemia 07/30/2019   Syncope 07/29/2019   Essential hypertension 07/29/2019   Dysmenorrhea 05/08/2018   Herpes 03/21/2014   Suppurative hidradenitis    Depression      PCP: Lawrance Presume, MD   REFERRING PROVIDER: Arnie Lao*   REFERRING DIAG:  Diagnosis  M24.611 (ICD-10-CM) - Arthrofibrosis of right shoulder  Z98.890 (ICD-10-CM) - Status post arthroscopy of right shoulder    THERAPY DIAG:  Acute pain of right shoulder  Muscle weakness (generalized)  Localized edema  Rationale for Evaluation and Treatment: Rehabilitation  ONSET DATE: February 2024  SUBJECTIVE:  SUBJECTIVE STATEMENT: Pt indicated stretching has been feeling better.   Pt indicated a little tight this morning from sleeping.   PERTINENT HISTORY: Pt s/p on 10/12/23 manipulation c debridement of anterior capsule,  inferior capsule and subacrominal space  PMH: anemia, anxiety, asthma, depression, diverticulosis, essential HTN, panic attacks, pre-diabetes, hernia repair, oral surgery, toe surgery, pelvic laparoscopy, cholecystectomy   PAIN:  NPRS scale: no specific pain number , "tight" Pain location: Rt shoulder Pain description: tight Aggravating factors: lifting my arm, ADL's Relieving factors: resting, pain meds as needed  PRECAUTIONS: None  WEIGHT BEARING RESTRICTIONS: No  FALLS:  Has patient fallen in last 6 months? Yes. Number of falls pt stating no injuries, pt stating she was dehydrated.   LIVING ENVIRONMENT: Lives with: lives with their family and lives alone Lives in: House/apartment Stairs: No Has following equipment at home: None  OCCUPATION: Out of work since May  PLOF: Independent  PATIENT GOALS: be able reach overhead   OBJECTIVE:   DIAGNOSTIC FINDINGS:  10/31/2023 review of chart:  IMPRESSION: 1. Mild tendinosis of the supraspinatus tendon anteriorly. 2. Mild subacromial/subdeltoid bursitis. 3. Mild relative thickening of the inferior joint  capsule as can be seen with adhesive capsulitis.  PATIENT SURVEYS:  FOTO discontinued by clinic.  01/18/2024:  FOTO update 62  12/18/2023: FOTO update 46%  11/21/23: FOTO update 48%  10/31/23: FOTO intake:  41%      COGNITION: 10/31/2023 Overall cognitive status: WFL     SENSATION: 10/31/2023 Tingling in Rt fingers, pt feels that may be due to B-12 deficiency  POSTURE 10/31/2023 Forward head and rounded shoulders  UPPER EXTREMITY ROM:   ROM (* indicated pain) Right 10/31/23 Left eval Right  PROM 11/07/23 Rt 11/14/23 PROM supine Rt 11/21/23 PROM supine Rt 11/29/2023 AROM Rt 12/05/23 Rt 12/12/23 Rt 12/26/23 supine Rt 01/02/24 supine Rt 01/09/24 supine Right 02/26/2024 Right 03/19/24 Right 04/17/2024 AROM in supine Right 05/08/2024 AROM in supine  Shoulder flexion 60 155 90* 110* 108 110 AA: 120 A: 124 A: 128 A: 125 A: 130 AROM in supine 130   AROM in standing against gravity: 125 deg AA: supine 140  143 AROM 150 AROM  Shoulder extension 15 40               Shoulder abduction 65 162 80* 100*    A: 100 A: 110 A: 112 A: 115 AROM in supine 125 AROM Supine 125 143 AROM    Shoulder adduction                 Shoulder internal rotation 50 75 60* 60 *          70 AROM in supine in 45 deg abduction: 80 AROM in supine in 45 deg abduction:  Shoulder external rotation 25 75 15-20* very guarded and pain limited  30 * 30   A: 50 A: 55 A: 54 A; 55 AROM in supine in 45 deg abduction: 65  70 AROM in supine in 45 deg abduction: 80 AROM in supine in 45 deg abduction:  (Blank rows = not tested)  UPPER EXTREMITY MMT:  MMT Right 10/31/2023 Left 10/31/2023 Rt 12/18/23 Rt 01/09/24 Right 01/18/2024 Right 03/04/2024 Right 04/04/2024  Left 04/04/2024 Right 04/17/2024   Shoulder flexion 2 5 3  3+ 4/5 4+/5 c pain 5/5 5/5 5/5   Shoulder extension 3 5 3+ 3+        Shoulder abduction 2 5 3  3+ 3+/5 4/5 c pain 4+/5 5/5 4+/5   Shoulder adduction  Shoulder internal rotation 2 5  3   5/5 5/5 5/5 5/5 5/5   Shoulder external rotation 2 5 3   4/5 4+/5 4/5 5/5 5/5   (Blank rows = not tested)   PALPATION:  05/01/2024:  Continued trigger points noted in Rt upper trap, infraspinatus but reducing in number.   04/11/2024:  Trigger points, tightness posterior capsule, infraspinatus.  10/31/2023 TTP: Rt upper trap, Rt bicep tendons. Rt deltoid, Rt supraspinatus.                                                                                                                                                                                                  TODAY'S TREATMENT:                                                                                            DATE: 05/22/2024:  Therex:  Doorway bil ER stretch 30 sec x 3 Tband green band ER ROM c towel under arm 2 x 10 c slow movement focus  Cross arm posterior stretch 30 sec x 3  UBE: level 3 x 3 minutes each direction  TherActivity (to improve functional reach, liff Seated pulley flexion, scaption x 3 mins c 5 sec end range stretch and eccentric lowering  Neuro Re-ed (postural awareness/muscular recruitment, control) Rt shoulder green band walk outs ER c towel under arm 5 sec hold x 10 Wall walk ups c red TB around bil UE's  x 5   Vasopneumatic Device Rt shoulder low compression 34 degrees 10 mins in sitting      TODAY'S TREATMENT:                                                                                            DATE: 05/15/2024:  Therex: Doorway Rt ER stretch 30 sec x 3 Tband green band ER ROM c towel under arm  2 x 10 c slow movement focus  Cross arm posterior stretch 30 sec x 3   TherActivity (to improve functional reach, liff Seated pulley flexion, scaption 3 mins 5 sec stretch at top with eccentric lowering control focus.  Rest break and technique review performed  Neuro Re-ed (postural awareness/muscular recruitment, control) Lat pull down for scapular mobility control blue band 2 x 15 seated.  Rt  shoulder green band walk outs ER c towel under arm 5 sec hold x 10  Vasopneumatic Device Rt shoulder low compression 34 degrees 10 mins in sitting  TODAY'S TREATMENT:                                                                                            DATE: 05/08/2024:  Therex: UBE fwd/back 4 mins each way lvl 3.5 with 2 min rest break- performed with 15 second faster interval top of each minute.  Doorway Rt ER stretch 30 sec x 3  Neuro Re-ed (postural awareness/muscular recruitment, control) Tband rows blue 2 x 15 c scapular retraction Tband GH ext blue 2 x 15  Lat pull down for scapular mobility control blue band 2 x 15 seated.  Rt shoulder green band walk outs ER c towel under arm 5 sec hold x 15 Standing Rt shoulder 90 deg flexion ball wall circles 30 sec x 2 cw, ccw each    Manual: Percussive device to Rt posterior shoulder to help improve rotation mobility tolerance (ER/IR).   Rt upper trap as well.  Posterior glide with mobilization c movement ER/IR Rt shoulder.   Vasopneumatic Device Rt shoulder low compression 34 degrees 10 mins in sitting  TODAY'S TREATMENT:                                                                                            DATE: 04/17/2024:  Therex: UBE fwd/back 3 mins each way lvl 3.0 with 1 min rest break- performed with 10 second faster interval top of each minute.  Sidelying Rt shoulder ER with towel under arm 3 x10  1 lb  Sidelying Rt shoulder abduction 2 x 15 Sidelying Rt shoulder flexion 3 x 10  Supine cross arm stretch Rt 15 seconds x 3  Supine protraction 3 sec hold x 15 3 lbs Rt shoulder  Standing shoulder retraction 5 sec hold x 10    Manual: Percussive device to Rt posterior shoulder to help improve rotation mobility tolerance (ER/IR).   Rt upper trap as well.   Vasopneumatic Device Rt shoulder low compression 34 degrees 10 mins in sitting  TODAY'S TREATMENT:  DATE: 04/11/2024:  Therex: UBE fwd/back 3 mins each way lvl 3.0 with 1 min rest break Cross arm stretch in supine Rt shoulder 15 sec x 5   TherActivity (to improve functional reaching/movement)  Standing Rt shoulder flexion eccentric only full range x 15 0 lb, x 15 1lb . Additional time for slow control movement focus.    Neuro Re-ed (to improve muscle activation, postural awareness/endurance/control) Stabilizations holds against moderate external resistance 20-30 sec bouts in 90 deg flexion Rt shoulder  Standing shoulder 90 deg flexion  2 lb weight ball circles 20 x 2 cw, ccw each - performed bilaterally  Standing tband rows blue band c scapular retraction focus 2 x 15   Manual: Percussive device to Rt posterior shoulder to help improve rotation mobility tolerance (ER/IR).     Vasopneumatic Device Rt shoulder low compression 34 degrees 10 mins in sitting   PATIENT EDUCATION: 01/18/2024 Education details: HEP update Person educated: Patient Education method: Explanation, Demonstration, Verbal cues, and Handouts Education comprehension: verbalized understanding, returned demonstration, and verbal cues required  HOME EXERCISE PROGRAM: Access Code: [email or web address] URL: [email or web address] Date: 01/18/2024 Prepared by: Bonna Bustard  Exercises - Supine Shoulder External Rotation in 45 Degrees Abduction AAROM with Dowel (Mirrored)  - 2-3 x daily - 7 x weekly - 2 sets - 10 reps - 2 seconds hold - Standing Shoulder Posterior Capsule Stretch (Mirrored)  - 2-3 x daily - 7 x weekly - 1 sets - 3 reps - 15-30 hold - Sleeper Stretch (Mirrored)  - 2-3 x daily - 7 x weekly - 1 sets - 3 reps - 30 hold - Standing Shoulder Row with Anchored Resistance  - 1 x daily - 7 x weekly - 1-2 sets - 10-15 reps - Shoulder Extension with Resistance  - 1 x daily - 7 x weekly - 1-2 sets - 10-15 reps - Shoulder External Rotation Reactive Isometrics (Mirrored)  - 1-2 x daily - 7  x weekly - 1 sets - 10 reps - 5-15 hold - Sidelying Shoulder Abduction Palm Forward  - 1-2 x daily - 7 x weekly - 2-3 sets - 10-15 reps - Sidelying Shoulder Flexion 15 Degrees (Mirrored)  - 1-2 x daily - 7 x weekly - 2-3 sets - 10-15 reps  ASSESSMENT:  CLINICAL IMPRESSION: Pt requiring instructions in pain free movement to prevent compensation. Pt still reporting 3/10 and worse pain is 6/10 with end range movements in flexion, abd, ER, and extension. Pt still reporting most difficulty with reaching bra strap. Pt to benefit from continued functional strength and scapular control improvements.   OBJECTIVE IMPAIRMENTS: decreased ROM, decreased strength, increased edema, impaired UE functional use, postural dysfunction, and pain.   ACTIVITY LIMITATIONS: carrying, sleeping, bathing, toileting, dressing, reach over head, and hygiene/grooming  PARTICIPATION LIMITATIONS: meal prep, cleaning, laundry, driving, shopping, community activity, and occupation  PERSONAL FACTORS: 3+ comorbidities: see pertinent history are also affecting patient's functional outcome.   REHAB POTENTIAL: Good  CLINICAL DECISION MAKING: Stable/uncomplicated  EVALUATION COMPLEXITY: Low   GOALS: Goals reviewed with patient? Yes  SHORT TERM GOALS: (target date for Short term goals are 3 weeks 11/24/23)  1.Patient will demonstrate independent use of home exercise program to maintain progress from in clinic treatments. Goal status: MET 11/14/23  LONG TERM GOALS: (target dates for all long term goals are 6 additional weeks  06/12/2024 )   1. Patient will demonstrate/report pain at worst less than or equal to 2/10 to facilitate minimal limitation in daily activity secondary to  pain symptoms. Goal status: on going 05/08/2024   2. Patient will demonstrate independent use of home exercise program to facilitate ability to maintain/progress functional gains from skilled physical therapy services. Goal status  on going  05/08/2024   3. Patient will demonstrate FOTO outcome > or = 61 % to indicate reduced disability due to condition. Goal status:met 01/18/2024   4.  Patient will demonstrate Rt UE MMT >/= 4/5 throughout to facilitate lifting, reaching, carrying at [facility] in daily activity.   Goal status:  on going 05/08/2024   5.  Patient will demonstrate Rt GH joint AROM WFL s symptoms to facilitate usual overhead reaching, self care, dressing at PLOF.    Goal status:  on going 05/08/2024   6.  Pt will improve her Rt shoulder flexion to >/= 140 degrees for improvements in ADL's.  Goal status: Met  05/08/2024  7. Pt will improve her Rt shoulder ER to >/= 60 degrees in order to improved functional actives.   Goal Status: Met 04/17/2024     PLAN:  PT FREQUENCY: 1x/week  PT DURATION: 8 weeks  PLANNED INTERVENTIONS: Can include [id number]- PT Re-evaluation, 97110-Therapeutic exercises, 97530- Therapeutic activity, 97112- Neuromuscular re-education, 97535- Self Care, 97140- Manual therapy, [phone]- Gait training, [phone]- Orthotic Fit/training, [phone]- Canalith repositioning, [id number]- Aquatic Therapy, 97014- Electrical stimulation (unattended), [id number]- Electrical stimulation (manual), [id number]- Vasopneumatic device, [id number]- Ultrasound, [id number]- Traction (mechanical), [id number]- Ionotophoresis 4mg /ml Dexamethasone , Patient/Family education, Balance training, Stair training, Taping, Dry Needling, Joint mobilization, Joint manipulation, Spinal manipulation, Spinal mobilization, Scar mobilization, Vestibular training, Visual/preceptual remediation/compensation, DME instructions, Cryotherapy, and Moist heat.  All performed as medically necessary.  All included unless contraindicated  PLAN FOR NEXT SESSION: Strengthening in functional movement patterns.   Jerrel Mor, PT, MPT 05/22/24 9:35 AM   05/22/24  9:35 AM

## 2024-05-28 NOTE — Therapy (Signed)
 OUTPATIENT PHYSICAL THERAPY TREATMENT  Patient Name: Misty Bailey MRN: [id number] DOB:1971-11-17, 53 y.o., female Today's Date: 05/29/2024   END OF SESSION:  PT End of Session - 05/29/24 0850     Visit Number 33    Number of Visits 35    Date for PT Re-Evaluation 06/12/24    Authorization Type [employer] $40 copay    Progress Note Due on Visit 35    PT Start Time 0844    PT Stop Time 0933    PT Time Calculation (min) 49 min    Activity Tolerance Patient limited by fatigue    Behavior During Therapy [facility] for tasks assessed/performed                    Past Medical History:  Diagnosis Date   Anemia    Anxiety    Arthritis 02/23/2014   Rib Cage   Asthma    Depression    Diverticulosis    Dysmenorrhea 05/08/2018   Essential hypertension 07/29/2019   HLD (hyperlipidemia)    Panic attacks    Pre-diabetes    Suppurative hidradenitis    Past Surgical History:  Procedure Laterality Date   CHOLECYSTECTOMY N/A 06/28/2023   Procedure: LAPAROSCOPIC CHOLECYSTECTOMY;  Surgeon: Oza Blumenthal, MD;  Location: [facility] OR;  Service: General;  Laterality: N/A;  TAP BLOCK   INGUINAL HERNIA REPAIR Left 06/28/2023   Procedure: OPEN LEFT INGUINAL HERNIA REPAIR WITH MESH;  Surgeon: Oza Blumenthal, MD;  Location: [facility] OR;  Service: General;  Laterality: Left;   oral surgery     PELVIC LAPAROSCOPY  1998/1999 `   pelvic adhesions and pain   TOE SURGERY Left    great toe   WISDOM TOOTH EXTRACTION     Patient Active Problem List   Diagnosis Date Noted   ASCUS with positive high risk HPV cervical 04/18/2024   Genitourinary syndrome of menopause 04/18/2024   Adhesive capsulitis of right shoulder 11/28/2023   B12 deficiency 11/28/2023   Neuropathy 06/22/2023   Inguinal hernia of left side without obstruction or gangrene 04/30/2020   Hyperlipidemia 07/30/2019   Syncope 07/29/2019   Essential hypertension 07/29/2019   Dysmenorrhea 05/08/2018   Herpes 03/21/2014   Suppurative  hidradenitis    Depression     PCP: Lawrance Presume, MD   REFERRING PROVIDER: Arnie Lao*   REFERRING DIAG:  Diagnosis  M24.611 (ICD-10-CM) - Arthrofibrosis of right shoulder  Z98.890 (ICD-10-CM) - Status post arthroscopy of right shoulder    THERAPY DIAG:  Acute pain of right shoulder  Muscle weakness (generalized)  Localized edema  Rationale for Evaluation and Treatment: Rehabilitation  ONSET DATE: February 2024  SUBJECTIVE:  SUBJECTIVE STATEMENT: Pt indicated feeling like she has been getting improvements in motion but still feeling tight at times.  Reported having some pain in leg today.   PERTINENT HISTORY: Pt s/p on 10/12/23 manipulation c debridement of anterior capsule,  inferior capsule and subacrominal space  PMH: anemia, anxiety, asthma, depression, diverticulosis, essential HTN, panic attacks, pre-diabetes, hernia repair, oral surgery, toe surgery, pelvic laparoscopy, cholecystectomy   PAIN:  NPRS scale: no specific pain number Pain location: Rt shoulder Pain description: tight Aggravating factors: lifting my arm, ADL's Relieving factors: resting, pain meds as needed  PRECAUTIONS: None  WEIGHT BEARING RESTRICTIONS: No  FALLS:  Has patient fallen in last 6 months? Yes. Number of falls pt stating no injuries, pt stating she was dehydrated.   LIVING ENVIRONMENT: Lives with: lives with their family and lives alone Lives in: House/apartment Stairs: No Has following equipment at home: None  OCCUPATION: Out of work since May  PLOF: Independent  PATIENT GOALS: be able reach overhead   OBJECTIVE:   DIAGNOSTIC FINDINGS:  10/31/2023 review of chart:  IMPRESSION: 1. Mild tendinosis of the supraspinatus tendon anteriorly. 2. Mild subacromial/subdeltoid  bursitis. 3. Mild relative thickening of the inferior joint capsule as can be seen with adhesive capsulitis.  PATIENT SURVEYS:  FOTO discontinued by clinic.  01/18/2024:  FOTO update 62  12/18/2023: FOTO update 46%  11/21/23: FOTO update 48%  10/31/23: FOTO intake:  41%      COGNITION: 10/31/2023 Overall cognitive status: WFL     SENSATION: 10/31/2023 Tingling in Rt fingers, pt feels that may be due to B-12 deficiency  POSTURE 10/31/2023 Forward head and rounded shoulders  UPPER EXTREMITY ROM:   ROM (* indicated pain) Right 10/31/23 Left eval Right  PROM 11/07/23 Rt 11/14/23 PROM supine Rt 11/21/23 PROM supine Rt 11/29/2023 AROM Rt 12/05/23 Rt 12/12/23 Rt 12/26/23 supine Rt 01/02/24 supine Rt 01/09/24 supine Right 02/26/2024 Right 03/19/24 Right 04/17/2024 AROM in supine Right 05/08/2024 AROM in supine Right 05/29/2024  Shoulder flexion 60 155 90* 110* 108 110 AA: 120 A: 124 A: 128 A: 125 A: 130 AROM in supine 130   AROM in standing against gravity: 125 deg AA: supine 140  143 AROM 150 AROM 160 AROM in standing  Shoulder extension 15 40                Shoulder abduction 65 162 80* 100*    A: 100 A: 110 A: 112 A: 115 AROM in supine 125 AROM Supine 125 143 AROM     Shoulder adduction                  Shoulder internal rotation 50 75 60* 60 *          70 AROM in supine in 45 deg abduction: 80 AROM in supine in 45 deg abduction:   Shoulder external rotation 25 75 15-20* very guarded and pain limited  30 * 30   A: 50 A: 55 A: 54 A; 55 AROM in supine in 45 deg abduction: 65  70 AROM in supine in 45 deg abduction: 80 AROM in supine in 45 deg abduction:   (Blank rows = not tested)  UPPER EXTREMITY MMT:  MMT Right 10/31/2023 Left 10/31/2023 Rt 12/18/23 Rt 01/09/24 Right 01/18/2024 Right 03/04/2024 Right 04/04/2024  Left 04/04/2024 Right 04/17/2024   Shoulder flexion 2 5 3  3+ 4/5 4+/5 c pain 5/5 5/5 5/5   Shoulder extension 3 5 3+ 3+        Shoulder  abduction 2  5 3  3+ 3+/5 4/5 c pain 4+/5 5/5 4+/5   Shoulder adduction            Shoulder internal rotation 2 5 3   5/5 5/5 5/5 5/5 5/5   Shoulder external rotation 2 5 3   4/5 4+/5 4/5 5/5 5/5   (Blank rows = not tested)   PALPATION:  05/01/2024:  Continued trigger points noted in Rt upper trap, infraspinatus but reducing in number.   04/11/2024:  Trigger points, tightness posterior capsule, infraspinatus.  10/31/2023 TTP: Rt upper trap, Rt bicep tendons. Rt deltoid, Rt supraspinatus.                                                                                                                                                                                                  TODAY'S TREATMENT:                                                                                            DATE: 05/28/2024:  Therex: UBE fwd/back 4 mins each way lvl 3.5 with 1 min rest between directions Wall slide flexion c lift off 2 sec hold x 10 bilaterally performed  Cross arm stretch Rt 15 sec x 5 at times throughout visit. Wall push up with SA press hold 2-3 sec, x 15 reps   TherActivity UE ranger flexion, scaption 2-3 sec hold x 15 each with Rt arm Standing full range flexion elevation Rt arm 1 lb weight to fatigue x9,  Standing flexion to 90 deg to abduction to side and reverse x 10 bilaterally    Vasopneumatic Device Rt shoulder low compression 34 degrees 10 mins in sitting   TODAY'S TREATMENT:                                                                                            DATE:  05/22/2024:  Therex:  Doorway bil ER stretch 30 sec x 3 Tband green band ER ROM c towel under arm 2 x 10 c slow movement focus  Cross arm posterior stretch 30 sec x 3  UBE: level 3 x 3 minutes each direction  TherActivity (to improve functional reach, liff Seated pulley flexion, scaption x 3 mins c 5 sec end range stretch and eccentric lowering  Neuro Re-ed (postural awareness/muscular recruitment, control) Rt shoulder  green band walk outs ER c towel under arm 5 sec hold x 10 Wall walk ups c red TB around bil UE's  x 5   Vasopneumatic Device Rt shoulder low compression 34 degrees 10 mins in sitting   TODAY'S TREATMENT:                                                                                            DATE: 05/15/2024:  Therex: Doorway Rt ER stretch 30 sec x 3 Tband green band ER ROM c towel under arm 2 x 10 c slow movement focus  Cross arm posterior stretch 30 sec x 3   TherActivity (to improve functional reach, liff Seated pulley flexion, scaption 3 mins 5 sec stretch at top with eccentric lowering control focus.  Rest break and technique review performed  Neuro Re-ed (postural awareness/muscular recruitment, control) Lat pull down for scapular mobility control blue band 2 x 15 seated.  Rt shoulder green band walk outs ER c towel under arm 5 sec hold x 10  Vasopneumatic Device Rt shoulder low compression 34 degrees 10 mins in sitting  TODAY'S TREATMENT:                                                                                            DATE: 05/08/2024:  Therex: UBE fwd/back 4 mins each way lvl 3.5 with 2 min rest break- performed with 15 second faster interval top of each minute.  Doorway Rt ER stretch 30 sec x 3  Neuro Re-ed (postural awareness/muscular recruitment, control) Tband rows blue 2 x 15 c scapular retraction Tband GH ext blue 2 x 15  Lat pull down for scapular mobility control blue band 2 x 15 seated.  Rt shoulder green band walk outs ER c towel under arm 5 sec hold x 15 Standing Rt shoulder 90 deg flexion ball wall circles 30 sec x 2 cw, ccw each    Manual: Percussive device to Rt posterior shoulder to help improve rotation mobility tolerance (ER/IR).   Rt upper trap as well.  Posterior glide with mobilization c movement ER/IR Rt shoulder.   Vasopneumatic Device Rt shoulder low compression 34 degrees 10 mins in sitting  PATIENT EDUCATION: 01/18/2024 Education  details: HEP update Person educated: Patient Education method: Explanation, Demonstration, Verbal cues, and Handouts  Education comprehension: verbalized understanding, returned demonstration, and verbal cues required  HOME EXERCISE PROGRAM: Access Code: [email or web address] URL: [email or web address] Date: 01/18/2024 Prepared by: Bonna Bustard  Exercises - Supine Shoulder External Rotation in 45 Degrees Abduction AAROM with Dowel (Mirrored)  - 2-3 x daily - 7 x weekly - 2 sets - 10 reps - 2 seconds hold - Standing Shoulder Posterior Capsule Stretch (Mirrored)  - 2-3 x daily - 7 x weekly - 1 sets - 3 reps - 15-30 hold - Sleeper Stretch (Mirrored)  - 2-3 x daily - 7 x weekly - 1 sets - 3 reps - 30 hold - Standing Shoulder Row with Anchored Resistance  - 1 x daily - 7 x weekly - 1-2 sets - 10-15 reps - Shoulder Extension with Resistance  - 1 x daily - 7 x weekly - 1-2 sets - 10-15 reps - Shoulder External Rotation Reactive Isometrics (Mirrored)  - 1-2 x daily - 7 x weekly - 1 sets - 10 reps - 5-15 hold - Sidelying Shoulder Abduction Palm Forward  - 1-2 x daily - 7 x weekly - 2-3 sets - 10-15 reps - Sidelying Shoulder Flexion 15 Degrees (Mirrored)  - 1-2 x daily - 7 x weekly - 2-3 sets - 10-15 reps  ASSESSMENT:  CLINICAL IMPRESSION: Continued improvement in active range against gravity with best flexion measurement noted today.  Fatigue still noted quickly in strengthening intervention but improving slowly compared to previously.  Pt making progress towards idea of HEP transitioning for long term management.   OBJECTIVE IMPAIRMENTS: decreased ROM, decreased strength, increased edema, impaired UE functional use, postural dysfunction, and pain.   ACTIVITY LIMITATIONS: carrying, sleeping, bathing, toileting, dressing, reach over head, and hygiene/grooming  PARTICIPATION LIMITATIONS: meal prep, cleaning, laundry, driving, shopping, community activity, and occupation  PERSONAL FACTORS: 3+  comorbidities: see pertinent history are also affecting patient's functional outcome.   REHAB POTENTIAL: Good  CLINICAL DECISION MAKING: Stable/uncomplicated  EVALUATION COMPLEXITY: Low   GOALS: Goals reviewed with patient? Yes  SHORT TERM GOALS: (target date for Short term goals are 3 weeks 11/24/23)  1.Patient will demonstrate independent use of home exercise program to maintain progress from in clinic treatments. Goal status: MET 11/14/23  LONG TERM GOALS: (target dates for all long term goals are 6 additional weeks  06/12/2024 )   1. Patient will demonstrate/report pain at worst less than or equal to 2/10 to facilitate minimal limitation in daily activity secondary to pain symptoms. Goal status: on going 05/08/2024   2. Patient will demonstrate independent use of home exercise program to facilitate ability to maintain/progress functional gains from skilled physical therapy services. Goal status  on going 05/08/2024   3. Patient will demonstrate FOTO outcome > or = 61 % to indicate reduced disability due to condition. Goal status:met 01/18/2024   4.  Patient will demonstrate Rt UE MMT >/= 4/5 throughout to facilitate lifting, reaching, carrying at [facility] in daily activity.   Goal status:  on going 05/08/2024   5.  Patient will demonstrate Rt GH joint AROM WFL s symptoms to facilitate usual overhead reaching, self care, dressing at PLOF.    Goal status:  on going 05/08/2024   6.  Pt will improve her Rt shoulder flexion to >/= 140 degrees for improvements in ADL's.  Goal status: Met  05/08/2024  7. Pt will improve her Rt shoulder ER to >/= 60 degrees in order to improved functional actives.   Goal Status: Met 04/17/2024     PLAN:  PT FREQUENCY: 1x/week  PT DURATION: 8 weeks  PLANNED INTERVENTIONS: Can include 97146- PT Re-evaluation, 97110-Therapeutic exercises, 97530- Therapeutic activity, [id number]- Neuromuscular re-education, 97535- Self Care, 97140- Manual therapy, [phone]-  Gait training, [phone]- Orthotic Fit/training, [phone]- Canalith repositioning, [id number]- Aquatic Therapy, 97014- Electrical stimulation (unattended), [id number]- Electrical stimulation (manual), [id number]- Vasopneumatic device, [id number]- Ultrasound, [id number]- Traction (mechanical), [id number]- Ionotophoresis 4mg /ml Dexamethasone , Patient/Family education, Balance training, Stair training, Taping, Dry Needling, Joint mobilization, Joint manipulation, Spinal manipulation, Spinal mobilization, Scar mobilization, Vestibular training, Visual/preceptual remediation/compensation, DME instructions, Cryotherapy, and Moist heat.  All performed as medically necessary.  All included unless contraindicated  PLAN FOR NEXT SESSION: Continue prep for possible HEP transitioning.      Bonna Bustard, PT, DPT, OCS, ATC 05/29/24  9:23 AM

## 2024-05-29 ENCOUNTER — Ambulatory Visit (INDEPENDENT_AMBULATORY_CARE_PROVIDER_SITE_OTHER)

## 2024-05-29 ENCOUNTER — Encounter: Payer: Self-pay | Admitting: Rehabilitative and Restorative Service Providers"

## 2024-05-29 ENCOUNTER — Ambulatory Visit (HOSPITAL_COMMUNITY)
Admission: RE | Admit: 2024-05-29 | Discharge: 2024-05-29 | Disposition: A | Discharge status: Home / Self Care | Source: Ambulatory Visit | Attending: Sports Medicine | Admitting: Sports Medicine

## 2024-05-29 ENCOUNTER — Ambulatory Visit (INDEPENDENT_AMBULATORY_CARE_PROVIDER_SITE_OTHER): Admitting: Rehabilitative and Restorative Service Providers"

## 2024-05-29 ENCOUNTER — Encounter (HOSPITAL_COMMUNITY): Payer: Self-pay

## 2024-05-29 VITALS — BP 161/95 | HR 79 | Temp 98.5°F | Resp 18 | Ht 61.0 in | Wt 143.0 lb

## 2024-05-29 DIAGNOSIS — R600 Localized edema: Secondary | ICD-10-CM

## 2024-05-29 DIAGNOSIS — M25572 Pain in left ankle and joints of left foot: Secondary | ICD-10-CM

## 2024-05-29 DIAGNOSIS — M6281 Muscle weakness (generalized): Secondary | ICD-10-CM

## 2024-05-29 DIAGNOSIS — M7662 Achilles tendinitis, left leg: Secondary | ICD-10-CM

## 2024-05-29 DIAGNOSIS — M25511 Pain in right shoulder: Secondary | ICD-10-CM | POA: Diagnosis not present

## 2024-05-29 MED ORDER — MELOXICAM 7.5 MG PO TABS: 15.0000 mg | ORAL_TABLET | Freq: Every day | ORAL | 0 refills | Status: DC

## 2024-05-29 NOTE — ED Triage Notes (Signed)
"  severe pain in ankle and upper leg since Sunday 21 May 2024, hard to walk and apply pressure to ankle, pain is waking me up at night" - Entered by patient.  Patient presenting with left ankle pain shooting up the back of the leg onset 05/21/24. No known falls or injuries. Has not noticed any swelling.  Prescriptions or OTC medications tried: Yes- 1000 mg tylenol , Gabapentin  400 mg,    with no relief

## 2024-05-29 NOTE — ED Provider Notes (Signed)
 [facility]    CSN: [id number] Arrival date & time: 05/29/24  1027      History   Chief Complaint Chief Complaint  Patient presents with   Ankle Pain   Appointment    HPI Marlette L Senft is a 53 y.o. female here with 1 week of left ankle pain. Pain was insidious in onset - denies any injury. Locates pain primarily to the posterior ankle and up the calf, but also endorses pain in the anterior ankle joint. Having a lot of difficulty and pain with weightbearing. She denies any swelling or erythema of the ankle. Has been taking tylenol  1000mg  TID and gabapentin  without relief. History of left great toe surgery for hallux rigidus (?), denies other ankle or achilles injuries or surgeries.   Ankle Pain   Past Medical History:  Diagnosis Date   Anemia    Anxiety    Arthritis 02/23/2014   Rib Cage   Asthma    Depression    Diverticulosis    Dysmenorrhea 05/08/2018   Essential hypertension 07/29/2019   HLD (hyperlipidemia)    Panic attacks    Pre-diabetes    Suppurative hidradenitis     Patient Active Problem List   Diagnosis Date Noted   ASCUS with positive high risk HPV cervical 04/18/2024   Genitourinary syndrome of menopause 04/18/2024   Adhesive capsulitis of right shoulder 11/28/2023   B12 deficiency 11/28/2023   Neuropathy 06/22/2023   Inguinal hernia of left side without obstruction or gangrene 04/30/2020   Hyperlipidemia 07/30/2019   Syncope 07/29/2019   Essential hypertension 07/29/2019   Dysmenorrhea 05/08/2018   Herpes 03/21/2014   Suppurative hidradenitis    Depression     Past Surgical History:  Procedure Laterality Date   CHOLECYSTECTOMY N/A 06/28/2023   Procedure: LAPAROSCOPIC CHOLECYSTECTOMY;  Surgeon: Oza Blumenthal, MD;  Location: [facility] OR;  Service: General;  Laterality: N/A;  TAP BLOCK   INGUINAL HERNIA REPAIR Left 06/28/2023   Procedure: OPEN LEFT INGUINAL HERNIA REPAIR WITH MESH;  Surgeon: Oza Blumenthal, MD;  Location: [facility] OR;   Service: General;  Laterality: Left;   oral surgery     PELVIC LAPAROSCOPY  1998/1999 `   pelvic adhesions and pain   TOE SURGERY Left    great toe   WISDOM TOOTH EXTRACTION      OB History     Gravida  2   Para  1   Term      Preterm  1   AB  1   Living  0      SAB  1   IAB      Ectopic      Multiple      Live Births               Home Medications    Prior to Admission medications   Medication Sig Start Date End Date Taking? Authorizing Provider  albuterol  (VENTOLIN  HFA) 108 (90 Base) MCG/ACT inhaler Inhale 2 puffs into the lungs every 6 (six) hours as needed for wheezing or shortness of breath. 03/08/23  Yes Lawrance Presume, MD  amLODipine  (NORVASC ) 10 MG tablet Take 1 tablet (10 mg total) by mouth daily. 11/10/23  Yes Lawrance Presume, MD  atorvastatin  (LIPITOR) 10 MG tablet TAKE 1 TABLET BY MOUTH EVERY DAY 01/04/24  Yes Lawrance Presume, MD  desonide (DESOWEN) 0.05 % ointment Apply 1 Application topically daily as needed (rash). 03/23/23  Yes [provider]  estradiol  (ESTRACE  VAGINAL) 0.1 MG/GM  vaginal cream Apply 1/2 gram to vulva nightly for 2 weeks then decrease to 1/2 gram to vulva two nights a week. Do not use applicator. 04/18/24  Yes Hines, Genesis V, MD  fluticasone  (FLONASE ) 50 MCG/ACT nasal spray Place 1 spray into both nostrils daily. 04/25/24  Yes Collins Dean, NP  gabapentin  (NEURONTIN ) 100 MG capsule Take 100 mg by mouth as needed.   Yes [provider]  losartan  (COZAAR ) 25 MG tablet Take 1 tablet (25 mg total) by mouth daily. 07/10/23  Yes Lawrance Presume, MD  meloxicam  (MOBIC ) 7.5 MG tablet Take 2 tablets (15 mg total) by mouth daily. 05/29/24  Yes Adel Burch D, MD  metroNIDAZOLE  (METROGEL ) 0.75 % vaginal gel Place 1 Applicatorful vaginally 2 (two) times a week for 52 doses. 6 months of treatment. 04/18/24 10/15/24  Romaine Closs, MD    Family History Family History  Problem Relation Age of Onset    Diabetes Mother    Hypertension Mother    Hyperlipidemia Mother    Heart murmur Mother    Crohn's disease Mother    Hypertension Father    Stroke Father    Hyperlipidemia Father    Diabetes Sister    Hypertension Sister    Hyperlipidemia Sister    Hypertension Maternal Grandmother    Diabetes Maternal Grandmother    Osteoporosis Maternal Grandmother    Cirrhosis Maternal Grandfather    Thyroid disease Paternal Aunt    Breast cancer Neg Hx     Social History Social History   Tobacco Use   Smoking status: Former    Current packs/day: 0.00    Types: Cigarettes    Quit date: 1996    Years since quitting: 29.4   Smokeless tobacco: Never   Tobacco comments:    Weed  Vaping Use   Vaping status: Never Used  Substance Use Topics   Alcohol use: Yes    Comment: social   Drug use: Yes    Frequency: 3.0 times per week    Types: Marijuana     Allergies   Codeine    Review of Systems Review of Systems   Physical Exam Triage Vital Signs ED Triage Vitals  Encounter Vitals Group     BP 05/29/24 1051 (!) 161/95     Systolic BP Percentile --      Diastolic BP Percentile --      Pulse Rate 05/29/24 1051 79     Resp 05/29/24 1051 18     Temp 05/29/24 1051 98.5 F (36.9 C)     Temp Source 05/29/24 1051 Oral     SpO2 05/29/24 1051 98 %     Weight 05/29/24 1051 143 lb (64.9 kg)     Height 05/29/24 1051 5\' 1"  (1.549 m)     Head Circumference --      Peak Flow --      Pain Score 05/29/24 1049 8     Pain Loc --      Pain Education --      Exclude from Growth Chart --    No data found.  Updated Vital Signs BP (!) 161/95 (BP Location: Left Arm) Comment: No BP meds today  Pulse 79   Temp 98.5 F (36.9 C) (Oral)   Resp 18   Ht 5\' 1"  (1.549 m)   Wt 64.9 kg   SpO2 98%   BMI 27.02 kg/m   Visual Acuity Right Eye Distance:   Left Eye Distance:   Bilateral Distance:  Right Eye Near:   Left Eye Near:    Bilateral Near:     Physical Exam Constitutional:       General: She is not in acute distress.    Appearance: Normal appearance.  Cardiovascular:     Rate and Rhythm: Normal rate.  Pulmonary:     Effort: Pulmonary effort is normal.  Musculoskeletal:     Comments: Left Foot and Ankle Exam: No visible erythema or swelling. Significant pes planus deformity. She does have a remote scar on the dorsum of the 1st MTPJ. ROM of the ankle preserved, but pain with passive dorsiflexion and plantar flexion at extremes of motion.  Strength is 5/5 in all directions TTP at insertion/body/myotendinous junction of the Achilles tendon and diffusely up the calf. She is also tender across the talar dome.  Nontender peroneal tendons, medial and lateral malleoli, plantar calcaneal, navicular, base of 5th MT, cuboid and cuneiforms, distal Mts pain and antalgic gait with WBing Neg anterior drawer Neg talar tilt Neg Tib-Fib Squeeze Test Negative Thompsons   Neurological:     Mental Status: She is alert.      UC Treatments / Results  Labs (all labs ordered are listed, but only abnormal results are displayed) Labs Reviewed - No data to display  EKG   Radiology DG Ankle Complete Left Result Date: 05/29/2024 CLINICAL DATA:  Left ankle pain EXAM: LEFT ANKLE COMPLETE - 3+ VIEW COMPARISON:  None Available. FINDINGS: There is no evidence of fracture, dislocation, or joint effusion. There is no evidence of arthropathy or other focal bone abnormality. Soft tissues are unremarkable. IMPRESSION: Negative. Electronically Signed   By: Fredrich Jefferson M.D.   On: 05/29/2024 11:48    Procedures Procedures (including critical care time)  Medications Ordered in UC Medications - No data to display  Initial Impression / Assessment and Plan / UC Course  I have reviewed the triage vital signs and the nursing notes.  Pertinent labs & imaging results that were available during my care of the patient were reviewed by me and considered in my medical decision making (see chart for  details).    Acute left ankle pain - Plan: DG Ankle Complete Left, DG Ankle Complete Left  Achilles tendinitis of left lower extremity Vitals reviewed with the patient and significant for uncontrolled hypertension, otherwise hemodynamically stable. Exam consistent with acute achilles tendinitis without concern for rupture. X-rays personally reviewed and show no acute or significant degenerative changes. Given her pain and limitation with weightbearing I did recommend 5-7 days in a short walking boot, provided today. I also reviewed heel cups for offloading her achilles and recommended purchasing these. 2 weeks of Mobic  15mg  every day sent, then PRN use. Continue tylenol  and gabapentin .  Recommended sports medicine follow-up and she is already established with Dr. Alease Hunter. She will call his office to schedule follow-up in 1-2 weeks.  Patient's questions were answered and they are in agreement with this plan  Final Clinical Impressions(s) / UC Diagnoses   Final diagnoses:  Acute left ankle pain  Achilles tendinitis of left lower extremity   Discharge Instructions      You were seen today for left ankle pain.  Your exam is most consistent with Achilles tendinitis.  Your x-rays were negative for any fractures or significant arthritis in the ankle joint.  For management: Recommend using the boot for the next 5 to 7 days as needed for comfort. He can also purchase heel cups online that can help provide a little  heel lift and padding underneath the heel to use on the left leg. I have sent you a medication called meloxicam  to take once daily over the next 2 to 3 weeks then as needed Continue the Tylenol  1000 mg every 8 hours as well as your gabapentin   Call and schedule a follow-up with Dr. Anson Kinnier in the next 1 to 2 weeks  ED Prescriptions     Medication Sig Dispense Auth. Provider   meloxicam  (MOBIC ) 7.5 MG tablet Take 2 tablets (15 mg total) by mouth daily. 60 tablet Javeria Briski D, MD       PDMP not reviewed this encounter.   Marliss Simple, MD 05/29/24 1226

## 2024-05-29 NOTE — Discharge Instructions (Addendum)
 You were seen today for left ankle pain.  Your exam is most consistent with Achilles tendinitis.  Your x-rays were negative for any fractures or significant arthritis in the ankle joint.  For management: Recommend using the boot for the next 5 to 7 days as needed for comfort. He can also purchase heel cups online that can help provide a little heel lift and padding underneath the heel to use on the left leg. I have sent you a medication called meloxicam  to take once daily over the next 2 to 3 weeks then as needed Continue the Tylenol  1000 mg every 8 hours as well as your gabapentin   Call and schedule a follow-up with Dr. Anson Kinnier in the next 1 to 2 weeks

## 2024-06-05 ENCOUNTER — Other Ambulatory Visit: Payer: Self-pay

## 2024-06-05 ENCOUNTER — Ambulatory Visit (INDEPENDENT_AMBULATORY_CARE_PROVIDER_SITE_OTHER): Admitting: Rehabilitative and Restorative Service Providers"

## 2024-06-05 ENCOUNTER — Ambulatory Visit: Admitting: Family Medicine

## 2024-06-05 ENCOUNTER — Encounter: Payer: Self-pay | Admitting: Rehabilitative and Restorative Service Providers"

## 2024-06-05 VITALS — BP 158/96 | HR 72 | Ht 61.0 in | Wt 146.0 lb

## 2024-06-05 DIAGNOSIS — M25572 Pain in left ankle and joints of left foot: Secondary | ICD-10-CM | POA: Diagnosis not present

## 2024-06-05 DIAGNOSIS — R600 Localized edema: Secondary | ICD-10-CM

## 2024-06-05 DIAGNOSIS — G8929 Other chronic pain: Secondary | ICD-10-CM | POA: Diagnosis not present

## 2024-06-05 DIAGNOSIS — M25511 Pain in right shoulder: Secondary | ICD-10-CM | POA: Diagnosis not present

## 2024-06-05 DIAGNOSIS — M6281 Muscle weakness (generalized): Secondary | ICD-10-CM | POA: Diagnosis not present

## 2024-06-05 NOTE — Patient Instructions (Addendum)
 Thank you for coming in today.   You received an injection today. Seek immediate medical attention if the joint becomes red, extremely painful, or is oozing fluid.   We will see you at the June 25th visit.  Please schedule another follow-up visit for the end of July.

## 2024-06-05 NOTE — Therapy (Signed)
 OUTPATIENT PHYSICAL THERAPY TREATMENT  Patient Name: Misty Bailey MRN: [id number] DOB:[birth year], 53 y.o., female Today's Date: 06/05/2024   END OF SESSION:  PT End of Session - 06/05/24 0839     Visit Number 34    Number of Visits 35    Date for PT Re-Evaluation 06/12/24    Authorization Type [employer] $40 copay    Progress Note Due on Visit 35    PT Start Time 0839    PT Stop Time 0929    PT Time Calculation (min) 50 min    Activity Tolerance Patient limited by fatigue    Behavior During Therapy [facility] for tasks assessed/performed                     Past Medical History:  Diagnosis Date   Anemia    Anxiety    Arthritis 02/23/2014   Rib Cage   Asthma    Depression    Diverticulosis    Dysmenorrhea 05/08/2018   Essential hypertension 07/29/2019   HLD (hyperlipidemia)    Panic attacks    Pre-diabetes    Suppurative hidradenitis    Past Surgical History:  Procedure Laterality Date   CHOLECYSTECTOMY N/A 06/28/2023   Procedure: LAPAROSCOPIC CHOLECYSTECTOMY;  Surgeon: Oza Blumenthal, MD;  Location: [facility] OR;  Service: General;  Laterality: N/A;  TAP BLOCK   INGUINAL HERNIA REPAIR Left 06/28/2023   Procedure: OPEN LEFT INGUINAL HERNIA REPAIR WITH MESH;  Surgeon: Oza Blumenthal, MD;  Location: [facility] OR;  Service: General;  Laterality: Left;   oral surgery     PELVIC LAPAROSCOPY  1998/1999 `   pelvic adhesions and pain   TOE SURGERY Left    great toe   WISDOM TOOTH EXTRACTION     Patient Active Problem List   Diagnosis Date Noted   ASCUS with positive high risk HPV cervical 04/18/2024   Genitourinary syndrome of menopause 04/18/2024   Adhesive capsulitis of right shoulder 11/28/2023   B12 deficiency 11/28/2023   Neuropathy 06/22/2023   Inguinal hernia of left side without obstruction or gangrene 04/30/2020   Hyperlipidemia 07/30/2019   Syncope 07/29/2019   Essential hypertension 07/29/2019   Dysmenorrhea 05/08/2018   Herpes 03/21/2014   Suppurative  hidradenitis    Depression     PCP: Lawrance Presume, MD   REFERRING PROVIDER: Arnie Lao*   REFERRING DIAG:  Diagnosis  M24.611 (ICD-10-CM) - Arthrofibrosis of right shoulder  Z98.890 (ICD-10-CM) - Status post arthroscopy of right shoulder    THERAPY DIAG:  Acute pain of right shoulder  Muscle weakness (generalized)  Localized edema  Rationale for Evaluation and Treatment: Rehabilitation  ONSET DATE: February 2024  SUBJECTIVE:  SUBJECTIVE STATEMENT: Pt indicated having some increased tightness Rt shoulder today but not necessarily painful.  Pt. Indicated having complaints in Lt ankle and saw Urgent Care on last Wednesday and had the boot  on and will see Dr. Alease Hunter today.   PERTINENT HISTORY: Pt s/p on 10/12/23 manipulation c debridement of anterior capsule,  inferior capsule and subacrominal space  PMH: anemia, anxiety, asthma, depression, diverticulosis, essential HTN, panic attacks, pre-diabetes, hernia repair, oral surgery, toe surgery, pelvic laparoscopy, cholecystectomy   PAIN:  NPRS scale: no specific pain number Pain location: Rt shoulder Pain description: tight Aggravating factors: lifting my arm, ADL's Relieving factors: resting, pain meds as needed  PRECAUTIONS: None  WEIGHT BEARING RESTRICTIONS: No  FALLS:  Has patient fallen in last 6 months? Yes. Number of falls pt stating no injuries, pt stating she was dehydrated.   LIVING ENVIRONMENT: Lives with: lives with their family and lives alone Lives in: House/apartment Stairs: No Has following equipment at home: None  OCCUPATION: Out of work since May  PLOF: Independent  PATIENT GOALS: be able reach overhead   OBJECTIVE:   DIAGNOSTIC FINDINGS:  10/31/2023 review of chart:  IMPRESSION: 1. Mild  tendinosis of the supraspinatus tendon anteriorly. 2. Mild subacromial/subdeltoid bursitis. 3. Mild relative thickening of the inferior joint capsule as can be seen with adhesive capsulitis.  PATIENT SURVEYS:  FOTO discontinued by clinic.  01/18/2024:  FOTO update 62  12/18/2023: FOTO update 46%  11/21/23: FOTO update 48%  10/31/23: FOTO intake:  41%      COGNITION: 10/31/2023 Overall cognitive status: WFL     SENSATION: 10/31/2023 Tingling in Rt fingers, pt feels that may be due to B-12 deficiency  POSTURE 10/31/2023 Forward head and rounded shoulders  UPPER EXTREMITY ROM:   ROM (* indicated pain) Right 10/31/23 Left eval Right  PROM 11/07/23 Rt 11/14/23 PROM supine Rt 11/21/23 PROM supine Rt 11/29/2023 AROM Rt 12/05/23 Rt 12/12/23 Rt 12/26/23 supine Rt 01/02/24 supine Rt 01/09/24 supine Right 02/26/2024 Right 03/19/24 Right 04/17/2024 AROM in supine Right 05/08/2024 AROM in supine Right 05/29/2024  Shoulder flexion 60 155 90* 110* 108 110 AA: 120 A: 124 A: 128 A: 125 A: 130 AROM in supine 130   AROM in standing against gravity: 125 deg AA: supine 140  143 AROM 150 AROM 160 AROM in standing  Shoulder extension 15 40                Shoulder abduction 65 162 80* 100*    A: 100 A: 110 A: 112 A: 115 AROM in supine 125 AROM Supine 125 143 AROM     Shoulder adduction                  Shoulder internal rotation 50 75 60* 60 *          70 AROM in supine in 45 deg abduction: 80 AROM in supine in 45 deg abduction:   Shoulder external rotation 25 75 15-20* very guarded and pain limited  30 * 30   A: 50 A: 55 A: 54 A; 55 AROM in supine in 45 deg abduction: 65  70 AROM in supine in 45 deg abduction: 80 AROM in supine in 45 deg abduction:   (Blank rows = not tested)  UPPER EXTREMITY MMT:  MMT Right 10/31/2023 Left 10/31/2023 Rt 12/18/23 Rt 01/09/24 Right 01/18/2024 Right 03/04/2024 Right 04/04/2024  Left 04/04/2024 Right 04/17/2024 Right 06/05/2024  Shoulder  flexion 2 5 3  3+ 4/5 4+/5 c pain 5/5 5/5  5/5 5/5  Shoulder extension 3 5 3+ 3+        Shoulder abduction 2 5 3  3+ 3+/5 4/5 c pain 4+/5 5/5 4+/5   Shoulder adduction            Shoulder internal rotation 2 5 3   5/5 5/5 5/5 5/5 5/5   Shoulder external rotation 2 5 3   4/5 4+/5 4/5 5/5 5/5   (Blank rows = not tested)   PALPATION:  05/01/2024:  Continued trigger points noted in Rt upper trap, infraspinatus but reducing in number.   04/11/2024:  Trigger points, tightness posterior capsule, infraspinatus.  10/31/2023 TTP: Rt upper trap, Rt bicep tendons. Rt deltoid, Rt supraspinatus.                                                                                                                                                                                                  TODAY'S TREATMENT:                                                                                            DATE: 06/05/2024:  Therex: UBE fwd/back 4 mins each way lvl 3.5 with 1 min rest between directions Cross arm stretch Rt 15 sec x 5 at times throughout visit.  Neuro Re-ed (postural awareness/activation) Bilateral blue band rows c scapular retraction 2 x 15 Bilateral blue band gh ext 2 x 15   TherActivity Rt arm D2 extension red band x 10  UE ranger flexion  2-3 sec hold x 15 each with Rt arm 1.5 lb  Standing shelf reaching 0 lb cone, 1 lb and 1.5 lb weight to shoulder and overhead 3 mins   Manual Percussive device to Rt infraspinatus for soft tissue mobilization.   Vasopneumatic Device Rt shoulder low compression 34 degrees 10 mins in sitting  TODAY'S TREATMENT:  DATE: 05/28/2024:  Therex: UBE fwd/back 4 mins each way lvl 3.5 with 1 min rest between directions Wall slide flexion c lift off 2 sec hold x 10 bilaterally performed  Cross arm stretch Rt 15 sec x 5 at times throughout visit. Wall push up with SA press hold 2-3  sec, x 15 reps   TherActivity UE ranger flexion, scaption 2-3 sec hold x 15 each with Rt arm Standing full range flexion elevation Rt arm 1 lb weight to fatigue x9,  Standing flexion to 90 deg to abduction to side and reverse x 10 bilaterally    Vasopneumatic Device Rt shoulder low compression 34 degrees 10 mins in sitting   TODAY'S TREATMENT:                                                                                            DATE: 05/22/2024:  Therex:  Doorway bil ER stretch 30 sec x 3 Tband green band ER ROM c towel under arm 2 x 10 c slow movement focus  Cross arm posterior stretch 30 sec x 3  UBE: level 3 x 3 minutes each direction  TherActivity (to improve functional reach, liff Seated pulley flexion, scaption x 3 mins c 5 sec end range stretch and eccentric lowering  Neuro Re-ed (postural awareness/muscular recruitment, control) Rt shoulder green band walk outs ER c towel under arm 5 sec hold x 10 Wall walk ups c red TB around bil UE's  x 5   Vasopneumatic Device Rt shoulder low compression 34 degrees 10 mins in sitting   TODAY'S TREATMENT:                                                                                            DATE: 05/15/2024:  Therex: Doorway Rt ER stretch 30 sec x 3 Tband green band ER ROM c towel under arm 2 x 10 c slow movement focus  Cross arm posterior stretch 30 sec x 3   TherActivity (to improve functional reach, liff Seated pulley flexion, scaption 3 mins 5 sec stretch at top with eccentric lowering control focus.  Rest break and technique review performed  Neuro Re-ed (postural awareness/muscular recruitment, control) Lat pull down for scapular mobility control blue band 2 x 15 seated.  Rt shoulder green band walk outs ER c towel under arm 5 sec hold x 10  Vasopneumatic Device Rt shoulder low compression 34 degrees 10 mins in sitting  PATIENT EDUCATION: 01/18/2024 Education details: HEP update Person educated:  Patient Education method: Programmer, multimedia, Demonstration, Verbal cues, and Handouts Education comprehension: verbalized understanding, returned demonstration, and verbal cues required  HOME EXERCISE PROGRAM: Access Code: [email or web address] URL: [email or web address] Date: 01/18/2024 Prepared by: Bonna Bustard  Exercises - Supine Shoulder External Rotation in 45 Degrees Abduction AAROM  with Dowel (Mirrored)  - 2-3 x daily - 7 x weekly - 2 sets - 10 reps - 2 seconds hold - Standing Shoulder Posterior Capsule Stretch (Mirrored)  - 2-3 x daily - 7 x weekly - 1 sets - 3 reps - 15-30 hold - Sleeper Stretch (Mirrored)  - 2-3 x daily - 7 x weekly - 1 sets - 3 reps - 30 hold - Standing Shoulder Row with Anchored Resistance  - 1 x daily - 7 x weekly - 1-2 sets - 10-15 reps - Shoulder Extension with Resistance  - 1 x daily - 7 x weekly - 1-2 sets - 10-15 reps - Shoulder External Rotation Reactive Isometrics (Mirrored)  - 1-2 x daily - 7 x weekly - 1 sets - 10 reps - 5-15 hold - Sidelying Shoulder Abduction Palm Forward  - 1-2 x daily - 7 x weekly - 2-3 sets - 10-15 reps - Sidelying Shoulder Flexion 15 Degrees (Mirrored)  - 1-2 x daily - 7 x weekly - 2-3 sets - 10-15 reps  ASSESSMENT:  CLINICAL IMPRESSION: Fatigue still present but slowly progression noted in functional strengthening activity. Discussed percussive device purchase for home use (provided recommendation of type).  Has one more scheduled visit at this time.  Continued focus on HEP transitioning indicated.    OBJECTIVE IMPAIRMENTS: decreased ROM, decreased strength, increased edema, impaired UE functional use, postural dysfunction, and pain.   ACTIVITY LIMITATIONS: carrying, sleeping, bathing, toileting, dressing, reach over head, and hygiene/grooming  PARTICIPATION LIMITATIONS: meal prep, cleaning, laundry, driving, shopping, community activity, and occupation  PERSONAL FACTORS: 3+ comorbidities: see pertinent history are also  affecting patient's functional outcome.   REHAB POTENTIAL: Good  CLINICAL DECISION MAKING: Stable/uncomplicated  EVALUATION COMPLEXITY: Low   GOALS: Goals reviewed with patient? Yes  SHORT TERM GOALS: (target date for Short term goals are 3 weeks 11/24/23)  1.Patient will demonstrate independent use of home exercise program to maintain progress from in clinic treatments. Goal status: MET 11/14/23  LONG TERM GOALS: (target dates for all long term goals are 6 additional weeks  06/12/2024 )   1. Patient will demonstrate/report pain at worst less than or equal to 2/10 to facilitate minimal limitation in daily activity secondary to pain symptoms. Goal status: on going 05/08/2024   2. Patient will demonstrate independent use of home exercise program to facilitate ability to maintain/progress functional gains from skilled physical therapy services. Goal status  on going 05/08/2024   3. Patient will demonstrate FOTO outcome > or = 61 % to indicate reduced disability due to condition. Goal status:met 01/18/2024   4.  Patient will demonstrate Rt UE MMT >/= 4/5 throughout to facilitate lifting, reaching, carrying at [facility] in daily activity.   Goal status:  on going 05/08/2024   5.  Patient will demonstrate Rt GH joint AROM WFL s symptoms to facilitate usual overhead reaching, self care, dressing at PLOF.    Goal status:  on going 05/08/2024   6.  Pt will improve her Rt shoulder flexion to >/= 140 degrees for improvements in ADL's.  Goal status: Met  05/08/2024  7. Pt will improve her Rt shoulder ER to >/= 60 degrees in order to improved functional actives.   Goal Status: Met 04/17/2024     PLAN:  PT FREQUENCY: 1x/week  PT DURATION: 8 weeks  PLANNED INTERVENTIONS: Can include 16109- PT Re-evaluation, 97110-Therapeutic exercises, 97530- Therapeutic activity, 97112- Neuromuscular re-education, 97535- Self Care, 97140- Manual therapy, [id number]- Gait training, [phone]- Orthotic Fit/training,  [phone]-  Canalith repositioning, 40981- Aquatic Therapy, [id number]- Electrical stimulation (unattended), [id number]- Electrical stimulation (manual), [id number]- Vasopneumatic device, [id number]- Ultrasound, [id number]- Traction (mechanical), [id number]- Ionotophoresis 4mg /ml Dexamethasone , Patient/Family education, Balance training, Stair training, Taping, Dry Needling, Joint mobilization, Joint manipulation, Spinal manipulation, Spinal mobilization, Scar mobilization, Vestibular training, Visual/preceptual remediation/compensation, DME instructions, Cryotherapy, and Moist heat.  All performed as medically necessary.  All included unless contraindicated  PLAN FOR NEXT SESSION: One more visit planned prior to possible HEP trial.    Bonna Bustard, PT, DPT, OCS, ATC 06/05/24  9:22 AM

## 2024-06-05 NOTE — Progress Notes (Signed)
 [name], PhD, LAT, ATC acting as a scribe for Garlan Juniper, MD.  Misty Bailey is a 53 y.o. female who presents to [facility] today for L ankle pain. Pt was previously seen by Dr. Alease Hunter on 04/15/24 for L shoulder pain.  Today, pt c/o L ankle pain x 2-wks. No injury, insidious onset. She had been doing a lot of walking the day prior to the pain starting. Pt was seen at [facility] on 6/4. Pt locates pain to the talocrural joint, anteriorly. Pain is waking her up at night.  L ankle swelling: slight Aggravates: walking Treatments tried: CAM walker boot, IBU,   Dx imaging: 05/29/24 L ankle XR  Pertinent review of systems: No fevers or chills  Relevant historical information: Hypertension Adhesive capsulitis right shoulder.  Exam:  BP (!) 158/96   Pulse 72   Ht 5' 1 (1.549 m)   Wt 146 lb (66.2 kg)   SpO2 98%   BMI 27.59 kg/m  General: Well Developed, well nourished, and in no acute distress.   MSK: Left ankle mild effusion normal-appearing otherwise.  Normal motion pain with dorsiflexion.  Tender palpation anterior ankle. Intact strength pain with resisted foot dorsiflexion. Stable ligamentous exam.    Lab and Radiology Results  Procedure: Real-time Ultrasound Guided Injection of left ankle lateral approach Device: Philips Affiniti 50G/GE Logiq Images permanently stored and available for review in PACS Ultrasound evaluation prior to injection reveals no significant tenosynovitis changes.  Minimal joint effusion is present. Verbal informed consent obtained.  Discussed risks and benefits of procedure. Warned about infection, bleeding, hyperglycemia damage to structures among others. Patient expresses understanding and agreement Time-out conducted.   Noted no overlying erythema, induration, or other signs of local infection.   Skin prepped in a sterile fashion.   Local anesthesia: Topical Ethyl chloride.   With sterile technique and under  real time ultrasound guidance: 40 mg of Kenalog  and 1 mL of Marcaine  injected into ankle joint. Fluid seen entering the joint capsule.   Completed without difficulty   Pain immediately resolved suggesting accurate placement of the medication.   Advised to call if fevers/chills, erythema, induration, drainage, or persistent bleeding.   Images permanently stored and available for review in the ultrasound unit.  Impression: Technically successful ultrasound guided injection.  EXAM: LEFT ANKLE COMPLETE - 3+ VIEW   COMPARISON:  None Available.   FINDINGS: There is no evidence of fracture, dislocation, or joint effusion. There is no evidence of arthropathy or other focal bone abnormality. Soft tissues are unremarkable.   IMPRESSION: Negative.     Electronically Signed   By: Fredrich Jefferson M.D.   On: 05/29/2024 11:48 I, Garlan Juniper, personally (independently) visualized and performed the interpretation of the images attached in this note.  Lab Results  Component Value Date   LABURIC 3.8 05/15/2023       Assessment and Plan: 53 y.o. female with left ankle pain.  Patient has severe left ankle pain without a good injury cause.  She did increase her activity level just prior to the onset of pain.  She does have a history of right shoulder adhesive capsulitis.  She may have a similar process in the left ankle.  X-ray and ultrasound are largely normal.  Plan for trial of intra-articular ankle injection and recheck in about 2 weeks.  If not improving consider MRI.   PDMP not reviewed this encounter. Orders Placed This Encounter  Procedures   US  LIMITED JOINT  SPACE STRUCTURES LOW LEFT(NO LINKED CHARGES)    Reason for Exam (SYMPTOM  OR DIAGNOSIS REQUIRED):   left ankle pain    Preferred imaging location?:   [facility]   No orders of the defined types were placed in this encounter.    Discussed warning signs or symptoms. Please see discharge instructions.  Patient expresses understanding.   The above documentation has been reviewed and is accurate and complete Garlan Juniper, M.D.

## 2024-06-12 ENCOUNTER — Ambulatory Visit (INDEPENDENT_AMBULATORY_CARE_PROVIDER_SITE_OTHER): Admitting: Rehabilitative and Restorative Service Providers"

## 2024-06-12 ENCOUNTER — Encounter: Payer: Self-pay | Admitting: Rehabilitative and Restorative Service Providers"

## 2024-06-12 DIAGNOSIS — M6281 Muscle weakness (generalized): Secondary | ICD-10-CM | POA: Diagnosis not present

## 2024-06-12 DIAGNOSIS — M25511 Pain in right shoulder: Secondary | ICD-10-CM

## 2024-06-12 DIAGNOSIS — R600 Localized edema: Secondary | ICD-10-CM

## 2024-06-12 NOTE — Therapy (Addendum)
 OUTPATIENT PHYSICAL THERAPY TREATMENT/ DISCHARGE  Patient Name: Misty Bailey MRN: [id number] DOB:1971/08/11, 53 y.o., female Today's Date: 06/12/2024   END OF SESSION:  PT End of Session - 06/12/24 0851     Visit Number 35    Number of Visits 35    Date for PT Re-Evaluation 06/12/24    Authorization Type [employer] $40 copay    Progress Note Due on Visit 35    PT Start Time 0845    PT Stop Time 0924    PT Time Calculation (min) 39 min    Activity Tolerance Patient tolerated treatment well    Behavior During Therapy [facility] for tasks assessed/performed           Past Medical History:  Diagnosis Date   Anemia    Anxiety    Arthritis 02/23/2014   Rib Cage   Asthma    Depression    Diverticulosis    Dysmenorrhea 05/08/2018   Essential hypertension 07/29/2019   HLD (hyperlipidemia)    Panic attacks    Pre-diabetes    Suppurative hidradenitis    Past Surgical History:  Procedure Laterality Date   CHOLECYSTECTOMY N/A 06/28/2023   Procedure: LAPAROSCOPIC CHOLECYSTECTOMY;  Surgeon: Vernetta Berg, MD;  Location: [facility] OR;  Service: General;  Laterality: N/A;  TAP BLOCK   INGUINAL HERNIA REPAIR Left 06/28/2023   Procedure: OPEN LEFT INGUINAL HERNIA REPAIR WITH MESH;  Surgeon: Vernetta Berg, MD;  Location: [facility] OR;  Service: General;  Laterality: Left;   oral surgery     PELVIC LAPAROSCOPY  1998/1999 `   pelvic adhesions and pain   TOE SURGERY Left    great toe   WISDOM TOOTH EXTRACTION     Patient Active Problem List   Diagnosis Date Noted   ASCUS with positive high risk HPV cervical 04/18/2024   Genitourinary syndrome of menopause 04/18/2024   Adhesive capsulitis of right shoulder 11/28/2023   B12 deficiency 11/28/2023   Neuropathy 06/22/2023   Inguinal hernia of left side without obstruction or gangrene 04/30/2020   Hyperlipidemia 07/30/2019   Syncope 07/29/2019   Essential hypertension 07/29/2019   Dysmenorrhea 05/08/2018   Herpes 03/21/2014   Suppurative  hidradenitis    Depression     PCP: Vicci Barnie NOVAK, MD   REFERRING PROVIDER: Vernetta Lonni GRADE*   REFERRING DIAG:  Diagnosis  M24.611 (ICD-10-CM) - Arthrofibrosis of right shoulder  Z98.890 (ICD-10-CM) - Status post arthroscopy of right shoulder    THERAPY DIAG:  Acute pain of right shoulder  Muscle weakness (generalized)  Localized edema  Rationale for Evaluation and Treatment: Rehabilitation  ONSET DATE: February 2024  SUBJECTIVE:  SUBJECTIVE STATEMENT: Pt indicated no specific worsened symptoms in time since last visit.   PERTINENT HISTORY: Pt s/p on 10/12/23 manipulation c debridement of anterior capsule,  inferior capsule and subacrominal space  PMH: anemia, anxiety, asthma, depression, diverticulosis, essential HTN, panic attacks, pre-diabetes, hernia repair, oral surgery, toe surgery, pelvic laparoscopy, cholecystectomy   PAIN:  NPRS scale: no specific pain number Pain location: Rt shoulder Pain description: tight Aggravating factors: lifting my arm, ADL's Relieving factors: resting, pain meds as needed  PRECAUTIONS: None  WEIGHT BEARING RESTRICTIONS: No  FALLS:  Has patient fallen in last 6 months? Yes. Number of falls pt stating no injuries, pt stating she was dehydrated.   LIVING ENVIRONMENT: Lives with: lives with their family and lives alone Lives in: House/apartment Stairs: No Has following equipment at home: None  OCCUPATION: Out of work since May  PLOF: Independent  PATIENT GOALS: be able reach overhead   OBJECTIVE:   DIAGNOSTIC FINDINGS:  10/31/2023 review of chart:  IMPRESSION: 1. Mild tendinosis of the supraspinatus tendon anteriorly. 2. Mild subacromial/subdeltoid bursitis. 3. Mild relative thickening of the inferior joint capsule as can  be seen with adhesive capsulitis.  PATIENT SURVEYS:  FOTO discontinued by clinic.  01/18/2024:  FOTO update 62  12/18/2023: FOTO update 46%  11/21/23: FOTO update 48%  10/31/23: FOTO intake:  41%      COGNITION: 10/31/2023 Overall cognitive status: WFL     SENSATION: 10/31/2023 Tingling in Rt fingers, pt feels that may be due to B-12 deficiency  POSTURE 10/31/2023 Forward head and rounded shoulders  UPPER EXTREMITY ROM:   ROM (* indicated pain) Right 10/31/23 Left eval Right  PROM 11/07/23 Rt 11/14/23 PROM supine Rt 11/21/23 PROM supine Rt 11/29/2023 AROM Rt 12/05/23 Rt 12/12/23 Rt 12/26/23 supine Rt 01/02/24 supine Rt 01/09/24 supine Right 02/26/2024 Right 03/19/24 Right 04/17/2024 AROM in supine Right 05/08/2024 AROM in supine Right 05/29/2024  Shoulder flexion 60 155 90* 110* 108 110 AA: 120 A: 124 A: 128 A: 125 A: 130 AROM in supine 130   AROM in standing against gravity: 125 deg AA: supine 140  143 AROM 150 AROM 160 AROM in standing  Shoulder extension 15 40                Shoulder abduction 65 162 80* 100*    A: 100 A: 110 A: 112 A: 115 AROM in supine 125 AROM Supine 125 143 AROM     Shoulder adduction                  Shoulder internal rotation 50 75 60* 60 *          70 AROM in supine in 45 deg abduction: 80 AROM in supine in 45 deg abduction:   Shoulder external rotation 25 75 15-20* very guarded and pain limited  30 * 30   A: 50 A: 55 A: 54 A; 55 AROM in supine in 45 deg abduction: 65  70 AROM in supine in 45 deg abduction: 80 AROM in supine in 45 deg abduction:   (Blank rows = not tested)  UPPER EXTREMITY MMT:  MMT Right 10/31/2023 Left 10/31/2023 Rt 12/18/23 Rt 01/09/24 Right 01/18/2024 Right 03/04/2024 Right 04/04/2024  Left 04/04/2024 Right 04/17/2024 Right 06/05/2024  Shoulder flexion 2 5 3  3+ 4/5 4+/5 c pain 5/5 5/5 5/5 5/5  Shoulder extension 3 5 3+ 3+        Shoulder abduction 2 5 3  3+ 3+/5 4/5 c pain 4+/5 5/5 4+/5  Shoulder  adduction            Shoulder internal rotation 2 5 3   5/5 5/5 5/5 5/5 5/5   Shoulder external rotation 2 5 3   4/5 4+/5 4/5 5/5 5/5   (Blank rows = not tested)   PALPATION:  05/01/2024:  Continued trigger points noted in Rt upper trap, infraspinatus but reducing in number.   04/11/2024:  Trigger points, tightness posterior capsule, infraspinatus.  10/31/2023 TTP: Rt upper trap, Rt bicep tendons. Rt deltoid, Rt supraspinatus.                                                                                                                                                                                                  TODAY'S TREATMENT:                                                                                            DATE: 06/12/2024:  Therex: Pulley flexion, scapation 3 mins each way with outstretched arm and focus on eccentric lowering phase.  Sidelying Rt shoulder ER c towel under arm 1 lb 2 x 15 Sidelying Rt shoulder abduction 1 lb 2 x 15   Review of exercise routine in HEP with cues for daily stretching routine with variable strengthening exercise with some variation day to today pending soreness, etc.   Neuro Re-ed (postural awareness/activation) Bilateral blue band rows c scapular retraction 2 x 15 Bilateral blue band gh ext 2 x 15    TODAY'S TREATMENT:                                                                                            DATE: 06/05/2024:  Therex: UBE fwd/back 4 mins each way lvl 3.5 with 1 min rest between directions Cross arm stretch Rt 15 sec x 5 at times throughout visit.  Neuro Re-ed (postural awareness/activation)  Bilateral blue band rows c scapular retraction 2 x 15 Bilateral blue band gh ext 2 x 15   TherActivity Rt arm D2 extension red band x 10  UE ranger flexion  2-3 sec hold x 15 each with Rt arm 1.5 lb  Standing shelf reaching 0 lb cone, 1 lb and 1.5 lb weight to shoulder and overhead 3 mins   Manual Percussive device to Rt  infraspinatus for soft tissue mobilization.   Vasopneumatic Device Rt shoulder low compression 34 degrees 10 mins in sitting  TODAY'S TREATMENT:                                                                                            DATE: 05/28/2024:  Therex: UBE fwd/back 4 mins each way lvl 3.5 with 1 min rest between directions Wall slide flexion c lift off 2 sec hold x 10 bilaterally performed  Cross arm stretch Rt 15 sec x 5 at times throughout visit. Wall push up with SA press hold 2-3 sec, x 15 reps   TherActivity UE ranger flexion, scaption 2-3 sec hold x 15 each with Rt arm Standing full range flexion elevation Rt arm 1 lb weight to fatigue x9,  Standing flexion to 90 deg to abduction to side and reverse x 10 bilaterally    Vasopneumatic Device Rt shoulder low compression 34 degrees 10 mins in sitting   TODAY'S TREATMENT:                                                                                            DATE: 05/22/2024:  Therex:  Doorway bil ER stretch 30 sec x 3 Tband green band ER ROM c towel under arm 2 x 10 c slow movement focus  Cross arm posterior stretch 30 sec x 3  UBE: level 3 x 3 minutes each direction  TherActivity (to improve functional reach, liff Seated pulley flexion, scaption x 3 mins c 5 sec end range stretch and eccentric lowering  Neuro Re-ed (postural awareness/muscular recruitment, control) Rt shoulder green band walk outs ER c towel under arm 5 sec hold x 10 Wall walk ups c red TB around bil UE's  x 5   Vasopneumatic Device Rt shoulder low compression 34 degrees 10 mins in sitting  PATIENT EDUCATION: 06/12/2024 Education details: HEP update/review for trial HEP Person educated: Patient Education method: Programmer, multimedia, Facilities manager, Verbal cues, and Handouts Education comprehension: verbalized understanding, returned demonstration, and verbal cues required  HOME EXERCISE PROGRAM: Access Code: [email or web address] URL:  [email or web address] Date: 06/12/2024 Prepared by: Ozell Silvan  Exercises - Supine Shoulder External Rotation in 45 Degrees Abduction AAROM with Dowel (Mirrored)  - 2-3 x daily - 7 x weekly - 2 sets - 10 reps - 2 seconds hold - Standing shoulder flexion  wall slides  - 2-3 x daily - 7 x weekly - 1 sets - 10 reps - 5 hold - Standing Shoulder Posterior Capsule Stretch (Mirrored)  - 2-3 x daily - 7 x weekly - 1 sets - 3 reps - 15-30 hold - Sleeper Stretch (Mirrored)  - 2-3 x daily - 7 x weekly - 1 sets - 3 reps - 30 hold - Standing Shoulder Row with Anchored Resistance  - 1 x daily - 7 x weekly - 1-2 sets - 10-15 reps - Shoulder Extension with Resistance  - 1 x daily - 7 x weekly - 1-2 sets - 10-15 reps - Shoulder External Rotation Reactive Isometrics (Mirrored)  - 1 x daily - 7 x weekly - 1 sets - 10 reps - 5-15 hold - Sidelying Shoulder Abduction Palm Forward  - 1 x daily - 7 x weekly - 2-3 sets - 10-15 reps - Sidelying Shoulder Flexion 15 Degrees (Mirrored)  - 1 x daily - 7 x weekly - 2-3 sets - 10-15 reps - Sidelying Shoulder ER with Towel and Dumbbell  - 1 x daily - 7 x weekly - 2-3 sets - 10-15 reps  ASSESSMENT:  CLINICAL IMPRESSION: Pt to transition to trial of HEP for continued endurance/strength training and mobility gains.  Spent time in review of key HEP and answered questions about progression.  Pt was in agreement with plan at this time.  May return in future pending symptom /presentation change.   OBJECTIVE IMPAIRMENTS: decreased ROM, decreased strength, increased edema, impaired UE functional use, postural dysfunction, and pain.   ACTIVITY LIMITATIONS: carrying, sleeping, bathing, toileting, dressing, reach over head, and hygiene/grooming  PARTICIPATION LIMITATIONS: meal prep, cleaning, laundry, driving, shopping, community activity, and occupation  PERSONAL FACTORS: 3+ comorbidities: see pertinent history are also affecting patient's functional outcome.    REHAB POTENTIAL: Good  CLINICAL DECISION MAKING: Stable/uncomplicated  EVALUATION COMPLEXITY: Low   GOALS: Goals reviewed with patient? Yes  SHORT TERM GOALS: (target date for Short term goals are 3 weeks 11/24/23)  1.Patient will demonstrate independent use of home exercise program to maintain progress from in clinic treatments. Goal status: MET 11/14/23  LONG TERM GOALS: (target dates for all long term goals are 6 additional weeks  06/12/2024 )   1. Patient will demonstrate/report pain at worst less than or equal to 2/10 to facilitate minimal limitation in daily activity secondary to pain symptoms. Goal status: mostly met 06/12/2024   2. Patient will demonstrate independent use of home exercise program to facilitate ability to maintain/progress functional gains from skilled physical therapy services. Goal status  met 06/12/2024   3. Patient will demonstrate FOTO outcome > or = 61 % to indicate reduced disability due to condition. Goal status:met 01/18/2024   4.  Patient will demonstrate Rt UE MMT >/= 4/5 throughout to facilitate lifting, reaching, carrying at [facility] in daily activity.   Goal status: met 06/12/2024   5.  Patient will demonstrate Rt GH joint AROM WFL s symptoms to facilitate usual overhead reaching, self care, dressing at PLOF.    Goal status:  partially to mostly met 06/12/2024   6.  Pt will improve her Rt shoulder flexion to >/= 140 degrees for improvements in ADL's.  Goal status: Met  05/08/2024  7. Pt will improve her Rt shoulder ER to >/= 60 degrees in order to improved functional actives.   Goal Status: Met 04/17/2024     PLAN:  PT FREQUENCY: 1x/week  PT DURATION: 8 weeks  PLANNED INTERVENTIONS:  Can include 02853- PT Re-evaluation, 97110-Therapeutic exercises, 97530- Therapeutic activity, [id number]- Neuromuscular re-education, [phone]- Self Care, 97140- Manual therapy, [phone]- Gait training, [phone]- Orthotic Fit/training, [phone]- Canalith repositioning, [id number]-  Aquatic Therapy, 97014- Electrical stimulation (unattended), [id number]- Electrical stimulation (manual), [id number]- Vasopneumatic device, [id number]- Ultrasound, [id number]- Traction (mechanical), [id number]- Ionotophoresis 4mg /ml Dexamethasone , Patient/Family education, Balance training, Stair training, Taping, Dry Needling, Joint mobilization, Joint manipulation, Spinal manipulation, Spinal mobilization, Scar mobilization, Vestibular training, Visual/preceptual remediation/compensation, DME instructions, Cryotherapy, and Moist heat.  All performed as medically necessary.  All included unless contraindicated  PLAN FOR NEXT SESSION: Trial HEP, discharged after 30 days inactivity.    Ozell Silvan, PT, DPT, OCS, ATC 06/12/24  9:26 AM   PHYSICAL THERAPY DISCHARGE SUMMARY  Visits from Start of Care: 35  Current functional level related to goals / functional outcomes: See note   Remaining deficits: See note   Education / Equipment: HEP  Patient goals were mostly met. Patient is being discharged due to not returning since the last visit.  Ozell Silvan, PT, DPT, OCS, ATC 08/14/24  12:10 PM

## 2024-06-18 NOTE — Progress Notes (Unsigned)
   LILLETTE Ileana Collet, PhD, LAT, ATC acting as a scribe for Artist Lloyd, MD.  Yale CROME Beeney is a 53 y.o. female who presents to [facility] today for f/u L shoulder pain and to complete disability paperwork. Pt was last seen for her L shoulder on 04/15/24 and a new referral was placed to a different rheumatologist.  Today, pt reports ***  Pertinent review of systems: ***  Relevant historical information: ***   Exam:  There were no vitals taken for this visit. General: Well Developed, well nourished, and in no acute distress.   MSK: ***    Lab and Radiology Results No results found for this or any previous visit (from the past 72 hours). No results found.     Assessment and Plan: 53 y.o. female with ***   PDMP not reviewed this encounter. No orders of the defined types were placed in this encounter.  No orders of the defined types were placed in this encounter.    Discussed warning signs or symptoms. Please see discharge instructions. Patient expresses understanding.   ***

## 2024-06-19 ENCOUNTER — Ambulatory Visit (INDEPENDENT_AMBULATORY_CARE_PROVIDER_SITE_OTHER)

## 2024-06-19 ENCOUNTER — Ambulatory Visit: Admitting: Family Medicine

## 2024-06-19 ENCOUNTER — Encounter: Payer: Self-pay | Admitting: Family Medicine

## 2024-06-19 VITALS — BP 110/78 | HR 72 | Ht 61.0 in

## 2024-06-19 DIAGNOSIS — I739 Peripheral vascular disease, unspecified: Secondary | ICD-10-CM

## 2024-06-19 DIAGNOSIS — M5416 Radiculopathy, lumbar region: Secondary | ICD-10-CM | POA: Diagnosis not present

## 2024-06-19 MED ORDER — PREDNISONE 50 MG PO TABS: ORAL_TABLET | ORAL | 0 refills | Status: DC

## 2024-06-19 NOTE — Patient Instructions (Addendum)
 Thank you for coming in today.   Please get an Xray today before you leave   You should hear from MRI scheduling within 1 week. If you do not hear please let me know.    You have a Vascular Ultrasound scheduled for:  [facility] at [facility] [address], [address] [place], [place] [address]   I've sent a prescription for Prednisone  to your pharmacy.   Check back at your already scheduled July appointment

## 2024-06-23 ENCOUNTER — Ambulatory Visit

## 2024-06-23 DIAGNOSIS — M5416 Radiculopathy, lumbar region: Secondary | ICD-10-CM | POA: Diagnosis not present

## 2024-06-24 ENCOUNTER — Ambulatory Visit: Payer: Self-pay | Admitting: Family Medicine

## 2024-06-24 DIAGNOSIS — G8929 Other chronic pain: Secondary | ICD-10-CM

## 2024-06-24 DIAGNOSIS — M5416 Radiculopathy, lumbar region: Secondary | ICD-10-CM

## 2024-06-24 DIAGNOSIS — I739 Peripheral vascular disease, unspecified: Secondary | ICD-10-CM

## 2024-06-24 NOTE — Progress Notes (Signed)
 Lumbar spine MRI shows the potential for a pinched nerve.  I am going to order an epidural steroid injection.  You should hear for soon from radiology about scheduling.

## 2024-06-24 NOTE — Progress Notes (Signed)
 Lumbar spine x-ray shows a little bit of back arthritis but also a kidney stone on the left side.  I will make sure your primary care provider is aware.  I do not recall you having a lot of back or flank pain that would be from this kidney stone.  Are you having back or flank pain or pain going to your bladder?

## 2024-07-02 NOTE — Telephone Encounter (Signed)
 Forwarding to Dr. [name] to review and advise.

## 2024-07-03 NOTE — Telephone Encounter (Signed)
 Forwarding to Dr. [name] to review and advise.

## 2024-07-11 ENCOUNTER — Ambulatory Visit (HOSPITAL_COMMUNITY)
Admission: RE | Admit: 2024-07-11 | Discharge: 2024-07-11 | Disposition: A | Discharge status: Home / Self Care | Source: Ambulatory Visit | Attending: Family Medicine | Admitting: Family Medicine

## 2024-07-11 DIAGNOSIS — I739 Peripheral vascular disease, unspecified: Secondary | ICD-10-CM | POA: Insufficient documentation

## 2024-07-11 NOTE — Progress Notes (Signed)
 Vascular ultrasound test shows that the left leg blood flow is less than what it should be.  This could be producing your pain.  I have referred you to vascular surgery to evaluate this further and offer treatment.  Treatment can sometimes be nonsurgical with medications and other options and sometimes does require surgery.  The vascular surgeons are going to be well equipped to help you figure that out. You should hear from [facility] vascular surgery shortly.

## 2024-07-14 LAB — VAS US ABI WITH/WO TBI
Left ABI: 0.79
Right ABI: 1.06

## 2024-07-15 ENCOUNTER — Ambulatory Visit: Admitting: Family Medicine

## 2024-07-15 NOTE — Progress Notes (Deleted)
 LILLETTE Ileana Collet, PhD, LAT, ATC acting as a scribe for Artist Lloyd, MD.  Misty Bailey is a 53 y.o. female who presents to [facility] at [facility] today for f/u lumbar radiculopathy. Pt was last seen by Dr. Lloyd on 06/19/24 and a l-spine MRI and vasc US  were ordered. Also prescribed prednisone . Based on MRI findings, ESI was ordered, but never performed.   Based on vasc US  findings, she was referred to vascular surgery.  Today, pt reports ***  Dx testing: 07/11/24 Vasc US  06/23/24 L-spine MRI  06/19/24 L-spine XR  Pertinent review of systems: ***  Relevant historical information: ***   Exam:  There were no vitals taken for this visit. General: Well Developed, well nourished, and in no acute distress.   MSK: ***    Lab and Radiology Results No results found for this or any previous visit (from the past 72 hours). VAS US  ABI WITH/WO TBI Result Date: 07/14/2024  LOWER EXTREMITY DOPPLER STUDY Patient Name:  Misty Bailey  Date of Exam:   07/11/2024 Medical Rec #: [id number]          Accession #:    [id number] Date of Birth: 1971-10-15         Patient Gender: F Patient Age:   25 years Exam Location:  [address] Procedure:      VAS US  ABI WITH/WO TBI Referring Phys: EVAN COREY --------------------------------------------------------------------------------  Indications: Claudication, and peripheral artery disease. High Risk Factors: Hypertension, hyperlipidemia, past history of smoking.  Comparison Study: N/A Performing Technologist: Dena Pane  Examination Guidelines: A complete evaluation includes at minimum, Doppler waveform signals and systolic blood pressure reading at the level of bilateral brachial, anterior tibial, and posterior tibial arteries, when vessel segments are accessible. Bilateral testing is considered an integral part of a complete examination. Photoelectric Plethysmograph (PPG) waveforms and toe systolic pressure readings are included as  required and additional duplex testing as needed. Limited examinations for reoccurring indications may be performed as noted.  ABI Findings: +---------+------------------+-----+-----------+--------+ Right    Rt Pressure (mmHg)IndexWaveform   Comment  +---------+------------------+-----+-----------+--------+ Brachial 132                    triphasic           +---------+------------------+-----+-----------+--------+ PTA      144               1.06 multiphasic         +---------+------------------+-----+-----------+--------+ DP       129               0.95 multiphasic         +---------+------------------+-----+-----------+--------+ Great Toe117               0.86                     +---------+------------------+-----+-----------+--------+ +---------+------------------+-----+----------+-------+ Left     Lt Pressure (mmHg)IndexWaveform  Comment +---------+------------------+-----+----------+-------+ Brachial 136                    triphasic         +---------+------------------+-----+----------+-------+ PTA      107               0.79 monophasic        +---------+------------------+-----+----------+-------+ DP       105               0.77 monophasic        +---------+------------------+-----+----------+-------+  Great Toe60                0.44                   +---------+------------------+-----+----------+-------+ +-------+-----------+-----------+------------+------------+ ABI/TBIToday's ABIToday's TBIPrevious ABIPrevious TBI +-------+-----------+-----------+------------+------------+ Right  1.06       0.86                                +-------+-----------+-----------+------------+------------+ Left   0.79       0.44                                +-------+-----------+-----------+------------+------------+  Summary: Right: Resting right ankle-brachial index is within normal range. The right toe-brachial index is normal. Left: Resting left  ankle-brachial index indicates moderate left lower extremity arterial disease. The left toe-brachial index is abnormal. Consider complete lower extremity duplex if clinically indicated. Consider vascular consult if clinically indicated. *See table(s) above for measurements and observations.  Electronically signed by Gordy Bergamo MD on 07/14/2024 at 11:06:01 AM.    Final        Assessment and Plan: 53 y.o. female with ***   PDMP not reviewed this encounter. No orders of the defined types were placed in this encounter.  No orders of the defined types were placed in this encounter.    Discussed warning signs or symptoms. Please see discharge instructions. Patient expresses understanding.   ***

## 2024-07-16 NOTE — Progress Notes (Unsigned)
   LILLETTE Ileana Collet, PhD, LAT, ATC acting as a scribe for Artist Lloyd, MD.  Yale CROME Walpole is a 53 y.o. female who presents to [facility] at [facility] today for f/u lumbar radiculopathy. Pt was last seen by Dr. Lloyd on 06/19/24 and a l-spine MRI and vasc US  were ordered. Also prescribed prednisone . Based on MRI findings, ESI was ordered, but never performed.   Based on vasc US  findings, she was referred to vascular surgery.  Today, pt reports ***  Dx testing: 07/11/24 Vasc US  06/23/24 L-spine MRI  06/19/24 L-spine XR  Pertinent review of systems: ***  Relevant historical information: ***   Exam:  There were no vitals taken for this visit. General: Well Developed, well nourished, and in no acute distress.   MSK: ***    Lab and Radiology Results No results found for this or any previous visit (from the past 72 hours). No results found.      Assessment and Plan: 53 y.o. female with ***   PDMP not reviewed this encounter. No orders of the defined types were placed in this encounter.  No orders of the defined types were placed in this encounter.    Discussed warning signs or symptoms. Please see discharge instructions. Patient expresses understanding.   ***

## 2024-07-17 ENCOUNTER — Ambulatory Visit: Payer: Self-pay | Admitting: Family Medicine

## 2024-07-17 ENCOUNTER — Ambulatory Visit (INDEPENDENT_AMBULATORY_CARE_PROVIDER_SITE_OTHER): Admitting: Family Medicine

## 2024-07-17 VITALS — Ht 61.0 in | Wt 146.0 lb

## 2024-07-17 DIAGNOSIS — M5416 Radiculopathy, lumbar region: Secondary | ICD-10-CM

## 2024-07-17 DIAGNOSIS — E538 Deficiency of other specified B group vitamins: Secondary | ICD-10-CM

## 2024-07-17 DIAGNOSIS — I739 Peripheral vascular disease, unspecified: Secondary | ICD-10-CM

## 2024-07-17 LAB — VITAMIN B12: Vitamin B-12: 308 pg/mL (ref 211–911)

## 2024-07-17 NOTE — Progress Notes (Signed)
 Vitamin B12 level looks okay.  Continue current medications for B12.

## 2024-07-18 ENCOUNTER — Telehealth: Payer: Self-pay

## 2024-07-18 NOTE — Telephone Encounter (Signed)
 Message received from Dr Vicci noting the patient is having difficulty affording her medications and medical care. She currently has a Land but I need to see if she is eligible for Medicaid. Her income is limited.  I called and left a message for her requesting a call back.

## 2024-07-19 ENCOUNTER — Encounter: Payer: Self-pay | Admitting: Obstetrics and Gynecology

## 2024-07-25 NOTE — Telephone Encounter (Signed)
 I tried to reach the patient at [phone] and the message stated the call cannot be completed as dialed. I also called [phone] and had to leave a message requesting a call back.

## 2024-08-01 NOTE — Telephone Encounter (Signed)
 I tried to reach the patient again : [phone] and had to leave a message requesting a call back.

## 2024-08-08 ENCOUNTER — Other Ambulatory Visit: Payer: Self-pay

## 2024-08-08 NOTE — Telephone Encounter (Signed)
 Per Burnard Lot, Pharm Tech [facility]:  She has [place] so patient assistance is not an option. I can run a test claim for prednisone    test claims are not processing accurately for me-she may have a deductible or out of pocket amount? her pharmacy may be able to explain it more to her. depending on the pharmacy they may have a $4/$10 list, goodrx. prednisone  50mg  is different pricing as opposed to the 10mg  and 20mg , so it may be cheaper to have the provider change it      I will call the patient and explain this information from [place]

## 2024-08-08 NOTE — Telephone Encounter (Signed)
 I spoke to the patient about her struggles with affording her medical care/medications.  She explained that she has been out of work for about a year and hopes to return to work  soon.  She stated she receives $2200/month long term disability, which is only a portion of what she was making,  and has health insurance through her employer which costs her $150 every 2 weeks.   Her current income would most likely make her ineligible for Medicaid.  We discussed the option of insurance plans through the [facility]place.  She may have an option for a plan that would offer more coverage for her medications. However, if she is to return to work next month, she may not need another plan because there is a good chance the policy would not start until September.   She currently gets her medications filled at [facility] or [facility].  She said she has all of her medications except the prednisone  and mobic . Regarding her medical care copays, she said they are generally $40.    I told her that [facility] will [facility] to see if there are any patient assistance plans she may be eligible for and I will get back to her with that information.

## 2024-08-13 ENCOUNTER — Encounter: Payer: Self-pay | Admitting: Vascular Surgery

## 2024-08-13 ENCOUNTER — Ambulatory Visit: Attending: Vascular Surgery | Admitting: Vascular Surgery

## 2024-08-13 VITALS — BP 124/86 | HR 85 | Temp 98.4°F | Resp 20 | Ht 61.0 in | Wt 143.3 lb

## 2024-08-13 DIAGNOSIS — I739 Peripheral vascular disease, unspecified: Secondary | ICD-10-CM | POA: Diagnosis not present

## 2024-08-13 NOTE — Progress Notes (Signed)
 Patient name: Misty Bailey MRN: [id number] DOB: 12-Jun-1971 Sex: female  REASON FOR CONSULT: Leg pain w/ abnormal ABI  HPI: Misty Bailey is a 53 y.o. female, with history of prediabetes and hyperlipidemia that presents for evaluation of leg pain.  She states she was having pain in her left ankle that started back in May when [place].  This has gotten significantly better.  As part of the workup she had ABIs by her PCP with an ABI of 1.06 on the right multiphasic and 0.79 on the left monophasic.  He states her left leg ankle pain has gotten much better.  She really denies pain when she is walking.  States she is doing a lot of therapy on her own and this has made her leg better.  Past Medical History:  Diagnosis Date   Anemia    Anxiety    Arthritis 02/23/2014   Rib Cage   Asthma    Depression    Diverticulosis    Dysmenorrhea 05/08/2018   Essential hypertension 07/29/2019   HLD (hyperlipidemia)    Panic attacks    Pre-diabetes    Suppurative hidradenitis     Past Surgical History:  Procedure Laterality Date   CHOLECYSTECTOMY N/A 06/28/2023   Procedure: LAPAROSCOPIC CHOLECYSTECTOMY;  Surgeon: Vernetta Berg, MD;  Location: [facility] OR;  Service: General;  Laterality: N/A;  TAP BLOCK   INGUINAL HERNIA REPAIR Left 06/28/2023   Procedure: OPEN LEFT INGUINAL HERNIA REPAIR WITH MESH;  Surgeon: Vernetta Berg, MD;  Location: [facility] OR;  Service: General;  Laterality: Left;   oral surgery     PELVIC LAPAROSCOPY  1998/1999 `   pelvic adhesions and pain   TOE SURGERY Left    great toe   WISDOM TOOTH EXTRACTION      Family History  Problem Relation Age of Onset   Diabetes Mother    Hypertension Mother    Hyperlipidemia Mother    Heart murmur Mother    Crohn's disease Mother    Hypertension Father    Stroke Father    Hyperlipidemia Father    Diabetes Sister    Hypertension Sister    Hyperlipidemia Sister    Hypertension Maternal Grandmother     Diabetes Maternal Grandmother    Osteoporosis Maternal Grandmother    Cirrhosis Maternal Grandfather    Thyroid disease Paternal Aunt    Breast cancer Neg Hx     SOCIAL HISTORY: Social History   Socioeconomic History   Marital status: Single    Spouse name: Not on file   Number of children: 0   Years of education: Associates   Highest education level: Associate degree: occupational, Scientist, product/process development, or vocational program  Occupational History   Not on file  Tobacco Use   Smoking status: Former    Current packs/day: 0.00    Types: Cigarettes    Quit date: 1996    Years since quitting: 29.6   Smokeless tobacco: Never   Tobacco comments:    Weed  Vaping Use   Vaping status: Never Used  Substance and Sexual Activity   Alcohol use: Yes    Comment: social   Drug use: Yes    Frequency: 3.0 times per week    Types: Marijuana   Sexual activity: Yes    Partners: Male    Birth control/protection: None  Other Topics Concern   Not on file  Social History Narrative   Boyfriend incarcerated 2014-2018   Social Drivers of  Health   Financial Resource Strain: Medium Risk (01/05/2024)   Overall Financial Resource Strain (CARDIA)    Difficulty of Paying Living Expenses: Somewhat hard  Food Insecurity: Food Insecurity Present (01/05/2024)   Hunger Vital Sign    Worried About Running Out of Food in the Last Year: Sometimes true    Ran Out of Food in the Last Year: Sometimes true  Transportation Needs: No Transportation Needs (01/05/2024)   PRAPARE - Administrator, Civil Service (Medical): No    Lack of Transportation (Non-Medical): No  Physical Activity: Insufficiently Active (01/05/2024)   Exercise Vital Sign    Days of Exercise per Week: 3 days    Minutes of Exercise per Session: 20 min  Stress: Stress Concern Present (01/05/2024)   [employer] of Occupational Health - Occupational Stress Questionnaire    Feeling of Stress : To some extent  Social Connections:  Moderately Integrated (01/05/2024)   Social Connection and Isolation Panel    Frequency of Communication with Friends and Family: More than three times a week    Frequency of Social Gatherings with Friends and Family: Twice a week    Attends Religious Services: More than 4 times per year    Active Member of [employer] or Organizations: No    Attends Engineer, structural: More than 4 times per year    Marital Status: Never married  Recent Concern: Social Connections - Moderately Isolated (10/26/2023)   Social Connection and Isolation Panel    Frequency of Communication with Friends and Family: More than three times a week    Frequency of Social Gatherings with Friends and Family: More than three times a week    Attends Religious Services: More than 4 times per year    Active Member of [employer] or Organizations: No    Attends Engineer, structural: Not on file    Marital Status: Never married  Intimate Partner Violence: Not At Risk (11/10/2023)   Humiliation, Afraid, Rape, and Kick questionnaire    Fear of Current or Ex-Partner: No    Emotionally Abused: No    Physically Abused: No    Sexually Abused: No    Allergies  Allergen Reactions   Codeine  Shortness Of Breath    Current Outpatient Medications  Medication Sig Dispense Refill   albuterol  (VENTOLIN  HFA) 108 (90 Base) MCG/ACT inhaler Inhale 2 puffs into the lungs every 6 (six) hours as needed for wheezing or shortness of breath. 8 g 4   amLODipine  (NORVASC ) 10 MG tablet Take 1 tablet (10 mg total) by mouth daily. 90 tablet 1   atorvastatin  (LIPITOR) 10 MG tablet TAKE 1 TABLET BY MOUTH EVERY DAY 90 tablet 1   desonide (DESOWEN) 0.05 % ointment Apply 1 Application topically daily as needed (rash).     estradiol  (ESTRACE  VAGINAL) 0.1 MG/GM vaginal cream Apply 1/2 gram to vulva nightly for 2 weeks then decrease to 1/2 gram to vulva two nights a week. Do not use applicator. 42.5 g 1   fluticasone  (FLONASE ) 50 MCG/ACT nasal  spray Place 1 spray into both nostrils daily. 16 g 1   gabapentin  (NEURONTIN ) 100 MG capsule Take 100 mg by mouth as needed.     losartan  (COZAAR ) 25 MG tablet Take 1 tablet (25 mg total) by mouth daily. 90 tablet 1   meloxicam  (MOBIC ) 7.5 MG tablet Take 2 tablets (15 mg total) by mouth daily. 60 tablet 0   metroNIDAZOLE  (METROGEL ) 0.75 % vaginal gel Place 1 Applicatorful vaginally  2 (two) times a week for 52 doses. 6 months of treatment. 70 g 7   predniSONE  (DELTASONE ) 50 MG tablet Take 1 pill daily for 5 days 5 tablet 0   Current Facility-Administered Medications  Medication Dose Route Frequency Provider Last Rate Last Admin   cyanocobalamin  (VITAMIN B12) injection 1,000 mcg  1,000 mcg Intramuscular Once         REVIEW OF SYSTEMS:  [X]  denotes positive finding, [ ]  denotes negative finding Cardiac  Comments:  Chest pain or chest pressure:    Shortness of breath upon exertion:    Short of breath when lying flat:    Irregular heart rhythm:        Vascular    Pain in calf, thigh, or hip brought on by ambulation:    Pain in feet at night that wakes you up from your sleep:     Blood clot in your veins:    Leg swelling:         Pulmonary    Oxygen  at home:    Productive cough:     Wheezing:         Neurologic    Sudden weakness in arms or legs:     Sudden numbness in arms or legs:     Sudden onset of difficulty speaking or slurred speech:    Temporary loss of vision in one eye:     Problems with dizziness:         Gastrointestinal    Blood in stool:     Vomited blood:         Genitourinary    Burning when urinating:     Blood in urine:        Psychiatric    Major depression:         Hematologic    Bleeding problems:    Problems with blood clotting too easily:        Skin    Rashes or ulcers:        Constitutional    Fever or chills:      PHYSICAL EXAM: Vitals:   08/13/24 1356  BP: 124/86  Pulse: 85  Resp: 20  Temp: 98.4 F (36.9 C)  TempSrc: Temporal   SpO2: 98%  Weight: 143 lb 4.8 oz (65 kg)  Height: 5' 1 (1.549 m)    GENERAL: The patient is a well-nourished female, in no acute distress. The vital signs are documented above. CARDIAC: There is a regular rate and rhythm.  VASCULAR:  Bilateral femoral pulses palpable Right DP palpable No palpable left pedal pulses No tissue loss PULMONARY: No respiratory distress ABDOMEN: Soft and non-tender. MUSCULOSKELETAL: There are no major deformities or cyanosis. NEUROLOGIC: No focal weakness or paresthesias are detected. PSYCHIATRIC: The patient has a normal affect.  DATA:   ABIs 07/11/2024 1.06 on the right triphasic and 0.79 on the left monophasic  Assessment/Plan:  53 y.o. female, with history of prediabetes and hyperlipidemia that presents for evaluation of left leg pain.  She states she was having pain in her left ankle that started back in May when [place].  This has gotten significantly better.  As part of the workup she had ABIs by her PCP with an ABI of 1.06 on the right multiphasic and 0.79 on the left monophasic.    Fortunately her left leg pain has gotten much better.  She describes most of this around the ankle and it does not sound like classic claudication  symptoms.  She tells me today that she really does not get any pain when walking at this point.  I had said with her continued improvement I would take a very conservative approach.  Does have evidence of moderate PAD in the left.  I discussed the importance of taking her statin therapy as well as 81 mg aspirin daily and starting walking therapies.  I encouraged her to walk 30 minutes a day at least 3 times a week to build collateral flow.  Ultimately I will see her in 1 year with repeat ABIs and she needs to undergo surveillance in our office.  Unless she develops worsening symptoms I would not pursue any angiography at this time.  I'm not starting her on Pletal today as she denies any significant cramping  or pain when walking at this time.   Lonni DOROTHA Gaskins, MD [facility] Office: [phone]

## 2024-08-15 NOTE — Telephone Encounter (Signed)
 I called the patient to explain the response from Burnard Lot, Pharm Tech and I had to leave a message requesting a call back.

## 2024-08-19 ENCOUNTER — Telehealth: Payer: Self-pay | Admitting: Family Medicine

## 2024-08-19 NOTE — Telephone Encounter (Signed)
 Misty Bailey with the [facility] called regarding Long term disability and stated they received the records they requested but they did not receive an attending Physician Statement form. I asked that he send the forms to our office and he provided a call back number of [phone] and a fax number of [phone]

## 2024-08-23 ENCOUNTER — Ambulatory Visit (HOSPITAL_COMMUNITY)

## 2024-08-24 ENCOUNTER — Ambulatory Visit (HOSPITAL_COMMUNITY)

## 2024-08-27 NOTE — Telephone Encounter (Signed)
 Due date 08/22/24.   Called [employer] [facility] and requested extension through the end of the week d/t my being out of the office last week and Monday of this week.

## 2024-08-27 NOTE — Telephone Encounter (Signed)
Form received today.

## 2024-08-28 NOTE — Telephone Encounter (Signed)
Called pt, number not in service.

## 2024-09-02 NOTE — Telephone Encounter (Signed)
 Form reviewed and signed by Dr. [name], placed at the front desk for faxing/scanning.

## 2024-09-02 NOTE — Telephone Encounter (Signed)
 Form completed, placed on Dr. Zollie Pee desk to review and sign.

## 2024-09-18 ENCOUNTER — Telehealth: Payer: Self-pay | Admitting: Internal Medicine

## 2024-09-18 NOTE — Telephone Encounter (Signed)
 Confirmed appt for 9/25

## 2024-09-19 ENCOUNTER — Encounter: Payer: Self-pay | Admitting: Internal Medicine

## 2024-09-19 ENCOUNTER — Ambulatory Visit: Payer: Self-pay | Attending: Internal Medicine | Admitting: Internal Medicine

## 2024-09-19 ENCOUNTER — Other Ambulatory Visit: Payer: Self-pay

## 2024-09-19 VITALS — BP 164/84 | HR 73 | Temp 98.3°F | Ht 61.0 in | Wt 144.0 lb

## 2024-09-19 DIAGNOSIS — I10 Essential (primary) hypertension: Secondary | ICD-10-CM | POA: Diagnosis not present

## 2024-09-19 DIAGNOSIS — F330 Major depressive disorder, recurrent, mild: Secondary | ICD-10-CM

## 2024-09-19 DIAGNOSIS — J029 Acute pharyngitis, unspecified: Secondary | ICD-10-CM | POA: Diagnosis not present

## 2024-09-19 DIAGNOSIS — E538 Deficiency of other specified B group vitamins: Secondary | ICD-10-CM

## 2024-09-19 DIAGNOSIS — Z1231 Encounter for screening mammogram for malignant neoplasm of breast: Secondary | ICD-10-CM

## 2024-09-19 DIAGNOSIS — J069 Acute upper respiratory infection, unspecified: Secondary | ICD-10-CM

## 2024-09-19 DIAGNOSIS — E782 Mixed hyperlipidemia: Secondary | ICD-10-CM | POA: Diagnosis not present

## 2024-09-19 DIAGNOSIS — Z23 Encounter for immunization: Secondary | ICD-10-CM

## 2024-09-19 LAB — POCT RAPID STREP A (OFFICE): Rapid Strep A Screen: NEGATIVE

## 2024-09-19 MED ORDER — LOSARTAN POTASSIUM 50 MG PO TABS
50.0000 mg | ORAL_TABLET | Freq: Every day | ORAL | 6 refills | Status: AC
Start: 2024-09-19 — End: ?
  Filled 2024-09-19: qty 30, 30d supply, fill #0

## 2024-09-19 MED ORDER — BENZONATATE 100 MG PO CAPS
100.0000 mg | ORAL_CAPSULE | Freq: Two times a day (BID) | ORAL | 0 refills | Status: DC | PRN
  Filled 2024-09-19: qty 20, 10d supply, fill #0

## 2024-09-19 MED ORDER — ATORVASTATIN CALCIUM 10 MG PO TABS
10.0000 mg | ORAL_TABLET | Freq: Every day | ORAL | 1 refills | Status: DC
  Filled 2024-09-19: qty 90, 90d supply, fill #0

## 2024-09-19 MED ORDER — FLUTICASONE PROPIONATE 50 MCG/ACT NA SUSP
1.0000 | Freq: Every day | NASAL | 1 refills | Status: AC
  Filled 2024-09-19: qty 16, 60d supply, fill #0

## 2024-09-19 MED ORDER — AMLODIPINE BESYLATE 10 MG PO TABS
10.0000 mg | ORAL_TABLET | Freq: Every day | ORAL | 1 refills | Status: AC
  Filled 2024-09-19: qty 90, 90d supply, fill #0

## 2024-09-19 MED ORDER — ZOSTER VAC RECOMB ADJUVANTED 50 MCG/0.5ML IM SUSR: 0.5000 mL | Freq: Once | INTRAMUSCULAR | 0 refills | Status: AC

## 2024-09-19 MED ORDER — ALBUTEROL SULFATE HFA 108 (90 BASE) MCG/ACT IN AERS
2.0000 | INHALATION_SPRAY | Freq: Four times a day (QID) | RESPIRATORY_TRACT | 4 refills | Status: AC | PRN
  Filled 2024-09-19: qty 6.7, 25d supply, fill #0

## 2024-09-19 NOTE — Patient Instructions (Signed)
  VISIT SUMMARY: Today, we addressed your chronic cough, hypertension, B12 deficiency, and depression. We discussed your symptoms, medications, and made adjustments to your treatment plan.  YOUR PLAN: -VIRAL UPPER RESPIRATORY INFECTION WITH CHRONIC SINUSITIS AND ACUTE BRONCHOSPASM: You have a viral infection causing a persistent cough, congestion, and sore throat. We will do a throat swab to rule out strep throat. If the swab is negative, you can gargle with warm salt water or use throat lozenges. We prescribed Tec[place] cough tablets and refilled your albuterol  inhaler. Use Vicks under your nose and on your chest for congestion.  -ESSENTIAL HYPERTENSION: Your blood pressure is higher than desired. We increased your losartan  (Cozaar ) dose to 50 mg daily and advised you to check your blood pressure regularly at home. We also discussed limiting salt intake and trying a beetroot supplement. Follow up with the clinical pharmacist in six weeks for a blood pressure recheck.  -MIXED HYPERLIPIDEMIA: You have high cholesterol levels, which are being managed with atorvastatin  10 mg daily. Continue taking your medication as prescribed.  -VITAMIN B12 DEFICIENCY: Your recent B12 level is below the preferred level. We will recheck your B12 level with upcoming blood tests. Please update us  with your current B12 gummy dose via MyChart.  -DEPRESSION: You screened positive for depression and prefer counseling over medication. We will refer you to counseling services to help you manage your symptoms.  INSTRUCTIONS: Please follow up with the clinical pharmacist in six weeks for a blood pressure recheck. Additionally, we will recheck your B12 level with your upcoming blood tests. Update us  with your current B12 gummy dose via MyChart.                      Contains text generated by Abridge.                                 Contains text generated by Abridge.

## 2024-09-19 NOTE — Telephone Encounter (Signed)
 I met with the patient when she was in the clinic today.   She said she received my message but had not called me back.  She stated she is still receiving long term disability but is struggling to make ends meet.  She is hoping to go back to work in the next few months, but the doctor who needs to clear her to return is out of work himself.   We discussed the medication co-pays and I encouraged her to go to the [facility] here at [facility] and discuss her situation with the pharmacist to see if they can help her. I also explained to her that she can have her medications transferred to this pharmacy if she wishes.  I shared the information from Burnard Lot, [facility] and also explained that there may be generic or other brands available for certain medications that may be more affordable and the pharmacist would be the best person to understand her insurance coverage and med options.   She then spoke about difficulty affording food and utilities.  She stated she is very good at budgeting but is still struggling to pay the bills and get the food that she needs.  She has information about a food pantry in [place] but is interested in other options.  She has a car to get to the food pantries. She just paid her electric bill but then was informed about some community agencies that may be able to offer some financial assistance.  I shared the information about [email or web address] with  her and she brought it up on her phone. I helped her navigate how to find local resources in her area, including food pantries and utility assistance. She was very appreciative of that information.   I told her to please call me if she has any questions.

## 2024-09-19 NOTE — Progress Notes (Signed)
 Patient ID: Misty Bailey, female    DOB: [birth year]  MRN: [id number]  CC: Hypertension (HTN f/u./Cough, congestion, phlegm, sore throat, body aches X2 mo /Discuss beet root supplement /Discuss shingles & flu vax/)   Subjective: Misty Bailey is a 53 y.o. female who presents for chronic ds management. Her concerns today include:  Dysmenorrhea, uterine fibroid, diverticulosis, HL, HTN, hidradenitis, bronchospasm, B12 def. PreDM   Discussed the use of AI scribe software for clinical note transcription with the patient, who gave verbal consent to proceed.  History of Present Illness Misty Bailey is a 53 year old female with hypertension and B12 deficiency who presents for chronic disease management and acute respiratory symptoms.  She has been experiencing a cough for almost three months, which is sometimes productive with clear, yellow, or brown mucus. The cough is occasionally severe, even when using her inhaler. She also experiences congestion and a sore throat for the past one to two weeks, possibly due to the coughing. No fever is present, but she notes back pain, particularly from under her bra down on both sides, which she attributes to the coughing. She has been using previously prescribed cough medicine, which has not been effective, and over-the-counter cough syrup. She endorses an itchy throat, which she associates with allergies.  HTN/HL: she continues to take amlodipine  10 mg, Cozaar  25 mg, and atorvastatin  10 mg daily. She has not been checking her blood pressure at home as frequently as before. She limits salt intake. She has taken her morning dose of amlodipine  and Cozaar .  Regarding her B12 deficiency, she takes B12 gummies daily but does not recall the dose. Her B12 level was checked two months ago and was 308.   MDD: She screened positive for depression and having difficult times. Acknowledges the need for therapy, though financial constraints have been a barrier.  Prefers counseling rather than medication. She recently experienced the unexpected passing of her mother-in-law, which has been emotionally challenging.  HM: Due for mammogram.  She is agreeable to receiving the flu shot and pneumonia vaccine but we agreed as a nurse only visit to get it once she gets over her current respiratory illness.  We will schedule it.    Patient Active Problem List   Diagnosis Date Noted   PAD (peripheral artery disease) 08/13/2024   ASCUS with positive high risk HPV cervical 04/18/2024   Genitourinary syndrome of menopause 04/18/2024   Adhesive capsulitis of right shoulder 11/28/2023   B12 deficiency 11/28/2023   Neuropathy 06/22/2023   Inguinal hernia of left side without obstruction or gangrene 04/30/2020   Hyperlipidemia 07/30/2019   Syncope 07/29/2019   Essential hypertension 07/29/2019   Dysmenorrhea 05/08/2018   Herpes 03/21/2014   Suppurative hidradenitis    Depression      Current Outpatient Medications on File Prior to Visit  Medication Sig Dispense Refill   desonide (DESOWEN) 0.05 % ointment Apply 1 Application topically daily as needed (rash).     estradiol  (ESTRACE  VAGINAL) 0.1 MG/GM vaginal cream Apply 1/2 gram to vulva nightly for 2 weeks then decrease to 1/2 gram to vulva two nights a week. Do not use applicator. 42.5 g 1   gabapentin  (NEURONTIN ) 100 MG capsule Take 100 mg by mouth as needed.     metroNIDAZOLE  (METROGEL ) 0.75 % vaginal gel Place 1 Applicatorful vaginally 2 (two) times a week for 52 doses. 6 months of treatment. 70 g 7   meloxicam  (MOBIC ) 7.5 MG tablet Take 2 tablets (15 mg  total) by mouth daily. 60 tablet 0   Current Facility-Administered Medications on File Prior to Visit  Medication Dose Route Frequency Provider Last Rate Last Admin   cyanocobalamin  (VITAMIN B12) injection 1,000 mcg  1,000 mcg Intramuscular Once         Allergies  Allergen Reactions   Codeine  Shortness Of Breath    Social History   Socioeconomic  History   Marital status: Single    Spouse name: Not on file   Number of children: 0   Years of education: Associates   Highest education level: Associate degree: occupational, Scientist, product/process development, or vocational program  Occupational History   Not on file  Tobacco Use   Smoking status: Former    Current packs/day: 0.00    Types: Cigarettes    Quit date: 1996    Years since quitting: 29.7   Smokeless tobacco: Never   Tobacco comments:    Weed  Advertising account planner   Vaping status: Never Used  Substance and Sexual Activity   Alcohol use: Yes    Comment: social   Drug use: Yes    Frequency: 3.0 times per week    Types: Marijuana   Sexual activity: Yes    Partners: Male    Birth control/protection: None  Other Topics Concern   Not on file  Social History Narrative   Boyfriend incarcerated 2014-2018   Social Drivers of Health   Financial Resource Strain: Medium Risk (09/16/2024)   Overall Financial Resource Strain (CARDIA)    Difficulty of Paying Living Expenses: Somewhat hard  Food Insecurity: Food Insecurity Present (09/16/2024)   Hunger Vital Sign    Worried About Running Out of Food in the Last Year: Sometimes true    Ran Out of Food in the Last Year: Sometimes true  Transportation Needs: No Transportation Needs (09/16/2024)   PRAPARE - Administrator, Civil Service (Medical): No    Lack of Transportation (Non-Medical): No  Physical Activity: Insufficiently Active (09/16/2024)   Exercise Vital Sign    Days of Exercise per Week: 2 days    Minutes of Exercise per Session: 20 min  Stress: Stress Concern Present (09/16/2024)   [employer] of Occupational Health - Occupational Stress Questionnaire    Feeling of Stress: Rather much  Social Connections: Moderately Integrated (09/16/2024)   Social Connection and Isolation Panel    Frequency of Communication with Friends and Family: More than three times a week    Frequency of Social Gatherings with Friends and Family: More than  three times a week    Attends Religious Services: More than 4 times per year    Active Member of [employer] or Organizations: Yes    Attends Banker Meetings: More than 4 times per year    Marital Status: Never married  Intimate Partner Violence: Not At Risk (11/10/2023)   Humiliation, Afraid, Rape, and Kick questionnaire    Fear of Current or Ex-Partner: No    Emotionally Abused: No    Physically Abused: No    Sexually Abused: No    Family History  Problem Relation Age of Onset   Diabetes Mother    Hypertension Mother    Hyperlipidemia Mother    Heart murmur Mother    Crohn's disease Mother    Hypertension Father    Stroke Father    Hyperlipidemia Father    Diabetes Sister    Hypertension Sister    Hyperlipidemia Sister    Hypertension Maternal Grandmother    Diabetes Maternal  Grandmother    Osteoporosis Maternal Grandmother    Cirrhosis Maternal Grandfather    Thyroid disease Paternal Aunt    Breast cancer Neg Hx     Past Surgical History:  Procedure Laterality Date   CHOLECYSTECTOMY N/A 06/28/2023   Procedure: LAPAROSCOPIC CHOLECYSTECTOMY;  Surgeon: Vernetta Berg, MD;  Location: [facility] OR;  Service: General;  Laterality: N/A;  TAP BLOCK   INGUINAL HERNIA REPAIR Left 06/28/2023   Procedure: OPEN LEFT INGUINAL HERNIA REPAIR WITH MESH;  Surgeon: Vernetta Berg, MD;  Location: [facility] OR;  Service: General;  Laterality: Left;   oral surgery     PELVIC LAPAROSCOPY  1998/1999 `   pelvic adhesions and pain   TOE SURGERY Left    great toe   WISDOM TOOTH EXTRACTION      ROS: Review of Systems Negative except as stated above  PHYSICAL EXAM: BP (!) 164/84   Pulse 73   Temp 98.3 F (36.8 C) (Oral)   Ht 5' 1 (1.549 m)   Wt 144 lb (65.3 kg)   SpO2 98%   BMI 27.21 kg/m   Physical Exam  General appearance - alert, well appearing, and in no distress. She has mild audible congestion Mental status - pt tearful when talking about the recent and unexpected death of  her mother-in-law Eyes - pupils equal and reactive, extraocular eye movements intact Nose - normal and patent, no erythema, discharge or polyps Mouth - slight erythema but no exudates of throat. No oral lesions Neck - supple, no significant adenopathy Chest - clear to auscultation, no wheezes, rales or rhonchi, symmetric air entry Heart - normal rate, regular rhythm, normal S1, S2, no murmurs, rubs, clicks or gallops Extremities - peripheral pulses normal, no pedal edema, no clubbing or cyanosis     09/19/2024    9:46 AM 11/10/2023    3:58 PM 10/26/2023    2:34 PM  Depression screen PHQ 2/9  Decreased Interest 2 1 2   Down, Depressed, Hopeless 3 2 3   PHQ - 2 Score 5 3 5   Altered sleeping 2 3 3   Tired, decreased energy 2 3 3   Change in appetite 2 1 3   Feeling bad or failure about yourself  3 2 3   Trouble concentrating 0 0 0  Moving slowly or fidgety/restless 0 0 0  Suicidal thoughts 0 0 0  PHQ-9 Score 14 12 17   Difficult doing work/chores Somewhat difficult  Not difficult at all       Latest Ref Rng & Units 10/23/2023   11:46 AM 06/21/2023    2:00 PM 05/11/2023   12:09 PM  CMP  Glucose 70 - 99 mg/dL 892  898  889   BUN 6 - 20 mg/dL 15  15  17    Creatinine 0.44 - 1.00 mg/dL 9.03  8.90  8.97   Sodium 135 - 145 mmol/L 140  140  143   Potassium 3.5 - 5.1 mmol/L 3.8  3.6  4.2   Chloride 98 - 111 mmol/L 106  103  108   CO2 22 - 32 mmol/L 27  24  21    Calcium  8.9 - 10.3 mg/dL 9.6  9.7  9.4    Lipid Panel     Component Value Date/Time   CHOL 213 (H) 02/03/2021 1005   TRIG 78 02/03/2021 1005   HDL 56 02/03/2021 1005   CHOLHDL 3.8 02/03/2021 1005   LDLCALC 143 (H) 02/03/2021 1005    CBC    Component Value Date/Time   WBC 6.1  10/23/2023 1146   RBC 4.72 10/23/2023 1146   HGB 13.5 10/23/2023 1146   HGB 13.6 08/17/2021 1037   HCT 42.2 10/23/2023 1146   HCT 42.3 08/17/2021 1037   PLT 277 10/23/2023 1146   PLT 312 08/17/2021 1037   MCV 89.4 10/23/2023 1146   MCV 89  08/17/2021 1037   MCH 28.6 10/23/2023 1146   MCHC 32.0 10/23/2023 1146   RDW 14.0 10/23/2023 1146   RDW 12.6 08/17/2021 1037   LYMPHSABS 2.7 01/25/2018 1106   MONOABS 0.5 08/09/2013 1045   EOSABS 0.2 01/25/2018 1106   BASOSABS 0.0 01/25/2018 1106   Results for orders placed or performed in visit on 09/19/24  POCT rapid strep A   Collection Time: 09/19/24 12:09 PM  Result Value Ref Range   Rapid Strep A Screen Negative Negative     ASSESSMENT AND PLAN: 1. Essential hypertension (Primary) Not at goal.  Continue amlodipine  10 mg daily.  She is agreeable to increasing Cozaar  to 50 mg daily.  Advised to check blood pressure at least once a week with goal being 130/80 or lower. - amLODipine  (NORVASC ) 10 MG tablet; Take 1 tablet (10 mg total) by mouth daily.  Dispense: 90 tablet; Refill: 1 - losartan  (COZAAR ) 50 MG tablet; Take 1 tablet (50 mg total) by mouth daily.  Dispense: 30 tablet; Refill: 6 - CBC - Comprehensive metabolic panel with GFR  2. Mixed hyperlipidemia Continue atorvastatin .  Due for lipid profile check - atorvastatin  (LIPITOR) 10 MG tablet; Take 1 tablet (10 mg total) by mouth daily.  Dispense: 90 tablet; Refill: 1 - Lipid panel  3. Viral upper respiratory tract infection Recommend symptomatic treatment.  Prescription given for Tessalon  Perles.  Refill albuterol  inhaler.  Advised to use Vicks vapor rub on the chest and under the nose to help with congestion. - albuterol  (VENTOLIN  HFA) 108 (90 Base) MCG/ACT inhaler; Inhale 2 puffs into the lungs every 6 (six) hours as needed for wheezing or shortness of breath.  Dispense: 6.7 g; Refill: 4 - benzonatate  (TESSALON  PERLES) 100 MG capsule; Take 1 capsule (100 mg total) by mouth 2 (two) times daily as needed.  Dispense: 20 capsule; Refill: 0  4. Pharyngitis, unspecified etiology Strep test negative - POCT rapid strep A  5. Major depressive disorder, recurrent episode, mild Referral to [facility] for counseling - Ambulatory  referral to Psychiatry  6. Encounter for screening mammogram for malignant neoplasm of breast - MM 3D SCREENING MAMMOGRAM BILATERAL BREAST; Future  7. Need for shingles vaccine Printed rxn given to pt to take to any outside pharmacy to get 1st shingrix shot - Zoster Vaccine Adjuvanted [facility]) injection; Inject 0.5 mLs into the muscle once for 1 dose.  Dispense: 0.5 mL; Refill: 0  8. Vitamin B 12 deficiency Most recent B12 level was in the low normal range.  Advised to let me know via MyChart the dose of the vitamin B12 supplement she is currently taking. - Vitamin B12    Patient was given the opportunity to ask questions.  Patient verbalized understanding of the plan and was able to repeat key elements of the plan.   This documentation was completed using Paediatric nurse.  Any transcriptional errors are unintentional.  Orders Placed This Encounter  Procedures   MM 3D SCREENING MAMMOGRAM BILATERAL BREAST   CBC   Comprehensive metabolic panel with GFR   Lipid panel   Vitamin B12   Ambulatory referral to Psychiatry   POCT rapid strep A  Requested Prescriptions   Signed Prescriptions Disp Refills   amLODipine  (NORVASC ) 10 MG tablet 90 tablet 1    Sig: Take 1 tablet (10 mg total) by mouth daily.   atorvastatin  (LIPITOR) 10 MG tablet 90 tablet 1    Sig: Take 1 tablet (10 mg total) by mouth daily.   fluticasone  (FLONASE ) 50 MCG/ACT nasal spray 16 g 1    Sig: Place 1 spray into both nostrils daily.   albuterol  (VENTOLIN  HFA) 108 (90 Base) MCG/ACT inhaler 6.7 g 4    Sig: Inhale 2 puffs into the lungs every 6 (six) hours as needed for wheezing or shortness of breath.   losartan  (COZAAR ) 50 MG tablet 30 tablet 6    Sig: Take 1 tablet (50 mg total) by mouth daily.   benzonatate  (TESSALON  PERLES) 100 MG capsule 20 capsule 0    Sig: Take 1 capsule (100 mg total) by mouth 2 (two) times daily as needed.   Zoster Vaccine Adjuvanted (SHINGRIX) injection 0.5 mL 0     Sig: Inject 0.5 mLs into the muscle once for 1 dose.    Return in about 4 months (around 01/19/2025) for [facility] in 4wks for BP check. RN visit in 1 wk for flu and PCV 20 .  Barnie Louder, MD, FACP

## 2024-09-20 ENCOUNTER — Other Ambulatory Visit: Payer: Self-pay | Admitting: Internal Medicine

## 2024-09-20 ENCOUNTER — Ambulatory Visit: Payer: Self-pay | Admitting: Internal Medicine

## 2024-09-20 DIAGNOSIS — E782 Mixed hyperlipidemia: Secondary | ICD-10-CM

## 2024-09-20 LAB — COMPREHENSIVE METABOLIC PANEL WITH GFR
ALT: 13 IU/L (ref 0–32)
AST: 16 IU/L (ref 0–40)
Albumin: 4.1 g/dL (ref 3.8–4.9)
Alkaline Phosphatase: 99 IU/L (ref 49–135)
BUN/Creatinine Ratio: 13 (ref 9–23)
BUN: 14 mg/dL (ref 6–24)
Bilirubin Total: 0.3 mg/dL (ref 0.0–1.2)
CO2: 22 mmol/L (ref 20–29)
Calcium: 9.6 mg/dL (ref 8.7–10.2)
Chloride: 106 mmol/L (ref 96–106)
Creatinine, Ser: 1.04 mg/dL — ABNORMAL HIGH (ref 0.57–1.00)
Globulin, Total: 2.4 g/dL (ref 1.5–4.5)
Glucose: 91 mg/dL (ref 70–99)
Potassium: 4.7 mmol/L (ref 3.5–5.2)
Sodium: 140 mmol/L (ref 134–144)
Total Protein: 6.5 g/dL (ref 6.0–8.5)
eGFR: 64 mL/min/1.73 (ref >59)

## 2024-09-20 LAB — CBC
Hematocrit: 45 % (ref 34.0–46.6)
Hemoglobin: 13.7 g/dL (ref 11.1–15.9)
MCH: 28.4 pg (ref 26.6–33.0)
MCHC: 30.4 g/dL — ABNORMAL LOW (ref 31.5–35.7)
MCV: 93 fL (ref 79–97)
Platelets: 252 x10E3/uL (ref 150–450)
RBC: 4.83 x10E6/uL (ref 3.77–5.28)
RDW: 13.1 % (ref 11.7–15.4)
WBC: 5.8 x10E3/uL (ref 3.4–10.8)

## 2024-09-20 LAB — LIPID PANEL
Chol/HDL Ratio: 3.8 ratio (ref 0.0–4.4)
Cholesterol, Total: 224 mg/dL — ABNORMAL HIGH (ref 100–199)
HDL: 59 mg/dL (ref >39)
LDL Chol Calc (NIH): 143 mg/dL — ABNORMAL HIGH (ref 0–99)
Triglycerides: 124 mg/dL (ref 0–149)
VLDL Cholesterol Cal: 22 mg/dL (ref 5–40)

## 2024-09-20 LAB — VITAMIN B12: Vitamin B-12: 340 pg/mL (ref 232–1245)

## 2024-09-20 MED ORDER — ATORVASTATIN CALCIUM 20 MG PO TABS: 20.0000 mg | ORAL_TABLET | Freq: Every day | ORAL | 1 refills | Status: AC

## 2024-09-26 ENCOUNTER — Ambulatory Visit

## 2024-10-01 ENCOUNTER — Ambulatory Visit: Attending: Internal Medicine

## 2024-10-01 DIAGNOSIS — Z23 Encounter for immunization: Secondary | ICD-10-CM | POA: Diagnosis not present

## 2024-10-01 NOTE — Progress Notes (Signed)
 Patient came in for a nurse visit only on 10/01/2024. Patient received a pneumonia 20 and flu vaccine. Patient tolerated vaccines well. VIS given for both immunizations. No further questions / concerns.

## 2024-10-11 ENCOUNTER — Ambulatory Visit
Admission: RE | Admit: 2024-10-11 | Discharge: 2024-10-11 | Disposition: A | Discharge status: Home / Self Care | Source: Ambulatory Visit | Attending: Internal Medicine | Admitting: Internal Medicine

## 2024-10-11 DIAGNOSIS — Z1231 Encounter for screening mammogram for malignant neoplasm of breast: Secondary | ICD-10-CM

## 2024-10-24 ENCOUNTER — Encounter: Payer: Self-pay | Admitting: Pharmacist

## 2024-10-24 ENCOUNTER — Ambulatory Visit: Attending: Internal Medicine | Admitting: Pharmacist

## 2024-10-24 VITALS — BP 132/87

## 2024-10-24 DIAGNOSIS — I10 Essential (primary) hypertension: Secondary | ICD-10-CM

## 2024-10-24 NOTE — Progress Notes (Signed)
 S:     No chief complaint on file.  53 y.o. female who presents for hypertension evaluation, education, and management.  PMH is significant for hypertension and hyperlipidemia.  Patient was referred and last seen by Primary Care Provider, Dr. Vicci, on 09/19/2024. BP at that visit was 164/84 mmHg. Dr. Vicci increased losartan  dose to 50 mg daily.  Today, patient arrives in good spirits and presents without assistance. Denies blurred vision, swelling. No headaches or dizziness.  Family/Social history:  Mother - diabetes, HTN, hyperlipidemia Father - hypertension, stroke, hyperlipidemia Sister - diabetes, hypertension, hyperlipidemia  Medication adherence: pt admits to suboptimal adherence. Has not taken today. Additionally, she has only taken BP medication ~3 days out of the last 7.  Current antihypertensives include: amlodipine  10 mg daily, losartan  50 mg daily  Antihypertensives tried in the past include: none  Reported home BP readings:  -No readings with her today.  Patient reported dietary habits: Eats 2-3 meals/day -The following from last year encounter with me:  Adheres to sodium restricted diet  Caffeine  - drinks 12 oz hot tea at work; soda 1-2 times a week Tries to stay away from fried food Occasionally has french fries  Patient-reported exercise habits: trying to exercise in the morning   ASCVD risk factors include: former smoker, HTN, HLD   O:  Vitals:   10/24/24 0858  BP: 132/87   Last 3 Office BP readings: BP Readings from Last 3 Encounters:  10/24/24 132/87  09/19/24 (!) 164/84  08/13/24 124/86   BMET    Component Value Date/Time   NA 140 09/19/2024 1140   K 4.7 09/19/2024 1140   CL 106 09/19/2024 1140   CO2 22 09/19/2024 1140   GLUCOSE 91 09/19/2024 1140   GLUCOSE 107 (H) 10/23/2023 1146   BUN 14 09/19/2024 1140   CREATININE 1.04 (H) 09/19/2024 1140   CREATININE 0.86 03/20/2014 1258   CALCIUM  9.6 09/19/2024 1140   GFRNONAA >60  10/23/2023 1146   GFRNONAA 70 08/09/2013 1045   GFRAA 78 02/03/2021 1005   GFRAA 81 08/09/2013 1045   Renal function: CrCl cannot be calculated (Patient's most recent lab result is older than the maximum 21 days allowed.).  Clinical ASCVD: No  The 10-year ASCVD risk score (Arnett DK, et al., 2019) is: 5.3%   Values used to calculate the score:     Age: 20 years     Clincally relevant sex: Female     Is Non-Hispanic African American: Yes     Diabetic: No     Tobacco smoker: No     Systolic Blood Pressure: 132 mmHg     Is BP treated: Yes     HDL Cholesterol: 59 mg/dL     Total Cholesterol: 224 mg/dL  A/P: Hypertension longstanding currently above goal on current medications. BP goal < 130/80 mmHg. Medication adherence appears to be suboptimal. I have encouraged better adherence and will recheck in 1 month. No changes today. I did encourage her to take her BP medication the morning of her visit with me.  -Continue losartan  50 mg daily. -Continued amlodipine  10 mg daily.  -Patient educated on purpose, proper use, and potential adverse effects of losartan .  -F/u labs ordered - none today -Counseled on lifestyle modifications for blood pressure control including reduced dietary sodium, increased exercise, adequate sleep. -Encouraged patient to check BP at home and bring log of readings to next visit. Counseled on proper use of home BP cuff. I think her BP values at home  are higher d/t taking in the AM before medications with a wrist cuff. Encouraged her to get a brachial cuff and take her home BP AFTER  medications.   Results reviewed and written information provided.    Written patient instructions provided. Patient verbalized understanding of treatment plan.  Total time in face to face counseling 30 minutes.    Follow-up:  W/ me in 1 month.  Misty Bailey, PharmD, JAQUELINE, CPP Clinical Pharmacist [facility] [phone]

## 2024-10-28 ENCOUNTER — Encounter: Payer: Self-pay | Admitting: Radiology

## 2024-11-26 ENCOUNTER — Ambulatory Visit: Admitting: Pharmacist

## 2024-12-03 ENCOUNTER — Encounter: Payer: Self-pay | Admitting: Family Medicine

## 2024-12-03 ENCOUNTER — Encounter: Payer: Self-pay | Admitting: Internal Medicine

## 2024-12-03 ENCOUNTER — Ambulatory Visit: Admitting: Obstetrics and Gynecology

## 2024-12-24 ENCOUNTER — Ambulatory Visit: Admitting: Pharmacist

## 2025-01-20 ENCOUNTER — Ambulatory Visit: Admitting: Internal Medicine

## 2025-01-31 ENCOUNTER — Encounter: Payer: Self-pay | Admitting: Internal Medicine

## 2025-01-31 ENCOUNTER — Ambulatory Visit: Admitting: Internal Medicine

## 2025-01-31 ENCOUNTER — Other Ambulatory Visit: Payer: Self-pay

## 2025-01-31 VITALS — BP 161/80 | HR 83 | Ht 61.0 in | Wt 146.0 lb

## 2025-01-31 DIAGNOSIS — E782 Mixed hyperlipidemia: Secondary | ICD-10-CM

## 2025-01-31 DIAGNOSIS — F411 Generalized anxiety disorder: Secondary | ICD-10-CM

## 2025-01-31 DIAGNOSIS — I10 Essential (primary) hypertension: Secondary | ICD-10-CM

## 2025-01-31 DIAGNOSIS — F330 Major depressive disorder, recurrent, mild: Secondary | ICD-10-CM

## 2025-01-31 DIAGNOSIS — E538 Deficiency of other specified B group vitamins: Secondary | ICD-10-CM

## 2025-01-31 DIAGNOSIS — M542 Cervicalgia: Secondary | ICD-10-CM

## 2025-01-31 MED ORDER — METHOCARBAMOL 500 MG PO TABS
500.0000 mg | ORAL_TABLET | Freq: Every day | ORAL | 0 refills | Status: AC | PRN
  Filled 2025-01-31: qty 15, 15d supply, fill #0

## 2025-01-31 NOTE — Progress Notes (Signed)
 "   Patient ID: Misty Bailey, female    DOB: Nov 16, 1971  MRN: [id number]  CC: Hypertension (HTN f/u./All over neck soreness - feels a pulling sensation when reaching for things x2 mo/Already received flu vax. Nurse visit scheduled for shingles vax. Pap with OBGYN)   Subjective: Misty Bailey is a 54 y.o. female who presents for chronic ds management. Her chronic medical issues include:  Dysmenorrhea, uterine fibroid, diverticulosis, HL, HTN, hidradenitis, bronchospasm, B12 def. PreDM   Discussed the use of AI scribe software for clinical note transcription with the patient, who gave verbal consent to proceed.  History of Present Illness Misty Bailey is a 54 year old female who presents for follow-up of her chronic medical conditions.  HTN: She has been inconsistently adhering to her blood pressure medications, amlodipine  10 mg and Cozaar  50 mg, due to a lack of routine and appetite fluctuations. She has not been regularly checking her blood pressure and did not take her medication this morning. She tries to limit salt intake and aims to cook more at home to control salt and sugar consumption. She is responsible for caring for her parents, which impacts her ability to maintain her own health routines.  HL: Regarding hyperlipidemia, she has been doubling her atorvastatin  10 mg to achieve a 20 mg dose. Her last LDL cholesterol was 143 mg/dL and I advised increase dose to 20 mg. She plans to switch her atorvastatin  intake to nighttime along with her depression medication.  For her B12 deficiency, she takes two gummies daily, each containing she thinks 500 mcg, totaling 1000 mcg per day. She has been consistent with this regimen.  She describes a neck issue persisting for about two months, characterized by soreness on both sides of the neck, particularly when turning her head. No initiating factors. The pain has lessened over time and does not affect swallowing. She uses a single, thick  pillow for sleeping.  MDD/Anxiety: She was plugged in with [facility]. Her depression is being managed with a half tablet of escitalopram 10 mg at night, which has helped reduce negative thoughts and anxiety. She has been attending therapy sessions, although recent appointments were disrupted by weather conditions. Her depression screening score has improved since the last visit.      Patient Active Problem List   Diagnosis Date Noted   PAD (peripheral artery disease) 08/13/2024   ASCUS with positive high risk HPV cervical 04/18/2024   Genitourinary syndrome of menopause 04/18/2024   Adhesive capsulitis of right shoulder 11/28/2023   B12 deficiency 11/28/2023   Neuropathy 06/22/2023   Inguinal hernia of left side without obstruction or gangrene 04/30/2020   Hyperlipidemia 07/30/2019   Syncope 07/29/2019   Essential hypertension 07/29/2019   Dysmenorrhea 05/08/2018   Herpes 03/21/2014   Suppurative hidradenitis    Depression      Medications Ordered Prior to Encounter[1]  Allergies[2]  Social History   Socioeconomic History   Marital status: Single    Spouse name: Not on file   Number of children: 0   Years of education: Associates   Highest education level: Associate degree: occupational, scientist, product/process development, or vocational program  Occupational History   Not on file  Tobacco Use   Smoking status: Former    Current packs/day: 0.00    Types: Cigarettes    Quit date: 1996    Years since quitting: 30.1   Smokeless tobacco: Never   Tobacco comments:    Weed  Vaping Use   Vaping status: Never  Used  Substance and Sexual Activity   Alcohol use: Yes    Comment: social   Drug use: Yes    Frequency: 3.0 times per week    Types: Marijuana   Sexual activity: Yes    Partners: Male    Birth control/protection: None  Other Topics Concern   Not on file  Social History Narrative   Boyfriend incarcerated 2014-2018   Social Drivers of Health   Tobacco Use: Medium Risk (01/31/2025)    Patient History    Smoking Tobacco Use: Former    Smokeless Tobacco Use: Never    Passive Exposure: Not on file  Financial Resource Strain: Medium Risk (10/23/2024)   Overall Financial Resource Strain (CARDIA)    Difficulty of Paying Living Expenses: Somewhat hard  Food Insecurity: Food Insecurity Present (10/23/2024)   Epic    Worried About Programme Researcher, Broadcasting/film/video in the Last Year: Sometimes true    Ran Out of Food in the Last Year: Sometimes true  Transportation Needs: No Transportation Needs (10/23/2024)   Epic    Lack of Transportation (Medical): No    Lack of Transportation (Non-Medical): No  Physical Activity: Insufficiently Active (10/23/2024)   Exercise Vital Sign    Days of Exercise per Week: 3 days    Minutes of Exercise per Session: 20 min  Stress: Stress Concern Present (10/23/2024)   [employer] of Occupational Health - Occupational Stress Questionnaire    Feeling of Stress: Rather much  Social Connections: Moderately Integrated (10/23/2024)   Social Connection and Isolation Panel    Frequency of Communication with Friends and Family: Three times a week    Frequency of Social Gatherings with Friends and Family: Three times a week    Attends Religious Services: More than 4 times per year    Active Member of Clubs or Organizations: Yes    Attends Banker Meetings: More than 4 times per year    Marital Status: Never married  Intimate Partner Violence: Not At Risk (11/10/2023)   Humiliation, Afraid, Rape, and Kick questionnaire    Fear of Current or Ex-Partner: No    Emotionally Abused: No    Physically Abused: No    Sexually Abused: No  Depression (PHQ2-9): High Risk (01/31/2025)   Depression (PHQ2-9)    PHQ-2 Score: 12  Alcohol Screen: Low Risk (10/23/2024)   Alcohol Screen    Last Alcohol Screening Score (AUDIT): 0  Housing: Low Risk (10/23/2024)   Epic    Unable to Pay for Housing in the Last Year: No    Number of Times Moved in the Last Year:  0    Homeless in the Last Year: No  Recent Concern: Housing - High Risk (09/16/2024)   Epic    Unable to Pay for Housing in the Last Year: Yes    Number of Times Moved in the Last Year: Not on file    Homeless in the Last Year: No  Utilities: Not At Risk (11/10/2023)   [employer] Utilities    Threatened with loss of utilities: No  Health Literacy: Adequate Health Literacy (11/10/2023)   B1300 Health Literacy    Frequency of need for help with medical instructions: Never    Family History  Problem Relation Age of Onset   Diabetes Mother    Hypertension Mother    Hyperlipidemia Mother    Heart murmur Mother    Crohn's disease Mother    Hypertension Father    Stroke Father    Hyperlipidemia Father  Diabetes Sister    Hypertension Sister    Hyperlipidemia Sister    Hypertension Maternal Grandmother    Diabetes Maternal Grandmother    Osteoporosis Maternal Grandmother    Cirrhosis Maternal Grandfather    Thyroid disease Paternal Aunt    Breast cancer Neg Hx     Past Surgical History:  Procedure Laterality Date   CHOLECYSTECTOMY N/A 06/28/2023   Procedure: LAPAROSCOPIC CHOLECYSTECTOMY;  Surgeon: Vernetta Berg, MD;  Location: [facility] OR;  Service: General;  Laterality: N/A;  TAP BLOCK   INGUINAL HERNIA REPAIR Left 06/28/2023   Procedure: OPEN LEFT INGUINAL HERNIA REPAIR WITH MESH;  Surgeon: Vernetta Berg, MD;  Location: [facility] OR;  Service: General;  Laterality: Left;   oral surgery     PELVIC LAPAROSCOPY  1998/1999 `   pelvic adhesions and pain   TOE SURGERY Left    great toe   WISDOM TOOTH EXTRACTION      ROS: Review of Systems Negative except as stated above  PHYSICAL EXAM: BP (!) 161/80   Pulse 83   Ht 5' 1 (1.549 m)   Wt 146 lb (66.2 kg)   SpO2 98%   BMI 27.59 kg/m   Physical Exam  General appearance - alert, well appearing, middle age AAF and in no distress Mental status - normal mood, behavior, speech, dress, motor activity, and thought processes Neck - supple,  no significant adenopathy.  No thyromegaly or thyroid nodules palpated. Chest - clear to auscultation, no wheezes, rales or rhonchi, symmetric air entry Heart - normal rate, regular rhythm, normal S1, S2, no murmurs, rubs, clicks or gallops Extremities - peripheral pulses normal, no pedal edema, no clubbing or cyanosis MSK: neck -good range of motion.  No point tenderness.  No abnormal masses or lymphadenopathy.     01/31/2025   10:42 AM 09/19/2024    9:46 AM 11/10/2023    3:58 PM  Depression screen PHQ 2/9  Decreased Interest 0 2 1  Down, Depressed, Hopeless 2 3 2   PHQ - 2 Score 2 5 3   Altered sleeping 2 2 3   Tired, decreased energy 2 2 3   Change in appetite 3 2 1   Feeling bad or failure about yourself  3 3 2   Trouble concentrating 0 0 0  Moving slowly or fidgety/restless 0 0 0  Suicidal thoughts 0 0 0  PHQ-9 Score 12 14  12    Difficult doing work/chores Somewhat difficult Somewhat difficult      Data saved with a previous flowsheet row definition      01/31/2025   10:42 AM 09/19/2024    9:46 AM 11/10/2023    3:58 PM 10/26/2023    2:34 PM  GAD 7 : Generalized Anxiety Score  Nervous, Anxious, on Edge 3 3  1  2    Control/stop worrying 3 3  3  3    Worry too much - different things 3 2  3  3    Trouble relaxing 3 1  3  3    Restless 0 0  0  0   Easily annoyed or irritable 2 1  0  1   Afraid - awful might happen 3 3  3  3    Total GAD 7 Score 17 13 13 15   Anxiety Difficulty Somewhat difficult Somewhat difficult  Not difficult at all     Data saved with a previous flowsheet row definition        Latest Ref Rng & Units 09/19/2024   11:40 AM 10/23/2023   11:46 AM  06/21/2023    2:00 PM  CMP  Glucose 70 - 99 mg/dL 91  892  898   BUN 6 - 24 mg/dL 14  15  15    Creatinine 0.57 - 1.00 mg/dL 8.95  9.03  8.90   Sodium 134 - 144 mmol/L 140  140  140   Potassium 3.5 - 5.2 mmol/L 4.7  3.8  3.6   Chloride 96 - 106 mmol/L 106  106  103   CO2 20 - 29 mmol/L 22  27  24    Calcium  8.7 -  10.2 mg/dL 9.6  9.6  9.7   Total Protein 6.0 - 8.5 g/dL 6.5     Total Bilirubin 0.0 - 1.2 mg/dL 0.3     Alkaline Phos 49 - 135 IU/L 99     AST 0 - 40 IU/L 16     ALT 0 - 32 IU/L 13      Lipid Panel     Component Value Date/Time   CHOL 224 (H) 09/19/2024 1140   TRIG 124 09/19/2024 1140   HDL 59 09/19/2024 1140   CHOLHDL 3.8 09/19/2024 1140   LDLCALC 143 (H) 09/19/2024 1140    CBC    Component Value Date/Time   WBC 5.8 09/19/2024 1140   WBC 6.1 10/23/2023 1146   RBC 4.83 09/19/2024 1140   RBC 4.72 10/23/2023 1146   HGB 13.7 09/19/2024 1140   HCT 45.0 09/19/2024 1140   PLT 252 09/19/2024 1140   MCV 93 09/19/2024 1140   MCH 28.4 09/19/2024 1140   MCH 28.6 10/23/2023 1146   MCHC 30.4 (L) 09/19/2024 1140   MCHC 32.0 10/23/2023 1146   RDW 13.1 09/19/2024 1140   LYMPHSABS 2.7 01/25/2018 1106   MONOABS 0.5 08/09/2013 1045   EOSABS 0.2 01/25/2018 1106   BASOSABS 0.0 01/25/2018 1106    ASSESSMENT AND PLAN: 1. Essential hypertension (Primary) Not at goal due to inconsistent medication adherence. I recommend Set phone alarm for medication reminders, Use a medication box that she refills every two-week and take all once-daily medications at night with atorvastatin . - Limit salt intake and reduce sugar consumption. - Advised to check blood pressure at least once a week with goal being 130/80 or lower.  2. Vitamin B 12 deficiency Patient taking vitamin B-12 supplement from over-the-counter.  She will send me a MyChart message to confirm the dose that she is taking.  3. Major depressive disorder, recurrent episode, mild 4. GAD (generalized anxiety disorder) She is plugged in with [facility] and has been started on low-dose Lexapro which she finds helpful by history.  PHQ9 score has decreased since last visit but GAD-7 score has increased.  She will continue the Lexapro and follow-up with her behavioral health specialist.  5. Mixed hyperlipidemia Continue atorvastatin  20 mg daily.   Best to take in the evenings.  6. Neck pain I suspect most likely muscle spasms.  Advised that she ensures that she is sleeping on a comfortable pillow.  We will try her with Robaxin .  Advised that the medication can cause drowsiness. - methocarbamol  (ROBAXIN ) 500 MG tablet; Take 1 tablet (500 mg total) by mouth daily as needed for muscle spasms.  Dispense: 15 tablet; Refill: 0    Patient was given the opportunity to ask questions.  Patient verbalized understanding of the plan and was able to repeat key elements of the plan.   This documentation was completed using Paediatric nurse.  Any transcriptional errors are unintentional.  No orders  of the defined types were placed in this encounter.    Requested Prescriptions   Signed Prescriptions Disp Refills   methocarbamol  (ROBAXIN ) 500 MG tablet 15 tablet 0    Sig: Take 1 tablet (500 mg total) by mouth daily as needed for muscle spasms.    Return in about 4 months (around 05/31/2025).  Barnie Louder, MD, FACP     [1]  Current Outpatient Medications on File Prior to Visit  Medication Sig Dispense Refill   albuterol  (VENTOLIN  HFA) 108 (90 Base) MCG/ACT inhaler Inhale 2 puffs into the lungs every 6 (six) hours as needed for wheezing or shortness of breath. 6.7 g 4   amLODipine  (NORVASC ) 10 MG tablet Take 1 tablet (10 mg total) by mouth daily. 90 tablet 1   atorvastatin  (LIPITOR) 20 MG tablet Take 1 tablet (20 mg total) by mouth daily. 90 tablet 1   desonide (DESOWEN) 0.05 % ointment Apply 1 Application topically daily as needed (rash).     escitalopram (LEXAPRO) 10 MG tablet Take 5 mg by mouth at bedtime.     estradiol  (ESTRACE  VAGINAL) 0.1 MG/GM vaginal cream Apply 1/2 gram to vulva nightly for 2 weeks then decrease to 1/2 gram to vulva two nights a week. Do not use applicator. 42.5 g 1   fluticasone  (FLONASE ) 50 MCG/ACT nasal spray Place 1 spray into both nostrils daily. 16 g 1   gabapentin  (NEURONTIN ) 100 MG  capsule Take 100 mg by mouth as needed.     losartan  (COZAAR ) 50 MG tablet Take 1 tablet (50 mg total) by mouth daily. 30 tablet 6   Current Facility-Administered Medications on File Prior to Visit  Medication Dose Route Frequency Provider Last Rate Last Admin   cyanocobalamin  (VITAMIN B12) injection 1,000 mcg  1,000 mcg Intramuscular Once       [2]  Allergies Allergen Reactions   Codeine  Shortness Of Breath   "

## 2025-01-31 NOTE — Patient Instructions (Signed)
" °  VISIT SUMMARY: Today, we discussed the management of your chronic medical conditions, including hypertension, hyperlipidemia, vitamin B12 deficiency, depression, and neck pain. We also reviewed your general health maintenance needs.  YOUR PLAN: -ESSENTIAL HYPERTENSION: Hypertension is high blood pressure. Your blood pressure was elevated today at 148/74 mmHg, likely due to inconsistent medication adherence. To help manage this, set a phone alarm for medication reminders, use a medication box for a two-week supply, and take all your once-daily medications at night with atorvastatin . Continue to limit salt intake and reduce sugar consumption. Follow up in 4 months, or sooner if problems arise.  -MIXED HYPERLIPIDEMIA: Hyperlipidemia is high cholesterol. Your LDL cholesterol was elevated at 143 mg/dL, likely due to inconsistent atorvastatin  adherence. Continue taking atorvastatin  20 mg daily, either by doubling your 10 mg tablets or switching to 20 mg tablets when your current supply is exhausted.  -VITAMIN B12 DEFICIENCY: Vitamin B12 deficiency means you have low levels of vitamin B12. You are currently taking over-the-counter B12 gummies. Please send a MyChart message to confirm the exact dosage of your B12 gummies.  -MAJOR DEPRESSIVE DISORDER, RECURRENT, MILD: Major depressive disorder is a mental health condition characterized by persistent feelings of sadness and loss of interest. Your symptoms have improved with nortriptyline 10 mg at night. Continue taking nortriptyline 10 mg at night and reschedule your therapy sessions as soon as possible.  -NECK PAIN: You have been experiencing bilateral neck pain for two months, which has decreased but persists with certain movements. You have been prescribed a muscle relaxant to be taken as needed, preferably at night. Be aware that the muscle relaxant may cause drowsiness, so avoid driving or operating machinery after taking it.  -GENERAL HEALTH MAINTENANCE:  You plan to receive the shingles vaccine and need to reschedule your Pap smear appointment. You were counseled on possible arm soreness after the shingles vaccine, which will be administered on February 17th. Please reschedule your Pap smear appointment with your gynecologist.  INSTRUCTIONS: Follow up in 4 months for hypertension management, or sooner if problems arise. Continue taking atorvastatin  20 mg daily for hyperlipidemia. Send a MyChart message to confirm the exact dosage of your B12 gummies. Reschedule your therapy sessions for depression management. Avoid driving or operating machinery after taking the muscle relaxant for neck pain. Administer the shingles vaccine on February 17th and reschedule your Pap smear appointment with your gynecologist.    Contains text generated by [employer].   "

## 2025-02-10 ENCOUNTER — Ambulatory Visit: Payer: Self-pay

## 2025-06-09 ENCOUNTER — Ambulatory Visit: Payer: Self-pay | Admitting: Internal Medicine
